# Patient Record
Sex: Male | Born: 1935 | Race: White | Hispanic: No | Smoking: Former smoker
Health system: Southern US, Community
[De-identification: ages and names within clinical notes are randomized; demographics above are authoritative.]

## PROBLEM LIST (undated history)

## (undated) DIAGNOSIS — I639 Cerebral infarction, unspecified: Secondary | ICD-10-CM

## (undated) DIAGNOSIS — I739 Peripheral vascular disease, unspecified: Secondary | ICD-10-CM

## (undated) DIAGNOSIS — J449 Chronic obstructive pulmonary disease, unspecified: Secondary | ICD-10-CM

## (undated) DIAGNOSIS — C801 Malignant (primary) neoplasm, unspecified: Secondary | ICD-10-CM

## (undated) DIAGNOSIS — N183 Chronic kidney disease, stage 3 unspecified: Secondary | ICD-10-CM

## (undated) DIAGNOSIS — K219 Gastro-esophageal reflux disease without esophagitis: Secondary | ICD-10-CM

## (undated) DIAGNOSIS — I701 Atherosclerosis of renal artery: Secondary | ICD-10-CM

## (undated) DIAGNOSIS — K449 Diaphragmatic hernia without obstruction or gangrene: Secondary | ICD-10-CM

## (undated) DIAGNOSIS — E785 Hyperlipidemia, unspecified: Secondary | ICD-10-CM

## (undated) DIAGNOSIS — I10 Essential (primary) hypertension: Secondary | ICD-10-CM

## (undated) DIAGNOSIS — Z8719 Personal history of other diseases of the digestive system: Secondary | ICD-10-CM

## (undated) DIAGNOSIS — M1990 Unspecified osteoarthritis, unspecified site: Secondary | ICD-10-CM

## (undated) DIAGNOSIS — I219 Acute myocardial infarction, unspecified: Secondary | ICD-10-CM

## (undated) HISTORY — DX: Peripheral vascular disease, unspecified: I73.9

## (undated) HISTORY — DX: Personal history of other diseases of the digestive system: Z87.19

## (undated) HISTORY — DX: Unspecified osteoarthritis, unspecified site: M19.90

## (undated) HISTORY — DX: Chronic obstructive pulmonary disease, unspecified: J44.9

## (undated) HISTORY — DX: Essential (primary) hypertension: I10

## (undated) HISTORY — DX: Diaphragmatic hernia without obstruction or gangrene: K44.9

## (undated) HISTORY — DX: Atherosclerosis of renal artery: I70.1

## (undated) HISTORY — DX: Gastro-esophageal reflux disease without esophagitis: K21.9

## (undated) HISTORY — DX: Hyperlipidemia, unspecified: E78.5

## (undated) HISTORY — DX: Cerebral infarction, unspecified: I63.9

## (undated) HISTORY — DX: Acute myocardial infarction, unspecified: I21.9

---

## 2000-02-19 ENCOUNTER — Encounter: Payer: Self-pay | Admitting: Cardiology

## 2000-02-19 ENCOUNTER — Ambulatory Visit (HOSPITAL_COMMUNITY): Admission: RE | Admit: 2000-02-19 | Discharge: 2000-02-19 | Payer: Self-pay | Admitting: Cardiology

## 2004-04-05 ENCOUNTER — Encounter: Admission: RE | Admit: 2004-04-05 | Discharge: 2004-04-05 | Payer: Self-pay | Admitting: Sports Medicine

## 2004-04-18 ENCOUNTER — Encounter: Admission: RE | Admit: 2004-04-18 | Discharge: 2004-04-18 | Payer: Self-pay | Admitting: Sports Medicine

## 2009-07-09 ENCOUNTER — Emergency Department (HOSPITAL_COMMUNITY): Admission: EM | Admit: 2009-07-09 | Discharge: 2009-07-09 | Payer: Self-pay | Admitting: Emergency Medicine

## 2009-07-09 ENCOUNTER — Inpatient Hospital Stay (HOSPITAL_COMMUNITY)
Admission: EM | Admit: 2009-07-09 | Discharge: 2009-07-14 | Payer: Self-pay | Source: Home / Self Care | Admitting: Emergency Medicine

## 2009-07-10 ENCOUNTER — Encounter: Payer: Self-pay | Admitting: Internal Medicine

## 2009-07-10 ENCOUNTER — Ambulatory Visit: Payer: Self-pay | Admitting: Internal Medicine

## 2009-08-14 ENCOUNTER — Ambulatory Visit: Payer: Self-pay | Admitting: Pulmonary Disease

## 2009-08-14 DIAGNOSIS — I4891 Unspecified atrial fibrillation: Secondary | ICD-10-CM | POA: Insufficient documentation

## 2009-08-14 DIAGNOSIS — I219 Acute myocardial infarction, unspecified: Secondary | ICD-10-CM | POA: Insufficient documentation

## 2009-08-14 DIAGNOSIS — I10 Essential (primary) hypertension: Secondary | ICD-10-CM | POA: Insufficient documentation

## 2009-08-14 DIAGNOSIS — E785 Hyperlipidemia, unspecified: Secondary | ICD-10-CM

## 2009-08-14 DIAGNOSIS — J449 Chronic obstructive pulmonary disease, unspecified: Secondary | ICD-10-CM | POA: Insufficient documentation

## 2009-08-14 DIAGNOSIS — Z87891 Personal history of nicotine dependence: Secondary | ICD-10-CM

## 2009-08-14 DIAGNOSIS — J4489 Other specified chronic obstructive pulmonary disease: Secondary | ICD-10-CM | POA: Insufficient documentation

## 2009-10-15 ENCOUNTER — Ambulatory Visit: Payer: Self-pay | Admitting: Pulmonary Disease

## 2010-03-18 ENCOUNTER — Ambulatory Visit
Admission: RE | Admit: 2010-03-18 | Discharge: 2010-03-18 | Payer: Self-pay | Source: Home / Self Care | Attending: Vascular Surgery | Admitting: Vascular Surgery

## 2010-03-28 ENCOUNTER — Ambulatory Visit
Admission: RE | Admit: 2010-03-28 | Discharge: 2010-03-28 | Payer: Self-pay | Source: Home / Self Care | Attending: Vascular Surgery | Admitting: Vascular Surgery

## 2010-03-29 NOTE — Assessment & Plan Note (Signed)
OFFICE VISIT  [name], Martin P DOB:  12-05-1930                                       03/28/2010 UVOZD#:[id number]  CHIEF COMPLAINT:  Leg pain.  HISTORY OF PRESENT ILLNESS:  The patient is a 77 year old male referred by Dr. Margaretha Sheffield for evaluation of lower extremity pain.  The patient states his legs hurt after walking approximately 10 yards.  The right leg is worse than the left.  This has been going on for 8 months to a year.  He states that the pain immediately resolves and improves with sitting for approximately 5 minutes.  He can then walk another 10 yards.  He was previously seen by Dr. Margaretha Sheffield for evaluation for possible lumbar spine cause of this problem.  He had an MRI of the spine which showed some lumbar degenerative disk disease but no foraminal or spinal stenosis.  He has had no episodes of nonhealing ulcers on his feet.  He has some component of early rest pain in his right foot and sometimes has to dangle the foot out of the bed to decrease pain at night time when he is sleeping.  He also has some decreased sensation intermittently in his right foot.  CHRONIC MEDICAL PROBLEMS:  Include hypertension, elevated cholesterol, coronary artery disease and what the patient described as "moderate COPD".  SOCIAL HISTORY:  He is a former smoker, quit in May of 2011.  In May of 2011 he also had coronary stenting by Dr. Sharyn Lull.  These were drug- eluting stents and he was placed on Effient at that time.  He continues to take Effient and aspirin currently.  He was thought to be a nonresponder of Plavix.  All of his chronic medical problems are currently stable and followed by Dr. Sharyn Lull.  PAST SURGICAL HISTORY:  None.  SOCIAL HISTORY:  He is retired.  He is widowed.  Smoking history as listed above.  He does not consume alcohol regularly.  FAMILY HISTORY:  Is not remarkable for vascular disease at age less than 1 or other significant medical  problems.  REVIEW OF SYSTEMS:  Full 12 point review of systems was performed with the patient today.  Please see intake referral form for details regarding this.  MEDICATIONS: 1. Include Symbicort 160/4.5. 2. Aspirin 81 mg once a day. 3. Metoprolol ER 25 mg once a day. 4. Effient 10 mg once a day. 5. Alprazolam 0.5 mg b.i.d. p.r.n. 6. Famotidine 20 mg twice a day. 7. Diltiazem 120 mg once a day. 8. Cozaar 50 mg once daily.  ALLERGIES:  He has an allergy listed to penicillin but does not remember the allergic reaction he had to this.  PHYSICAL EXAM:  Vital signs:  Blood pressure is 180/78 in the left arm, oxygen saturation is 99% on room air, heart rate 69 and regular.  HEENT: Unremarkable.  Neck:  Has 2+ carotid pulses with a left-sided faint carotid bruit.  Chest:  Clear to auscultation in the upper fields. However, there are coarse rhonchi bilaterally in the bases.  There is no audible wheeze.  Cardiac:  Regular rate and rhythm without murmur. Abdomen:  Soft, nontender, nondistended.  No masses.  Musculoskeletal: Showed no major obvious joint deformities.  Neurologic:  Shows slight decreased sensation to the plantar aspect of the right foot.  Skin:  Has no open ulcers or rashes.  Extremities:  He has 2+ radial pulses bilaterally.  He has 1+ femoral pulses bilaterally.  He has absent popliteal and pedal pulses bilaterally.  The right foot is slightly dusky in appearance.  There is capillary refill.  I reviewed his ABIs performed in our office on 03/18/2010 which showed absent Doppler flow to the right foot and an ABI of 0.40 to the left foot with monophasic waveforms.  In summary, the patient has severe lower extremity arterial occlusive disease and is at risk of limb loss bilaterally.  I believe the best option at this point would be to perform a diagnostic arteriogram with possible intervention with angioplasty and stenting with a focus on the right leg but also obtaining  images of the left lower extremity.  I spoke with the patient today and Dr. Sharyn Lull today regarding his Effient.  If we were able to do just a percutaneous intervention we would not need to make any medication changes.  However, if his arteriogram suggests that an open operation would be necessary then we will need to admit him a few days before the procedure to place him on Integrilin and I will have Dr. Sharyn Lull assist with this and then we would need to place him on Effient again after the operation.  This would all be due to the fact that he has a drug-eluting stent in his LAD that would put him at risk without these medications.  In addition, today the patient and his friend were informed of the risks, benefits, possible complications and procedure details of the aortogram with lower extremity runoff, possible angioplasty and stenting.  They understand and agree to proceed.  This is scheduled for 04/05/2010.    Janetta Hora. Fields, MD Electronically Signed  CEF/MEDQ  D:  03/28/2010  T:  03/29/2010  Job:  [id number]  cc:   Eduardo Osier. Sharyn Lull, M.D. Frazier Butt, D.O.

## 2010-04-05 ENCOUNTER — Ambulatory Visit (HOSPITAL_COMMUNITY)
Admission: RE | Admit: 2010-04-05 | Discharge: 2010-04-05 | Payer: Self-pay | Source: Home / Self Care | Attending: Vascular Surgery | Admitting: Vascular Surgery

## 2010-04-05 LAB — POCT I-STAT, CHEM 8
BUN: 19 mg/dL (ref 6–23)
Creatinine, Ser: 1.6 mg/dL — ABNORMAL HIGH (ref 0.4–1.5)
Glucose, Bld: 142 mg/dL — ABNORMAL HIGH (ref 70–99)
TCO2: 29 mmol/L (ref 0–100)

## 2010-04-05 NOTE — Procedures (Unsigned)
CAROTID DUPLEX EXAM  INDICATION:  Carotid bruit.  HISTORY: Diabetes:  Yes. Cardiac:  Myocardial infarction. Hypertension:  Yes. Smoking:  Previous. Previous Surgery:  No. CV History:  Patient is currently asymptomatic. Amaurosis Fugax No, Paresthesias No, Hemiparesis No.                                      RIGHT             LEFT Brachial systolic pressure:         166               168 Brachial Doppler waveforms:         WNL               WNL Vertebral direction of flow:        Antegrade         Antegrade DUPLEX VELOCITIES (cm/sec) CCA peak systolic                   76                69 ECA peak systolic                   85                161 ICA peak systolic                   92                152 ICA end diastolic                   19                18 PLAQUE MORPHOLOGY:                  Heterogenous      Heterogenous PLAQUE AMOUNT:                      Mild-to-moderate  Mild-to-moderate PLAQUE LOCATION:                    Bifurcation, proximal ICA           Bifurcation, proximal ICA, ECA  IMPRESSION: 1. The right side is not hemodynamically significant for stenosis. 2. The left side has 40% to 59% stenosis in the internal carotid     artery. 3. The left external carotid artery with stenosis. 4. Minimal bilateral intimal thickening within the common carotid     arteries.  ___________________________________________ Janetta Hora Fields, MD  OD/MEDQ  D:  03/28/2010  T:  03/28/2010  Job:  [id number]

## 2010-04-09 NOTE — Assessment & Plan Note (Signed)
Summary: rov 6-8 wks ///kp   Visit Type:  Follow-up Copy to:  Dr. Sharyn Lull Primary Provider/Referring Provider:  Rinaldo Cloud  CC:  COPD.  The patient says there is no change in his breathing since adding Spiriva. Occasional cough..  History of Present Illness: 77 yo male with GOLD 3 COPD.  His breathing has been doing okay.  He still has a cough with clear to green sputum.  He denies fever, sweats, chest pain, or hemoptysis.  He has not had wheeze or sinus congestion.  He has not smoked since his last hospitalization.  He has been using spiriva, but he is not sure how much this is helping.  He is worried about the side effects from spiriva.  He has not needed to use his proair much since he started symbicort.   Current Medications (verified): 1)  Aspirin 325 Mg Tabs (Aspirin) .... Once Daily 2)  Diltiazem Hcl 120 Mg Tabs (Diltiazem Hcl) .... Once Daily 3)  Metoprolol Succinate 25 Mg Xr24h-Tab (Metoprolol Succinate) .... Once Daily 4)  Losartan Potassium 50 Mg Tabs (Losartan Potassium) .... Once Daily 5)  Crestor 20 Mg Tabs (Rosuvastatin Calcium) .... Once Daily 6)  Pepcid 20 Mg Tabs (Famotidine) .... 2 By Mouth Daily 7)  Alprazolam 0.5 Mg Tabs (Alprazolam) .... 1/2 By Mouth At Bedtime 8)  Effient 10 Mg Tabs (Prasugrel Hcl) .... Once Daily 9)  Symbicort 160-4.5 Mcg/act Aero (Budesonide-Formoterol Fumarate) .... Two Puffs Two Times A Day 10)  Proair Hfa 108 (90 Base) Mcg/act Aers (Albuterol Sulfate) .... Two Puffs Up To Four Times Per Day As Needed 11)  Spiriva Handihaler 18 Mcg Caps (Tiotropium Bromide Monohydrate) .... One Puff Once Daily  Allergies (verified): 1)  ! * Pcn  Past History:  Past Medical History: Reviewed history from 08/14/2009 and no changes required. Coronary artery disease s/p MI Hypertension Grade 1 diastolic dysfunction Chronic obstructive pulmonary disease      - Spirometry 08/14/09 FEV1 1.16(46%), FEV1% 55 Diabetes mellitus, type  II Hyperlipidemia L4-L5 disk protrusion  Past Surgical History: Reviewed history from 08/14/2009 and no changes required. Cardiac catheterization with angioplasty and stenting May 2011  Vital Signs:  Patient profile:   77 year old male Height:      66 inches (167.64 cm) Weight:      157.50 pounds (71.59 kg) BMI:     25.51 O2 Sat:      93 % on Room air Temp:     97.9 degrees F (36.61 degrees C) oral Pulse rate:   67 / minute BP sitting:   124 / 78  (left arm) Cuff size:   regular  Vitals Entered By: Michel Bickers CMA (October 15, 2009 4:52 PM)  O2 Sat at Rest %:  93 O2 Flow:  Room air CC: COPD.  The patient says there is no change in his breathing since adding Spiriva. Occasional cough. Comments Medications reviewed. Daytime phone verified. Michel [facility]  October 15, 2009 4:53 PM   Physical Exam  General:  obese.   Nose:  no deformity, discharge, inflammation, or lesions Mouth:  no deformity or lesions Neck:  no JVD.   Lungs:  diminished breath sounds, no wheezing or rales Heart:  regular rhythm, normal rate, and no murmurs.   Extremities:  no clubbing, cyanosis, edema, or deformity noted Cervical Nodes:  no significant adenopathy   Impression & Recommendations:  Problem # 1:  COPD (ICD-496) He has been doing well.  He has not noticed any difference  since starting spiriva.  I have advised him to stop spiriva, and monitor his symptoms.  He is to continue symbicort and as needed proair.  I advised him to call if his breathing gets worse, or if he needs to use his rescue inhaler more.  Medications Added to Medication List This Visit: 1)  Alprazolam 0.5 Mg Tabs (Alprazolam) .... 1/2 by mouth at bedtime  Complete Medication List: 1)  Aspirin 325 Mg Tabs (Aspirin) .... Once daily 2)  Diltiazem Hcl 120 Mg Tabs (Diltiazem hcl) .... Once daily 3)  Metoprolol Succinate 25 Mg Xr24h-tab (Metoprolol succinate) .... Once daily 4)  Losartan Potassium 50 Mg Tabs (Losartan  potassium) .... Once daily 5)  Crestor 20 Mg Tabs (Rosuvastatin calcium) .... Once daily 6)  Pepcid 20 Mg Tabs (Famotidine) .... 2 by mouth daily 7)  Alprazolam 0.5 Mg Tabs (Alprazolam) .... 1/2 by mouth at bedtime 8)  Effient 10 Mg Tabs (Prasugrel hcl) .... Once daily 9)  Symbicort 160-4.5 Mcg/act Aero (Budesonide-formoterol fumarate) .... Two puffs two times a day 10)  Proair Hfa 108 (90 Base) Mcg/act Aers (Albuterol sulfate) .... Two puffs up to four times per day as needed  Other Orders: Est. Patient Level III (09811)  Patient Instructions: 1)  Stop spiriva 2)  Continue symbicort two puffs two times a day 3)  Continue proair two puffs up to four times per day as needed  4)  Follow up in 4 to 6 months

## 2010-04-09 NOTE — Assessment & Plan Note (Signed)
Summary: copd/jd   Visit Type:  Initial Consult Copy to:  Dr. Sharyn Lull Primary Provider/Referring Provider:  Rinaldo Cloud  CC:  Pulmonary consult.  Marland Kitchen  History of Present Illness: 77 yo male for evaluation of COPD.  He was hospitalized in May for dyspnea, and was found to have a heart attack.  He then had cardiac cath with stenting.  He was also told he had a COPD and had an exacerbation contributing to his dyspnea.  He was discharged on prednisone taper, and albuterol.  He has since completed his prednisone.  He has notice a problem with his breathing for years.  He has not seen a doctor in the past 10 o 15 years prior to his hospitalization in May.  He has an extensive history of smoking.  He started at age 20, smoked at least 1 pack per day, and quit the day he went to the hospital in May.  Prior to this he was getting frequent cough, wheeze, and clear sputum production.  This has improved some with inhaler therapy and smoking cessation.  He was using his proair frequently, and as a result was started on symbicort about two weeks ago.  This has helped some.  He has not had hemoptysis or fever.  There is no prior history of allergies, asthma, pneumonia, or TB.  He used to working in a [employer], and was exposed to chemicals in his work place.  He has a Development worker, international aid, but denies other animal exposures.  There is no travel history or sick exposures.     Preventive Screening-Counseling & Management  Alcohol-Tobacco     Alcohol drinks/day: 0     Smoking Status: quit     Year Quit: 07/2009     Pack years: 60 years x1 ppd  Current Medications (verified): 1)  Spiriva Handihaler 18 Mcg Caps (Tiotropium Bromide Monohydrate) .... Once Daily 2)  Aspirin 325 Mg Tabs (Aspirin) .... Once Daily 3)  Diltiazem Hcl 120 Mg Tabs (Diltiazem Hcl) .... Once Daily 4)  Metoprolol Succinate 25 Mg Xr24h-Tab (Metoprolol Succinate) .... Once Daily 5)  Pepcid 20 Mg Tabs (Famotidine) .... 2 By Mouth Daily 6)   Crestor 20 Mg Tabs (Rosuvastatin Calcium) .... Once Daily 7)  Alprazolam 0.5 Mg Tabs (Alprazolam) .Marland Kitchen.. 1 By Mouth Two Times A Day As Needed 8)  Losartan Potassium 50 Mg Tabs (Losartan Potassium) .... Once Daily 9)  Effient 10 Mg Tabs (Prasugrel Hcl) .... Once Daily 10)  Ventolin Hfa 108 (90 Base) Mcg/act  Aers (Albuterol Sulfate) .Marland Kitchen.. 1-2 Puffs Every 4-6 Hours As Needed  Allergies (verified): 1)  ! * Pcn  Past History:  Past Medical History: Coronary artery disease s/p MI Hypertension Grade 1 diastolic dysfunction Chronic obstructive pulmonary disease      - Spirometry 08/14/09 FEV1 1.16(46%), FEV1% 55 Diabetes mellitus, type II Hyperlipidemia L4-L5 disk protrusion  Past Surgical History: Cardiac catheterization with angioplasty and stenting May 2011  Echocardiogram  Procedure date:  07/10/2009  Findings:       Study Conclusions    - Left ventricle: The cavity size was normal. Systolic function was     normal. The estimated ejection fraction was in the range of 50% to     55%. Regional wall motion abnormalities cannot be excluded.     Doppler parameters are consistent with abnormal left ventricular     relaxation (grade 1 diastolic dysfunction).   - Atrial septum: No defect or patent foramen ovale was identified.  CXR  Procedure  date:  07/12/2009  Findings:       PORTABLE CHEST - 1 VIEW    Comparison: 07/09/2009    Findings: Heart size is mildly enlarged.    No pleural effusions are identified.    Kerley B lines are identified along the periphery of the left lung   which suggests interstitial edema.    No airspace consolidation is identified.    IMPRESSION:    1.  Suspect early interstitial edema.   2.  No pneumonia.  Cardiac Cath  Procedure date:  07/12/2009  Findings:       PROCEDURES:   1. Left cardiac catheterization with selective left and right coronary       angiography, left ventriculography via right groin using Judkins       technique.    2. Successful percutaneous transluminal coronary angioplasty to apical       and distal left anterior descending using 2.0 x 12 mm long Mini-       Trek balloon.   3. Successful percutaneous transluminal coronary angioplasty to       proximal diagonal 2 using 2.5 x 15 mm long Mini-Trek balloon.   4. Successful deployment of 2.5 x 23 mm long Promus drug-eluting stent       in proximal and ostial diagonal 2 using mini-crush technique.   5. Successful postdilatation of this stent using 2.5 x 15 mm long [place]       Voyager balloon.   6. Successful percutaneous transluminal coronary angioplasty to       proximal left anterior descending using 2.5 x 12 mm long Mini-Trek       balloon.   7. Successful deployment of 3.0 x 23 mm long Promus drug-eluting stent       in proximal left anterior descending .   8. Successful postdilatation of this stent using 3.0 x 20 mm long [place]       Voyager balloon.    Family History: Heart disease--mother  Social History: Quit smoking May 2011. Retired from [employer]--chemical exposure over the years Widowed Lives aloneAlcohol drinks/day:  0 Smoking Status:  quit Pack years:  60 years x1 ppd  Review of Systems       The patient complains of shortness of breath with activity, shortness of breath at rest, productive cough, irregular heartbeats, acid heartburn, indigestion, and nasal congestion/difficulty breathing through nose.  The patient denies non-productive cough, coughing up blood, chest pain, loss of appetite, weight change, abdominal pain, difficulty swallowing, sore throat, tooth/dental problems, headaches, sneezing, itching, ear ache, anxiety, depression, hand/feet swelling, joint stiffness or pain, rash, change in color of mucus, and fever.    Vital Signs:  Patient profile:   77 year old male Height:      66 inches (251.46 cm) Weight:      148 pounds (67.27 kg) BMI:     10.66 O2 Sat:      96 % on Room air Temp:     97.4 degrees F (36.33 degrees C)  oral Pulse rate:   69 / minute BP sitting:   136 / 64  (left arm) Cuff size:   regular  Vitals Entered By: Michel Bickers CMA (August 14, 2009 2:36 PM)  O2 Sat at Rest %:  96 O2 Flow:  Room air CC: Pulmonary consult.   Is Patient Diabetic? No   Physical Exam  General:  obese.   Eyes:  PERRLA and EOMI.   Nose:  no deformity, discharge, inflammation, or lesions  Mouth:  no deformity or lesions Neck:  no JVD.   Chest Wall:  no deformities noted Lungs:  diminished breath sounds, no wheezing or rales Heart:  regular rhythm, normal rate, and no murmurs.   Abdomen:  obese, soft, non-tender, normal bowel sounds Msk:  no deformity or scoliosis noted with normal posture Pulses:  pulses normal Extremities:  no clubbing, cyanosis, edema, or deformity noted Neurologic:  CN II-XII grossly intact with normal reflexes, coordination, muscle strength and tone Cervical Nodes:  no significant adenopathy Psych:  alert and cooperative; normal mood and affect; normal attention span and concentration   Pulmonary Function Test Date: 08/14/2009 3:19 PM Gender: Male  Pre-Spirometry FVC    Value: 2.11 L/min   % Pred: 60.20 % FEV1    Value: 1.16 L     Pred: 2.50 L     % Pred: 46.50 % FEV1/FVC  Value: 55.15 %     % Pred: 76.20 %  Comments: Severe obstruction  Impression & Recommendations:  Problem # 1:  COPD (ICD-496) He has an extensive history of smoking.  He has airflow obstruction on spirometry today.  He has felt partial improvement in his breathing from inhaler therapy.  I will continue him on symbicort and as needed albuterol.  I will add spiriva.  I don't think he needs additional prednisone or antibiotics at this time.  He has received his pneumonia shot, and does get annual flu vaccination.  I have advised him to consider enrolling in pulmonary rehab.  Problem # 2:  TOBACCO ABUSE (ICD-305.1)  He quit after his recent hospitalization.  I have encouraged him to maintain his smoking  abstinence.  Medications Added to Medication List This Visit: 1)  Spiriva Handihaler 18 Mcg Caps (Tiotropium bromide monohydrate) .... Once daily 2)  Ventolin Hfa 108 (90 Base) Mcg/act Aers (Albuterol sulfate) .Marland Kitchen.. 1-2 puffs every 4-6 hours as needed 3)  Aspirin 325 Mg Tabs (Aspirin) .... Once daily 4)  Diltiazem Hcl 120 Mg Tabs (Diltiazem hcl) .... Once daily 5)  Metoprolol Succinate 25 Mg Xr24h-tab (Metoprolol succinate) .... Once daily 6)  Losartan Potassium 50 Mg Tabs (Losartan potassium) .... Once daily 7)  Crestor 20 Mg Tabs (Rosuvastatin calcium) .... Once daily 8)  Pepcid 20 Mg Tabs (Famotidine) .... 2 by mouth daily 9)  Alprazolam 0.5 Mg Tabs (Alprazolam) .Marland Kitchen.. 1 by mouth two times a day as needed 10)  Effient 10 Mg Tabs (Prasugrel hcl) .... Once daily 11)  Symbicort 160-4.5 Mcg/act Aero (Budesonide-formoterol fumarate) .... Two puffs two times a day 12)  Proair Hfa 108 (90 Base) Mcg/act Aers (Albuterol sulfate) .... Two puffs up to four times per day as needed 13)  Spiriva Handihaler 18 Mcg Caps (Tiotropium bromide monohydrate) .... One puff once daily  Complete Medication List: 1)  Aspirin 325 Mg Tabs (Aspirin) .... Once daily 2)  Diltiazem Hcl 120 Mg Tabs (Diltiazem hcl) .... Once daily 3)  Metoprolol Succinate 25 Mg Xr24h-tab (Metoprolol succinate) .... Once daily 4)  Losartan Potassium 50 Mg Tabs (Losartan potassium) .... Once daily 5)  Crestor 20 Mg Tabs (Rosuvastatin calcium) .... Once daily 6)  Pepcid 20 Mg Tabs (Famotidine) .... 2 by mouth daily 7)  Alprazolam 0.5 Mg Tabs (Alprazolam) .Marland Kitchen.. 1 by mouth two times a day as needed 8)  Effient 10 Mg Tabs (Prasugrel hcl) .... Once daily 9)  Symbicort 160-4.5 Mcg/act Aero (Budesonide-formoterol fumarate) .... Two puffs two times a day 10)  Proair Hfa 108 (90 Base) Mcg/act Aers (Albuterol  sulfate) .... Two puffs up to four times per day as needed 11)  Spiriva Handihaler 18 Mcg Caps (Tiotropium bromide monohydrate) .... One puff  once daily  Other Orders: Consultation Level IV (16109) Prescription Created Electronically [phone]) Spirometry w/Graph [phone])  Patient Instructions: 1)  Symbicort two puffs two times a day 2)  Proair two puffs up to four times per day as needed for cough, wheeze, chest congestion, or shortness of breath 3)  Start spiriva one puff once daily  4)  Think about pulmonary rehab 5)  Follow up in 6 to 8 weeks Prescriptions: SPIRIVA HANDIHALER 18 MCG CAPS (TIOTROPIUM BROMIDE MONOHYDRATE) one puff once daily  #30 x 3   Entered and Authorized by:   Coralyn Helling MD   Signed by:   Coralyn Helling MD on 08/14/2009   Method used:   Electronically to        [facility]  [phone]* (retail)       [address]       [place], [place]  [address]       Ph: [phone] or [phone]       Fax: [phone]   RxID:   [id number]    Immunization History:  Influenza Immunization History:    Influenza:  historical (12/08/2008)  Pneumovax Immunization History:    Pneumovax:  historical (12/08/2008)    CardioPerfect Spirometry  ID: [id number] Patient: Street, Flor P DOB: Dec 19, 1930 Age: 78 Years Old Sex: Male Race: White Physician: Aria Pickrell Height: 66 Weight: 148 Status: Confirmed Recorded: 08/14/2009 3:19 PM  Parameter  Measured Predicted %Predicted FVC     2.11        3.50        60.20 FEV1     1.16        2.50        46.50 FEV1%   55.15        72.36        76.20 PEF    2.91        6.79        42.90   Interpretation: Pre: FVC= 2.11L FEV1= 1.16L FEV1%= 55.2% 1.16/2.11 FEV1/FVC (08/14/2009 3:22:23 PM), Severe obstruction.

## 2010-04-10 ENCOUNTER — Inpatient Hospital Stay (HOSPITAL_COMMUNITY): Payer: Medicare Other

## 2010-04-10 ENCOUNTER — Inpatient Hospital Stay (HOSPITAL_COMMUNITY)
Admission: RE | Admit: 2010-04-10 | Discharge: 2010-04-24 | DRG: 300 | Disposition: A | Discharge status: Home / Self Care | Payer: Medicare Other | Source: Ambulatory Visit | Attending: Vascular Surgery | Admitting: Vascular Surgery

## 2010-04-10 DIAGNOSIS — J40 Bronchitis, not specified as acute or chronic: Secondary | ICD-10-CM | POA: Diagnosis not present

## 2010-04-10 DIAGNOSIS — E785 Hyperlipidemia, unspecified: Secondary | ICD-10-CM | POA: Diagnosis present

## 2010-04-10 DIAGNOSIS — J4489 Other specified chronic obstructive pulmonary disease: Secondary | ICD-10-CM | POA: Diagnosis present

## 2010-04-10 DIAGNOSIS — I701 Atherosclerosis of renal artery: Secondary | ICD-10-CM | POA: Diagnosis present

## 2010-04-10 DIAGNOSIS — K449 Diaphragmatic hernia without obstruction or gangrene: Secondary | ICD-10-CM | POA: Diagnosis present

## 2010-04-10 DIAGNOSIS — K2270 Barrett's esophagus without dysplasia: Secondary | ICD-10-CM | POA: Diagnosis present

## 2010-04-10 DIAGNOSIS — E871 Hypo-osmolality and hyponatremia: Secondary | ICD-10-CM | POA: Diagnosis present

## 2010-04-10 DIAGNOSIS — D500 Iron deficiency anemia secondary to blood loss (chronic): Secondary | ICD-10-CM | POA: Diagnosis present

## 2010-04-10 DIAGNOSIS — D126 Benign neoplasm of colon, unspecified: Secondary | ICD-10-CM | POA: Diagnosis present

## 2010-04-10 DIAGNOSIS — E876 Hypokalemia: Secondary | ICD-10-CM | POA: Diagnosis present

## 2010-04-10 DIAGNOSIS — Z5309 Procedure and treatment not carried out because of other contraindication: Secondary | ICD-10-CM

## 2010-04-10 DIAGNOSIS — K922 Gastrointestinal hemorrhage, unspecified: Secondary | ICD-10-CM | POA: Diagnosis not present

## 2010-04-10 DIAGNOSIS — J449 Chronic obstructive pulmonary disease, unspecified: Secondary | ICD-10-CM | POA: Diagnosis present

## 2010-04-10 DIAGNOSIS — I7409 Other arterial embolism and thrombosis of abdominal aorta: Principal | ICD-10-CM | POA: Diagnosis present

## 2010-04-10 DIAGNOSIS — I252 Old myocardial infarction: Secondary | ICD-10-CM

## 2010-04-10 DIAGNOSIS — E119 Type 2 diabetes mellitus without complications: Secondary | ICD-10-CM | POA: Diagnosis present

## 2010-04-10 LAB — CBC
HCT: 42.7 % (ref 39.0–52.0)
Hemoglobin: 14 g/dL (ref 13.0–17.0)
MCH: 31 pg (ref 26.0–34.0)
MCV: 94.7 fL (ref 78.0–100.0)
Platelets: 191 10*3/uL (ref 150–400)
RDW: 13.4 % (ref 11.5–15.5)
WBC: 10.1 10*3/uL (ref 4.0–10.5)

## 2010-04-10 LAB — BASIC METABOLIC PANEL
CO2: 24 mEq/L (ref 19–32)
Chloride: 103 mEq/L (ref 96–112)
GFR calc non Af Amer: 50 mL/min — ABNORMAL LOW (ref >60)
Glucose, Bld: 107 mg/dL — ABNORMAL HIGH (ref 70–99)

## 2010-04-10 LAB — APTT: aPTT: 31 seconds (ref 24–37)

## 2010-04-10 LAB — PROTIME-INR: Prothrombin Time: 12.8 seconds (ref 11.6–15.2)

## 2010-04-11 DIAGNOSIS — I745 Embolism and thrombosis of iliac artery: Secondary | ICD-10-CM

## 2010-04-11 LAB — CBC
HCT: 37.9 % — ABNORMAL LOW (ref 39.0–52.0)
MCHC: 33.2 g/dL (ref 30.0–36.0)

## 2010-04-12 DIAGNOSIS — I745 Embolism and thrombosis of iliac artery: Secondary | ICD-10-CM

## 2010-04-12 LAB — CBC
Hemoglobin: 12.7 g/dL — ABNORMAL LOW (ref 13.0–17.0)
MCHC: 32.4 g/dL (ref 30.0–36.0)
MCV: 94 fL (ref 78.0–100.0)
Platelets: 165 10*3/uL (ref 150–400)
RBC: 4.17 MIL/uL — ABNORMAL LOW (ref 4.22–5.81)
RDW: 13.3 % (ref 11.5–15.5)

## 2010-04-12 NOTE — Op Note (Signed)
NAME:  Martin [name], Martin [name]                ACCOUNT NO.:  [id number]  MEDICAL RECORD NO.:  [id number]          PATIENT TYPE:  AMB  LOCATION:  SDS                          FACILITY:  [facility]  PHYSICIAN:  Charles E. Fields, MD  DATE OF BIRTH:  1930-06-05  DATE OF PROCEDURE:  04/05/2010 DATE OF DISCHARGE:  04/05/2010                              OPERATIVE REPORT   PROCEDURE:  Aortogram with bilateral lower extremity runoff.  PREOPERATIVE DIAGNOSIS:  Rest pain, right foot.  POSTOPERATIVE DIAGNOSIS:  Rest pain, right foot.  SURGEON:  Janetta Hora. Fields, MD  ANESTHESIA:  Local.  OPERATIVE DETAILS:  After obtaining informed consent, the patient was taken to the [facility] lab.  The patient was placed in supine position on the angio table.  Both groins were prepped and draped in usual sterile fashion.  Local anesthesia was infiltrated over the left common femoral artery.  An introducer needle was used to cannulate the left common femoral artery and a 0.035 Versacore wire threaded in the abdominal aorta under fluoroscopic guidance.  Next, a 5-French sheath was placed over the guidewire in the left common femoral artery.  This was thoroughly flushed with heparinized saline.  A 5-French pigtail catheter was then placed over the guidewire in the abdominal aorta.  Abdominal aortogram was obtained.  This then shows an 80% stenosis of the left renal artery just after the origin.  There was a 50% long tubular stenosis of the right renal artery.  Infrarenal abdominal aorta is irregular and atherosclerotic but without significant calcification and no flow-limiting stenosis.  In the left leg, the left common iliac artery has a subtotal occlusion just after its origin.  There is multilevel high-grade stenoses throughout the entire distal left common iliac as well as the left external iliac artery.  The left internal iliac artery is patent.  The distal left external iliac artery is patent as well as the left  common femoral artery.  On the right side, the right common iliac artery is patent proximally and then diffusely tapers down with a subtotal occlusion of the right external iliac artery.  The right internal iliac artery is patent.  The external iliac artery reconstitutes just above the femoral head.  Next, bilateral lower extremity runoff views were obtained.  In the right lower extremity, the right common femoral artery is patent. The right profunda femoris artery is patent.  The right superficial femoral artery is occluded.  The above-knee popliteal artery reconstitutes via collaterals from the profunda but fills poor distally. The below-knee popliteal artery is a much healthier vessel and there is three-vessel runoff to the right foot.  In the left lower extremity, the left common femoral artery is patent. The left superficial femoral artery is occluded at its origin.  The left profunda femoris artery is patent.  Profunda collaterals reconstitute the above-knee popliteal artery on the left.  The below-knee popliteal artery is widely patent.  There is three-vessel runoff to the left foot.  The patient tolerated the procedure well.  There were no complications. A 5-French pigtail catheter was removed over guidewire and the 5-French sheath thoroughly flushed with heparinized  saline to be removed in the holding area.  OPERATIVE FINDINGS: 1. Severe iliac occlusive disease of the common and external iliac     arteries bilaterally. 2. Right superficial femoral artery occlusion with reconstitution of     the above-knee popliteal artery but this was a diseased segment and     the below-knee popliteal artery is a nicer quality vessel for     possible bypass grafting.  Three-vessel runoff to the right foot. 3. Left superficial femoral artery occlusion with reconstitution of     the above-knee popliteal artery.  Three-vessel runoff to the left     foot.  The patient will be scheduled  for an aortobifemoral bypass in the near future after he has been off his Effient for 10 days.     Janetta Hora. Fields, MD     CEF/MEDQ  D:  04/05/2010  T:  04/06/2010  Job:  [id number]  Electronically Signed by Fabienne Bruns MD on 04/12/2010 01:10:31 PM

## 2010-04-12 NOTE — H&P (Signed)
  NAME:  Martin [name], Martin [name]                ACCOUNT NO.:  [id number]  MEDICAL RECORD NO.:  [id number]           PATIENT TYPE:  LOCATION:                                 FACILITY:  PHYSICIAN:  Nishawn Rotan E. Anniebell Bedore, MD  DATE OF BIRTH:  12-17-1930  DATE OF ADMISSION: DATE OF DISCHARGE:                             HISTORY & PHYSICAL   ADDENDUM:  This is an addendum to his previous office note from March 28, 2010, in [place].  Martin [name] has aortoiliac occlusive disease with a left renal artery stenosis and bilateral superficial femoral artery occlusions.  This was determined on an arteriogram dated April 05, 2010.  He is being admitted to the hospital today since he is currently on Effient and has a recent LAD drug-eluding stent.  This was placed in May 2011.  Under the recommendation of Dr. Sharyn Lull, we have stopped his Effient on April 05, 2010, and we are admitting him today to place him on Integrilin until his Effient has completely gone away after 10 days of being off medication.  He will be on the Integrilin for protection of his stent while he is waiting to have an aortobifemoral bypass performed on April 15, 2010.  For details regarding his medications and the remainder of his history and physical, please refer to my office note from March 28, 2010.     Janetta Hora. Marda Breidenbach, MD     CEF/MEDQ  D:  04/05/2010  T:  04/06/2010  Job:  [id number]  Electronically Signed by Fabienne Bruns MD on 04/12/2010 01:10:43 PM

## 2010-04-13 DIAGNOSIS — I745 Embolism and thrombosis of iliac artery: Secondary | ICD-10-CM

## 2010-04-13 LAB — CBC
HCT: 38.3 % — ABNORMAL LOW (ref 39.0–52.0)
Hemoglobin: 12.6 g/dL — ABNORMAL LOW (ref 13.0–17.0)
MCV: 93.6 fL (ref 78.0–100.0)

## 2010-04-13 LAB — HEMOCCULT GUIAC POC 1CARD (OFFICE): Fecal Occult Bld: POSITIVE

## 2010-04-14 DIAGNOSIS — I745 Embolism and thrombosis of iliac artery: Secondary | ICD-10-CM

## 2010-04-14 LAB — ABO/RH: ABO/RH(D): O POS

## 2010-04-14 LAB — CBC
MCH: 30.5 pg (ref 26.0–34.0)
RBC: 3.67 MIL/uL — ABNORMAL LOW (ref 4.22–5.81)
WBC: 7.6 10*3/uL (ref 4.0–10.5)

## 2010-04-15 DIAGNOSIS — I745 Embolism and thrombosis of iliac artery: Secondary | ICD-10-CM

## 2010-04-15 LAB — CBC
MCHC: 31.8 g/dL (ref 30.0–36.0)
Platelets: 179 10*3/uL (ref 150–400)
RBC: 3.86 MIL/uL — ABNORMAL LOW (ref 4.22–5.81)
RDW: 13.4 % (ref 11.5–15.5)
WBC: 12.1 10*3/uL — ABNORMAL HIGH (ref 4.0–10.5)

## 2010-04-15 LAB — BASIC METABOLIC PANEL
CO2: 23 mEq/L (ref 19–32)
Calcium: 9.1 mg/dL (ref 8.4–10.5)
GFR calc Af Amer: 55 mL/min — ABNORMAL LOW (ref >60)
GFR calc non Af Amer: 45 mL/min — ABNORMAL LOW (ref >60)
Sodium: 138 mEq/L (ref 135–145)

## 2010-04-16 ENCOUNTER — Other Ambulatory Visit: Payer: Self-pay | Admitting: Gastroenterology

## 2010-04-16 DIAGNOSIS — I745 Embolism and thrombosis of iliac artery: Secondary | ICD-10-CM

## 2010-04-16 LAB — BASIC METABOLIC PANEL
CO2: 24 mEq/L (ref 19–32)
Calcium: 8.8 mg/dL (ref 8.4–10.5)
GFR calc non Af Amer: 49 mL/min — ABNORMAL LOW (ref >60)
Glucose, Bld: 124 mg/dL — ABNORMAL HIGH (ref 70–99)
Sodium: 142 mEq/L (ref 135–145)

## 2010-04-16 LAB — CBC
HCT: 34.3 % — ABNORMAL LOW (ref 39.0–52.0)
MCH: 31.1 pg (ref 26.0–34.0)
WBC: 6.8 10*3/uL (ref 4.0–10.5)

## 2010-04-16 LAB — HM COLONOSCOPY: HM Colonoscopy: NORMAL

## 2010-04-17 DIAGNOSIS — I745 Embolism and thrombosis of iliac artery: Secondary | ICD-10-CM

## 2010-04-17 LAB — CROSSMATCH
Antibody Screen: NEGATIVE
Unit division: 0

## 2010-04-17 LAB — CBC
HCT: 32.2 % — ABNORMAL LOW (ref 39.0–52.0)
MCH: 30.4 pg (ref 26.0–34.0)
MCV: 94.2 fL (ref 78.0–100.0)
Platelets: 166 10*3/uL (ref 150–400)
RBC: 3.42 MIL/uL — ABNORMAL LOW (ref 4.22–5.81)
WBC: 7.3 10*3/uL (ref 4.0–10.5)

## 2010-04-17 LAB — BASIC METABOLIC PANEL
BUN: 23 mg/dL (ref 6–23)
GFR calc Af Amer: 54 mL/min — ABNORMAL LOW (ref >60)
Sodium: 140 mEq/L (ref 135–145)

## 2010-04-18 LAB — CBC
HCT: 34.3 % — ABNORMAL LOW (ref 39.0–52.0)
Hemoglobin: 11.4 g/dL — ABNORMAL LOW (ref 13.0–17.0)
MCHC: 33.2 g/dL (ref 30.0–36.0)
MCV: 94 fL (ref 78.0–100.0)
RDW: 13.4 % (ref 11.5–15.5)
WBC: 7.7 10*3/uL (ref 4.0–10.5)

## 2010-04-19 DIAGNOSIS — I745 Embolism and thrombosis of iliac artery: Secondary | ICD-10-CM

## 2010-04-19 LAB — CBC
Hemoglobin: 10.9 g/dL — ABNORMAL LOW (ref 13.0–17.0)
MCH: 30.7 pg (ref 26.0–34.0)
MCHC: 33 g/dL (ref 30.0–36.0)
MCV: 93 fL (ref 78.0–100.0)

## 2010-04-20 DIAGNOSIS — I745 Embolism and thrombosis of iliac artery: Secondary | ICD-10-CM

## 2010-04-20 LAB — CBC
HCT: 34.1 % — ABNORMAL LOW (ref 39.0–52.0)
MCHC: 33.1 g/dL (ref 30.0–36.0)
MCV: 93.9 fL (ref 78.0–100.0)
Platelets: 173 10*3/uL (ref 150–400)
RDW: 13.5 % (ref 11.5–15.5)
WBC: 6.3 10*3/uL (ref 4.0–10.5)

## 2010-04-21 DIAGNOSIS — I745 Embolism and thrombosis of iliac artery: Secondary | ICD-10-CM

## 2010-04-21 LAB — CBC
Hemoglobin: 11.1 g/dL — ABNORMAL LOW (ref 13.0–17.0)
MCH: 30.8 pg (ref 26.0–34.0)
MCHC: 33.3 g/dL (ref 30.0–36.0)
Platelets: 174 10*3/uL (ref 150–400)
RDW: 13.5 % (ref 11.5–15.5)

## 2010-04-22 ENCOUNTER — Inpatient Hospital Stay (HOSPITAL_COMMUNITY): Payer: Medicare Other

## 2010-04-22 DIAGNOSIS — I745 Embolism and thrombosis of iliac artery: Secondary | ICD-10-CM

## 2010-04-22 LAB — URINALYSIS, ROUTINE W REFLEX MICROSCOPIC
Bilirubin Urine: NEGATIVE
Leukocytes, UA: NEGATIVE
Nitrite: NEGATIVE
Specific Gravity, Urine: 1.013 (ref 1.005–1.030)
Urobilinogen, UA: 0.2 mg/dL (ref 0.0–1.0)
pH: 5.5 (ref 5.0–8.0)

## 2010-04-22 LAB — CBC
MCV: 92.4 fL (ref 78.0–100.0)
Platelets: 144 10*3/uL — ABNORMAL LOW (ref 150–400)
RBC: 3.57 MIL/uL — ABNORMAL LOW (ref 4.22–5.81)
RDW: 13.7 % (ref 11.5–15.5)
WBC: 7.4 10*3/uL (ref 4.0–10.5)

## 2010-04-22 LAB — URINE MICROSCOPIC-ADD ON

## 2010-04-23 ENCOUNTER — Inpatient Hospital Stay (HOSPITAL_COMMUNITY): Payer: Medicare Other

## 2010-04-23 DIAGNOSIS — I745 Embolism and thrombosis of iliac artery: Secondary | ICD-10-CM

## 2010-04-23 LAB — CBC
HCT: 29 % — ABNORMAL LOW (ref 39.0–52.0)
MCHC: 34.5 g/dL (ref 30.0–36.0)
Platelets: 122 10*3/uL — ABNORMAL LOW (ref 150–400)
RDW: 13.6 % (ref 11.5–15.5)
WBC: 6.7 10*3/uL (ref 4.0–10.5)

## 2010-04-23 LAB — BASIC METABOLIC PANEL
BUN: 28 mg/dL — ABNORMAL HIGH (ref 6–23)
Calcium: 7.5 mg/dL — ABNORMAL LOW (ref 8.4–10.5)
GFR calc non Af Amer: 29 mL/min — ABNORMAL LOW (ref >60)
Glucose, Bld: 181 mg/dL — ABNORMAL HIGH (ref 70–99)
Potassium: 3.4 mEq/L — ABNORMAL LOW (ref 3.5–5.1)
Sodium: 124 mEq/L — ABNORMAL LOW (ref 135–145)

## 2010-04-23 LAB — URINE CULTURE

## 2010-04-24 LAB — BASIC METABOLIC PANEL
BUN: 26 mg/dL — ABNORMAL HIGH (ref 6–23)
CO2: 21 mEq/L (ref 19–32)
Chloride: 99 mEq/L (ref 96–112)
Creatinine, Ser: 1.94 mg/dL — ABNORMAL HIGH (ref 0.4–1.5)
Glucose, Bld: 150 mg/dL — ABNORMAL HIGH (ref 70–99)

## 2010-04-24 LAB — CBC
Hemoglobin: 9.4 g/dL — ABNORMAL LOW (ref 13.0–17.0)
MCH: 30.4 pg (ref 26.0–34.0)
MCHC: 34.4 g/dL (ref 30.0–36.0)
MCV: 88.3 fL (ref 78.0–100.0)
RBC: 3.09 MIL/uL — ABNORMAL LOW (ref 4.22–5.81)

## 2010-04-25 ENCOUNTER — Telehealth: Payer: Self-pay | Admitting: Pulmonary Disease

## 2010-04-25 NOTE — Discharge Summary (Addendum)
  NAME:  [name], Martin                ACCOUNT NO.:  [id number]  MEDICAL RECORD NO.:  [id number]           PATIENT TYPE:  I  LOCATION:  2007                         FACILITY:  [facility]  PHYSICIAN:  Janetta Hora. Deborra Phegley, MD  DATE OF BIRTH:  04/28/1930  DATE OF ADMISSION:  04/10/2010 DATE OF DISCHARGE:  04/24/2010                              DISCHARGE SUMMARY   ADDENDUM:  The patient is discharged today on April 24, 2010.  There are no changes in his medications.  Discharge medications, he will continue on Zithromax for 4 more days.  He remained afebrile and vital signs were stable.  Repeat CBCs, white count of 3.2.  His hemoglobin is 9.4, hematocrit at 27.3 which is stable.  His BUN and creatinine were 26 and 1.94 which is improved.  His urine culture had multiple colonies which was inconclusive.  His blood cultures were negative and he was discharged to home.  He will follow up with Dr. Darrick Penna in several weeks to decide when the aortobifemoral be rescheduled.  He has a runoff to the right foot, left superficial femoral artery occlusion with reconstitution of the above-knee popliteal artery with 3-vessel runoff to the left foot.  It was felt the patient should be as admitted for an aortobifemoral bypass when he was off his Effient for 10 days.  PAST MEDICAL HISTORY: 1. Hypertension. 2. Elevated cholesterol. 3. Coronary artery disease. 4. Moderate COPD.  He is a former smoker and has had coronary stenting with drug-eluting stents.  The patient was placed on Effient at that time.  The patient was admitted to the hospital on April 10, 2010 and his Effient was stopped and he was started on Integrilin with the intent of having surgery.  The patient then developed maroon stools and was seen by GI Service and had upper endoscopy biopsies.  He was found to have possible Barrett's mucosa, small hiatal hernia, and few mucosal hemorrhages in the stomach.  The patient continued on  DICTATION  ENDS AT THIS POINT     Della Goo, PA-C   ______________________________ Janetta Hora. Willis Kuipers, MD    RR/MEDQ  D:  04/24/2010  T:  04/24/2010  Job:  [id number]  Electronically Signed by Della Goo PA on 04/25/2010 02:21:11 PM Electronically Signed by Fabienne Bruns MD on 05/01/2010 03:07:42 PM

## 2010-04-26 ENCOUNTER — Ambulatory Visit: Payer: PRIVATE HEALTH INSURANCE | Admitting: Pulmonary Disease

## 2010-04-28 DIAGNOSIS — I745 Embolism and thrombosis of iliac artery: Secondary | ICD-10-CM

## 2010-04-28 LAB — CULTURE, BLOOD (ROUTINE X 2)
Culture  Setup Time: 201202131045
Culture  Setup Time: 201202131046

## 2010-05-01 NOTE — Discharge Summary (Signed)
NAME:  Martin [name], Martin [name]                ACCOUNT NO.:  [id number]  MEDICAL RECORD NO.:  [id number]           PATIENT TYPE:  I  LOCATION:  2007                         FACILITY:  [facility]  PHYSICIAN:  Janetta Hora. Yamilett Anastos, MD  DATE OF BIRTH:  18-Jun-1930  DATE OF ADMISSION:  04/10/2010 DATE OF DISCHARGE:                              DISCHARGE SUMMARY   ADMIT DIAGNOSIS:  Aortoiliac occlusive disease.  PAST MEDICAL HISTORY AND DISCHARGE DIAGNOSES: 1. Aortoiliac occlusive disease. 2. Coronary artery disease, status post remote myocardial infarction     and drug-eluting stent placement on May 2011, by Dr. Sharyn Lull on     Effient. 3. History of chronic obstructive pulmonary disease. 4. History of peripheral vascular disease. 5. Type 2 diabetes mellitus. 6. Hyperlipidemia. 7. History of tobacco abuse. 8. Maroon stools while on Integrilin, status post     esophagogastroduodenoscopy and colonoscopy with no significant     findings. 9. Bronchitis 10. Renal artery stenosis  ALLERGIES: 1. PENICILLIN. 2. TRAMADOL.  BRIEF HISTORY:  The patient is a 77 year old male who has been followed by Dr. Darrick Penna as an outpatient for aortoiliac occlusive disease that was found with extensive workup including arteriography.  He was also noted to have renal artery stenosis.  The patient has a drug-eluting stent and has been  on Effient per Dr. Sharyn Lull.  The patient was in need of an aortobifemoral bypass  graft and possible renal artery bypass.  Secondary to this, the patient was admitted to the hospital on April 06, 2010 for his Effient to be discontinued and started on IV Integrilin.  The surgery was scheduled for 10 days after Effient had been discontinued.  HOSPITAL COURSE:  The patient was admitted on April 06, 2010, as previously stated for conversion of Effient to Integrilin in anticipation  of aortobifemoral bypass grafting.  The patient's hospital course proceeded as expected initially and he was  doing well.  On April 13, 2010, the patient began to note bloody stools.  This continued through the following day and although his hemoglobin and hematocrit remained stable, a GI consult was obtained to identify possible source.  His surgery was cancelled.  A GI consult was obtained and they evaluated the patient on April 16, 2010.  An upper endoscopy with biopsies as well as a colonoscopy were performed.  Findings included no source of recent hematochezia identified, possible minimal Barrett's esophagus, widely patent Schatzkis ring, and a small hiatal hernia.  It was Dr. Donavan Burnet recommendation that the patient could proceed with Integrilin and this was restarted.  The patient had decided to stay in the hospital after the Integrilin was restarted in order to attempt to have his aortobifemoral bypass by Dr. Darrick Penna on April 24, 2010. He continued to do well with no complaints and no further maroon stools. However, on April 22, 2010, the patient began to have elevated temperatures of up to 102.  He has a chronic cough that was not changed. A UA was negative and chest x-rays showed chronic bronchitis.  The patient then decided that he felt he had bronchitis and did not feel well and did not wish  to proceed with aortobifem.  The patient was started on Zithromax, which he will need to complete a 5-day course.  He continued to have fevers  and was then noted to have an elevated Bun/Cr, hyponatremia, and hypokalemia.   he was felt to be dehydrated therefore his IV fluids were restarted, potassium replaced, and  instructed to increase his po intake.  Integrilin was discontinued and he was restarted on his  Effient.  On 04/24/10 he was evaled by Dr. Darrick Penna and felt stable for discharge home.  LABORATORY DATA:  CBC on April 22, 2010, white count 7.4, hemoglobin 7.9, hematocrit 33, platelets 144.  BMP on April 17, 2010, sodium 140, potassium 4.3, BUN 23, creatinine 1.51.  DISCHARGE  INSTRUCTIONS:  The patient received specific written discharge instructions and he is to follow up with Dr. Darrick Penna in approximately 2-4 weeks.  He will be contacted by our office with the date and time of that appointment.    DISCHARGE MEDICATIONS: 1. Zithromax 250 mg 1 p.o. daily x5 days. 2. Roxicodone 5 mg 1-2 p.o. q.4-6 hours p.r.n. pain. 3. Alprazolam 0.5 mg daily. 4. Aspirin 81 mg daily. 5. Diltiazem CD 120 mg daily. 6. Effient 10 mg daily. 7. Fish oil OTC daily. 8. Garlic extract OTC daily. 9. Losartan 50 mg daily. 10.Toprol XL 25 mg daily. 11.Multivitamin daily. 12.Pepcid 20 mg daily. 13.Symbicort 160/4.5 mcg 1 puff b.i.d. 14.Vitamin B12 daily.     Pecola Leisure, PA   ______________________________ Janetta Hora Naylah Cork, MD    AY/MEDQ  D:  04/22/2010  T:  04/23/2010  Job:  [id number]  Electronically Signed by Pecola Leisure PA on 04/24/2010 02:29:10 PM Electronically Signed by Fabienne Bruns MD on 05/01/2010 03:07:38 PM

## 2010-05-01 NOTE — Progress Notes (Signed)
Summary: bronchitis  Phone Note Call from Patient   Caller: grand-daughter-rhonda [phone] Call For: Mehul Rudin Summary of Call: pt just released from hosp has bronchitis aortic bypass has been delayed due to pt being sick  Initial call taken by: Lacinda Axon,  April 25, 2010 8:05 AM  Follow-up for Phone Call        Spoke with pt daughter and she states the pt was discharged from hospital yesterday with diagnosis of bronchitis. Pt set to see Dr. Craige Cotta tomorrow at 4:30 because daughter states he is still SOB and does not feel like he is gettin gbetter fast enough. She stes she feels he is ok to wait until appt tomorrow. he is on abx from discharge.Carron Curie CMA  April 25, 2010 10:17 AM

## 2010-05-07 ENCOUNTER — Inpatient Hospital Stay (HOSPITAL_COMMUNITY): Admission: RE | Admit: 2010-05-07 | Payer: Medicare Other | Source: Ambulatory Visit | Admitting: Vascular Surgery

## 2010-05-09 NOTE — Op Note (Signed)
  NAME:  Martin [name], Martin [name]                ACCOUNT NO.:  [id number]  MEDICAL RECORD NO.:  [id number]           PATIENT TYPE:  I  LOCATION:  2007                         FACILITY:  [facility]  PHYSICIAN:  Bernette Redbird, M.D.   DATE OF BIRTH:  Jan 22, 1931  DATE OF PROCEDURE:  04/16/2010 DATE OF DISCHARGE:                              OPERATIVE REPORT   PROCEDURE:  Upper endoscopy with biopsies.  INDICATIONS:  A 77 year old gentleman awaiting aortobifemoral bypass surgery, transitioned from Effient to Integrilin in the hospital in anticipation of that surgery, but developed maroon stools while on Integrilin, which has therefore been stopped.  Endoscopic and colonoscopic evaluation has been requested prior to reinitiation of some sort of anticoagulation in this patient with previous coronary stent placement.  FINDINGS:  Essentially normal exam.  Minimal tongues of possible Barrett's mucosa, small hiatal hernia with Schatzki's ring, and a few mucosal hemorrhages in the stomach.  PROCEDURE:  The nature, purpose, and risks of the procedure had been discussed with the patient who provided written consent, and was brought from his hospital room in a fasted state to the endoscopy unit. Sedation for this procedure was fentanyl 50 mcg and Versed 3 mg IV without desaturation, arrhythmias, or clinical instability.  The Pentax video endoscope was passed under direct vision.  The larynx was grossly normal.  The esophagus was entered and was normal except right the GE junction where there were a couple of minimal tongues of Barrett's appearing mucosa extending perhaps 5 to 7 mm above what was thought to be the lower esophageal sphincter, where there was a widely patent Schatzki's ring present.  These tongues were biopsied at the end of the procedure, for a total of approximately 4 biopsies, attempting to do fairly superficial biopsies, and confirming hemostasis thereafter.  No esophageal inflammation or  varices were noted.  The stomach was entered. It contained no blood or coffee-ground material, although a few adherent mucosal hemorrhages were noted in the body of the stomach.  Although, the patient is on aspirin, no erosions or ulcers or inflammation were observed in the stomach and a retroflexed view of the cardia was unremarkable apart from slightly patulous diaphragmatic hiatus consistent with his hiatal hernia.  No tumors or vascular ectasia were noted.  The pylorus, duodenal bulb, and second duodenum looked normal.  The patient tolerated the procedure well and there were no apparent complications.  IMPRESSION: 1. No source of recent hematochezia identified. 2. Possible minimal Barrett's esophagus. 3. Widely patent Schatzki's ring. 4. Small hiatal hernia.  PLAN: 1. Await pathology results. 2. No endoscopic contraindication to resumption of anticoagulation     identified, although would be ideal to wait 1 to 2 days before     doing aggressive anticoagulation in view of today's biopsies.          ______________________________ Bernette Redbird, M.D.     RB/MEDQ  D:  04/16/2010  T:  04/17/2010  Job:  [id number]  cc:   Janetta Hora. Darrick Penna, MD Eduardo Osier. Sharyn Lull, M.D.  Electronically Signed by Bernette Redbird M.D. on 05/09/2010 07:57:38 AM

## 2010-05-09 NOTE — Consult Note (Signed)
NAME:  Martin [name], Martin [name]                ACCOUNT NO.:  [id number]  MEDICAL RECORD NO.:  [id number]           PATIENT TYPE:  I  LOCATION:  2007                         FACILITY:  [facility]  PHYSICIAN:  Bernette Redbird, M.D.   DATE OF BIRTH:  1931-01-26  DATE OF CONSULTATION:  04/15/2010 DATE OF DISCHARGE:                                CONSULTATION   Dr. Darrick Penna asked [place] to see this 77 year old gentleman because of maroon stools.  The patient has a remote history of stomach ulcers about 25 years ago, but has no prior history of GI bleeding.  He is on a daily baby aspirin and, prior to this admission about 5 days ago, was also on Effient because of coronary stents placed almost a year ago, Perm-A-Cath placed 9 months ago.  The patient has been found to have an aortoiliac vascular occlusive disease and is anticipated to undergo aortobifemoral bypass surgery.  Toward that end, he was brought into the hospital and taken office his Effient with the intent of having him off that medication for about 10 days prior to surgery.  In the meantime, he was placed on an Integrilin infusion.  Beginning about 36 hours ago, he had a maroon stool and then further dark bloody stools, without hemodynamic instability and with just a modest drop in hemoglobin from 14.0 to 11.5.  During this period of time, his BUN did jump up from 20-40 with his creatinine going from approximately 1.4-1.5.  The patient apparently had endoscopy many many years ago at the time his gastric ulcers were diagnosed but it does not sound to me like he has had previous colonoscopy, and certainly not in the past 10 years.  PAST MEDICAL HISTORY:  Coronary artery disease status post remote MI and drug eluting stent placement a year ago, history of COPD, history of peripheral vascular disease, also recently diagnosed type 2 diabetes and non-ST-segment elevation MI based on enzymes a year ago.  OPERATIONS:  No prior abdominal surgery to my  knowledge.  ALLERGIES:  PENICILLINS, TRAMADOL.  CURRENT MEDICATIONS IN HOSPITAL: 1. Aspirin 81 mg daily. 2. Symbicort inhaler daily. 3. Pepcid 20 b.i.d. 4. Diltiazem 120 mg daily. 5. Metoprolol 25 mg daily. 6. Cozaar 50 mg daily.  HABITS:  The patient, I believes, is still a smoker.  REVIEW OF SYSTEMS:  Negative for significant GI tract symptoms such as dysphagia, anorexia, abdominal pain, nausea, vomiting, constipation, diarrhea or rectal bleeding prior to this admission.  PHYSICAL EXAMINATION:  GENERAL:  An overweight, stout, fully ambulatory, pleasant, fairly healthy-appearing elderly Caucasian male in no acute distress whatsoever. HEENT:  Anicteric, no frank pallor. CHEST:  Has reasonably good vesicular breath sounds, no rales or wheezes appreciated. HEART:  Normal, no murmurs or arrhythmias. ABDOMEN:  Rather rotund adipose but without overt tenderness.  Labs, see HPI.  White count today 12,100 which is slightly up from what it has been earlier in the hospitalization.  INR 1.0, fecal occult blood test positive.  IMPRESSION: 1. Transient mild gastrointestinal bleeding, most likely of upper     tract origin given rise in BUN and exposure to aspirin, occurred  while on Integrilin, no prior history of gastrointestinal bleeding. 2. Posthemorrhagic anemia, mild. 3. Remote possible history of gastric ulcers according to the patient,     records not available. 4. Medical illnesses including coronary artery disease, peripheral     vascular disease, and chronic obstructive pulmonary disease.  DISCUSSION AND PLAN:  Dr. Darrick Penna has requested, and I agree, that this patient undergo endoscopic and colonoscopic evaluation to try to determine the absence of any significant or unstable GI tract pathology that would put him at risk for clinically significant bleeding following aortobifemoral bypass surgery.  I have discussed the nature, purpose, and risks of these procedures with  the patient and he is agreeable. They will be arranged for tomorrow.          ______________________________ Bernette Redbird, M.D.     RB/MEDQ  D:  04/15/2010  T:  04/16/2010  Job:  [id number]  cc:   Janetta Hora. Darrick Penna, MD Eduardo Osier. Sharyn Lull, M.D.  Electronically Signed by Bernette Redbird M.D. on 05/09/2010 07:57:31 AM

## 2010-05-23 ENCOUNTER — Ambulatory Visit (INDEPENDENT_AMBULATORY_CARE_PROVIDER_SITE_OTHER): Payer: Medicare Other | Admitting: Vascular Surgery

## 2010-05-23 DIAGNOSIS — I70219 Atherosclerosis of native arteries of extremities with intermittent claudication, unspecified extremity: Secondary | ICD-10-CM

## 2010-05-24 NOTE — Assessment & Plan Note (Signed)
OFFICE VISIT  [name], Martin P DOB:  11/20/1930                                       05/23/2010 EAVWU#:[id number]  The patient presents for follow-up today.  We had originally planned to do an aortobifemoral bypass on him several weeks ago.  He is currently on Effient.  In an attempt to keep him anticoagulated, as his Effient was stopped, he was placed on to lower GI bleed.  This was thought to be secondary to the Integrilin.  He did have an upper and lower endoscopy which did not show any focal source of bleeding.  He subsequently developed bronchitis during his hospital stay, and it was decided during that hospital stay that we would send him home and bring him back after several weeks to reconsider his aortobifemoral bypass at a later date. He now returns for further follow-up.  He has occasional pain in his right foot but denies any appearance of ulcerations in the feet.  He does still have very short distance claudication.  He has had no further GI bleeding.  He continues to be on Effient.  He has had no chest pain or shortness of breath.  PHYSICAL EXAMINATION:  Today, blood pressure is 200/77 in the left arm, heart rate 79 and regular.  The right first toe is dusky in appearance, and there is duskiness around a callus on the right fifth toe.  The left foot is pink and reasonably well-perfused.  I believe the best option for the patient at this point would be to try to get him until May when he can come off his Effient and we can switch him to aspirin or a different medication.  Hopefully his foot will not develop any ulcerations prior to then and his ischemia will not be progressive.  If this does, we will have to consider readmitting him and placing him on a medication while his Effient is stopped.  He will follow up with me on Jul 11, 2010 for an additional evaluation to make sure that nothing has changed and then tentatively we will do  his aortobifemoral bypass on May 14th.    Janetta Hora. Fields, MD Electronically Signed  CEF/MEDQ  D:  05/23/2010  T:  05/24/2010  Job:  [id number]  cc:   Martin [name]. Sharyn Lull, M.D. Frazier Butt, D.O.

## 2010-05-28 LAB — COMPREHENSIVE METABOLIC PANEL
ALT: 16 U/L (ref 0–53)
AST: 33 U/L (ref 0–37)
Albumin: 3.5 g/dL (ref 3.5–5.2)
Alkaline Phosphatase: 72 U/L (ref 39–117)
Chloride: 100 mEq/L (ref 96–112)
GFR calc Af Amer: 51 mL/min — ABNORMAL LOW (ref >60)
Potassium: 5.2 mEq/L — ABNORMAL HIGH (ref 3.5–5.1)
Total Bilirubin: 0.7 mg/dL (ref 0.3–1.2)

## 2010-05-28 LAB — CBC
HCT: 39.5 % (ref 39.0–52.0)
HCT: 43 % (ref 39.0–52.0)
HCT: 46.4 % (ref 39.0–52.0)
Hemoglobin: 13.3 g/dL (ref 13.0–17.0)
Hemoglobin: 14.6 g/dL (ref 13.0–17.0)
Hemoglobin: 15.9 g/dL (ref 13.0–17.0)
Hemoglobin: 16.5 g/dL (ref 13.0–17.0)
MCHC: 33.9 g/dL (ref 30.0–36.0)
MCHC: 34.5 g/dL (ref 30.0–36.0)
MCHC: 34.5 g/dL (ref 30.0–36.0)
MCHC: 34.7 g/dL (ref 30.0–36.0)
Platelets: 139 10*3/uL — ABNORMAL LOW (ref 150–400)
Platelets: 143 10*3/uL — ABNORMAL LOW (ref 150–400)
Platelets: 143 10*3/uL — ABNORMAL LOW (ref 150–400)
Platelets: 152 10*3/uL (ref 150–400)
RBC: 4.06 MIL/uL — ABNORMAL LOW (ref 4.22–5.81)
RBC: 4.8 MIL/uL (ref 4.22–5.81)
RDW: 13.2 % (ref 11.5–15.5)
RDW: 13.4 % (ref 11.5–15.5)
RDW: 13.4 % (ref 11.5–15.5)
WBC: 15.4 10*3/uL — ABNORMAL HIGH (ref 4.0–10.5)
WBC: 20.9 10*3/uL — ABNORMAL HIGH (ref 4.0–10.5)
WBC: 8.9 10*3/uL (ref 4.0–10.5)

## 2010-05-28 LAB — POCT I-STAT 3, ART BLOOD GAS (G3+)
Acid-base deficit: 1 mmol/L (ref 0.0–2.0)
Patient temperature: 97.9
pH, Arterial: 7.413 (ref 7.350–7.450)
pO2, Arterial: 69 mmHg — ABNORMAL LOW (ref 80.0–100.0)

## 2010-05-28 LAB — GLUCOSE, CAPILLARY
Glucose-Capillary: 138 mg/dL — ABNORMAL HIGH (ref 70–99)
Glucose-Capillary: 152 mg/dL — ABNORMAL HIGH (ref 70–99)
Glucose-Capillary: 154 mg/dL — ABNORMAL HIGH (ref 70–99)
Glucose-Capillary: 172 mg/dL — ABNORMAL HIGH (ref 70–99)
Glucose-Capillary: 184 mg/dL — ABNORMAL HIGH (ref 70–99)
Glucose-Capillary: 240 mg/dL — ABNORMAL HIGH (ref 70–99)

## 2010-05-28 LAB — BASIC METABOLIC PANEL
BUN: 15 mg/dL (ref 6–23)
BUN: 33 mg/dL — ABNORMAL HIGH (ref 6–23)
BUN: 35 mg/dL — ABNORMAL HIGH (ref 6–23)
CO2: 24 mEq/L (ref 19–32)
CO2: 25 mEq/L (ref 19–32)
Calcium: 8.2 mg/dL — ABNORMAL LOW (ref 8.4–10.5)
Calcium: 8.5 mg/dL (ref 8.4–10.5)
Calcium: 8.7 mg/dL (ref 8.4–10.5)
Calcium: 8.9 mg/dL (ref 8.4–10.5)
Calcium: 9 mg/dL (ref 8.4–10.5)
Calcium: 9.3 mg/dL (ref 8.4–10.5)
Chloride: 107 mEq/L (ref 96–112)
Creatinine, Ser: 1.06 mg/dL (ref 0.4–1.5)
Creatinine, Ser: 1.23 mg/dL (ref 0.4–1.5)
GFR calc Af Amer: >60 mL/min (ref >60)
GFR calc Af Amer: >60 mL/min (ref >60)
GFR calc Af Amer: >60 mL/min (ref >60)
GFR calc non Af Amer: 42 mL/min — ABNORMAL LOW (ref >60)
GFR calc non Af Amer: 51 mL/min — ABNORMAL LOW (ref >60)
GFR calc non Af Amer: 54 mL/min — ABNORMAL LOW (ref >60)
GFR calc non Af Amer: >60 mL/min (ref >60)
Glucose, Bld: 150 mg/dL — ABNORMAL HIGH (ref 70–99)
Glucose, Bld: 160 mg/dL — ABNORMAL HIGH (ref 70–99)
Glucose, Bld: 161 mg/dL — ABNORMAL HIGH (ref 70–99)
Potassium: 4.2 mEq/L (ref 3.5–5.1)
Potassium: 4.4 mEq/L (ref 3.5–5.1)
Potassium: 4.8 mEq/L (ref 3.5–5.1)
Sodium: 136 mEq/L (ref 135–145)
Sodium: 137 mEq/L (ref 135–145)
Sodium: 137 mEq/L (ref 135–145)
Sodium: 141 mEq/L (ref 135–145)

## 2010-05-28 LAB — CARDIAC PANEL(CRET KIN+CKTOT+MB+TROPI)
CK, MB: 11.5 ng/mL (ref 0.3–4.0)
CK, MB: 8.2 ng/mL (ref 0.3–4.0)
CK, MB: 9.7 ng/mL (ref 0.3–4.0)
Relative Index: 2.1 (ref 0.0–2.5)
Relative Index: 2.1 (ref 0.0–2.5)
Relative Index: 2.3 (ref 0.0–2.5)
Relative Index: 2.4 (ref 0.0–2.5)
Relative Index: INVALID (ref 0.0–2.5)
Total CK: 124 U/L (ref 7–232)
Total CK: 233 U/L — ABNORMAL HIGH (ref 7–232)
Total CK: 98 U/L (ref 7–232)
Troponin I: 0.02 ng/mL (ref 0.00–0.06)
Troponin I: 0.08 ng/mL — ABNORMAL HIGH (ref 0.00–0.06)
Troponin I: 0.86 ng/mL (ref 0.00–0.06)
Troponin I: 1.09 ng/mL (ref 0.00–0.06)

## 2010-05-28 LAB — TROPONIN I: Troponin I: 1.22 ng/mL (ref 0.00–0.06)

## 2010-05-28 LAB — BLOOD GAS, ARTERIAL
Acid-base deficit: 4.3 mmol/L — ABNORMAL HIGH (ref 0.0–2.0)
TCO2: 21.1 mmol/L (ref 0–100)
pCO2 arterial: 35.3 mmHg (ref 35.0–45.0)
pH, Arterial: 7.372 (ref 7.350–7.450)
pO2, Arterial: 61.6 mmHg — ABNORMAL LOW (ref 80.0–100.0)

## 2010-05-28 LAB — PROTIME-INR
INR: 1.01 (ref 0.00–1.49)
Prothrombin Time: 13.2 seconds (ref 11.6–15.2)

## 2010-05-28 LAB — PLATELET INHIBITION P2Y12
Platelet Function  P2Y12: 246 [PRU] (ref 194–418)
Platelet Function Baseline: 192 [PRU] — ABNORMAL LOW (ref 194–418)

## 2010-05-28 LAB — CK TOTAL AND CKMB (NOT AT ARMC): Relative Index: 3 — ABNORMAL HIGH (ref 0.0–2.5)

## 2010-05-28 LAB — DIFFERENTIAL
Basophils Absolute: 0 10*3/uL (ref 0.0–0.1)
Basophils Relative: 0 % (ref 0–1)
Monocytes Absolute: 0.7 10*3/uL (ref 0.1–1.0)
Neutro Abs: 8.5 10*3/uL — ABNORMAL HIGH (ref 1.7–7.7)

## 2010-05-28 LAB — TSH: TSH: 1.38 u[IU]/mL (ref 0.350–4.500)

## 2010-05-28 LAB — LIPID PANEL: Triglycerides: 134 mg/dL (ref <150)

## 2010-07-11 ENCOUNTER — Encounter (INDEPENDENT_AMBULATORY_CARE_PROVIDER_SITE_OTHER): Payer: Medicare Other

## 2010-07-11 ENCOUNTER — Ambulatory Visit: Payer: Medicare Other | Admitting: Vascular Surgery

## 2010-07-11 ENCOUNTER — Ambulatory Visit (INDEPENDENT_AMBULATORY_CARE_PROVIDER_SITE_OTHER): Payer: Medicare Other | Admitting: Vascular Surgery

## 2010-07-11 DIAGNOSIS — I739 Peripheral vascular disease, unspecified: Secondary | ICD-10-CM

## 2010-07-11 DIAGNOSIS — I70219 Atherosclerosis of native arteries of extremities with intermittent claudication, unspecified extremity: Secondary | ICD-10-CM

## 2010-07-12 NOTE — Assessment & Plan Note (Signed)
OFFICE VISIT  [name], Martin P DOB:  [birth year]                                       07/11/2010 ZOXWR#:[id number]  The patient returns for followup today.  He was last seen on 05/23/2010. We had been seeing him in consideration for an aortobifemoral bypass. He previously had been admitted to the hospital and scheduled for an aortobifemoral bypass but had a GI bleed on Integrilin.  He was on the Integrilin in an attempt to try to stop his Effient.  He also developed bronchitis during that hospital stay and it was decided to cancel his operation and reschedule.  He was last seen in March of 2012.  He still has occasional early rest pain in his right foot, has very short distance claudication in both legs.  He is off Effient at this time. Dr. Sharyn Lull is considering restarting this after his aortobifemoral bypass.  CHRONIC MEDICAL PROBLEMS:  Remain hypertension, elevated cholesterol, coronary artery disease and moderate COPD.  These are all currently stable and followed by Dr. Margaretha Sheffield and Dr. Sharyn Lull.  SOCIAL HISTORY:  He is a former smoker.  He quit in May of 2011.  He currently lives alone and has requested a skilled nursing facility short- term placement postoperatively.  PAST SURGICAL HISTORY:  None.  FAMILY HISTORY:  No relatives with vascular disease at age less than 18 or other significant medical problems.  REVIEW OF SYSTEMS:  Full 12 point review of systems was performed with the patient today.  This is unchanged from 03/28/2010.  Of note, he has had no further GI bleeding.  PHYSICAL EXAM:  Vital signs:  Blood pressure 188/69 in the left arm, heart rate 72 and regular, oxygen saturation 97% on room air.  HEENT: Unremarkable.  Neck:  Has 2+ carotid pulses with a faint left carotid bruit.  He has a known 40%-60% left internal carotid artery stenosis from previous duplex.  Chest:  Clear to auscultation bilaterally. Cardiac:  Regular rate and rhythm  without murmur.  Abdomen:  Soft, nontender, nondistended.  No masses.  Extremities:  He has absent femoral pulses bilaterally.  Musculoskeletal:  Shows no major obvious joint deformities.  No significant edema.  Neurological:  Shows symmetric upper extremity and lower extremity motor strength which is 5/5.  Skin:  Has no open ulcers or rashes.  The right foot is pale in comparison with the left.  There is dependent rubor and elevation pallor.  He had bilateral ABIs performed today which I reviewed and interpreted which shows an ABI on the right side of 0.23, on the left of 0.48.  I reviewed his arteriogram from January of 2012.  Findings included an 80% stenosis of the left renal artery near the origin, a 50% stenosis of the right renal artery.  The left common iliac artery has a subtotal occlusion just after its origin.  There are multilevel high-grade stenosis in the left external iliac artery.  The left internal iliac artery is patent.  The left common femoral artery is patent.  There is subtotal occlusion of the right external iliac artery.  The right internal iliac artery is patent.  His runoff shows a right superficial femoral artery occlusion with reconstitution of the above knee popliteal artery with 3 vessel runoff to the right foot. The below knee popliteal artery was healthier in appearance than the above knee popliteal artery.  The runoff to the left leg showed a left superficial femoral artery occlusion with a patent below knee popliteal artery similar to the right and 3 vessel runoff to the left foot.  Risks, benefits, possible complications and procedure details were again reviewed with the patient today as far as aortobifemoral bypass grafting was concerned.  I discussed with him that he does have some disease below the level of the aortobifemoral bypass graft but we should be able with an aortobifemoral to take him out of the claudication range and significantly  improve his symptoms as well as preserve the right leg which has fairly depressed ABIs.  Previously I had considered a possible renal intervention at the same time.  However, due to the patient's age and overall comorbidities I believe this would be best reserved for a procedure at a later date with possible angioplasty and stenting if necessary.  He does not really have any significant symptoms related to renal artery stenosis as his blood pressure is reasonably controlled on 3 medications and he does not have any evidence currently of renal dysfunction.  Risks, benefits, possible complications and procedure details were explained to the patient and his daughter today including but not limited to myocardial events, bleeding, infection, limb loss. He understands and agrees to proceed.    Janetta Hora. Vail Basista, MD Electronically Signed  CEF/MEDQ  D:  07/11/2010  T:  07/12/2010  Job:  [id number]  cc:   Frazier Butt, D.O. Eduardo Osier. Sharyn Lull, M.D.

## 2010-07-16 ENCOUNTER — Other Ambulatory Visit: Payer: Self-pay | Admitting: Vascular Surgery

## 2010-07-16 ENCOUNTER — Ambulatory Visit (HOSPITAL_COMMUNITY)
Admission: RE | Admit: 2010-07-16 | Discharge: 2010-07-16 | Disposition: A | Discharge status: Home / Self Care | Payer: Medicare Other | Source: Ambulatory Visit | Attending: Vascular Surgery | Admitting: Vascular Surgery

## 2010-07-16 ENCOUNTER — Encounter (HOSPITAL_COMMUNITY)
Admission: RE | Admit: 2010-07-16 | Discharge: 2010-07-16 | Disposition: A | Discharge status: Home / Self Care | Payer: Medicare Other | Source: Ambulatory Visit | Attending: Vascular Surgery | Admitting: Vascular Surgery

## 2010-07-16 DIAGNOSIS — I10 Essential (primary) hypertension: Secondary | ICD-10-CM | POA: Insufficient documentation

## 2010-07-16 DIAGNOSIS — Z01818 Encounter for other preprocedural examination: Secondary | ICD-10-CM | POA: Insufficient documentation

## 2010-07-16 DIAGNOSIS — J4489 Other specified chronic obstructive pulmonary disease: Secondary | ICD-10-CM | POA: Insufficient documentation

## 2010-07-16 DIAGNOSIS — J449 Chronic obstructive pulmonary disease, unspecified: Secondary | ICD-10-CM | POA: Insufficient documentation

## 2010-07-16 DIAGNOSIS — I739 Peripheral vascular disease, unspecified: Secondary | ICD-10-CM

## 2010-07-16 LAB — COMPREHENSIVE METABOLIC PANEL
Alkaline Phosphatase: 97 U/L (ref 39–117)
BUN: 20 mg/dL (ref 6–23)
Calcium: 9.6 mg/dL (ref 8.4–10.5)
Creatinine, Ser: 1.29 mg/dL (ref 0.4–1.5)
Glucose, Bld: 117 mg/dL — ABNORMAL HIGH (ref 70–99)
Total Protein: 7.1 g/dL (ref 6.0–8.3)

## 2010-07-16 LAB — SURGICAL PCR SCREEN
MRSA, PCR: NEGATIVE
Staphylococcus aureus: POSITIVE — AB

## 2010-07-16 LAB — BLOOD GAS, ARTERIAL
Bicarbonate: 20.8 mEq/L (ref 20.0–24.0)
FIO2: 0.21 %
O2 Saturation: 97.5 %
TCO2: 21.7 mmol/L (ref 0–100)
pO2, Arterial: 125 mmHg — ABNORMAL HIGH (ref 80.0–100.0)

## 2010-07-16 LAB — URINALYSIS, ROUTINE W REFLEX MICROSCOPIC
Bilirubin Urine: NEGATIVE
Hgb urine dipstick: NEGATIVE
Ketones, ur: NEGATIVE mg/dL
Specific Gravity, Urine: 1.006 (ref 1.005–1.030)
Urobilinogen, UA: 0.2 mg/dL (ref 0.0–1.0)

## 2010-07-16 LAB — PROTIME-INR
INR: 0.89 (ref 0.00–1.49)
Prothrombin Time: 12.3 seconds (ref 11.6–15.2)

## 2010-07-16 LAB — CBC
HCT: 38.6 % — ABNORMAL LOW (ref 39.0–52.0)
Hemoglobin: 12.5 g/dL — ABNORMAL LOW (ref 13.0–17.0)
MCHC: 32.4 g/dL (ref 30.0–36.0)
MCV: 87.1 fL (ref 78.0–100.0)
WBC: 8 10*3/uL (ref 4.0–10.5)

## 2010-07-22 ENCOUNTER — Inpatient Hospital Stay (HOSPITAL_COMMUNITY): Payer: Medicare Other

## 2010-07-22 ENCOUNTER — Inpatient Hospital Stay (HOSPITAL_COMMUNITY)
Admission: RE | Admit: 2010-07-22 | Discharge: 2010-07-29 | DRG: 237 | Disposition: A | Discharge status: Skilled Nursing Facility | Payer: Medicare Other | Source: Ambulatory Visit | Attending: Vascular Surgery | Admitting: Vascular Surgery

## 2010-07-22 DIAGNOSIS — E785 Hyperlipidemia, unspecified: Secondary | ICD-10-CM | POA: Diagnosis present

## 2010-07-22 DIAGNOSIS — Z7982 Long term (current) use of aspirin: Secondary | ICD-10-CM

## 2010-07-22 DIAGNOSIS — I2510 Atherosclerotic heart disease of native coronary artery without angina pectoris: Secondary | ICD-10-CM | POA: Diagnosis present

## 2010-07-22 DIAGNOSIS — K219 Gastro-esophageal reflux disease without esophagitis: Secondary | ICD-10-CM | POA: Diagnosis present

## 2010-07-22 DIAGNOSIS — E119 Type 2 diabetes mellitus without complications: Secondary | ICD-10-CM | POA: Diagnosis present

## 2010-07-22 DIAGNOSIS — J9600 Acute respiratory failure, unspecified whether with hypoxia or hypercapnia: Secondary | ICD-10-CM | POA: Diagnosis not present

## 2010-07-22 DIAGNOSIS — Z9861 Coronary angioplasty status: Secondary | ICD-10-CM

## 2010-07-22 DIAGNOSIS — J449 Chronic obstructive pulmonary disease, unspecified: Secondary | ICD-10-CM | POA: Diagnosis present

## 2010-07-22 DIAGNOSIS — R791 Abnormal coagulation profile: Secondary | ICD-10-CM

## 2010-07-22 DIAGNOSIS — E876 Hypokalemia: Secondary | ICD-10-CM | POA: Diagnosis not present

## 2010-07-22 DIAGNOSIS — I10 Essential (primary) hypertension: Secondary | ICD-10-CM | POA: Diagnosis present

## 2010-07-22 DIAGNOSIS — J95821 Acute postprocedural respiratory failure: Secondary | ICD-10-CM

## 2010-07-22 DIAGNOSIS — F17200 Nicotine dependence, unspecified, uncomplicated: Secondary | ICD-10-CM | POA: Diagnosis present

## 2010-07-22 DIAGNOSIS — J438 Other emphysema: Secondary | ICD-10-CM

## 2010-07-22 DIAGNOSIS — Z79899 Other long term (current) drug therapy: Secondary | ICD-10-CM

## 2010-07-22 DIAGNOSIS — Z880 Allergy status to penicillin: Secondary | ICD-10-CM

## 2010-07-22 DIAGNOSIS — Z01812 Encounter for preprocedural laboratory examination: Secondary | ICD-10-CM

## 2010-07-22 DIAGNOSIS — I70219 Atherosclerosis of native arteries of extremities with intermittent claudication, unspecified extremity: Secondary | ICD-10-CM

## 2010-07-22 DIAGNOSIS — Z01818 Encounter for other preprocedural examination: Secondary | ICD-10-CM

## 2010-07-22 DIAGNOSIS — I70229 Atherosclerosis of native arteries of extremities with rest pain, unspecified extremity: Principal | ICD-10-CM | POA: Diagnosis present

## 2010-07-22 DIAGNOSIS — D62 Acute posthemorrhagic anemia: Secondary | ICD-10-CM | POA: Diagnosis not present

## 2010-07-22 DIAGNOSIS — I701 Atherosclerosis of renal artery: Secondary | ICD-10-CM | POA: Diagnosis present

## 2010-07-22 DIAGNOSIS — E874 Mixed disorder of acid-base balance: Secondary | ICD-10-CM | POA: Diagnosis not present

## 2010-07-22 DIAGNOSIS — I252 Old myocardial infarction: Secondary | ICD-10-CM

## 2010-07-22 DIAGNOSIS — Z01810 Encounter for preprocedural cardiovascular examination: Secondary | ICD-10-CM

## 2010-07-22 DIAGNOSIS — J4489 Other specified chronic obstructive pulmonary disease: Secondary | ICD-10-CM | POA: Diagnosis present

## 2010-07-22 HISTORY — PX: OTHER SURGICAL HISTORY: SHX169

## 2010-07-22 LAB — POCT I-STAT 3, ART BLOOD GAS (G3+)
Acid-base deficit: 6 mmol/L — ABNORMAL HIGH (ref 0.0–2.0)
Bicarbonate: 19.9 meq/L — ABNORMAL LOW (ref 20.0–24.0)
Bicarbonate: 21 mEq/L (ref 20.0–24.0)
O2 Saturation: 98 %
O2 Saturation: 99 %
Patient temperature: 35.7
TCO2: 21 mmol/L (ref 0–100)
TCO2: 22 mmol/L (ref 0–100)
pCO2 arterial: 38.8 mmHg (ref 35.0–45.0)
pCO2 arterial: 39.5 mmHg (ref 35.0–45.0)
pH, Arterial: 7.311 — ABNORMAL LOW (ref 7.350–7.450)
pH, Arterial: 7.331 — ABNORMAL LOW (ref 7.350–7.450)
pO2, Arterial: 108 mmHg — ABNORMAL HIGH (ref 80.0–100.0)
pO2, Arterial: 140 mmHg — ABNORMAL HIGH (ref 80.0–100.0)

## 2010-07-22 LAB — CBC
HCT: 25.2 % — ABNORMAL LOW (ref 39.0–52.0)
HCT: 26.7 % — ABNORMAL LOW (ref 39.0–52.0)
Hemoglobin: 8.6 g/dL — ABNORMAL LOW (ref 13.0–17.0)
MCH: 27.9 pg (ref 26.0–34.0)
MCH: 28.5 pg (ref 26.0–34.0)
MCHC: 32.2 g/dL (ref 30.0–36.0)
MCV: 89 fL (ref 78.0–100.0)
MCV: 88.4 fL (ref 78.0–100.0)
Platelets: 150 10*3/uL (ref 150–400)
RBC: 2.83 MIL/uL — ABNORMAL LOW (ref 4.22–5.81)
RBC: 3.02 MIL/uL — ABNORMAL LOW (ref 4.22–5.81)
WBC: 8.9 10*3/uL (ref 4.0–10.5)

## 2010-07-22 LAB — BLOOD GAS, ARTERIAL
Acid-base deficit: 4.3 mmol/L — ABNORMAL HIGH (ref 0.0–2.0)
Drawn by: 331761
FIO2: 0.5 %
O2 Saturation: 99.4 %
Patient temperature: 98.6
RATE: 12 resp/min
pO2, Arterial: 162 mmHg — ABNORMAL HIGH (ref 80.0–100.0)

## 2010-07-22 LAB — DIFFERENTIAL
Basophils Relative: 0 % (ref 0–1)
Lymphocytes Relative: 10 % — ABNORMAL LOW (ref 12–46)
Lymphs Abs: 0.9 10*3/uL (ref 0.7–4.0)
Monocytes Absolute: 0.4 10*3/uL (ref 0.1–1.0)
Monocytes Relative: 5 % (ref 3–12)
Neutro Abs: 6.9 10*3/uL (ref 1.7–7.7)
Neutrophils Relative %: 84 % — ABNORMAL HIGH (ref 43–77)

## 2010-07-22 LAB — BASIC METABOLIC PANEL
BUN: 22 mg/dL (ref 6–23)
CO2: 21 mEq/L (ref 19–32)
CO2: 21 mEq/L (ref 19–32)
Chloride: 107 mEq/L (ref 96–112)
Creatinine, Ser: 1.08 mg/dL (ref 0.4–1.5)
GFR calc Af Amer: >60 mL/min (ref >60)
Glucose, Bld: 191 mg/dL — ABNORMAL HIGH (ref 70–99)
Potassium: 4.3 mEq/L (ref 3.5–5.1)
Potassium: 4.3 mEq/L (ref 3.5–5.1)
Sodium: 135 mEq/L (ref 135–145)

## 2010-07-22 LAB — PROTIME-INR: Prothrombin Time: 13.2 seconds (ref 11.6–15.2)

## 2010-07-22 LAB — APTT: aPTT: >200 seconds (ref 24–37)

## 2010-07-22 LAB — LACTIC ACID, PLASMA: Lactic Acid, Venous: 3.4 mmol/L — ABNORMAL HIGH (ref 0.5–2.2)

## 2010-07-23 ENCOUNTER — Inpatient Hospital Stay (HOSPITAL_COMMUNITY): Payer: Medicare Other

## 2010-07-23 DIAGNOSIS — Z9911 Dependence on respirator [ventilator] status: Secondary | ICD-10-CM

## 2010-07-23 LAB — COMPREHENSIVE METABOLIC PANEL
AST: 18 U/L (ref 0–37)
Albumin: 2.3 g/dL — ABNORMAL LOW (ref 3.5–5.2)
Alkaline Phosphatase: 47 U/L (ref 39–117)
BUN: 22 mg/dL (ref 6–23)
CO2: 21 mEq/L (ref 19–32)
Chloride: 108 mEq/L (ref 96–112)
Creatinine, Ser: 1.18 mg/dL (ref 0.4–1.5)
GFR calc non Af Amer: 60 mL/min — ABNORMAL LOW (ref >60)
Potassium: 4.3 mEq/L (ref 3.5–5.1)
Total Bilirubin: 0.4 mg/dL (ref 0.3–1.2)

## 2010-07-23 LAB — CBC
HCT: 29.7 % — ABNORMAL LOW (ref 39.0–52.0)
MCH: 28.1 pg (ref 26.0–34.0)
MCHC: 32 g/dL (ref 30.0–36.0)
Platelets: 134 10*3/uL — ABNORMAL LOW (ref 150–400)
RBC: 3.06 MIL/uL — ABNORMAL LOW (ref 4.22–5.81)
RDW: 15.1 % (ref 11.5–15.5)
WBC: 6.6 10*3/uL (ref 4.0–10.5)
WBC: 9.4 10*3/uL (ref 4.0–10.5)

## 2010-07-23 LAB — POCT I-STAT 3, ART BLOOD GAS (G3+)
pCO2 arterial: 34.2 mmHg — ABNORMAL LOW (ref 35.0–45.0)
pH, Arterial: 7.382 (ref 7.350–7.450)

## 2010-07-23 LAB — BASIC METABOLIC PANEL
Calcium: 7.7 mg/dL — ABNORMAL LOW (ref 8.4–10.5)
GFR calc Af Amer: 59 mL/min — ABNORMAL LOW (ref >60)
GFR calc non Af Amer: 49 mL/min — ABNORMAL LOW (ref >60)
Potassium: 4.1 mEq/L (ref 3.5–5.1)
Sodium: 135 mEq/L (ref 135–145)

## 2010-07-23 LAB — GLUCOSE, CAPILLARY
Glucose-Capillary: 167 mg/dL — ABNORMAL HIGH (ref 70–99)
Glucose-Capillary: 169 mg/dL — ABNORMAL HIGH (ref 70–99)
Glucose-Capillary: 161 mg/dL — ABNORMAL HIGH (ref 70–99)
Glucose-Capillary: 142 mg/dL — ABNORMAL HIGH (ref 70–99)
Glucose-Capillary: 161 mg/dL — ABNORMAL HIGH (ref 70–99)
Glucose-Capillary: 159 mg/dL — ABNORMAL HIGH (ref 70–99)

## 2010-07-23 LAB — DIFFERENTIAL
Basophils Absolute: 0 10*3/uL (ref 0.0–0.1)
Basophils Relative: 0 % (ref 0–1)
Eosinophils Absolute: 0 10*3/uL (ref 0.0–0.7)
Neutrophils Relative %: 82 % — ABNORMAL HIGH (ref 43–77)

## 2010-07-23 LAB — PROTIME-INR: Prothrombin Time: 15.8 seconds — ABNORMAL HIGH (ref 11.6–15.2)

## 2010-07-23 LAB — CALCIUM, IONIZED: Calcium, Ion: 1.12 mmol/L (ref 1.12–1.32)

## 2010-07-23 LAB — POCT I-STAT 4, (NA,K, GLUC, HGB,HCT)
Glucose, Bld: 170 mg/dL — ABNORMAL HIGH (ref 70–99)
HCT: 45 % (ref 39.0–52.0)

## 2010-07-23 LAB — APTT: aPTT: 34 seconds (ref 24–37)

## 2010-07-23 LAB — PHOSPHORUS: Phosphorus: 3 mg/dL (ref 2.3–4.6)

## 2010-07-23 LAB — PRO B NATRIURETIC PEPTIDE: Pro B Natriuretic peptide (BNP): 611.4 pg/mL — ABNORMAL HIGH (ref 0–450)

## 2010-07-24 ENCOUNTER — Inpatient Hospital Stay (HOSPITAL_COMMUNITY): Payer: Medicare Other

## 2010-07-24 DIAGNOSIS — D649 Anemia, unspecified: Secondary | ICD-10-CM

## 2010-07-24 LAB — TYPE AND SCREEN
Antibody Screen: NEGATIVE
Unit division: 0

## 2010-07-24 LAB — CBC
HCT: 28.7 % — ABNORMAL LOW (ref 39.0–52.0)
Hemoglobin: 9.1 g/dL — ABNORMAL LOW (ref 13.0–17.0)
MCH: 28.2 pg (ref 26.0–34.0)
MCV: 88.9 fL (ref 78.0–100.0)
Platelets: 147 10*3/uL — ABNORMAL LOW (ref 150–400)
RBC: 3.23 MIL/uL — ABNORMAL LOW (ref 4.22–5.81)
WBC: 7.4 10*3/uL (ref 4.0–10.5)

## 2010-07-24 LAB — BASIC METABOLIC PANEL
BUN: 21 mg/dL (ref 6–23)
CO2: 23 mEq/L (ref 19–32)
Chloride: 106 mEq/L (ref 96–112)
Creatinine, Ser: 1.49 mg/dL (ref 0.4–1.5)
Glucose, Bld: 173 mg/dL — ABNORMAL HIGH (ref 70–99)
Potassium: 4.2 mEq/L (ref 3.5–5.1)

## 2010-07-24 LAB — GLUCOSE, CAPILLARY
Glucose-Capillary: 171 mg/dL — ABNORMAL HIGH (ref 70–99)
Glucose-Capillary: 150 mg/dL — ABNORMAL HIGH (ref 70–99)
Glucose-Capillary: 153 mg/dL — ABNORMAL HIGH (ref 70–99)

## 2010-07-24 LAB — MAGNESIUM: Magnesium: 1.8 mg/dL (ref 1.5–2.5)

## 2010-07-25 LAB — BASIC METABOLIC PANEL
Calcium: 7.6 mg/dL — ABNORMAL LOW (ref 8.4–10.5)
GFR calc Af Amer: >60 mL/min (ref >60)
GFR calc non Af Amer: 51 mL/min — ABNORMAL LOW (ref >60)
Potassium: 4.1 mEq/L (ref 3.5–5.1)
Sodium: 136 mEq/L (ref 135–145)

## 2010-07-25 LAB — CBC
HCT: 26.5 % — ABNORMAL LOW (ref 39.0–52.0)
Hemoglobin: 8.5 g/dL — ABNORMAL LOW (ref 13.0–17.0)
MCH: 28.2 pg (ref 26.0–34.0)
RBC: 3.01 MIL/uL — ABNORMAL LOW (ref 4.22–5.81)

## 2010-07-25 LAB — GLUCOSE, CAPILLARY
Glucose-Capillary: 154 mg/dL — ABNORMAL HIGH (ref 70–99)
Glucose-Capillary: 167 mg/dL — ABNORMAL HIGH (ref 70–99)
Glucose-Capillary: 164 mg/dL — ABNORMAL HIGH (ref 70–99)
Glucose-Capillary: 131 mg/dL — ABNORMAL HIGH (ref 70–99)

## 2010-07-26 LAB — IRON AND TIBC
Iron: 11 ug/dL — ABNORMAL LOW (ref 42–135)
Saturation Ratios: 7 % — ABNORMAL LOW (ref 20–55)

## 2010-07-26 LAB — CBC
Hemoglobin: 8.8 g/dL — ABNORMAL LOW (ref 13.0–17.0)
MCH: 29.1 pg (ref 26.0–34.0)
MCV: 87.4 fL (ref 78.0–100.0)
Platelets: 145 10*3/uL — ABNORMAL LOW (ref 150–400)
RBC: 3.02 MIL/uL — ABNORMAL LOW (ref 4.22–5.81)
WBC: 8.7 10*3/uL (ref 4.0–10.5)

## 2010-07-26 LAB — BASIC METABOLIC PANEL
Chloride: 106 mEq/L (ref 96–112)
GFR calc non Af Amer: 53 mL/min — ABNORMAL LOW (ref >60)
Glucose, Bld: 148 mg/dL — ABNORMAL HIGH (ref 70–99)
Potassium: 3.9 mEq/L (ref 3.5–5.1)
Sodium: 138 mEq/L (ref 135–145)

## 2010-07-26 LAB — FERRITIN: Ferritin: 286 ng/mL (ref 22–322)

## 2010-07-26 LAB — FOLATE: Folate: 6.4 ng/mL

## 2010-07-26 LAB — GLUCOSE, CAPILLARY
Glucose-Capillary: 156 mg/dL — ABNORMAL HIGH (ref 70–99)
Glucose-Capillary: 131 mg/dL — ABNORMAL HIGH (ref 70–99)

## 2010-07-27 LAB — CBC
MCH: 28.4 pg (ref 26.0–34.0)
MCV: 86.8 fL (ref 78.0–100.0)
Platelets: 166 10*3/uL (ref 150–400)
RDW: 15.3 % (ref 11.5–15.5)

## 2010-07-27 LAB — GLUCOSE, CAPILLARY
Glucose-Capillary: 118 mg/dL — ABNORMAL HIGH (ref 70–99)
Glucose-Capillary: 129 mg/dL — ABNORMAL HIGH (ref 70–99)
Glucose-Capillary: 135 mg/dL — ABNORMAL HIGH (ref 70–99)
Glucose-Capillary: 186 mg/dL — ABNORMAL HIGH (ref 70–99)

## 2010-07-27 LAB — BASIC METABOLIC PANEL
BUN: 25 mg/dL — ABNORMAL HIGH (ref 6–23)
Calcium: 8.2 mg/dL — ABNORMAL LOW (ref 8.4–10.5)
Creatinine, Ser: 1.59 mg/dL — ABNORMAL HIGH (ref 0.4–1.5)
GFR calc Af Amer: 51 mL/min — ABNORMAL LOW (ref >60)
GFR calc non Af Amer: 42 mL/min — ABNORMAL LOW (ref >60)

## 2010-07-28 LAB — GLUCOSE, CAPILLARY
Glucose-Capillary: 134 mg/dL — ABNORMAL HIGH (ref 70–99)
Glucose-Capillary: 164 mg/dL — ABNORMAL HIGH (ref 70–99)
Glucose-Capillary: 176 mg/dL — ABNORMAL HIGH (ref 70–99)

## 2010-07-28 LAB — BASIC METABOLIC PANEL
CO2: 27 mEq/L (ref 19–32)
Calcium: 8 mg/dL — ABNORMAL LOW (ref 8.4–10.5)
Chloride: 101 mEq/L (ref 96–112)
GFR calc Af Amer: 55 mL/min — ABNORMAL LOW (ref >60)
Potassium: 3.1 mEq/L — ABNORMAL LOW (ref 3.5–5.1)
Sodium: 137 mEq/L (ref 135–145)

## 2010-07-28 LAB — CBC
Hemoglobin: 8.3 g/dL — ABNORMAL LOW (ref 13.0–17.0)
MCHC: 32.9 g/dL (ref 30.0–36.0)
RBC: 2.92 MIL/uL — ABNORMAL LOW (ref 4.22–5.81)
WBC: 7.7 10*3/uL (ref 4.0–10.5)

## 2010-07-29 DIAGNOSIS — I70219 Atherosclerosis of native arteries of extremities with intermittent claudication, unspecified extremity: Secondary | ICD-10-CM

## 2010-07-29 LAB — GLUCOSE, CAPILLARY: Glucose-Capillary: 141 mg/dL — ABNORMAL HIGH (ref 70–99)

## 2010-07-29 LAB — BASIC METABOLIC PANEL
CO2: 24 mEq/L (ref 19–32)
Calcium: 8.4 mg/dL (ref 8.4–10.5)
Creatinine, Ser: 1.29 mg/dL (ref 0.4–1.5)
GFR calc Af Amer: >60 mL/min (ref >60)
Glucose, Bld: 129 mg/dL — ABNORMAL HIGH (ref 70–99)

## 2010-07-30 NOTE — Op Note (Signed)
NAME:  Martin [name], Martin [name]                ACCOUNT NO.:  [id number]  MEDICAL RECORD NO.:  [id number]           PATIENT TYPE:  I  LOCATION:  2306                         FACILITY:  [facility]  PHYSICIAN:  Janetta Hora. Fields, MD  DATE OF BIRTH:  1930/11/07  DATE OF PROCEDURE:  07/22/2010 DATE OF DISCHARGE:                              OPERATIVE REPORT   PROCEDURE: 1. Aortobifemoral bypass. 2. Right femoral to below-knee popliteal bypass using non reversed     right greater saphenous vein. 3. Intraoperative arteriogram x1.  PREOPERATIVE DIAGNOSIS:  Rest pain right foot with left foot claudication.  POSTOPERATIVE DIAGNOSIS:  Rest pain right foot with left foot claudication.  ANESTHESIA:  General.  ASSISTANTS:  Gretta Began, MD, Newton Pigg, PA-C  OPERATIVE FINDINGS: 1. 16 x 8-mm Dacron graft end-to-side proximal anastomosis. 2. A 2.5-3 mm right greater saphenous vein. 3. Three-vessel runoff to the right foot on completion arteriogram     with nonerosive runoff vessel being the posterior tibial.  OPERATIVE DETAILS:  After obtaining informed consent, the patient was taken to the operating room.  The patient was placed in supine position on operating room table.  After induction of general anesthesia and endotracheal intubation, a nasogastric tube and Foley catheter were placed.  Next, a longitudinal incision was made in the right groin after sterile prep and drape of the abdomen to the toes.  Incision was carried down through the subcutaneous tissues down to the level of right common femoral artery.  This was fairly soft on its anterior surface but thickened on palpation.  Circumflex iliac branches were dissected free circumferentially and vessel loops placed around these.  Vessel loop was also placed around the superficial femoral and profunda femoris arteries.  The tunnel was then created underneath the inguinal ligament up into the abdomen.  The crossing vein over the external  iliac artery was ligated and divided between silk ties.  Next, in a similar fashion, a longitudinal incision was made in left groin and carried down through subcutaneous tissues down the level of the left common femoral artery. This was of similar caliber and quality.  Profunda femoris and superficial femoral arteries were dissected free circumferentially and vessel loops placed around these.  The circumflex iliac branches were dissected free circumferentially and vessel loops placed around these. Again, a tunnel was sorted underneath the inguinal ligament with the crossing veins ligated and divided between silk ties.  Attention was then turned to the abdomen.  A midline laparotomy incision was made extending from the xiphoid down to the pubis.  Incision was carried down through the subcutaneous tissues down the level of fascia.  The fascia and peritoneum were incised with full lengthy incision.  There were a few adhesions down the right lower quadrant.  These were taken down with cautery dissection.  Next, the omentum and transverse colon were reflected superiorly and small bowel was reflected to the right.  Omni retractor was brought up in the operative field for assistance in retraction.  Retroperitoneal space was entered and the abdominal aorta dissected on its anterior surface.  Dissection was carried up to level of the  inferior mesenteric vein and this was ligated and divided between silk ties.  The dissection along the level of the aorta was carried all the way up to the level of left renal vein.  The inferior mesenteric artery was dissected free circumferentially and vessel loop placed around this.  Dissection was then carried down the aortic bifurcation and aorta was cleared on its anterior two third surface from the level of the left renal vein all the way to the aortic bifurcation.  The aorta was obviously atherosclerotic but was not calcified and had a good surface.  There was  some dilation of the abdominal aorta just below the inferior mesenteric artery while placing an aortic sizer across, this was only 22 mm in diameter.  I felt it was safe to leave this segment behind and not consider this to be aneurysmal.  At this point, the patient was given 7000 units of intravenous heparin.  It should be noted that the patient was given an additional 2 boluses of 5000 units of heparin during the course of the case.  After appropriate circulation time, the aorta was clamped just below the level of the renal arteries with an aortic DeBakey clamp.  The aorta was then clamped just above the iliac bifurcation and the inferior mesenteric artery was controlled with vessel loop.  A longitudinal opening was made in the aorta a few centimeters below the takeoff of the renal arteries.  It was decided that the anastomosis proximally would be constructed in an end-to-side configuration due to the fact that the patient had fairly severe external iliac artery disease and to preserve blood supply of the pelvis.  A 16 x 8 mm Dacron graft was then brought up in the operative field and beveled and sewn end-of-graft to side-of-aorta using a running 3-0 Prolene suture.  Just prior to completion anastomosis, this was fore bled, back bled and thoroughly flushed.  Anastomosis was secured.  It was tested.  There was some needle hole bleeding and this was controlled with FloSeal.  A portion of the proximal anastomosis was then packed with a Ray-Tec sponge and the remainder of the retroperitoneal tunnels were completed and the right and left limbs were brought retroureter retrocolic down to the left groin and retroureter down to the right groin.  The distal aortic clamp was removed.  There was good Doppler flow in the inferior mesenteric artery at this point.  Next, the profunda femoris and superficial femoral arteries and circumflex iliac branches were controlled with vessel loops in the right  groin.  A longitudinal opening was made in the right common femoral artery.  There was essentially no forward flow coming on the native external iliac artery system.  Graft was cut to length and beveled and sewn end-of- graft to side of the common femoral artery just above the femoral bifurcation using running 5-0 Prolene suture.  It should be noted that there was some backbleeding from the profunda femoris and superficial femoral arteries prior to revascularization and this was also present after backflushing with these arteries.  As the right common femoral artery and anastomosis was completed, everything was fore bled, bled and thoroughly flushed.  Anastomosis was secured, clamps released, there was a good pulsatile flow in the distal common femoral artery immediately. Doppler flow through the superficial femoral artery and profunda femoris arteries, however, was fairly monophasic.  Foot was inspected and there were found to be no Doppler signals in the right foot.  At this point, it was decided  to observe this for a few minutes while the left femoral anastomosis was completed.  Attention was then turned to left groin. The left common femoral artery was controlled proximally with a small Cooley clamp.  The profunda femoris and superficial femoral arteries were controlled with vessel loops.  Longitudinal opening was made in the common femoral artery just above the femoral bifurcation. Graft was then cut to length, beveled and sewn end of graft to side of common femoral artery using a running 5-0 Prolene suture.  Just prior to completion of anastomosis, this was fore bled, back bled and thoroughly flushed. Anastomosis was secured, clamps released, there was a pulsatile flow in the distal common femoral artery immediately.  There was brisk flow in the superficial femoral and profunda femoris arteries.  Doppler was used to examine the left foot and there was a good dorsalis pedis  and posterior tibial Doppler signal, although the posterior tibial Doppler signal dominated.  The foot has also become pinker at this point. Attention was then turned back to the patient's right foot.  It was still fairly mottled in appearance and it was decided at this point to do a right fem-pop bypass to increase outflow and also increase perfusion of the right foot.  At this point, the greater saphenous vein was identified in the medial portion of the right groin incision and this was harvested through several skip incisions down the leg.  Small side branches of the saphenous vein were ligated and divided between silk ties or clips.  Vein was harvested several centimeters below the knee.  On the medial aspect of the right leg below the knee, the incision was deepened down in the popliteal space and the below-knee popliteal artery was dissected free circumferentially.  This was soft in character.  The distal greater saphenous vein was ligated with a 2-0 silk tie and the vein transected.  The proximal greater saphenous vein was transected at the level of the saphenofemoral junction with 2-0 silk tie and then removed.  This was inspected and there were several small side branches that we repaired with 4-0 silk ties or 7-0 Prolene sutures.  The vein was gently distended with heparinized saline. Proximal aspect of the vein was of fairly good quality approximately 2.5 to even 3 mm in diameter.  However, the vein became smaller at the below- knee segment as low as approximately 2 or even slightly less than 2 mm in diameter at the distal most portion.  Vein was then placed in a nonreversed configuration.  The profunda femoris and superficial femoral arteries were controlled with vessel loops.  The right limb of the aortobifemoral bypass was controlled with a Cooley clamp.  A longitudinal opening was made in the hood of the right limb of the aortic bypass.  Vein was placed in a nonreversed  configuration, spatulated and sewn end-of-vein to side-of-graft using a running 5-0 Prolene suture.  Just prior to completion anastomosis, this was fore bled, back bled and thoroughly flushed.  Anastomosis was secured, clamps released, there was a pulsatile flow in the proximal portion of the vein graft immediately.  Next, a valvulotome was brought up in the operative field and all the valves were completely lysed throughout the bypass graft.  Graft was then marked for orientation and brought through a deep tunnel between the heads of gastrocnemius muscles and subsartorial. Attention was then turned to the below-knee popliteal artery.  The below- knee popliteal artery was controlled proximally with a Cooley clamp. This was  controlled distally with a fine bulldog clamp.  A longitudinal opening was made in the below-knee popliteal artery.  The vein graft was cut to length with the leg straightened and spatulated and sewn end-of- vein to side-of-artery using a running 6-0 Prolene suture.  Just prior to completion of anastomosis, this was fore bled, back bled , and thoroughly flushed.  Anastomosis was secured, clamps released, there was a pulsatile flow in the distal popliteal artery immediately.  Completion arteriogram was then obtained with inflow occlusion on the proximal vein graft.  A 21-gauge butterfly needle was introduced into the proximal vein graft and contrast injected showed a patent distal anastomosis with three-vessel runoff to the right foot with posterior tibial artery dominating.  The patient had a dorsalis pedis and posterior tibial Doppler signal in the right foot at this time.  Next, hemostasis was obtained in all incisions.  The saphenectomy incisions were closed in multiple layers with running 3-0 Vicryl suture and the staples in the skin.  The right groin incision was closed in multiple layers of running 2-0 and 3-0 Vicryl suture with a 4-0 Vicryl subcuticular stitch in  the skin.  The left groin incision was also closed in similar fashion with running layer of 2-0 Vicryl, 3-0 Vicryl, 4-0 Vicryl subcuticular stitch in the skin.  Dermabond was applied to both groin incisions.  The below- knee popliteal incision was closed with running 2-0 Vicryl suture followed by staples in the skin.  The Omni retractor was removed from the abdomen.  All sponge and needle counts were correct at this point. Initially, a laparotomy pad was missing and an x-ray was obtained in the abdomen and this shows a laparotomy pad in the right upper quadrant, this was then removed under direct vision and counts were correct at this point.  Viscera returned to their normal position after the retroperitoneum had been closed with running 3-0 Prolene suture.  The fascia was reapproximated using a running #1 PDS suture.  Skin was closed with staples.  The patient tolerated the procedure well and there were no complications.  Instrument, sponge and needle counts were correct at the end of the case.  The patient was taken to the intensive care unit intubated and in stable condition.     Janetta Hora. Fields, MD     CEF/MEDQ  D:  07/22/2010  T:  07/23/2010  Job:  [id number]  Electronically Signed by Fabienne Bruns MD on 07/30/2010 08:27:44 AM

## 2010-07-31 NOTE — Discharge Summary (Signed)
NAME:  Martin [name], Martin [name]                ACCOUNT NO.:  [id number]  MEDICAL RECORD NO.:  [id number]           PATIENT TYPE:  I  LOCATION:  2031                         FACILITY:  [facility]  PHYSICIAN:  Janetta Hora. Fields, MD  DATE OF BIRTH:  1930-06-06  DATE OF ADMISSION:  07/22/2010 DATE OF DISCHARGE:  07/29/2010                        DISCHARGE SUMMARY - REFERRING   ADMISSION DIAGNOSIS:  Rest pain right foot with left foot claudication.  HISTORY OF PRESENT ILLNESS:  This is a 77 year old white male who has been followed by Dr. Darrick Penna as an outpatient for aortoiliac occlusive disease that was found with extensive workup including arteriography.  He is also noted to have renal artery stenosis.  The patient has a drug eluding stent, has been on Effient per Dr. Sharyn Lull. The patient is in need of an aortobifemoral bypass graft and was on Integrilin in an attempt to try to stop his Effient.  He then had a GI bleed.  He also developed bronchitis during the hospital stay and it was decided to cancel his operation and reschedule.  He was last seen in March 2012.  He has occasional early rest pain in his right foot, has very-short-distance claudication of both legs.  HOSPITAL COURSE:  The patient was admitted to the hospital and taken to the operating room, where he underwent aortobifemoral bypass as well as right femoral to below-the-knee popliteal bypass using nonreversed right greater saphenous vein and an intraoperative arteriogram.  He tolerated the procedure well and was transferred to the recovery room in satisfactory condition.  On postoperative day #1, he remained on the ventilator.  He was on 30% FiO2.  On postoperative day #2, he was extubated.  His abdomen was slightly distended with faint bowel sounds. Therefore he was kept n.p.o. and continued his NG tube.  On postoperative day #3, he was transferred to step-down unit.  His NG tube was removed, and he underwent diuresis.  On  postoperative day #4, his diet was advanced, and he was started back on his p.o. medications.  He did have some postoperative hypokalemia that was supplemented and improved.  His postoperative ABIs include 0.69 on the right and 0.54 on the left.  Otherwise, his hospital course included increasing ambulation as well as increasing intake of solids without difficulty.  Of note, his preoperative ABIs included 0.23 on the right and 0.51 on the left.  DISCHARGE INSTRUCTIONS:  He is discharged to a skilled nursing facility with extensive instructions on wound care and progressive ambulation. He is also instructed not to perform any heavy lifting until returning to see Dr. Darrick Penna in his office.  DISCHARGE DIAGNOSES: 1. Aortoiliac disease.     a.     Status post aortobifemoral bypass graft on Jul 22, 2010.     b.     Right femoral to below-the-knee popliteal bypass on Jul 22, 2010. 2. Hypokalemia, improved. 3. History of coronary artery disease.     a.     Status post remote myocardial infarction and drug-eluting      stent placed in May 2011 by Dr. Sharyn Lull. 4. Chronic obstructive  pulmonary disease. 5. Type 2 diabetes. 6. Hyperlipidemia. 7. History of tobacco use. 8. History of GI bleed while on Integrilin.     a.     Status post EGD and colonoscopy. 9. History of bronchitis. 10.Renal artery stenosis.  DISCHARGE MEDICATIONS: 1. Vicodin 5/500 one p.o. q.4-6 h. p.r.n. pain, #30, no refills. 2. Alprazolam 0.5 mg one-half tablet p.o. at bedtime p.r.n. 3. Aspirin 81 mg p.o. daily. 4. Diltiazem CD/XT 120 mg p.o. daily. 5. Pepcid 20 mg p.o. daily. 6. Fish oil over the counter 1 capsule p.o. daily. 7. Garlic extract 1610 mg p.o. daily. 8. Losartan 50 mg p.o. daily. 9. Metoprolol XL 25 mg p.o. daily. 10.Multivitamin p.o. daily. 11.Symbicort 160/4.5 mcg 1 puff inhaled b.i.d. 12.Vitamin B12 250 mcg one p.o. daily.  At this time, the patient is to continue not taking his Effient.   At this time, we will probably restart this in 2-3 weeks when he returns to see Dr. Darrick Penna in his office.  FOLLOWUP:  The patient is to follow up with Dr. Darrick Penna in 2 weeks.    ______________________________ Newton Pigg, PA   ______________________________ Janetta Hora. Fields, MD    SE/MEDQ  D:  07/29/2010  T:  07/29/2010  Job:  [id number]  cc:   Eduardo Osier. Sharyn Lull, M.D.  Electronically Signed by Newton Pigg PA on 07/30/2010 12:58:01 PM Electronically Signed by Fabienne Bruns MD on 07/31/2010 02:52:57 PM

## 2010-08-02 ENCOUNTER — Ambulatory Visit (INDEPENDENT_AMBULATORY_CARE_PROVIDER_SITE_OTHER): Payer: Medicare Other

## 2010-08-02 DIAGNOSIS — L02419 Cutaneous abscess of limb, unspecified: Secondary | ICD-10-CM

## 2010-08-02 NOTE — Assessment & Plan Note (Signed)
OFFICE VISIT  Martin [name], Martin [name] DOB:  December 14, 1930                                       08/02/2010 [id number]#:[id number]  This is a patient of Dr. Lisbeth Renshaw.  Dr. Imogene Burn is the attending physician in the office today.  This is a postop visit for status post aortobifemoral right fem-pop bypass.  HISTORY OF PRESENT ILLNESS:  The patient is a 77 year old gentleman who had an aortobifemoral and right fem-pop bypass with saphenous vein on 07/22/2010.  He had been staying at [facility] and was sent in today because of redness around the area of his staples.  The patient has been doing well.  He has a fair appetite.  He has been ambulating without difficulty.  He denies any fever or chills.  White count on 05/24 was 12; it had been 7.7 on 07/28/2010.  He did have some areas of redness around the wounds in the areas of the staples.  He had been on doxycycline 100 mg twice daily for 4 days.  He has had no fever, no chills.  He has a small hematoma in the left groin.  PHYSICAL EXAM:  This is a well-developed, well-nourished gentleman in no acute distress.  His blood pressure was 168/64.  His heart rate was 70, sats were 100%, temperature was 98.1.  His abdomen was soft and nontender.  He had normoactive bowel sounds.  He had minimal areas of redness just at the sites of the insertion of the staples.  All the staples were removed from the abdomen and his right lower extremity.  He had good Doppler signal in both lower extremities.  He had palpable DP pulses.  His groin wounds, the right was slightly red but was not open. The area along all the staple lines were erythematous especially where the insertion of the staples were but he had no breakdown of the wound and the left groin had a moderate egg sized hematoma which was nontender, nonpulsatile and that wound was healing well.  ASSESSMENT: 1. A patient 2 weeks status post aortobifemoral bypass and right  femoral-popliteal bypass with vein with some areas of erythema     around the harvest site and along the staple line in the abdomen     and in the right groin without breakdown or drainage in this     afebrile patient. 2. Small hematoma, nonpulsatile in the left groin wound. 3. Good perfusion of bilateral lower extremities.  PLAN:  The staples were removed with some resolution of the erythema. All his wounds were painted with Betadine.  He will be leaving [employer] and have home health.  He will paint the wounds with Betadine twice a day and he was also placed on Cipro for one week and return to see Dr. Darrick Penna in a week to assess his wounds.  Della Goo, PA-C  Fransisco Hertz, MD Electronically Signed  RR/MEDQ  D:  08/02/2010  T:  08/02/2010  Job:  [id number]

## 2010-08-08 ENCOUNTER — Ambulatory Visit (INDEPENDENT_AMBULATORY_CARE_PROVIDER_SITE_OTHER): Payer: Medicare Other | Admitting: Vascular Surgery

## 2010-08-08 DIAGNOSIS — I7092 Chronic total occlusion of artery of the extremities: Secondary | ICD-10-CM

## 2010-08-09 NOTE — Assessment & Plan Note (Signed)
OFFICE VISIT  [name], Martin [name] DOB:  01-22-1931                                       08/08/2010 EAVWU#:[id number]  The patient returns for followup today.  He recently underwent aortobifemoral bypass with Dacron and a right femoral to below knee popliteal bypass using right greater saphenous vein.  He had an uneventful postoperative recovery, went to [facility] for several days and is now at home.  He has had some difficulty healing up his right groin incision and returns for further followup today.  He states that he has had some anorexia and some mild nausea with the Cipro he was started on because of some redness around the staples in his right groin.  The nausea however has been tolerable.  He has been able to get some food down.  His bowel function is slowly returning to normal.  He has not been able to really ambulate significantly enough to see if there is a difference in his claudication symptoms.  However, he states he does feel like his right foot is warmer.  PHYSICAL EXAM:  Blood pressure is 155/67 in the left arm, heart rate 80 and regular.  Temperature is 98.3.  Right groin incision the proximal 3 cm has some slight separation and there was some fibrinous exudate on this and this was debrided back to reasonably healthy tissue today. There is no graft exposed.  There is also a left groin lymphocele. There was also some superficial fibrinous exudate but this was tender and I did not debride this today.  His abdominal incision is intact with no evidence of hernia.  Both feet are pink, warm and well-perfused.  I believe the patient should continue on his Cipro until we get the groin wound healing better on the right side.  I reassured him that his appetite will continue to improve over time.  Hopefully he will be able to tolerate the Cipro for at least another week and his prescription for renewed for this today.  He will return for followup in  1 week's time.    Janetta Hora. Farhiya Rosten, MD Electronically Signed  CEF/MEDQ  D:  08/08/2010  T:  08/09/2010  Job:  [id number]  cc:   Frazier Butt, D.O.

## 2010-08-14 ENCOUNTER — Ambulatory Visit (INDEPENDENT_AMBULATORY_CARE_PROVIDER_SITE_OTHER): Payer: Medicare Other

## 2010-08-14 DIAGNOSIS — T8189XA Other complications of procedures, not elsewhere classified, initial encounter: Secondary | ICD-10-CM

## 2010-08-14 NOTE — Assessment & Plan Note (Signed)
OFFICE VISIT  [place], Chou P DOB:  04-14-1930                                       08/14/2010 OZHYQ#:[id number]  HISTORY OF PRESENT ILLNESS:  Patient is a 77 year old gentleman who underwent an aortobifemoral bypass with Dacron and a right femoral to below knee popliteal bypass using greater saphenous vein.  He was seen postop and had some open areas of his right groin wound, which was debrided by Dr. Darrick Penna last week.  He has been on Cipro, and he has been doing wet-to-dry dressings twice a day.  He is seen by home health nurse, who felt that there might have been a little deeper area in the right groin wound, and he came in today for follow-up.  PHYSICAL EXAMINATION:  This is a well-developed, well-nourished gentleman in no acute distress.  His heart rate was 70.  His sats are 100%.  His respiratory rate is 12.  His skin was warm and dry and normal temperature.  Right lower extremity:  All his wounds are healing very well.  The right groin wound is approximately 1 x 2 cm and approximately 0.5 cm deep.  There was one area at the base of the wound which was probed with a Q-tip, and this again did not connect with the deep portion of the wound.  It did not track down deep in the groin.  The wound is very clean, starting to granulate.  He also had an area on the proximal portion of the left groin wound that was slightly open and very superficial.  This was debrided.  A knot of suture was taken out. Otherwise the other wounds on his leg are healing well.  All of his wounds are soft.  The left groin is much softer than it had been a couple of weeks ago.  ASSESSMENT/PLAN:  Assessment is a healing right groin wound and small punctate opening in the left groin wound with all wounds improved in this patient who has been afebrile and denies any fever or chills.  He will continue on his Cipro and follow up with Dr. Darrick Penna next week.  I gave him a prescription for  hydrogel, as the right groin wound was fairly clean.  They will do dressing changes after showering once a day.  Della Goo, PA-C  Colma. Edilia Bo, M.D. Electronically Signed  RR/MEDQ  D:  08/14/2010  T:  08/14/2010  Job:  [id number]

## 2010-08-15 ENCOUNTER — Ambulatory Visit: Payer: Medicare Other | Admitting: Vascular Surgery

## 2010-08-29 ENCOUNTER — Ambulatory Visit (INDEPENDENT_AMBULATORY_CARE_PROVIDER_SITE_OTHER): Payer: Medicare Other | Admitting: Vascular Surgery

## 2010-08-29 ENCOUNTER — Ambulatory Visit: Payer: Medicare Other | Admitting: Vascular Surgery

## 2010-08-29 DIAGNOSIS — I70219 Atherosclerosis of native arteries of extremities with intermittent claudication, unspecified extremity: Secondary | ICD-10-CM

## 2010-08-30 NOTE — Assessment & Plan Note (Signed)
OFFICE VISIT  [name], Martin P DOB:  01-13-1931                                       08/29/2010 EAVWU#:[id number]  The patient returns for followup today.  He was last seen on 08/14/2010. He underwent an aortobifemoral bypass and a right femoral to below knee popliteal bypass on 07/22/2010.  He has had some difficulty healing up his right groin incision and also has a left groin seroma.  He was last seen on my partner, Dr. Edilia Bo, on June 6.  He denies any fever or chills.  He states that his appetite is starting to improve somewhat although he is still having to force some food down.  He states that he is walking much better than he remembers in several years.  He has had no significant drainage from the right groin.  PHYSICAL EXAM:  Blood pressure is 165/76 in the left arm, heart rate is 81 and regular.  Temperature is 98.  Right groin has a 3 x 3 cm open wound which is less than 3 mm in depth and has good granulation tissue. He also has a left groin seroma.  There is no obvious evidence of infection in either groin.  At this point the patient is doing well.  His groin incision on the right side is healing well and should be healed within the next few weeks.  We will schedule him for a followup visit in 3-4 weeks.  He will continue hydrogel dressings once a day with the assistance of home health.  He will need a vascular lab exam a few months down the road.    Janetta Hora. Dolores Ewing, MD Electronically Signed  CEF/MEDQ  D:  08/29/2010  T:  08/30/2010  Job:  [id number]

## 2010-09-09 ENCOUNTER — Encounter: Payer: Self-pay | Admitting: Vascular Surgery

## 2010-09-19 ENCOUNTER — Ambulatory Visit (INDEPENDENT_AMBULATORY_CARE_PROVIDER_SITE_OTHER): Payer: Medicare Other | Admitting: Vascular Surgery

## 2010-09-19 ENCOUNTER — Encounter: Payer: Self-pay | Admitting: Vascular Surgery

## 2010-09-19 VITALS — BP 169/71 | HR 80 | Resp 16

## 2010-09-19 DIAGNOSIS — I739 Peripheral vascular disease, unspecified: Secondary | ICD-10-CM

## 2010-09-19 NOTE — Progress Notes (Signed)
Patient returns for followup today. He previously underwent aortobifemoral bypass with right femoral to above-knee popliteal bypass with vein. He has had some difficulty healing of his right groin incision. He returns today for further followup. He denies any fever or chills. His appetite has not yet returned but he is able to hold food down. He is walking some.  Physical exam  Right groin has almost healed the wound is less than 1 mm in depth and 2-1/2 cm in diameter. He still has some persistent swelling in the right leg. The left groin incision is well healed he has a small seroma  Assessment: Healing right groin incision status post aortobifem with right femoropopliteal bypass  Plan: Return for followup in one month for ABIs graft duplex and further evaluation of his right groin

## 2010-09-19 NOTE — Progress Notes (Signed)
PVD F/U  S/P AORTOBIFEM BYPASS   RT FEMPOP BPG

## 2010-10-24 ENCOUNTER — Encounter (INDEPENDENT_AMBULATORY_CARE_PROVIDER_SITE_OTHER): Payer: Medicare Other

## 2010-10-24 ENCOUNTER — Encounter: Payer: Self-pay | Admitting: Vascular Surgery

## 2010-10-24 ENCOUNTER — Ambulatory Visit (INDEPENDENT_AMBULATORY_CARE_PROVIDER_SITE_OTHER): Payer: Medicare Other | Admitting: Vascular Surgery

## 2010-10-24 VITALS — BP 183/80 | HR 71 | Resp 18 | Ht 66.0 in | Wt 150.0 lb

## 2010-10-24 DIAGNOSIS — I739 Peripheral vascular disease, unspecified: Secondary | ICD-10-CM

## 2010-10-24 DIAGNOSIS — I70219 Atherosclerosis of native arteries of extremities with intermittent claudication, unspecified extremity: Secondary | ICD-10-CM

## 2010-10-24 DIAGNOSIS — Z48812 Encounter for surgical aftercare following surgery on the circulatory system: Secondary | ICD-10-CM

## 2010-10-24 NOTE — Progress Notes (Signed)
Subjective:     Patient ID: Martin [name], male   DOB: 12-28-1930, 77 y.o.   MRN: [id number]  HPI Patient underwent aortobifemoral bypass with right femoral to below-knee popliteal bypass with vein on 07/22/2010. He had some difficulty healing of his right groin incision. He returns for further followup today. He still complains of some claudication type symptoms in the right lower extremity. He states his left lower extremity is doing well. He has noticed no drainage from his right groin at this point. He is still doing local wound care to the right groin.  Chronic medical problems include hypertension, elevated cholesterol, coronary disease, moderate COPD. He is currently stable: Dr. Margaretha Sheffield Dr. her 63.    Review of Systems    pulmonary: Denies shortness of breath  Cardiac: Denies chest pain Objective:   Physical Exam Blood pressure 183/80, pulse 71, resp. rate 18, height 5\' 6"  (1.676 m), weight 150 lb (68.04 kg).  Lower extremities: Right groin has a wound 2 x 2 centimeters in diameter less than 1 mm in depth which is granulating, no palpable pedal pulses bilaterally  Abdomen: Soft nontender nondistended well-healed incision  Graft duplex: Velocities in the right femoropopliteal bypass R. decreased in the 40-60 centimeter per second range. ABI right 0.54, left 0.69    Assessment:     Right groin incision continuing to heal. Suggestion of vein graft stenosis or sluggish flow based on velocities.    Plan:     Since his right groin is not completely healed with the best option is continued conservative management and repeat duplex exam of his right bypass in 6 weeks. He will need to have complete healing of his right groin incision before any operative intervention is considered. If the graft flow still sluggish with my would consider arteriogram of the brachial approach.

## 2010-11-04 ENCOUNTER — Telehealth: Payer: Self-pay

## 2010-11-04 NOTE — Telephone Encounter (Signed)
Received PC from Luretha Rued.  Reports that pt. Is having increased discoloration of right great toe, and right ankle.  Describes right great toe as "deep red", and right ankle as having "dark areas".  Requesting to move f/u appt. sooner than 9/24.  Questioned if pt. having increased pain of right ft, and lower extremity.  Grandaughter replied that it really depends on the day...explained that if she doesn't do a lot of inquiring about  symptoms, then pt. doesn't offer complaints, and if she questions him about sx's, then he offers more complaints.  Advised grandaughter will request that pt's f/u be moved to an earlier date.  Agrees w/ plan.

## 2010-11-08 NOTE — Procedures (Unsigned)
BYPASS GRAFT EVALUATION  INDICATION:  Follow up bypass graft.  HISTORY: Diabetes:  No. Cardiac:  No. Hypertension:  Yes. Smoking:  Previous. Previous Surgery:  Right femoral to below-knee popliteal bypass graft on 07/22/2010.  SINGLE LEVEL ARTERIAL EXAM                              RIGHT              LEFT Brachial:                    185                183 Anterior tibial:             73                 115 Posterior tibial:            99                 127 Peroneal: Ankle/brachial index:        0.54               0.69  PREVIOUS ABI:  Date: 07/11/2010  RIGHT:  0.23  LEFT:  0.51  LOWER EXTREMITY BYPASS GRAFT DUPLEX EXAM:  DUPLEX:  Patent right femoral-to-popliteal bypass graft with no evidence of stenosis visualized.   IMPRESSION: 1. Patent right femoral to below-knee popliteal bypass graft with     velocity measurements shown on the following worksheet. 2. There is improvement bilaterally in ankle brachial indices in     comparison to the previous examination.        ___________________________________________ Janetta Hora Fields, MD  EM/MEDQ  D:  10/24/2010  T:  10/24/2010  Job:  [id number]

## 2010-11-12 ENCOUNTER — Ambulatory Visit (INDEPENDENT_AMBULATORY_CARE_PROVIDER_SITE_OTHER): Payer: Medicare Other

## 2010-11-12 DIAGNOSIS — Z48812 Encounter for surgical aftercare following surgery on the circulatory system: Secondary | ICD-10-CM

## 2010-11-12 DIAGNOSIS — I70229 Atherosclerosis of native arteries of extremities with rest pain, unspecified extremity: Secondary | ICD-10-CM

## 2010-11-19 ENCOUNTER — Encounter: Payer: Self-pay | Admitting: Physician Assistant

## 2010-11-20 ENCOUNTER — Ambulatory Visit (INDEPENDENT_AMBULATORY_CARE_PROVIDER_SITE_OTHER): Payer: Medicare Other | Admitting: Physician Assistant

## 2010-11-20 VITALS — BP 156/72 | HR 74 | Resp 18

## 2010-11-20 DIAGNOSIS — I739 Peripheral vascular disease, unspecified: Secondary | ICD-10-CM

## 2010-11-20 NOTE — Progress Notes (Signed)
HPI:  77 y/o WM who returns today for f/u of his right groin incision.  He states that he continues to have cramping in his right calf and has to rest at about 100 yards.  He states that he had ABIs last week that was .56 on the right, but I do not have a record of this.  Otherwise, he has no other complaints.  Filed Vitals:   11/20/10 1452  BP: 156/72  Pulse: 74  Resp: 18    Physical Exam:  Well healed incision on his abdomen, which is also soft and NT/ND +DP pulse on the right and + PT pulse on the left Both legs are warm. Right groin incision is completely healed with the exception of a pin point area and this is without drainage.  Assessment: 77 y/o male who is s/p aortobifemoral bypass grafting and right femoral to popliteal bypass on 07/22/10. His right groin is almost completely healed.  Plan:  After a discussion with he and his granddaughter, he would like to repeat the ABIs in 4 weeks and meet with Dr. Darrick Penna to decide upon if an arteriogram is the way he would like to proceed.  This will also give his groin a few more weeks to completely heal.   Attending is Dr. Edilia Bo

## 2010-11-29 ENCOUNTER — Encounter: Payer: Self-pay | Admitting: Vascular Surgery

## 2010-11-29 NOTE — Procedures (Unsigned)
BYPASS GRAFT EVALUATION  INDICATION:  Left lower extremity bypass graft.  HISTORY: Diabetes:  No Cardiac:  No Hypertension:  Yes Smoking:  Previous Previous Surgery:  Aortobifemoral and right femoral-popliteal bypass grafts on 07/22/2010.  SINGLE LEVEL ARTERIAL EXAM                              RIGHT              LEFT Brachial: Anterior tibial: Posterior tibial: Peroneal: Ankle/brachial index:  PREVIOUS ABI:  Date:  RIGHT:  LEFT:  LOWER EXTREMITY BYPASS GRAFT DUPLEX EXAM:  DUPLEX:  Dampened monophasic Doppler waveforms noted throughout the right lower extremity bypass graft with no increase in velocities. Limited visualization of the right distal anastomosis region based on vessel depth.  Collateral circulation is noted in the right distal thigh and knee region.  IMPRESSION: 1. Patent right lower extremity bypass graft with dampened velocities     noted. 2. No significant change noted when compared to the previous exam on     10/24/2010. 3. Ankle-brachial index is noted on separate report.  ___________________________________________ Janetta Hora. Fields, MD  CH/MEDQ  D:  11/15/2010  T:  11/15/2010  Job:  [id number]

## 2010-12-05 ENCOUNTER — Ambulatory Visit: Payer: Medicare Other | Admitting: Vascular Surgery

## 2010-12-16 ENCOUNTER — Other Ambulatory Visit: Payer: Self-pay | Admitting: Lab

## 2010-12-16 DIAGNOSIS — I70219 Atherosclerosis of native arteries of extremities with intermittent claudication, unspecified extremity: Secondary | ICD-10-CM

## 2010-12-16 DIAGNOSIS — Z48812 Encounter for surgical aftercare following surgery on the circulatory system: Secondary | ICD-10-CM

## 2010-12-18 ENCOUNTER — Encounter: Payer: Self-pay | Admitting: Vascular Surgery

## 2010-12-19 ENCOUNTER — Other Ambulatory Visit (INDEPENDENT_AMBULATORY_CARE_PROVIDER_SITE_OTHER): Payer: Medicare Other | Admitting: *Deleted

## 2010-12-19 ENCOUNTER — Encounter: Payer: Self-pay | Admitting: Vascular Surgery

## 2010-12-19 ENCOUNTER — Ambulatory Visit (INDEPENDENT_AMBULATORY_CARE_PROVIDER_SITE_OTHER): Payer: Medicare Other | Admitting: Vascular Surgery

## 2010-12-19 VITALS — BP 171/73 | HR 71 | Resp 20 | Ht 66.0 in | Wt 155.0 lb

## 2010-12-19 DIAGNOSIS — Z48812 Encounter for surgical aftercare following surgery on the circulatory system: Secondary | ICD-10-CM

## 2010-12-19 DIAGNOSIS — I70219 Atherosclerosis of native arteries of extremities with intermittent claudication, unspecified extremity: Secondary | ICD-10-CM

## 2010-12-19 NOTE — Progress Notes (Signed)
[facility] [place] HISTORY AND PHYSICAL   History of Present Illness:  Patient is a 77 y.o. year old male who presents for follow-up after aortobifem and right fem pop bypass in May 2012.  He continues to have claudication symptoms in his right leg after walking 100yds. He denies rest pain.  He has no wounds on the foot.  His atherosclerotic risk factors remain diabetes, elevated cholesterol, hypertension, and coronary artery disease.  These are all currently stabled and followed by his primary care physician.  His last duplex showed slow flow in the bypass but he wished to defer further intervention at that point.  Past Medical History  Diagnosis Date  . Hypertension   . Hyperlipidemia   . Myocardial infarction     1978 & 2011  . COPD (chronic obstructive pulmonary disease)   . Peripheral vascular disease, unspecified   . GERD (gastroesophageal reflux disease)   . Arthritis   . H/O: GI bleed   . Hiatal hernia   . Diabetes mellitus   . Bronchitis   . Renal artery stenosis     Past Surgical History  Procedure Date  . Aortobifemoral bpg 07/22/10    and Right femoral-popliteal BPG    Review of Systems:  Neurologic: no symptoms of TIA or stroke Cardiac:denies shortness of breath or chest pain Pulmonary: denies cough or wheeze  Social History History  Substance Use Topics  . Smoking status: Former Smoker -- 65 years    Types: Cigarettes    Quit date: 07/08/2009  . Smokeless tobacco: Never Used  . Alcohol Use: No    Allergies  Allergies  Allergen Reactions  . Penicillins      Current Outpatient Prescriptions  Medication Sig Dispense Refill  . ALPRAZolam (XANAX) 0.5 MG tablet Take 0.5 mg by mouth 2 (two) times daily as needed.        Marland Kitchen aspirin 81 MG tablet Take 81 mg by mouth daily.        . budesonide-formoterol (SYMBICORT) 160-4.5 MCG/ACT inhaler Inhale 2 puffs into the lungs 2 (two) times daily.        Marland Kitchen diltiazem (CARDIZEM CD) 120 MG 24 hr  capsule       . diltiazem (CARDIZEM) 120 MG tablet Take 120 mg by mouth daily.        . famotidine (PEPCID) 20 MG tablet Take 20 mg by mouth 2 (two) times daily.        Marland Kitchen losartan (COZAAR) 50 MG tablet Take 50 mg by mouth daily.        . metoprolol succinate (TOPROL-XL) 25 MG 24 hr tablet Take 25 mg by mouth daily.        . prasugrel (EFFIENT) 10 MG TABS Take 10 mg by mouth daily.        Marland Kitchen PRAVASTATIN SODIUM PO Take by mouth daily.          Physical Examination  Filed Vitals:   12/19/10 1145  BP: 171/73  Pulse: 71  Resp: 20  Height: 5\' 6"  (1.676 m)  Weight: 155 lb (70.308 kg)    Body mass index is 25.02 kg/(m^2).  General:  Alert and oriented, no acute distress Abdomen:  Well-healed midline laparotomy scar without evidence of hernia, no mass, nontender Skin: No ulcer or rash Extremities: The right calf is tight and more swollen than the left. The right foot is more dusky compared to the left. He has nonpalpable popliteal and pedal pulses bilaterally. He has 2+ femoral  pulses bilaterally with well-healed groin incisions.  DATA: He had bilateral ABIs performed today which were 0.4 on the right and 0.68 on the left. I reviewed and interpreted these studies. Both of these represent a slight decrease from his previous ABIs. Again noted were decreased velocities throughout the entire right lower extremity bypass graft   ASSESSMENT: Status post aortobifemoral bypass with right femoropopliteal bypass graft. I suspect he has probably had some degeneration or intimal hyperplasia throughout the entire bypass in the right leg. His ABI is currently in the range of threatened tissue loss. However, he currently only has claudication symptoms. I discussed with the patient possibility of a percutaneous intervention. However with his aortobifemoral bypass grafting this would be difficult. I did also discuss with him redoing the right femoropopliteal bypass but he is currently not interested in that. He  will continue to currently closely monitor his right foot and if he develops rest pain or nonhealing ulcer he'll return for followup sooner. Otherwise he'll return for a repeat vascular lab visit with ABIs in the duplex in 3 months time. He will continue to try walking 30 minutes daily. He will continue to refrain from smoking.   PLAN:  Fabienne Bruns, MD [facility] [place] Office: [phone] Pager: [phone]

## 2011-02-10 ENCOUNTER — Ambulatory Visit (INDEPENDENT_AMBULATORY_CARE_PROVIDER_SITE_OTHER): Payer: Medicare Other | Admitting: Vascular Surgery

## 2011-02-10 DIAGNOSIS — I739 Peripheral vascular disease, unspecified: Secondary | ICD-10-CM

## 2011-02-10 DIAGNOSIS — Z48812 Encounter for surgical aftercare following surgery on the circulatory system: Secondary | ICD-10-CM

## 2011-02-10 NOTE — Progress Notes (Signed)
Due to occluded graft and ischemic rt toe findings, after asking Dr. Myra Gianotti ,patient needs scheduled to see Dr. Darrick Penna ASAP to follow up.

## 2011-02-10 NOTE — Progress Notes (Signed)
abi and rle arterial duplex performed with positive findings and follow up appt scheduled with [facility] on 02/13/2011 4pm-sah

## 2011-02-12 ENCOUNTER — Encounter: Payer: Self-pay | Admitting: Vascular Surgery

## 2011-02-13 ENCOUNTER — Ambulatory Visit (INDEPENDENT_AMBULATORY_CARE_PROVIDER_SITE_OTHER): Payer: Medicare Other | Admitting: Vascular Surgery

## 2011-02-13 ENCOUNTER — Encounter: Payer: Self-pay | Admitting: Vascular Surgery

## 2011-02-13 VITALS — BP 200/73 | HR 70 | Resp 16 | Ht 66.0 in | Wt 160.0 lb

## 2011-02-13 DIAGNOSIS — T82898A Other specified complication of vascular prosthetic devices, implants and grafts, initial encounter: Secondary | ICD-10-CM

## 2011-02-13 DIAGNOSIS — I70219 Atherosclerosis of native arteries of extremities with intermittent claudication, unspecified extremity: Secondary | ICD-10-CM

## 2011-02-13 NOTE — Progress Notes (Signed)
[facility] [place] HISTORY AND PHYSICAL   History of Present Illness:  Patient is a 77 y.o. year old male who presents for follow-up evaluation for PAD.  He previously underwent an aortobifemoral bypass with a right femoropopliteal bypass below the knee on 07/22/2010. He was noted on recent scanning duplex that the right femoropopliteal bypass had occluded. He has noticed no changes in his walking distance. He has some mild claudication symptoms but is able to play 18 holes of golf easily. He has some occasional pain in his right first toe but does not really have rest pain. He is on Plavix/Aspirin for antiplatelet therapy. His atherosclerotic risk factors remain diabetes, elevated cholesterol, coronary artery disease, diabetes.   These are all currently stable.    Past Medical History  Diagnosis Date  . Hypertension   . Hyperlipidemia   . Myocardial infarction     1978 & 2011  . COPD (chronic obstructive pulmonary disease)   . Peripheral vascular disease, unspecified   . GERD (gastroesophageal reflux disease)   . Arthritis   . H/O: GI bleed   . Hiatal hernia   . Diabetes mellitus   . Bronchitis   . Renal artery stenosis     Past Surgical History  Procedure Date  . Aortobifemoral bpg 07/22/10    and Right femoral-popliteal BPG    Review of Systems:  Neurologic: sensation in the feet is intact Cardiac:denies shortness of breath or chest pain Pulmonary: denies cough or wheeze  Social History History  Substance Use Topics  . Smoking status: Former Smoker -- 65 years    Types: Cigarettes    Quit date: 07/08/2009  . Smokeless tobacco: Never Used  . Alcohol Use: No    Allergies  Allergies  Allergen Reactions  . Penicillins      Current Outpatient Prescriptions  Medication Sig Dispense Refill  . ALPRAZolam (XANAX) 0.5 MG tablet Take 0.5 mg by mouth 2 (two) times daily as needed.        Marland Kitchen aspirin 81 MG tablet Take 81 mg by mouth daily.        .  budesonide-formoterol (SYMBICORT) 160-4.5 MCG/ACT inhaler Inhale 2 puffs into the lungs 2 (two) times daily.        Marland Kitchen diltiazem (CARDIZEM CD) 120 MG 24 hr capsule       . diltiazem (CARDIZEM) 120 MG tablet Take 120 mg by mouth daily.        . famotidine (PEPCID) 20 MG tablet Take 20 mg by mouth 2 (two) times daily.        Marland Kitchen losartan (COZAAR) 50 MG tablet Take 50 mg by mouth daily.        . metoprolol succinate (TOPROL-XL) 25 MG 24 hr tablet Take 25 mg by mouth daily.        Marland Kitchen PRAVASTATIN SODIUM PO Take by mouth daily.        . prasugrel (EFFIENT) 10 MG TABS Take 10 mg by mouth daily.          Physical Examination  Filed Vitals:   02/13/11 1151  BP: 200/73  Pulse: 70  Resp: 16  Height: 5\' 6"  (1.676 m)  Weight: 160 lb (72.576 kg)  SpO2: 98%    Body mass index is 25.82 kg/(m^2).  General:  Alert and oriented, no acute distress HEENT: Normal Neck: No bruit or JVD Pulmonary: Clear to auscultation bilaterally Cardiac: Regular Rate and Rhythm without murmur Abdomen: Soft nontender nondistended no evidence of hernia Neurologic:  Upper and lower extremity motor 5/5 and symmetric Extremities:  2+ femoral with absent popliteal and pedal pulses bilaterally Skin: no ulcer or rash right first toe slightly dusky  DATA:  I reviewed and interpreted his ABIs and graft duplex from 02/10/2011. ABI on the right side is 0.52. Left is 0.68 the right femoropopliteal bypass is occluded. There is slightly decreased flow in the right limb of aortobifemoral bypass.  ASSESSMENT: Occlusion of right femoropopliteal bypass but does not have critical ischemia at this point. The graft was functioning marginally at his last office visit some not totally surprised by this. However we need to make sure that the slow flow through the limb on the right side is not compromised by poor outflow.   PLAN: CT angiogram of the abdomen and pelvis with lower extremity runoff if there is no significant narrowing of his  aortobifemoral bypass graft in the profunda femoris reasonable outflow we will continue conservative management for now. If the profunda has very poor outflow we would consider revision with a right femoropopliteal bypass. I will call the patient with the findings of the CT Angio. We will determine followup based on that.   Fabienne Bruns, MD [facility] [place] Office: [phone] Pager: [phone]

## 2011-02-13 NOTE — Progress Notes (Signed)
Addended by: Sharee Pimple on: 02/13/2011 01:05 PM   Modules accepted: Orders

## 2011-02-13 NOTE — Procedures (Unsigned)
BYPASS GRAFT EVALUATION  INDICATION:  Follow up peripheral vascular disease.  HISTORY: Diabetes:  No (patient not taking medicine but PCP says borderline). Cardiac:  No. Hypertension:  Yes. Smoking:  Previous. Previous Surgery:  Aortobifemoral bypass and right femoral-to-popliteal artery bypass graft on 07/22/2010.  SINGLE LEVEL ARTERIAL EXAM                              RIGHT              LEFT Brachial: Anterior tibial: Posterior tibial: Peroneal: Ankle/brachial index:        0.52               0.68  TOE BRACHIAL INDEX:  Right:0.28  Left: 0.47  PREVIOUS ABI:  Date: 10/24/2010  RIGHT:  0.54  LEFT:  0.69  LOWER EXTREMITY BYPASS GRAFT DUPLEX EXAM:  DUPLEX: 1. Normal dampened waveforms with low velocity flow noted at the right     distal limb of the aortobifemoral bypass graft, suggestive of a     more proximal stenosis versus occlusion. 2. Soft thrombus present involving the right femoral-to-popliteal     artery bypass graft, occluding vessel.   IMPRESSION: 1. Right femoral-to-popliteal artery bypass graft, occluded, as noted     above. 2. Abnormal waveform with low velocity flow present involving the     right limb of the aortobifemoral bypass graft, suggestive of more     proximal stenosis. 3. Right ankle brachial index stable and 0.52 since study on     10/24/2010. 4. Left ankle brachial index is stable and 0.68 since study on     10/24/2010. 5. Bilateral great toe  PPG waveforms are dampened and considered     abnormal. 6. Right toe brachial index is 0.28 and considered in the critical     ischemia range. 7. Left toe brachial index is 0.47 and is in the claudicant range.  ___________________________________________ Janetta Hora. Fields, MD  SH/MEDQ  D:  02/10/2011  T:  02/10/2011  Job:  [id number]

## 2011-02-19 ENCOUNTER — Ambulatory Visit
Admission: RE | Admit: 2011-02-19 | Discharge: 2011-02-19 | Disposition: A | Discharge status: Home / Self Care | Payer: Medicare Other | Source: Ambulatory Visit | Attending: Vascular Surgery | Admitting: Vascular Surgery

## 2011-02-19 DIAGNOSIS — T82898A Other specified complication of vascular prosthetic devices, implants and grafts, initial encounter: Secondary | ICD-10-CM

## 2011-02-19 DIAGNOSIS — I70219 Atherosclerosis of native arteries of extremities with intermittent claudication, unspecified extremity: Secondary | ICD-10-CM

## 2011-02-19 MED ORDER — IOHEXOL 350 MG/ML SOLN
165.0000 mL | Freq: Once | INTRAVENOUS | Status: AC | PRN
  Administered 2011-02-19: 165 mL via INTRAVENOUS

## 2011-03-17 ENCOUNTER — Telehealth: Payer: Self-pay | Admitting: Vascular Surgery

## 2011-03-17 NOTE — Telephone Encounter (Signed)
CTA results called to pt.  He has reasonable profunda runoff bilat. With really no symptoms.  Will schedule follow up in March 2013.  Fabienne Bruns, MD [facility] [place] Office: [phone] Pager: [phone]

## 2011-04-10 ENCOUNTER — Other Ambulatory Visit: Payer: Medicare Other

## 2011-04-14 ENCOUNTER — Other Ambulatory Visit: Payer: Self-pay | Admitting: Cardiology

## 2011-05-22 ENCOUNTER — Other Ambulatory Visit: Payer: Self-pay | Admitting: *Deleted

## 2011-05-22 DIAGNOSIS — I70219 Atherosclerosis of native arteries of extremities with intermittent claudication, unspecified extremity: Secondary | ICD-10-CM

## 2011-05-28 ENCOUNTER — Encounter: Payer: Self-pay | Admitting: Vascular Surgery

## 2011-05-29 ENCOUNTER — Encounter: Payer: Self-pay | Admitting: Vascular Surgery

## 2011-05-29 ENCOUNTER — Ambulatory Visit (INDEPENDENT_AMBULATORY_CARE_PROVIDER_SITE_OTHER): Payer: Medicare Other | Admitting: Vascular Surgery

## 2011-05-29 VITALS — BP 177/75 | HR 67 | Temp 97.4°F | Resp 16 | Ht 66.0 in | Wt 164.0 lb

## 2011-05-29 DIAGNOSIS — Z48812 Encounter for surgical aftercare following surgery on the circulatory system: Secondary | ICD-10-CM

## 2011-05-29 DIAGNOSIS — I739 Peripheral vascular disease, unspecified: Secondary | ICD-10-CM | POA: Diagnosis not present

## 2011-05-29 DIAGNOSIS — I70219 Atherosclerosis of native arteries of extremities with intermittent claudication, unspecified extremity: Secondary | ICD-10-CM

## 2011-05-29 NOTE — Progress Notes (Signed)
Addended by: Sharee Pimple on: 05/29/2011 02:02 PM   Modules accepted: Orders

## 2011-05-29 NOTE — Progress Notes (Signed)
History of Present Illness: Patient is a 78 y.o. year old male who presents for follow-up evaluation for PAD. He previously underwent an aortobifemoral bypass with a right femoropopliteal bypass below the knee on 07/22/2010. He was noted on recent scanning duplex that the right femoropopliteal bypass had occluded. He has noticed no changes in his walking distance. He has some mild claudication symptoms but is able to play 18 holes of golf easily. He has some occasional pain in his right first toe but does not really have rest pain. He is on Plavix/Aspirin for antiplatelet therapy. His atherosclerotic risk factors remain diabetes, elevated cholesterol, coronary artery disease, diabetes. These are all currently stable. He is walking approximately 100 yards before experiencing right leg claudication symptoms. He is able to walk a mile. He is currently satisfied with his walking distance. Past Medical History   Diagnosis  Date   .  Hypertension    .  Hyperlipidemia    .  Myocardial infarction      1978 & 2011   .  COPD (chronic obstructive pulmonary disease)    .  Peripheral vascular disease, unspecified    .  GERD (gastroesophageal reflux disease)    .  Arthritis    .  H/O: GI bleed    .  Hiatal hernia    .  Diabetes mellitus    .  Bronchitis    .  Renal artery stenosis     Past Surgical History   Procedure  Date   .  Aortobifemoral bpg  07/22/10     and Right femoral-popliteal BPG   Review of Systems:  Neurologic: sensation in the feet is intact  Cardiac:denies shortness of breath or chest pain  Pulmonary: denies cough or wheeze  Social History  History   Substance Use Topics   .  Smoking status:  Former Smoker -- 65 years     Types:  Cigarettes     Quit date:  07/08/2009   .  Smokeless tobacco:  Never Used   .  Alcohol Use:  No   Allergies  Allergies   Allergen  Reactions   .  Penicillins     Current Outpatient Prescriptions   Medication  Sig  Dispense  Refill   .  ALPRAZolam  (XANAX) 0.5 MG tablet  Take 0.5 mg by mouth 2 (two) times daily as needed.     Marland Kitchen  aspirin 81 MG tablet  Take 81 mg by mouth daily.     .  budesonide-formoterol (SYMBICORT) 160-4.5 MCG/ACT inhaler  Inhale 2 puffs into the lungs 2 (two) times daily.     Marland Kitchen  diltiazem (CARDIZEM CD) 120 MG 24 hr capsule      .  diltiazem (CARDIZEM) 120 MG tablet  Take 120 mg by mouth daily.     .  famotidine (PEPCID) 20 MG tablet  Take 20 mg by mouth 2 (two) times daily.     Marland Kitchen  losartan (COZAAR) 50 MG tablet  Take 50 mg by mouth daily.     .  metoprolol succinate (TOPROL-XL) 25 MG 24 hr tablet  Take 25 mg by mouth daily.     Marland Kitchen  PRAVASTATIN SODIUM PO  Take by mouth daily.     .  prasugrel (EFFIENT) 10 MG TABS  Take 10 mg by mouth daily.     Physical Examination  Filed Vitals:   05/29/11 1208  BP: 177/75  Pulse: 67  Temp: 97.4 F (36.3 C)  TempSrc: Oral  Resp: 16  Height: 5\' 6"  (1.676 m)  Weight: 164 lb (74.39 kg)  General: Alert and oriented, no acute distress  HEENT: Normal  Pulmonary: Clear to auscultation bilaterally  Cardiac: Regular Rate and Rhythm without murmur  Abdomen: Soft nontender nondistended no evidence of hernia  Neurologic: Upper and lower extremity motor 5/5 and symmetric  Extremities: 2+ femoral with absent popliteal and pedal pulses bilaterally  Skin: no ulcer or rash right first toe slightly dusky  DATA:  I reviewed and interpreted his ABIs from today. ABI on the left was 0.77 with monophasic waveforms on the right was 0.43 with monophasic waveforms  ASSESSMENT: Occlusion of right femoropopliteal bypass but does not have critical ischemia at this point.  PLAN: Will place., Martin [name] in graft surveillance protocol at this point. He was counseled today on several dietary changes as he is gaining some weight. I also talked with him about walking 30 minutes daily. If he just discovers any wounds on his foot he'll present for followup sooner.   Fabienne Bruns, MD  [facility] [place]  Office: [phone]  Pager: [phone]

## 2011-05-29 NOTE — Progress Notes (Signed)
ABI performed @ [facility] 05/29/2011

## 2011-07-24 DIAGNOSIS — I10 Essential (primary) hypertension: Secondary | ICD-10-CM | POA: Diagnosis not present

## 2011-07-24 DIAGNOSIS — I2510 Atherosclerotic heart disease of native coronary artery without angina pectoris: Secondary | ICD-10-CM | POA: Diagnosis not present

## 2011-07-24 DIAGNOSIS — E785 Hyperlipidemia, unspecified: Secondary | ICD-10-CM | POA: Diagnosis not present

## 2011-12-02 DIAGNOSIS — Z23 Encounter for immunization: Secondary | ICD-10-CM | POA: Diagnosis not present

## 2012-02-10 DIAGNOSIS — I10 Essential (primary) hypertension: Secondary | ICD-10-CM | POA: Diagnosis not present

## 2012-02-10 DIAGNOSIS — R7309 Other abnormal glucose: Secondary | ICD-10-CM | POA: Diagnosis not present

## 2012-02-10 DIAGNOSIS — E785 Hyperlipidemia, unspecified: Secondary | ICD-10-CM | POA: Diagnosis not present

## 2012-02-10 DIAGNOSIS — I2510 Atherosclerotic heart disease of native coronary artery without angina pectoris: Secondary | ICD-10-CM | POA: Diagnosis not present

## 2012-02-10 DIAGNOSIS — I739 Peripheral vascular disease, unspecified: Secondary | ICD-10-CM | POA: Diagnosis not present

## 2012-02-19 DIAGNOSIS — R7309 Other abnormal glucose: Secondary | ICD-10-CM | POA: Diagnosis not present

## 2012-02-19 DIAGNOSIS — R7989 Other specified abnormal findings of blood chemistry: Secondary | ICD-10-CM | POA: Diagnosis not present

## 2012-05-18 ENCOUNTER — Other Ambulatory Visit: Payer: Self-pay | Admitting: *Deleted

## 2012-05-18 DIAGNOSIS — Z48812 Encounter for surgical aftercare following surgery on the circulatory system: Secondary | ICD-10-CM

## 2012-05-18 DIAGNOSIS — I739 Peripheral vascular disease, unspecified: Secondary | ICD-10-CM

## 2012-05-27 ENCOUNTER — Ambulatory Visit: Payer: Medicare Other | Admitting: Neurosurgery

## 2012-05-27 ENCOUNTER — Encounter (INDEPENDENT_AMBULATORY_CARE_PROVIDER_SITE_OTHER): Payer: Medicare Other | Admitting: *Deleted

## 2012-05-27 DIAGNOSIS — I739 Peripheral vascular disease, unspecified: Secondary | ICD-10-CM

## 2012-05-28 ENCOUNTER — Other Ambulatory Visit: Payer: Self-pay

## 2012-05-28 DIAGNOSIS — Z48812 Encounter for surgical aftercare following surgery on the circulatory system: Secondary | ICD-10-CM

## 2012-05-28 DIAGNOSIS — I70219 Atherosclerosis of native arteries of extremities with intermittent claudication, unspecified extremity: Secondary | ICD-10-CM

## 2012-06-02 ENCOUNTER — Encounter: Payer: Self-pay | Admitting: Vascular Surgery

## 2012-06-11 ENCOUNTER — Ambulatory Visit (INDEPENDENT_AMBULATORY_CARE_PROVIDER_SITE_OTHER): Payer: Medicare Other | Admitting: Family Medicine

## 2012-06-11 ENCOUNTER — Encounter: Payer: Self-pay | Admitting: Family Medicine

## 2012-06-11 VITALS — BP 130/74 | HR 62 | Temp 97.9°F | Resp 16 | Wt 149.0 lb

## 2012-06-11 DIAGNOSIS — E119 Type 2 diabetes mellitus without complications: Secondary | ICD-10-CM | POA: Diagnosis not present

## 2012-06-11 NOTE — Progress Notes (Signed)
Subjective:     Patient ID: Martin [name], male   DOB: Oct 13, 1930, 79 y.o.   MRN: [id number]  HPI  Patient is here today for followup. He was seen in December and found to have a hemoglobin A1c of 6.8. At that time he elected to change his diet to a low carbohydrate diet, increase his aerobic exercise, and focus on weight loss. He is here today to recheck his hemoglobin A1c. Of note he has lost 5 pounds. He states he is exercising 15 minutes every morning in addition to playing golf every day.  He denies any polyuria or polydipsia.  Past Medical History  Diagnosis Date  . Hypertension   . Hyperlipidemia   . Myocardial infarction     1978 & 2011  . COPD (chronic obstructive pulmonary disease)   . Peripheral vascular disease, unspecified   . GERD (gastroesophageal reflux disease)   . Arthritis   . H/O: GI bleed   . Hiatal hernia   . Diabetes mellitus   . Bronchitis   . Renal artery stenosis    Current Outpatient Prescriptions on File Prior to Visit  Medication Sig Dispense Refill  . ALPRAZolam (XANAX) 0.5 MG tablet Take 0.5 mg by mouth 2 (two) times daily as needed.        Marland Kitchen aspirin 81 MG tablet Take 81 mg by mouth daily.        . budesonide-formoterol (SYMBICORT) 160-4.5 MCG/ACT inhaler Inhale 2 puffs into the lungs 2 (two) times daily.        Marland Kitchen diltiazem (CARDIZEM CD) 120 MG 24 hr capsule       . famotidine (PEPCID) 20 MG tablet Take 20 mg by mouth 2 (two) times daily.        Marland Kitchen losartan (COZAAR) 50 MG tablet Take 50 mg by mouth daily.        . metoprolol succinate (TOPROL-XL) 25 MG 24 hr tablet Take 25 mg by mouth daily.         No current facility-administered medications on file prior to visit.   History   Social History  . Marital Status: Widowed    Spouse Name: N/A    Number of Children: N/A  . Years of Education: N/A   Occupational History  . Not on file.   Social History Main Topics  . Smoking status: Former Smoker -- 65 years    Types: Cigarettes    Quit date:  07/08/2009  . Smokeless tobacco: Never Used  . Alcohol Use: No  . Drug Use: No  . Sexually Active: Not on file   Other Topics Concern  . Not on file   Social History Narrative  . No narrative on file    Review of Systems  Constitutional: Negative.   HENT: Negative.   Eyes: Negative.   Respiratory: Negative.   Cardiovascular: Negative.   Gastrointestinal: Negative.   Genitourinary: Negative.   Neurological: Negative.        Objective:   Physical Exam  HENT:  Head: Normocephalic and atraumatic.  Eyes: Conjunctivae and EOM are normal. Pupils are equal, round, and reactive to light.  Neck: Normal range of motion. Neck supple. No thyromegaly present.  Cardiovascular: Normal rate, regular rhythm and normal heart sounds.   No murmur heard. Pulmonary/Chest: Effort normal and breath sounds normal. No respiratory distress. He has no wheezes. He has no rales.  Abdominal: Soft. Bowel sounds are normal. He exhibits no distension. There is no tenderness. There is no rebound and no  guarding.       Assessment:     Diabetes mellitus type 2    Plan:     Check hemoglobin A1c. Goal is less than 6.5. If greater than 6.5 would consider starting Januvia versus metformin.   again reiterated a low carbohydrate diet and therapeutic lifestyle changes to help control this.

## 2012-06-16 ENCOUNTER — Encounter: Payer: Self-pay | Admitting: Vascular Surgery

## 2012-06-17 ENCOUNTER — Encounter: Payer: Self-pay | Admitting: Vascular Surgery

## 2012-06-17 ENCOUNTER — Ambulatory Visit (INDEPENDENT_AMBULATORY_CARE_PROVIDER_SITE_OTHER): Payer: Medicare Other | Admitting: Vascular Surgery

## 2012-06-17 VITALS — BP 176/69 | HR 65 | Ht 66.0 in | Wt 149.5 lb

## 2012-06-17 DIAGNOSIS — I70219 Atherosclerosis of native arteries of extremities with intermittent claudication, unspecified extremity: Secondary | ICD-10-CM | POA: Insufficient documentation

## 2012-06-17 NOTE — Progress Notes (Signed)
Patient is a 79 year old male who returns for followup today. He previously underwent aortobifemoral bypass graft and right femoral popliteal bypass grafting in May of 2012. His right femoropopliteal has been chronically occluded. He has chronic intermittent stable claudication in the right leg. He denies rest pain. He denies nonhealing wounds on the foot. He overall is fairly active and plays golf 3 times a week. He states that currently he can walk 150 yards before experiencing right leg symptoms. Chronic medical problems include hypertension hyperlipidemia coronary artery disease COPD all of which are currently stable.  Current Outpatient Prescriptions on File Prior to Visit  Medication Sig Dispense Refill  . ALPRAZolam (XANAX) 0.5 MG tablet Take 0.5 mg by mouth 2 (two) times daily as needed.        Marland Kitchen aspirin 81 MG tablet Take 81 mg by mouth daily.        . budesonide-formoterol (SYMBICORT) 160-4.5 MCG/ACT inhaler Inhale 2 puffs into the lungs 2 (two) times daily.        Marland Kitchen diltiazem (CARDIZEM CD) 120 MG 24 hr capsule       . famotidine (PEPCID) 20 MG tablet Take 20 mg by mouth 2 (two) times daily.        Marland Kitchen losartan (COZAAR) 50 MG tablet Take 50 mg by mouth daily.        . metoprolol succinate (TOPROL-XL) 25 MG 24 hr tablet Take 25 mg by mouth daily.        . pravastatin (PRAVACHOL) 80 MG tablet Take 80 mg by mouth daily.       No current facility-administered medications on file prior to visit.     Past Surgical History  Procedure Laterality Date  . Aortobifemoral bpg  07/22/10    and Right femoral-popliteal BPG   Past Medical History  Diagnosis Date  . Hypertension   . Hyperlipidemia   . Myocardial infarction     1978 & 2011  . COPD (chronic obstructive pulmonary disease)   . Peripheral vascular disease, unspecified   . GERD (gastroesophageal reflux disease)   . Arthritis   . H/O: GI bleed   . Hiatal hernia   . Diabetes mellitus   . Bronchitis   . Renal artery stenosis     Review of systems: He has shortness of breath with severe exertion. He denies chest pain.  Physical exam:  Filed Vitals:   06/17/12 1519  BP: 176/69  Pulse: 65  Height: 5\' 6"  (1.676 m)  Weight: 149 lb 8 oz (67.813 kg)  SpO2: 100%   Abdomen: Soft nontender nondistended no pulsatile mass well-healed laparotomy incision 2+ femoral pulses bilaterally  Extremities: Absent popliteal and pedal pulses bilaterally feet pink with 3 second capillary refill and symmetric  Data: Patient had bilateral ABIs performed on 05/27/2012. These were reviewed today. ABI on the right was 0.48 left was 0.71 toe pressure on the right 66 toe pressure on the left 102  Assessment: Right lower extremity claudication with patent aortobifemoral bypass graft. The patient currently does not feel he is limited by his claudication symptoms and prefers conservative management at this point. He is on aspirin and a cholesterol-lowering agent.  Plan: Followup ABIs and office visit in 6 months  Fabienne Bruns, MD [facility] [place] Office: [phone] Pager: [phone]

## 2012-07-19 ENCOUNTER — Telehealth: Payer: Self-pay | Admitting: Family Medicine

## 2012-07-19 ENCOUNTER — Other Ambulatory Visit: Payer: Self-pay | Admitting: Family Medicine

## 2012-07-19 NOTE — Telephone Encounter (Signed)
Alprazolam 5mg  take 1 tablet at bedtime as needed last office visit 06/11/12 #30

## 2012-07-19 NOTE — Telephone Encounter (Signed)
Ok to fill 

## 2012-07-19 NOTE — Telephone Encounter (Signed)
Ok to refill 

## 2012-07-19 NOTE — Telephone Encounter (Signed)
?   OK to Refill  

## 2012-09-01 ENCOUNTER — Other Ambulatory Visit: Payer: Self-pay | Admitting: Family Medicine

## 2012-09-13 ENCOUNTER — Telehealth: Payer: Self-pay | Admitting: Family Medicine

## 2012-09-13 MED ORDER — DILTIAZEM HCL ER COATED BEADS 120 MG PO CP24: 120.0000 mg | ORAL_CAPSULE | Freq: Every day | ORAL | Status: DC

## 2012-09-13 NOTE — Telephone Encounter (Signed)
Rx Refilled  

## 2012-09-20 ENCOUNTER — Other Ambulatory Visit: Payer: Self-pay | Admitting: Family Medicine

## 2012-09-20 NOTE — Telephone Encounter (Signed)
?   OK to Refill  

## 2012-09-20 NOTE — Telephone Encounter (Signed)
Med called in

## 2012-09-20 NOTE — Telephone Encounter (Signed)
Ok to refill 

## 2012-10-13 ENCOUNTER — Telehealth: Payer: Self-pay | Admitting: Family Medicine

## 2012-10-13 MED ORDER — NITROGLYCERIN 0.4 MG SL SUBL: 0.4000 mg | SUBLINGUAL_TABLET | SUBLINGUAL | Status: DC

## 2012-10-13 NOTE — Telephone Encounter (Signed)
Rx Refilled  

## 2012-11-03 ENCOUNTER — Other Ambulatory Visit: Payer: Self-pay | Admitting: Family Medicine

## 2012-11-25 ENCOUNTER — Other Ambulatory Visit: Payer: Self-pay | Admitting: Family Medicine

## 2012-11-25 NOTE — Telephone Encounter (Signed)
Meds refilled.

## 2012-11-25 NOTE — Telephone Encounter (Signed)
ok 

## 2012-11-25 NOTE — Telephone Encounter (Signed)
?   OK to Refill  

## 2012-12-09 ENCOUNTER — Ambulatory Visit (INDEPENDENT_AMBULATORY_CARE_PROVIDER_SITE_OTHER): Payer: Medicare Other | Admitting: Family Medicine

## 2012-12-09 DIAGNOSIS — Z23 Encounter for immunization: Secondary | ICD-10-CM | POA: Diagnosis not present

## 2012-12-15 ENCOUNTER — Other Ambulatory Visit: Payer: Self-pay | Admitting: Family Medicine

## 2012-12-22 ENCOUNTER — Encounter: Payer: Self-pay | Admitting: Vascular Surgery

## 2012-12-22 ENCOUNTER — Encounter: Payer: Self-pay | Admitting: Family

## 2012-12-23 ENCOUNTER — Ambulatory Visit (HOSPITAL_COMMUNITY)
Admission: RE | Admit: 2012-12-23 | Discharge: 2012-12-23 | Disposition: A | Discharge status: Home / Self Care | Payer: Medicare Other | Source: Ambulatory Visit | Attending: Family | Admitting: Family

## 2012-12-23 ENCOUNTER — Ambulatory Visit (INDEPENDENT_AMBULATORY_CARE_PROVIDER_SITE_OTHER): Payer: Medicare Other | Admitting: Vascular Surgery

## 2012-12-23 ENCOUNTER — Encounter: Payer: Self-pay | Admitting: Family

## 2012-12-23 VITALS — BP 192/77 | HR 70 | Resp 16 | Ht 66.0 in | Wt 146.0 lb

## 2012-12-23 DIAGNOSIS — Z48812 Encounter for surgical aftercare following surgery on the circulatory system: Secondary | ICD-10-CM

## 2012-12-23 DIAGNOSIS — I70219 Atherosclerosis of native arteries of extremities with intermittent claudication, unspecified extremity: Secondary | ICD-10-CM

## 2012-12-23 NOTE — Progress Notes (Signed)
Patient is a 79 year old male who returns for followup today. He previously underwent aortobifemoral bypass graft and right femoral popliteal bypass grafting in May of 2012. His right femoropopliteal has been chronically occluded. He has chronic intermittent stable claudication in the right leg. He denies rest pain. He denies nonhealing wounds on the foot. He overall is fairly active and plays golf 3 times a week. He states that currently he can walk 50 yards before experiencing right leg symptoms. Chronic medical problems include hypertension hyperlipidemia coronary artery disease COPD all of which are currently stable.    Current Outpatient Prescriptions on File Prior to Visit  Medication Sig Dispense Refill  . ALPRAZolam (XANAX) 0.5 MG tablet TAKE ONE TABLET BY MOUTH AT BEDTIME AS NEEDED FOR INSOMNIA.  30 tablet  0  . aspirin 81 MG tablet Take 81 mg by mouth daily.        Marland Kitchen diltiazem (CARDIZEM CD) 120 MG 24 hr capsule Take 1 capsule (120 mg total) by mouth daily.  30 capsule  5  . famotidine (PEPCID) 20 MG tablet Take 20 mg by mouth 2 (two) times daily.        Marland Kitchen losartan (COZAAR) 50 MG tablet TAKE ONE TABLET BY MOUTH EVERY DAY  30 tablet  3  . metoprolol succinate (TOPROL-XL) 25 MG 24 hr tablet TAKE ONE TABLET BY MOUTH ONCE DAILY.  30 tablet  0  . nitroGLYCERIN (NITROSTAT) 0.4 MG SL tablet Place 1 tablet (0.4 mg total) under the tongue as directed.  25 tablet  1  . pravastatin (PRAVACHOL) 80 MG tablet Take 80 mg by mouth daily.      . SYMBICORT 160-4.5 MCG/ACT inhaler INHALE ONE PUFF BY MOUTH TWICE DAILY  10.2 g  11   No current facility-administered medications on file prior to visit.    Allergies  Allergen Reactions  . Penicillins    Past Surgical History  Procedure Laterality Date  . Aortobifemoral bpg  07/22/10    and Right femoral-popliteal BPG   Past Medical History  Diagnosis Date  . Hypertension   . Hyperlipidemia   . Myocardial infarction     1978 & 2011  . COPD (chronic  obstructive pulmonary disease)   . Peripheral vascular disease, unspecified   . GERD (gastroesophageal reflux disease)   . Arthritis   . H/O: GI bleed   . Hiatal hernia   . Diabetes mellitus   . Bronchitis   . Renal artery stenosis     Review of systems: He has shortness of breath with severe exertion. He denies chest pain.  Physical exam:    Filed Vitals:   12/23/12 1229  BP: 192/77  Pulse: 70  Resp: 16  Height: 5\' 6"  (1.676 m)  Weight: 146 lb (66.225 kg)  SpO2: 98%   Abdomen: Soft nontender nondistended no pulsatile mass well-healed laparotomy incision 2+ femoral pulses bilaterally  Extremities: Absent popliteal and pedal pulses bilaterally feet pink with 3 second capillary refill and symmetric  Data: 0.45 on the right 0.72 on the left toe pressure 104 on the left 66 on the right. This is essentially unchanged from 6 months ago.  Assessment: Right lower extremity claudication with patent aortobifemoral bypass graft. The patient currently does not feel he is limited by his claudication symptoms and prefers conservative management at this point. He is on aspirin and a cholesterol-lowering agent.  Plan: Followup ABIs and office visit in 12 months  Fabienne Bruns, MD [facility] [place] Office: [phone] Pager:  [phone]  

## 2012-12-27 ENCOUNTER — Ambulatory Visit (INDEPENDENT_AMBULATORY_CARE_PROVIDER_SITE_OTHER): Payer: Medicare Other | Admitting: Family Medicine

## 2012-12-27 ENCOUNTER — Encounter: Payer: Self-pay | Admitting: Family Medicine

## 2012-12-27 VITALS — BP 142/86 | HR 64 | Temp 97.7°F | Resp 18 | Wt 147.0 lb

## 2012-12-27 DIAGNOSIS — E785 Hyperlipidemia, unspecified: Secondary | ICD-10-CM | POA: Diagnosis not present

## 2012-12-27 DIAGNOSIS — I10 Essential (primary) hypertension: Secondary | ICD-10-CM

## 2012-12-27 DIAGNOSIS — I739 Peripheral vascular disease, unspecified: Secondary | ICD-10-CM | POA: Diagnosis not present

## 2012-12-27 DIAGNOSIS — E119 Type 2 diabetes mellitus without complications: Secondary | ICD-10-CM

## 2012-12-27 NOTE — Progress Notes (Signed)
Subjective:    Patient ID: Martin [name], male    DOB: 09/24/1930, 79 y.o.   MRN: [id number]  HPI Patient history peripheral vascular disease status post right lower extremity bypass. Semisolid Dr. Darrick Penna who recommended continued medical management. The patient on aspirin 81 mg by mouth daily. He's also on statin therapy with goal LDL less than 70 due to his peripheral vascular disease. Recently in April, determined that the patient was a prediabetic with a hemoglobin A1c of 6.4. He has been trying to increase his aerobic exercise and decrease his carbohydrates in his diet. He is due today to recheck hemoglobin A1c. He has a history of hypertension. His blood pressure at home has been ranging 120-130/80 his he denies any chest pain, shortness of breath, dyspnea on exertion. His blood pressure today in clinic is slightly elevated although he states that this usually happens when he comes to the doctor because he is slightly anxious. He denies any right upper quadrant pain or myalgia on his statin. He is overdue check a fasting lipid panel and CMP. He also has a history of upper GI bleed. His on aspirin due to his diabetes and peripheral vascular disease. He is overdue check a CBC. He recently received his flu vaccine. Past Medical History  Diagnosis Date  . Hypertension   . Hyperlipidemia   . Myocardial infarction     1978 & 2011  . COPD (chronic obstructive pulmonary disease)   . Peripheral vascular disease, unspecified   . GERD (gastroesophageal reflux disease)   . Arthritis   . H/O: GI bleed   . Hiatal hernia   . Diabetes mellitus   . Bronchitis   . Renal artery stenosis    Past Surgical History  Procedure Laterality Date  . Aortobifemoral bpg  07/22/10    and Right femoral-popliteal BPG   Current Outpatient Prescriptions on File Prior to Visit  Medication Sig Dispense Refill  . ALPRAZolam (XANAX) 0.5 MG tablet TAKE ONE TABLET BY MOUTH AT BEDTIME AS NEEDED FOR INSOMNIA.  30 tablet  0   . aspirin 81 MG tablet Take 81 mg by mouth daily.        Marland Kitchen diltiazem (CARDIZEM CD) 120 MG 24 hr capsule Take 1 capsule (120 mg total) by mouth daily.  30 capsule  5  . famotidine (PEPCID) 20 MG tablet Take 20 mg by mouth 2 (two) times daily.        Marland Kitchen losartan (COZAAR) 50 MG tablet TAKE ONE TABLET BY MOUTH EVERY DAY  30 tablet  3  . metoprolol succinate (TOPROL-XL) 25 MG 24 hr tablet TAKE ONE TABLET BY MOUTH ONCE DAILY.  30 tablet  0  . nitroGLYCERIN (NITROSTAT) 0.4 MG SL tablet Place 1 tablet (0.4 mg total) under the tongue as directed.  25 tablet  1  . pravastatin (PRAVACHOL) 80 MG tablet Take 80 mg by mouth daily.      . SYMBICORT 160-4.5 MCG/ACT inhaler INHALE ONE PUFF BY MOUTH TWICE DAILY  10.2 g  11   No current facility-administered medications on file prior to visit.   Allergies  Allergen Reactions  . Penicillins    History   Social History  . Marital Status: Widowed    Spouse Name: N/A    Number of Children: N/A  . Years of Education: N/A   Occupational History  . Not on file.   Social History Main Topics  . Smoking status: Former Smoker -- 65 years    Types: Cigarettes  Quit date: 07/08/2009  . Smokeless tobacco: Never Used  . Alcohol Use: No  . Drug Use: No  . Sexual Activity: Not on file   Other Topics Concern  . Not on file   Social History Narrative  . No narrative on file      Review of Systems  All other systems reviewed and are negative.       Objective:   Physical Exam  Vitals reviewed. Constitutional: He appears well-developed and well-nourished. No distress.  HENT:  Nose: Nose normal.  Mouth/Throat: Oropharynx is clear and moist. No oropharyngeal exudate.  Eyes: Conjunctivae are normal. No scleral icterus.  Neck: Neck supple. No JVD present. No thyromegaly present.  Cardiovascular: Normal rate, regular rhythm and normal heart sounds.  Exam reveals no gallop and no friction rub.   No murmur heard. Pulmonary/Chest: Breath sounds  normal. No respiratory distress. He has no wheezes. He has no rales. He exhibits no tenderness.  Abdominal: Soft. Bowel sounds are normal. He exhibits no distension and no mass. There is no tenderness. There is no rebound and no guarding.  Musculoskeletal: He exhibits no edema.  Lymphadenopathy:    He has no cervical adenopathy.  Skin: He is not diaphoretic.          Assessment & Plan:  1. Type II or unspecified type diabetes mellitus without mention of complication, not stated as uncontrolled Check hemoglobin A1c.  I reviewed a low carbohydrate diet and recommended daily aerobic exercise. - CBC with Differential; Future - COMPLETE METABOLIC PANEL WITH GFR; Future - Hemoglobin A1c; Future  2. HTN (hypertension) Blood pressure is slightly elevated. I asked the patient check his blood pressure daily. If it is consistently greater than 140/90 I would increase his losartan to 100 mg by mouth daily - COMPLETE METABOLIC PANEL WITH GFR; Future  3. HLD (hyperlipidemia) Return fasting for a fasting lipid panel. Goal LDL is less than 70. - COMPLETE METABOLIC PANEL WITH GFR; Future - Lipid panel; Future  4. PVD (peripheral vascular disease) Check fasting lipid panel. Patient's goal LDL is less than 70. - Hemoglobin A1c; Future

## 2012-12-28 ENCOUNTER — Other Ambulatory Visit: Payer: Medicare Other

## 2012-12-28 DIAGNOSIS — I10 Essential (primary) hypertension: Secondary | ICD-10-CM

## 2012-12-28 DIAGNOSIS — E785 Hyperlipidemia, unspecified: Secondary | ICD-10-CM

## 2012-12-28 DIAGNOSIS — I739 Peripheral vascular disease, unspecified: Secondary | ICD-10-CM | POA: Diagnosis not present

## 2012-12-28 DIAGNOSIS — E119 Type 2 diabetes mellitus without complications: Secondary | ICD-10-CM

## 2012-12-28 LAB — CBC WITH DIFFERENTIAL/PLATELET
Basophils Relative: 1 % (ref 0–1)
HCT: 46.8 % (ref 39.0–52.0)
Hemoglobin: 15.7 g/dL (ref 13.0–17.0)
Lymphocytes Relative: 23 % (ref 12–46)
Lymphs Abs: 1.7 10*3/uL (ref 0.7–4.0)
MCHC: 33.5 g/dL (ref 30.0–36.0)
Monocytes Absolute: 0.6 10*3/uL (ref 0.1–1.0)
Monocytes Relative: 8 % (ref 3–12)
Neutro Abs: 5 10*3/uL (ref 1.7–7.7)
Neutrophils Relative %: 66 % (ref 43–77)
RBC: 4.93 MIL/uL (ref 4.22–5.81)
WBC: 7.5 10*3/uL (ref 4.0–10.5)

## 2012-12-28 LAB — HEMOGLOBIN A1C
Hgb A1c MFr Bld: 6.4 % — ABNORMAL HIGH (ref <5.7)
Mean Plasma Glucose: 137 mg/dL — ABNORMAL HIGH (ref <117)

## 2012-12-28 LAB — COMPLETE METABOLIC PANEL WITH GFR
Albumin: 4.4 g/dL (ref 3.5–5.2)
Alkaline Phosphatase: 76 U/L (ref 39–117)
BUN: 26 mg/dL — ABNORMAL HIGH (ref 6–23)
Calcium: 9.8 mg/dL (ref 8.4–10.5)
Chloride: 101 mEq/L (ref 96–112)
GFR, Est African American: 50 mL/min — ABNORMAL LOW
GFR, Est Non African American: 43 mL/min — ABNORMAL LOW
Glucose, Bld: 132 mg/dL — ABNORMAL HIGH (ref 70–99)
Potassium: 4.8 mEq/L (ref 3.5–5.3)
Sodium: 138 mEq/L (ref 135–145)
Total Bilirubin: 0.7 mg/dL (ref 0.3–1.2)
Total Protein: 7.2 g/dL (ref 6.0–8.3)

## 2012-12-28 LAB — LIPID PANEL
HDL: 46 mg/dL (ref >39)
Total CHOL/HDL Ratio: 4.3 Ratio
VLDL: 46 mg/dL — ABNORMAL HIGH (ref 0–40)

## 2012-12-30 ENCOUNTER — Other Ambulatory Visit: Payer: Self-pay | Admitting: Family Medicine

## 2013-01-12 DIAGNOSIS — Z961 Presence of intraocular lens: Secondary | ICD-10-CM | POA: Diagnosis not present

## 2013-01-12 DIAGNOSIS — H43819 Vitreous degeneration, unspecified eye: Secondary | ICD-10-CM | POA: Diagnosis not present

## 2013-01-12 DIAGNOSIS — H0230 Blepharochalasis unspecified eye, unspecified eyelid: Secondary | ICD-10-CM | POA: Diagnosis not present

## 2013-01-12 DIAGNOSIS — H02409 Unspecified ptosis of unspecified eyelid: Secondary | ICD-10-CM | POA: Diagnosis not present

## 2013-01-12 DIAGNOSIS — H01029 Squamous blepharitis unspecified eye, unspecified eyelid: Secondary | ICD-10-CM | POA: Diagnosis not present

## 2013-01-12 DIAGNOSIS — H4320 Crystalline deposits in vitreous body, unspecified eye: Secondary | ICD-10-CM | POA: Diagnosis not present

## 2013-01-20 ENCOUNTER — Other Ambulatory Visit: Payer: Self-pay | Admitting: Family Medicine

## 2013-01-25 ENCOUNTER — Other Ambulatory Visit: Payer: Self-pay | Admitting: Family Medicine

## 2013-02-16 ENCOUNTER — Telehealth: Payer: Self-pay | Admitting: *Deleted

## 2013-02-16 NOTE — Telephone Encounter (Signed)
Patient called in complaining of carpal tunnel syndrome bilaterally, gave him referral info to Drs. Kuzma and Sypher. He will call them himself for an appt.

## 2013-03-04 ENCOUNTER — Encounter: Payer: Self-pay | Admitting: Family Medicine

## 2013-03-04 ENCOUNTER — Other Ambulatory Visit: Payer: Self-pay | Admitting: Family Medicine

## 2013-03-04 NOTE — Telephone Encounter (Signed)
Medication refill for one time only.  Patient needs to be seen.  Letter sent for patient to call and schedule 

## 2013-03-11 ENCOUNTER — Telehealth: Payer: Self-pay | Admitting: Family Medicine

## 2013-03-11 ENCOUNTER — Other Ambulatory Visit: Payer: Self-pay | Admitting: Family Medicine

## 2013-03-11 MED ORDER — ALPRAZOLAM 0.5 MG PO TABS: 0.5000 mg | ORAL_TABLET | Freq: Every evening | ORAL | Status: DC | PRN

## 2013-03-11 NOTE — Telephone Encounter (Signed)
RX called in .

## 2013-03-11 NOTE — Telephone Encounter (Signed)
Req Rf Xanax  Last RF 11/18 #30  Last OV 12/27/12  OK refill?

## 2013-03-11 NOTE — Telephone Encounter (Signed)
ok 

## 2013-03-31 ENCOUNTER — Other Ambulatory Visit: Payer: Self-pay | Admitting: Family Medicine

## 2013-04-01 ENCOUNTER — Ambulatory Visit (INDEPENDENT_AMBULATORY_CARE_PROVIDER_SITE_OTHER): Payer: Medicare Other | Admitting: Family Medicine

## 2013-04-01 ENCOUNTER — Encounter: Payer: Self-pay | Admitting: Family Medicine

## 2013-04-01 VITALS — BP 190/80 | HR 76 | Temp 97.1°F | Resp 18 | Ht 66.0 in | Wt 144.0 lb

## 2013-04-01 DIAGNOSIS — I739 Peripheral vascular disease, unspecified: Secondary | ICD-10-CM | POA: Diagnosis not present

## 2013-04-01 DIAGNOSIS — E119 Type 2 diabetes mellitus without complications: Secondary | ICD-10-CM | POA: Diagnosis not present

## 2013-04-01 DIAGNOSIS — E785 Hyperlipidemia, unspecified: Secondary | ICD-10-CM | POA: Diagnosis not present

## 2013-04-01 DIAGNOSIS — I10 Essential (primary) hypertension: Secondary | ICD-10-CM

## 2013-04-01 LAB — CBC WITH DIFFERENTIAL/PLATELET
Basophils Absolute: 0 10*3/uL (ref 0.0–0.1)
Basophils Relative: 0 % (ref 0–1)
EOS ABS: 0.1 10*3/uL (ref 0.0–0.7)
Eosinophils Relative: 1 % (ref 0–5)
HEMATOCRIT: 44.5 % (ref 39.0–52.0)
Hemoglobin: 15.5 g/dL (ref 13.0–17.0)
Lymphocytes Relative: 18 % (ref 12–46)
Lymphs Abs: 1.4 10*3/uL (ref 0.7–4.0)
MCH: 32 pg (ref 26.0–34.0)
MCHC: 34.8 g/dL (ref 30.0–36.0)
MCV: 91.8 fL (ref 78.0–100.0)
MONOS PCT: 6 % (ref 3–12)
Monocytes Absolute: 0.5 10*3/uL (ref 0.1–1.0)
Neutro Abs: 6.1 10*3/uL (ref 1.7–7.7)
Neutrophils Relative %: 75 % (ref 43–77)
PLATELETS: 173 10*3/uL (ref 150–400)
RBC: 4.85 MIL/uL (ref 4.22–5.81)
RDW: 13.5 % (ref 11.5–15.5)
WBC: 8.1 10*3/uL (ref 4.0–10.5)

## 2013-04-01 LAB — COMPLETE METABOLIC PANEL WITH GFR
ALT: 12 U/L (ref 0–53)
AST: 18 U/L (ref 0–37)
Albumin: 4.3 g/dL (ref 3.5–5.2)
Alkaline Phosphatase: 84 U/L (ref 39–117)
BILIRUBIN TOTAL: 0.6 mg/dL (ref 0.3–1.2)
BUN: 25 mg/dL — AB (ref 6–23)
CALCIUM: 9.5 mg/dL (ref 8.4–10.5)
CO2: 27 meq/L (ref 19–32)
Chloride: 103 mEq/L (ref 96–112)
Creat: 1.38 mg/dL — ABNORMAL HIGH (ref 0.50–1.35)
GFR, EST AFRICAN AMERICAN: 55 mL/min — AB
GFR, EST NON AFRICAN AMERICAN: 48 mL/min — AB
GLUCOSE: 128 mg/dL — AB (ref 70–99)
Potassium: 4.5 mEq/L (ref 3.5–5.3)
Sodium: 139 mEq/L (ref 135–145)
Total Protein: 7.2 g/dL (ref 6.0–8.3)

## 2013-04-01 LAB — HEMOGLOBIN A1C
Hgb A1c MFr Bld: 6.1 % — ABNORMAL HIGH (ref <5.7)
Mean Plasma Glucose: 128 mg/dL — ABNORMAL HIGH (ref <117)

## 2013-04-01 LAB — LIPID PANEL
Cholesterol: 172 mg/dL (ref 0–200)
HDL: 49 mg/dL (ref >39)
LDL Cholesterol: 80 mg/dL (ref 0–99)
TRIGLYCERIDES: 215 mg/dL — AB (ref <150)
Total CHOL/HDL Ratio: 3.5 Ratio
VLDL: 43 mg/dL — ABNORMAL HIGH (ref 0–40)

## 2013-04-01 MED ORDER — PRAVASTATIN SODIUM 80 MG PO TABS: 80.0000 mg | ORAL_TABLET | Freq: Every day | ORAL | Status: DC

## 2013-04-01 NOTE — Progress Notes (Signed)
Subjective:    Patient ID: Martin [name], male    DOB: [birth year], 80 y.o.   MRN: [id number]  HPI 12/27/12 Patient history peripheral vascular disease status post right lower extremity bypass. He sees Dr. Oneida Alar who recommended continued medical management. The patient on aspirin 81 mg by mouth daily. He's also on statin therapy with goal LDL less than 70 due to his peripheral vascular disease. Recently in April, determined that the patient was a prediabetic with a hemoglobin A1c of 6.4. He has been trying to increase his aerobic exercise and decrease his carbohydrates in his diet. He is due today to recheck hemoglobin A1c. He has a history of hypertension. His blood pressure at home has been ranging 120-130/80 his he denies any chest pain, shortness of breath, dyspnea on exertion. His blood pressure today in clinic is slightly elevated although he states that this usually happens when he comes to the doctor because he is slightly anxious. He denies any right upper quadrant pain or myalgia on his statin. He is overdue check a fasting lipid panel and CMP. He also has a history of upper GI bleed. His on aspirin due to his diabetes and peripheral vascular disease. He is overdue check a CBC. He recently received his flu vaccine.  At that time, my plan was: 1. Type II or unspecified type diabetes mellitus without mention of complication, not stated as uncontrolled Check hemoglobin A1c.  I reviewed a low carbohydrate diet and recommended daily aerobic exercise. - CBC with Differential; Future - COMPLETE METABOLIC PANEL WITH GFR; Future - Hemoglobin A1c; Future  2. HTN (hypertension) Blood pressure is slightly elevated. I asked the patient check his blood pressure daily. If it is consistently greater than 140/90 I would increase his losartan to 100 mg by mouth daily - COMPLETE METABOLIC PANEL WITH GFR; Future  3. HLD (hyperlipidemia) Return fasting for a fasting lipid panel. Goal LDL is less than 70. -  COMPLETE METABOLIC PANEL WITH GFR; Future - Lipid panel; Future  4. PVD (peripheral vascular disease) Check fasting lipid panel. Patient's goal LDL is less than 70. - Hemoglobin A1c; Future  Patient is here today for recheck. His lab work last time revealed an elevated LDL cholesterol greater than 100. Due to his goal LDL being less than 70 I. recommended that he discontinue pravastatin and start Crestor. The patient did not do that and he discontinue pravastatin because it causes legs to hurt. Therefore he is on no statin at the present time.  Crestor made him feel terrible the past and he does not want to take it again.  His blood pressure is also extremely elevated today at 190/80. I rechecked his blood pressure myself and recorded 184/80. The patient states that his blood pressure is always high in the doctor's office and according to his blood pressure cuff at home which he uses on daily basis his blood pressure is less than 140/90. He states he has whitecoat hypertension. He denies any chest pain, shortness of breath, dyspnea on exertion.   Past Medical History  Diagnosis Date  . Hypertension   . Hyperlipidemia   . Myocardial infarction     1978 & 2011  . COPD (chronic obstructive pulmonary disease)   . Peripheral vascular disease, unspecified   . GERD (gastroesophageal reflux disease)   . Arthritis   . H/O: GI bleed   . Hiatal hernia   . Diabetes mellitus   . Bronchitis   . Renal artery stenosis  Past Surgical History  Procedure Laterality Date  . Aortobifemoral bpg  07/22/10    and Right femoral-popliteal BPG   Current outpatient prescriptions:ALPRAZolam (XANAX) 0.5 MG tablet, Take 1 tablet (0.5 mg total) by mouth at bedtime as needed for sleep., Disp: 30 tablet, Rfl: 0;  aspirin 81 MG tablet, Take 81 mg by mouth daily.  , Disp: , Rfl: ;  diltiazem (CARDIZEM CD) 120 MG 24 hr capsule, TAKE ONE CAPSULE BY MOUTH DAILY., Disp: 30 capsule, Rfl: 5;  famotidine (PEPCID) 20 MG tablet,  Take 20 mg by mouth 2 (two) times daily.  , Disp: , Rfl:  losartan (COZAAR) 50 MG tablet, TAKE ONE TABLET BY MOUTH ONCE DAILY., Disp: , Rfl: ;  metoprolol succinate (TOPROL-XL) 25 MG 24 hr tablet, TAKE ONE TABLET BY MOUTH ONCE DAILY. NEEDS OFFICE VISIT AND BLOOD WORK, Disp: 30 tablet, Rfl: 11;  nitroGLYCERIN (NITROSTAT) 0.4 MG SL tablet, Place 1 tablet (0.4 mg total) under the tongue as directed., Disp: 25 tablet, Rfl: 1 SYMBICORT 160-4.5 MCG/ACT inhaler, INHALE ONE PUFF BY MOUTH TWICE DAILY, Disp: 10.2 g, Rfl: 11;  pravastatin (PRAVACHOL) 80 MG tablet, Take 1 tablet (80 mg total) by mouth daily., Disp: 90 tablet, Rfl: 3   Allergies  Allergen Reactions  . Penicillins    History   Social History  . Marital Status: Widowed    Spouse Name: N/A    Number of Children: N/A  . Years of Education: N/A   Occupational History  . Not on file.   Social History Main Topics  . Smoking status: Former Smoker -- 22 years    Types: Cigarettes    Quit date: 07/08/2009  . Smokeless tobacco: Never Used  . Alcohol Use: No  . Drug Use: No  . Sexual Activity: Not on file   Other Topics Concern  . Not on file   Social History Narrative  . No narrative on file      Review of Systems  All other systems reviewed and are negative.       Objective:   Physical Exam  Vitals reviewed. Constitutional: He appears well-developed and well-nourished. No distress.  HENT:  Nose: Nose normal.  Mouth/Throat: Oropharynx is clear and moist. No oropharyngeal exudate.  Eyes: Conjunctivae are normal. No scleral icterus.  Neck: Neck supple. No JVD present. No thyromegaly present.  Cardiovascular: Normal rate, regular rhythm and normal heart sounds.  Exam reveals no gallop and no friction rub.   No murmur heard. Pulmonary/Chest: Breath sounds normal. No respiratory distress. He has no wheezes. He has no rales. He exhibits no tenderness.  Abdominal: Soft. Bowel sounds are normal. He exhibits no distension and  no mass. There is no tenderness. There is no rebound and no guarding.  Musculoskeletal: He exhibits no edema.  Lymphadenopathy:    He has no cervical adenopathy.  Skin: He is not diaphoretic.          Assessment & Plan:  1. Type II or unspecified type diabetes mellitus without mention of complication, not stated as uncontrolled  Check hemoglobin A1c. Goal hemoglobin A1c is less than 6.5. Also recommended Prevnar 13 but the patient declined today. - Hemoglobin A1c  2. HTN (hypertension) The patient is adamant that he has whitecoat hypertension. I have asked him to return next week with his blood pressure cuff is accurate.  If his cuff is inaccurate I will increase his medication and add clonidine 0.1 mg by mouth twice a day. - CBC with Differential - Lipid panel  3. HLD (hyperlipidemia) Check fasting lipid panel. I also started the patient on pravastatin 80 mg by mouth daily - Lipid panel  4. PVD (peripheral vascular disease) Goal LDL is less than 70. - CBC with Differential - COMPLETE METABOLIC PANEL WITH GFR - Lipid panel

## 2013-04-04 ENCOUNTER — Telehealth: Payer: Self-pay | Admitting: *Deleted

## 2013-04-04 ENCOUNTER — Ambulatory Visit (INDEPENDENT_AMBULATORY_CARE_PROVIDER_SITE_OTHER): Payer: Medicare Other | Admitting: *Deleted

## 2013-04-04 VITALS — BP 140/80

## 2013-04-04 DIAGNOSIS — Z23 Encounter for immunization: Secondary | ICD-10-CM | POA: Diagnosis not present

## 2013-04-04 NOTE — Telephone Encounter (Signed)
Patient's cuff is accurate and apparently he truly hs whit coat HTN.  No change in meds at this time.

## 2013-04-04 NOTE — Telephone Encounter (Signed)
Pt wanted to let you know that his BP was 130 70 our cuff and 140 80 with his cuff.

## 2013-04-05 NOTE — Telephone Encounter (Signed)
Patient aware.

## 2013-04-20 ENCOUNTER — Telehealth: Payer: Self-pay | Admitting: Family Medicine

## 2013-04-20 MED ORDER — ALPRAZOLAM 0.5 MG PO TABS: 0.5000 mg | ORAL_TABLET | Freq: Every evening | ORAL | Status: DC | PRN

## 2013-04-20 NOTE — Telephone Encounter (Signed)
Rx Refilled & Patient aware

## 2013-04-20 NOTE — Telephone Encounter (Signed)
?   OK to Refill  

## 2013-04-20 NOTE — Telephone Encounter (Signed)
ok 

## 2013-04-20 NOTE — Telephone Encounter (Signed)
Pt is needing a refill on ALPRAZolam (XANAX) 0.5 MG tablet Call [phone] (call when ready)

## 2013-05-03 ENCOUNTER — Other Ambulatory Visit: Payer: Self-pay | Admitting: Family Medicine

## 2013-06-04 ENCOUNTER — Other Ambulatory Visit: Payer: Self-pay | Admitting: Family Medicine

## 2013-06-06 ENCOUNTER — Ambulatory Visit (INDEPENDENT_AMBULATORY_CARE_PROVIDER_SITE_OTHER): Payer: Medicare Other | Admitting: Family Medicine

## 2013-06-06 ENCOUNTER — Encounter: Payer: Self-pay | Admitting: Family Medicine

## 2013-06-06 VITALS — BP 130/68 | HR 78 | Temp 96.9°F | Resp 18 | Ht 66.0 in | Wt 148.0 lb

## 2013-06-06 DIAGNOSIS — J309 Allergic rhinitis, unspecified: Secondary | ICD-10-CM

## 2013-06-06 DIAGNOSIS — J209 Acute bronchitis, unspecified: Secondary | ICD-10-CM

## 2013-06-06 MED ORDER — AZITHROMYCIN 250 MG PO TABS: ORAL_TABLET | ORAL | Status: DC

## 2013-06-06 MED ORDER — FLUTICASONE PROPIONATE 50 MCG/ACT NA SUSP: 2.0000 | Freq: Every day | NASAL | Status: DC

## 2013-06-06 NOTE — Progress Notes (Signed)
Subjective:    Patient ID: Martin [name], male    DOB: [birth year], 80 y.o.   MRN: [id number]  HPI  Patient has a history of COPD. Last week he developed a cough productive of green sputum and worsening shortness of breath. He denies any fever. He denies any chest pain. Shortness of breath is gradually improving however he continues to have a productive cough. He also has rhinorrhea due to allergies. Given his history of myocardial infarction he was concerned about the medicine to take to treat his rhinorrhea. Past Medical History  Diagnosis Date  . Hypertension   . Hyperlipidemia   . Myocardial infarction     1978 & 2011  . COPD (chronic obstructive pulmonary disease)   . Peripheral vascular disease, unspecified   . GERD (gastroesophageal reflux disease)   . Arthritis   . H/O: GI bleed   . Hiatal hernia   . Diabetes mellitus   . Bronchitis   . Renal artery stenosis    Current Outpatient Prescriptions on File Prior to Visit  Medication Sig Dispense Refill  . ALPRAZolam (XANAX) 0.5 MG tablet Take 1 tablet (0.5 mg total) by mouth at bedtime as needed for sleep.  30 tablet  2  . aspirin 81 MG tablet Take 81 mg by mouth daily.        Marland Kitchen diltiazem (CARDIZEM CD) 120 MG 24 hr capsule TAKE ONE CAPSULE BY MOUTH DAILY.  30 capsule  5  . famotidine (PEPCID) 20 MG tablet TAKE ONE TABLET BY MOUTH TWICE DAILY  60 tablet  11  . losartan (COZAAR) 50 MG tablet TAKE ONE TABLET BY MOUTH ONCE DAILY.      Marland Kitchen losartan (COZAAR) 50 MG tablet Take 1 tablet (50 mg total) by mouth daily.  30 tablet  11  . metoprolol succinate (TOPROL-XL) 25 MG 24 hr tablet TAKE ONE TABLET BY MOUTH ONCE DAILY. NEEDS OFFICE VISIT AND BLOOD WORK  30 tablet  11  . nitroGLYCERIN (NITROSTAT) 0.4 MG SL tablet Place 1 tablet (0.4 mg total) under the tongue as directed.  25 tablet  1  . pravastatin (PRAVACHOL) 80 MG tablet Take 1 tablet (80 mg total) by mouth daily.  90 tablet  3  . SYMBICORT 160-4.5 MCG/ACT inhaler INHALE ONE PUFF BY  MOUTH TWICE DAILY  10.2 g  11   No current facility-administered medications on file prior to visit.   Allergies  Allergen Reactions  . Penicillins    History   Social History  . Marital Status: Widowed    Spouse Name: N/A    Number of Children: N/A  . Years of Education: N/A   Occupational History  . Not on file.   Social History Main Topics  . Smoking status: Former Smoker -- 46 years    Types: Cigarettes    Quit date: 07/08/2009  . Smokeless tobacco: Never Used  . Alcohol Use: No  . Drug Use: No  . Sexual Activity: Not on file   Other Topics Concern  . Not on file   Social History Narrative  . No narrative on file     Review of Systems  All other systems reviewed and are negative.       Objective:   Physical Exam  Vitals reviewed. HENT:  Right Ear: External ear normal.  Left Ear: External ear normal.  Nose: Rhinorrhea and nose lacerations present.  Mouth/Throat: Oropharynx is clear and moist. No oropharyngeal exudate.  Neck: Neck supple.  Cardiovascular: Normal rate, regular  rhythm and normal heart sounds.   Pulmonary/Chest: Effort normal. He has decreased breath sounds. He has no wheezes. He has rales.  Lymphadenopathy:    He has no cervical adenopathy.          Assessment & Plan:  Acute bronchitis - Plan: azithromycin (ZITHROMAX) 250 MG tablet  Allergic rhinitis - Plan: fluticasone (FLONASE) 50 MCG/ACT nasal spray  I believe the patient has developed bronchitis. Begin Zithromax 500 mg by mouth daily 1 and 250 mg by mouth daily 2 through 5. Recheck in 1 week if no better or sooner if worse. They did not appreciate any wheezing today on examination and therefore I do not feel he needs prednisone. Due to his allergies I recommend Flonase 2 sprays each nostril daily.

## 2013-06-28 DIAGNOSIS — H02109 Unspecified ectropion of unspecified eye, unspecified eyelid: Secondary | ICD-10-CM | POA: Diagnosis not present

## 2013-06-28 DIAGNOSIS — H04529 Eversion of unspecified lacrimal punctum: Secondary | ICD-10-CM | POA: Diagnosis not present

## 2013-08-12 ENCOUNTER — Telehealth: Payer: Self-pay | Admitting: *Deleted

## 2013-08-12 NOTE — Telephone Encounter (Signed)
Needs refill on ALPRAZolam Martin [name]) 0.5 MG tablet  Pharmacy [facility]

## 2013-08-12 NOTE — Telephone Encounter (Signed)
?   OK to Refill  

## 2013-08-12 NOTE — Telephone Encounter (Signed)
ok 

## 2013-08-15 MED ORDER — ALPRAZOLAM 0.5 MG PO TABS: 0.5000 mg | ORAL_TABLET | Freq: Every evening | ORAL | Status: DC | PRN

## 2013-08-15 NOTE — Telephone Encounter (Signed)
Rx Refilled  

## 2013-08-19 ENCOUNTER — Telehealth: Payer: Self-pay | Admitting: Family Medicine

## 2013-08-19 NOTE — Telephone Encounter (Signed)
error 

## 2013-09-12 ENCOUNTER — Other Ambulatory Visit: Payer: Self-pay | Admitting: Family Medicine

## 2013-09-27 DIAGNOSIS — H02109 Unspecified ectropion of unspecified eye, unspecified eyelid: Secondary | ICD-10-CM | POA: Diagnosis not present

## 2013-10-03 ENCOUNTER — Ambulatory Visit: Payer: Medicare Other | Admitting: Family Medicine

## 2013-10-03 ENCOUNTER — Ambulatory Visit (INDEPENDENT_AMBULATORY_CARE_PROVIDER_SITE_OTHER): Payer: Medicare Other | Admitting: Family Medicine

## 2013-10-03 ENCOUNTER — Encounter: Payer: Self-pay | Admitting: Family Medicine

## 2013-10-03 VITALS — BP 160/70 | HR 64 | Temp 97.6°F | Resp 16 | Ht 66.0 in | Wt 144.0 lb

## 2013-10-03 DIAGNOSIS — I10 Essential (primary) hypertension: Secondary | ICD-10-CM | POA: Diagnosis not present

## 2013-10-03 DIAGNOSIS — E785 Hyperlipidemia, unspecified: Secondary | ICD-10-CM

## 2013-10-03 DIAGNOSIS — E119 Type 2 diabetes mellitus without complications: Secondary | ICD-10-CM

## 2013-10-03 DIAGNOSIS — D485 Neoplasm of uncertain behavior of skin: Secondary | ICD-10-CM | POA: Diagnosis not present

## 2013-10-03 NOTE — Progress Notes (Signed)
Subjective:    Patient ID: Martin [name], male    DOB: September 22, 1930, 80 y.o.   MRN: [id number]  HPI Today for followup of his multiple medical problems. History of peripheral vascular disease. He also has a history of type 2 diabetes mellitus. He denies any polyuria polydipsia or blurred vision. He denies any worsening of his claudication. He is able to walk and play golf with minimal discomfort.  He denies myalgias or right upper quadrant pain on a statin. He is overdue for a fasting lipid panel as well as a hemoglobin A1c. His blood pressure today in office is 160/70. However the patient checks his blood pressure daily basis at home. His average blood pressures 125-140 over 70s. He denies any chest pain shortness of breath or dyspnea on exertion. He does have 3 concerning lesions on his face. He has a 3 mm erythematous papule on his left upper lip.  He has a 4 mm erythematous papule just under his left nostril.  He has any 3 mm ulcerative lesion on his right eyelid. Past Medical History  Diagnosis Date  . Hypertension   . Hyperlipidemia   . Myocardial infarction     1978 & 2011  . COPD (chronic obstructive pulmonary disease)   . Peripheral vascular disease, unspecified   . GERD (gastroesophageal reflux disease)   . Arthritis   . H/O: GI bleed   . Hiatal hernia   . Diabetes mellitus   . Bronchitis   . Renal artery stenosis    Current Outpatient Prescriptions on File Prior to Visit  Medication Sig Dispense Refill  . ALPRAZolam (XANAX) 0.5 MG tablet Take 1 tablet (0.5 mg total) by mouth at bedtime as needed for sleep.  30 tablet  2  . aspirin 81 MG tablet Take 81 mg by mouth daily.        Marland Kitchen diltiazem (CARDIZEM CD) 120 MG 24 hr capsule TAKE ONE CAPSULE BY MOUTH ONCE DAILY  30 capsule  3  . famotidine (PEPCID) 20 MG tablet TAKE ONE TABLET BY MOUTH TWICE DAILY  60 tablet  11  . fluticasone (FLONASE) 50 MCG/ACT nasal spray Place 2 sprays into both nostrils daily.  16 g  6  . losartan  (COZAAR) 50 MG tablet Take 1 tablet (50 mg total) by mouth daily.  30 tablet  11  . metoprolol succinate (TOPROL-XL) 25 MG 24 hr tablet TAKE ONE TABLET BY MOUTH ONCE DAILY. NEEDS OFFICE VISIT AND BLOOD WORK  30 tablet  11  . nitroGLYCERIN (NITROSTAT) 0.4 MG SL tablet Place 1 tablet (0.4 mg total) under the tongue as directed.  25 tablet  1  . pravastatin (PRAVACHOL) 80 MG tablet Take 1 tablet (80 mg total) by mouth daily.  90 tablet  3  . SYMBICORT 160-4.5 MCG/ACT inhaler INHALE ONE PUFF BY MOUTH TWICE DAILY  10.2 g  11   No current facility-administered medications on file prior to visit.   Allergies  Allergen Reactions  . Penicillins    History   Social History  . Marital Status: Widowed    Spouse Name: N/A    Number of Children: N/A  . Years of Education: N/A   Occupational History  . Not on file.   Social History Main Topics  . Smoking status: Former Smoker -- 16 years    Types: Cigarettes    Quit date: 07/08/2009  . Smokeless tobacco: Never Used  . Alcohol Use: No  . Drug Use: No  . Sexual Activity:  Not on file   Other Topics Concern  . Not on file   Social History Narrative  . No narrative on file      Review of Systems  All other systems reviewed and are negative.      Objective:   Physical Exam  Vitals reviewed. Constitutional: He appears well-developed and well-nourished.  Neck: Neck supple. No thyromegaly present.  Cardiovascular: Normal rate, regular rhythm and normal heart sounds.   Pulmonary/Chest: Effort normal and breath sounds normal. No respiratory distress. He has no wheezes. He has no rales.  Abdominal: Soft. Bowel sounds are normal. He exhibits no distension. There is no tenderness. There is no rebound and no guarding.  Musculoskeletal: He exhibits no edema.          Assessment & Plan:  1. Neoplasm of uncertain behavior of skin I applied cryotherapy using liquid nitrogen for a total of 30 seconds each to the lesion below his left  nostril and on his left upper lip. I recommended he see a dermatologist regarding the lesion on his right eyelid.  2. Type II or unspecified type diabetes mellitus without mention of complication, not stated as uncontrolled Return fasting for hemoglobin A1c. - COMPLETE METABOLIC PANEL WITH GFR; Future - Hemoglobin A1c; Future  3. Essential hypertension Home blood pressures are well controlled. I'll make no changes in his medication at this time. - COMPLETE METABOLIC PANEL WITH GFR; Future  4. HLD (hyperlipidemia) Return fasting for a fasting lipid panel. Goal LDL is less than 70. - COMPLETE METABOLIC PANEL WITH GFR; Future - Lipid panel; Future

## 2013-10-04 ENCOUNTER — Other Ambulatory Visit: Payer: Medicare Other

## 2013-10-04 DIAGNOSIS — E785 Hyperlipidemia, unspecified: Secondary | ICD-10-CM

## 2013-10-04 DIAGNOSIS — I10 Essential (primary) hypertension: Secondary | ICD-10-CM

## 2013-10-04 DIAGNOSIS — E119 Type 2 diabetes mellitus without complications: Secondary | ICD-10-CM

## 2013-10-04 LAB — COMPLETE METABOLIC PANEL WITH GFR
ALBUMIN: 4.3 g/dL (ref 3.5–5.2)
ALT: 16 U/L (ref 0–53)
AST: 20 U/L (ref 0–37)
Alkaline Phosphatase: 74 U/L (ref 39–117)
BUN: 32 mg/dL — AB (ref 6–23)
CALCIUM: 9.4 mg/dL (ref 8.4–10.5)
CHLORIDE: 105 meq/L (ref 96–112)
CO2: 25 mEq/L (ref 19–32)
Creat: 1.54 mg/dL — ABNORMAL HIGH (ref 0.50–1.35)
GFR, Est African American: 48 mL/min — ABNORMAL LOW
GFR, Est Non African American: 42 mL/min — ABNORMAL LOW
Glucose, Bld: 112 mg/dL — ABNORMAL HIGH (ref 70–99)
POTASSIUM: 4.5 meq/L (ref 3.5–5.3)
SODIUM: 141 meq/L (ref 135–145)
TOTAL PROTEIN: 7 g/dL (ref 6.0–8.3)
Total Bilirubin: 0.6 mg/dL (ref 0.2–1.2)

## 2013-10-04 LAB — LIPID PANEL
CHOL/HDL RATIO: 3.3 ratio
CHOLESTEROL: 155 mg/dL (ref 0–200)
HDL: 47 mg/dL (ref >39)
LDL Cholesterol: 73 mg/dL (ref 0–99)
Triglycerides: 177 mg/dL — ABNORMAL HIGH (ref <150)
VLDL: 35 mg/dL (ref 0–40)

## 2013-10-04 LAB — HEMOGLOBIN A1C
HEMOGLOBIN A1C: 6.1 % — AB (ref <5.7)
MEAN PLASMA GLUCOSE: 128 mg/dL — AB (ref <117)

## 2013-10-05 ENCOUNTER — Other Ambulatory Visit: Payer: Self-pay | Admitting: Family Medicine

## 2013-10-05 DIAGNOSIS — Z85828 Personal history of other malignant neoplasm of skin: Secondary | ICD-10-CM

## 2013-10-06 ENCOUNTER — Telehealth: Payer: Self-pay | Admitting: Family Medicine

## 2013-10-06 ENCOUNTER — Encounter: Payer: Self-pay | Admitting: Family Medicine

## 2013-10-06 NOTE — Telephone Encounter (Signed)
Called [facility] to get PA for Symbicort and they stated that he has to try and fail 2 formulary medications before they will cover Symbicort and Advair was one of them - OK to switch pt to Advair???

## 2013-10-07 NOTE — Telephone Encounter (Signed)
Switch to advair 250/50 1 inh bid.

## 2013-10-10 MED ORDER — FLUTICASONE-SALMETEROL 250-50 MCG/DOSE IN AEPB: 1.0000 | INHALATION_SPRAY | Freq: Two times a day (BID) | RESPIRATORY_TRACT | Status: DC

## 2013-10-10 NOTE — Telephone Encounter (Signed)
Pt aware and med sent to pharm 

## 2013-10-14 DIAGNOSIS — L570 Actinic keratosis: Secondary | ICD-10-CM | POA: Diagnosis not present

## 2013-10-14 DIAGNOSIS — D485 Neoplasm of uncertain behavior of skin: Secondary | ICD-10-CM | POA: Diagnosis not present

## 2013-10-14 DIAGNOSIS — C44111 Basal cell carcinoma of skin of unspecified eyelid, including canthus: Secondary | ICD-10-CM | POA: Diagnosis not present

## 2013-11-22 DIAGNOSIS — C44111 Basal cell carcinoma of skin of unspecified eyelid, including canthus: Secondary | ICD-10-CM | POA: Diagnosis not present

## 2013-12-01 ENCOUNTER — Other Ambulatory Visit: Payer: Self-pay | Admitting: Family Medicine

## 2013-12-01 ENCOUNTER — Other Ambulatory Visit: Payer: Self-pay | Admitting: *Deleted

## 2013-12-01 MED ORDER — BUDESONIDE-FORMOTEROL FUMARATE 160-4.5 MCG/ACT IN AERO: INHALATION_SPRAY | RESPIRATORY_TRACT | Status: DC

## 2013-12-01 NOTE — Telephone Encounter (Signed)
Refill appropriate and filled per protocol. 

## 2013-12-07 ENCOUNTER — Other Ambulatory Visit: Payer: Self-pay | Admitting: *Deleted

## 2013-12-07 NOTE — Telephone Encounter (Signed)
Received fax requesting refill on Symbicort.   Refill appropriate and filled per protocol.

## 2013-12-08 ENCOUNTER — Telehealth: Payer: Self-pay | Admitting: Family Medicine

## 2013-12-08 ENCOUNTER — Ambulatory Visit (INDEPENDENT_AMBULATORY_CARE_PROVIDER_SITE_OTHER): Payer: Medicare Other | Admitting: Family Medicine

## 2013-12-08 DIAGNOSIS — Z23 Encounter for immunization: Secondary | ICD-10-CM | POA: Diagnosis not present

## 2013-12-08 MED ORDER — ALPRAZOLAM 0.5 MG PO TABS: 0.5000 mg | ORAL_TABLET | Freq: Every evening | ORAL | Status: DC | PRN

## 2013-12-08 NOTE — Telephone Encounter (Signed)
ok 

## 2013-12-08 NOTE — Telephone Encounter (Signed)
rx called in

## 2013-12-08 NOTE — Telephone Encounter (Signed)
Pt requesting refill Xanax 0.5mg  prn sleep.  Last RF 6/8 #30 + 2.  OK refill??   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

## 2013-12-16 ENCOUNTER — Other Ambulatory Visit: Payer: Self-pay | Admitting: Family Medicine

## 2013-12-16 MED ORDER — BUDESONIDE-FORMOTEROL FUMARATE 160-4.5 MCG/ACT IN AERO: INHALATION_SPRAY | RESPIRATORY_TRACT | Status: DC

## 2013-12-28 ENCOUNTER — Encounter: Payer: Self-pay | Admitting: Vascular Surgery

## 2013-12-29 ENCOUNTER — Ambulatory Visit (INDEPENDENT_AMBULATORY_CARE_PROVIDER_SITE_OTHER)
Admission: RE | Admit: 2013-12-29 | Discharge: 2013-12-29 | Disposition: A | Discharge status: Home / Self Care | Payer: Medicare Other | Source: Ambulatory Visit | Attending: Vascular Surgery | Admitting: Vascular Surgery

## 2013-12-29 ENCOUNTER — Ambulatory Visit (HOSPITAL_COMMUNITY)
Admission: RE | Admit: 2013-12-29 | Discharge: 2013-12-29 | Disposition: A | Discharge status: Home / Self Care | Payer: Medicare Other | Source: Ambulatory Visit | Attending: Vascular Surgery | Admitting: Vascular Surgery

## 2013-12-29 ENCOUNTER — Encounter: Payer: Self-pay | Admitting: Vascular Surgery

## 2013-12-29 ENCOUNTER — Other Ambulatory Visit: Payer: Self-pay | Admitting: Vascular Surgery

## 2013-12-29 ENCOUNTER — Ambulatory Visit (INDEPENDENT_AMBULATORY_CARE_PROVIDER_SITE_OTHER): Payer: Medicare Other | Admitting: Vascular Surgery

## 2013-12-29 VITALS — BP 158/59 | HR 58 | Ht 66.0 in | Wt 145.4 lb

## 2013-12-29 DIAGNOSIS — Z48812 Encounter for surgical aftercare following surgery on the circulatory system: Secondary | ICD-10-CM | POA: Insufficient documentation

## 2013-12-29 DIAGNOSIS — R0989 Other specified symptoms and signs involving the circulatory and respiratory systems: Secondary | ICD-10-CM

## 2013-12-29 DIAGNOSIS — I70219 Atherosclerosis of native arteries of extremities with intermittent claudication, unspecified extremity: Secondary | ICD-10-CM

## 2013-12-29 DIAGNOSIS — I739 Peripheral vascular disease, unspecified: Secondary | ICD-10-CM

## 2013-12-29 DIAGNOSIS — I6529 Occlusion and stenosis of unspecified carotid artery: Secondary | ICD-10-CM

## 2013-12-29 NOTE — Progress Notes (Signed)
Patient is a 80 year old male who returns for followup today. He previously underwent aortobifemoral bypass graft and right femoral popliteal bypass grafting in May of 2012. His right femoropopliteal has been chronically occluded. He has chronic intermittent stable claudication in the right leg. He denies rest pain. He denies nonhealing wounds on the foot. He overall is fairly active and plays golf 3 times a week. He states that currently he can walk 50 yards before experiencing right leg symptoms. This is unchanged from one year ago. Chronic medical problems include hypertension hyperlipidemia coronary artery disease COPD all of which are currently stable.      Current Outpatient Prescriptions on File Prior to Visit  Medication Sig Dispense Refill  . ALPRAZolam (XANAX) 0.5 MG tablet Take 1 tablet (0.5 mg total) by mouth at bedtime as needed for sleep.  30 tablet  2  . aspirin 81 MG tablet Take 81 mg by mouth daily.        . budesonide-formoterol (SYMBICORT) 160-4.5 MCG/ACT inhaler INHALE ONE PUFF BY MOUTH TWICE DAILY  11 g  6  . diltiazem (CARDIZEM CD) 120 MG 24 hr capsule TAKE ONE CAPSULE BY MOUTH ONCE DAILY  30 capsule  3  . famotidine (PEPCID) 20 MG tablet TAKE ONE TABLET BY MOUTH TWICE DAILY  60 tablet  11  . fluticasone (FLONASE) 50 MCG/ACT nasal spray Place 2 sprays into both nostrils daily.  16 g  6  . Fluticasone-Salmeterol (ADVAIR DISKUS) 250-50 MCG/DOSE AEPB Inhale 1 puff into the lungs 2 (two) times daily.  1 each  11  . losartan (COZAAR) 50 MG tablet Take 1 tablet (50 mg total) by mouth daily.  30 tablet  11  . metoprolol succinate (TOPROL-XL) 25 MG 24 hr tablet TAKE ONE TABLET BY MOUTH ONCE DAILY. NEEDS OFFICE VISIT AND BLOOD WORK  30 tablet  11  . nitroGLYCERIN (NITROSTAT) 0.4 MG SL tablet Place 1 tablet (0.4 mg total) under the tongue as directed.  25 tablet  1  . pravastatin (PRAVACHOL) 80 MG tablet Take 1 tablet (80 mg total) by mouth daily.  90 tablet  3   No current  facility-administered medications on file prior to visit.      Allergies   Allergen  Reactions   .  Penicillins        Past Surgical History   Procedure  Laterality  Date   .  Aortobifemoral bpg    07/22/10       and Right femoral-popliteal BPG      Past Medical History   Diagnosis  Date   .  Hypertension     .  Hyperlipidemia     .  Myocardial infarction         1978 & 2011   .  COPD (chronic obstructive pulmonary disease)     .  Peripheral vascular disease, unspecified     .  GERD (gastroesophageal reflux disease)     .  Arthritis     .  H/O: GI bleed     .  Hiatal hernia     .  Diabetes mellitus     .  Bronchitis     .  Renal artery stenosis       Review of systems: He has shortness of breath with severe exertion. He denies chest pain.  Physical exam:    Filed Vitals:   12/29/13 1143  BP: 158/59  Pulse: 58  Height: 5\' 6"  (1.676 m)  Weight: 145 lb 6.4 oz (65.953  kg)  SpO2: 100%   Neck: Left-sided carotid bruit 2+ carotid pulses bilaterally Abdomen: Soft nontender nondistended no pulsatile mass well-healed laparotomy incision 2+ femoral pulses bilaterally  Extremities: Absent popliteal and pedal pulses bilaterally feet pink with 3 second capillary refill and symmetric  Data: 0.45 on the right 0.72  This is essentially unchanged over the last 2 years.  Carotid duplex scan was performed today based on a left-sided carotid bruit. This showed bilateral 40-60% stenosis unchanged from 2012.  Assessment: Right lower extremity claudication with patent aortobifemoral bypass graft. The patient currently does not feel he is limited by his claudication symptoms and prefers conservative management at this point. He is on aspirin and a cholesterol-lowering agent.  Plan: Followup ABIs and office visit in 12 months.  We'll also repeat his carotid duplex at that time.  Ruta Hinds, MD [facility] Office: [phone] Pager: [phone]

## 2014-01-06 DIAGNOSIS — L570 Actinic keratosis: Secondary | ICD-10-CM | POA: Diagnosis not present

## 2014-01-13 ENCOUNTER — Other Ambulatory Visit: Payer: Self-pay | Admitting: Family Medicine

## 2014-01-13 DIAGNOSIS — L570 Actinic keratosis: Secondary | ICD-10-CM | POA: Diagnosis not present

## 2014-01-13 MED ORDER — DILTIAZEM HCL ER COATED BEADS 120 MG PO CP24: ORAL_CAPSULE | ORAL | Status: DC

## 2014-01-13 NOTE — Telephone Encounter (Signed)
Med sent to pharm 

## 2014-01-16 ENCOUNTER — Other Ambulatory Visit: Payer: Self-pay | Admitting: Family Medicine

## 2014-02-17 DIAGNOSIS — L570 Actinic keratosis: Secondary | ICD-10-CM | POA: Diagnosis not present

## 2014-03-18 ENCOUNTER — Other Ambulatory Visit: Payer: Self-pay | Admitting: Family Medicine

## 2014-03-21 ENCOUNTER — Other Ambulatory Visit: Payer: Self-pay | Admitting: Family Medicine

## 2014-03-21 NOTE — Telephone Encounter (Signed)
LRF 10/1 #30 + 2  LOV 10/03/13  OK refill?

## 2014-03-21 NOTE — Telephone Encounter (Signed)
ok 

## 2014-03-21 NOTE — Telephone Encounter (Signed)
RX called in .

## 2014-04-13 ENCOUNTER — Telehealth: Payer: Self-pay | Admitting: *Deleted

## 2014-04-13 MED ORDER — PRAVASTATIN SODIUM 80 MG PO TABS: 80.0000 mg | ORAL_TABLET | Freq: Every day | ORAL | Status: DC

## 2014-04-13 NOTE — Telephone Encounter (Signed)
Pt called stating needing a refill on pravastatin needs 90 day supply, pt has upcoming appt on Feb 9 for 6 mons followup, script filled for 90 days.

## 2014-04-18 ENCOUNTER — Ambulatory Visit (INDEPENDENT_AMBULATORY_CARE_PROVIDER_SITE_OTHER): Payer: Medicare Other | Admitting: Family Medicine

## 2014-04-18 ENCOUNTER — Encounter: Payer: Self-pay | Admitting: Family Medicine

## 2014-04-18 VITALS — BP 162/74 | HR 76 | Temp 98.6°F | Resp 18 | Ht 66.0 in | Wt 144.0 lb

## 2014-04-18 DIAGNOSIS — I2510 Atherosclerotic heart disease of native coronary artery without angina pectoris: Secondary | ICD-10-CM | POA: Diagnosis not present

## 2014-04-18 DIAGNOSIS — I10 Essential (primary) hypertension: Secondary | ICD-10-CM | POA: Diagnosis not present

## 2014-04-18 DIAGNOSIS — R7309 Other abnormal glucose: Secondary | ICD-10-CM | POA: Diagnosis not present

## 2014-04-18 DIAGNOSIS — R7303 Prediabetes: Secondary | ICD-10-CM

## 2014-04-18 DIAGNOSIS — E785 Hyperlipidemia, unspecified: Secondary | ICD-10-CM

## 2014-04-18 NOTE — Progress Notes (Signed)
Subjective:    Patient ID: Martin [name], male    DOB: 1931-03-05, 81 y.o.   MRN: [id number]  HPI Patient is a very pleasant 81 year old white male with a history of ASCVD, myocardial infarction, and peripheral vascular disease. He is followed by Dr. Oneida Alar.  Patient had carotid Dopplers performed in October which showed stable 40-59% bilateral internal carotid artery stenosis. He is compliant in taking Aspirin. He has stable claudication symptoms. He is discussing with his CVT S surgeon possibly having stents put in his right leg. We're trying to manage his medical risk factors as well as we can. His risk factors include hypertension, hyperlipidemia, and prediabetes. He is due for hemoglobin A1c. His last fasting blood sugar was 112. He is also due for fasting lipid panel. His goal LDL cholesterol is less than 70. He denies any myalgias or right upper quadrant pain on pravastatin 80 mg a day. His blood pressure today is extremely high. The patient has a long-standing history of white coat syndrome. He checks his blood pressure everyday at home and his blood pressure ranges 120-130/70-80. Past Medical History  Diagnosis Date  . Hypertension   . Hyperlipidemia   . Myocardial infarction     1978 & 2011  . COPD (chronic obstructive pulmonary disease)   . Peripheral vascular disease, unspecified   . GERD (gastroesophageal reflux disease)   . Arthritis   . H/O: GI bleed   . Hiatal hernia   . Diabetes mellitus   . Bronchitis   . Renal artery stenosis    Past Surgical History  Procedure Laterality Date  . Aortobifemoral bpg  07/22/10    and Right femoral-popliteal BPG   Current Outpatient Prescriptions on File Prior to Visit  Medication Sig Dispense Refill  . ALPRAZolam (XANAX) 0.5 MG tablet TAKE ONE TABLET BY MOUTH AT BEDTIME AS NEEDED 30 tablet 2  . aspirin 81 MG tablet Take 81 mg by mouth daily.      Marland Kitchen diltiazem (CARDIZEM CD) 120 MG 24 hr capsule TAKE ONE CAPSULE BY MOUTH ONCE DAILY 30  capsule 3  . losartan (COZAAR) 50 MG tablet Take 1 tablet (50 mg total) by mouth daily. 30 tablet 11  . metoprolol succinate (TOPROL-XL) 25 MG 24 hr tablet TAKE ONE TABLET BY MOUTH ONCE DAILY 30 tablet 5  . nitroGLYCERIN (NITROSTAT) 0.4 MG SL tablet Place 1 tablet (0.4 mg total) under the tongue as directed. 25 tablet 1  . pravastatin (PRAVACHOL) 80 MG tablet Take 1 tablet (80 mg total) by mouth daily. 90 tablet 3  . budesonide-formoterol (SYMBICORT) 160-4.5 MCG/ACT inhaler INHALE ONE PUFF BY MOUTH TWICE DAILY (Patient not taking: Reported on 04/18/2014) 11 g 6   No current facility-administered medications on file prior to visit.   Allergies  Allergen Reactions  . Penicillins    History   Social History  . Marital Status: Widowed    Spouse Name: N/A    Number of Children: N/A  . Years of Education: N/A   Occupational History  . Not on file.   Social History Main Topics  . Smoking status: Former Smoker -- 14 years    Types: Cigarettes    Quit date: 07/08/2009  . Smokeless tobacco: Never Used  . Alcohol Use: No  . Drug Use: No  . Sexual Activity: Not on file   Other Topics Concern  . Not on file   Social History Narrative     Review of Systems  All other systems reviewed and  are negative.      Objective:   Physical Exam  Constitutional: He appears well-developed and well-nourished.  Neck: Neck supple. No JVD present. No thyromegaly present.  Cardiovascular: Normal rate, regular rhythm and normal heart sounds.   No murmur heard. Pulmonary/Chest: Effort normal. He has decreased breath sounds. He has no wheezes. He has no rhonchi. He has rales.  Abdominal: Soft. Bowel sounds are normal.  Musculoskeletal: He exhibits no edema.  Lymphadenopathy:    He has no cervical adenopathy.  Vitals reviewed.         Assessment & Plan:  ASCVD (arteriosclerotic cardiovascular disease) - Plan: COMPLETE METABOLIC PANEL WITH GFR, CBC with Differential/Platelet, Lipid  panel  Prediabetes - Plan: Hemoglobin A1c  hyperlipidemia Hypertension   Patient's blood pressure here is elevated. However his blood pressure at home is adequately controlled. He does have white coat syndrome and therefore I will not make changes in his blood pressure medication at this time. I will check a fasting lipid panel. Goal LDL cholesterol is less than 70. Patient is compliant with his aspirin. I will also check a hemoglobin A1c as well as a fasting blood sugar. Goal hemoglobin A1c is less than 6.5

## 2014-04-19 LAB — COMPLETE METABOLIC PANEL WITH GFR
ALBUMIN: 4.3 g/dL (ref 3.5–5.2)
ALT: 13 U/L (ref 0–53)
AST: 19 U/L (ref 0–37)
Alkaline Phosphatase: 67 U/L (ref 39–117)
BUN: 27 mg/dL — ABNORMAL HIGH (ref 6–23)
CALCIUM: 9.3 mg/dL (ref 8.4–10.5)
CHLORIDE: 102 meq/L (ref 96–112)
CO2: 32 mEq/L (ref 19–32)
Creat: 1.43 mg/dL — ABNORMAL HIGH (ref 0.50–1.35)
GFR, Est African American: 53 mL/min — ABNORMAL LOW
GFR, Est Non African American: 46 mL/min — ABNORMAL LOW
Glucose, Bld: 110 mg/dL — ABNORMAL HIGH (ref 70–99)
POTASSIUM: 4.4 meq/L (ref 3.5–5.3)
Sodium: 139 mEq/L (ref 135–145)
TOTAL PROTEIN: 7.1 g/dL (ref 6.0–8.3)
Total Bilirubin: 0.7 mg/dL (ref 0.2–1.2)

## 2014-04-19 LAB — CBC WITH DIFFERENTIAL/PLATELET
Basophils Absolute: 0 10*3/uL (ref 0.0–0.1)
Basophils Relative: 0 % (ref 0–1)
EOS ABS: 0.1 10*3/uL (ref 0.0–0.7)
EOS PCT: 1 % (ref 0–5)
HEMATOCRIT: 45.2 % (ref 39.0–52.0)
Hemoglobin: 15.7 g/dL (ref 13.0–17.0)
LYMPHS ABS: 1.6 10*3/uL (ref 0.7–4.0)
Lymphocytes Relative: 23 % (ref 12–46)
MCH: 32.9 pg (ref 26.0–34.0)
MCHC: 34.7 g/dL (ref 30.0–36.0)
MCV: 94.8 fL (ref 78.0–100.0)
MONOS PCT: 4 % (ref 3–12)
MPV: 10.9 fL (ref 8.6–12.4)
Monocytes Absolute: 0.3 10*3/uL (ref 0.1–1.0)
Neutro Abs: 5 10*3/uL (ref 1.7–7.7)
Neutrophils Relative %: 72 % (ref 43–77)
Platelets: 182 10*3/uL (ref 150–400)
RBC: 4.77 MIL/uL (ref 4.22–5.81)
RDW: 13.1 % (ref 11.5–15.5)
WBC: 7 10*3/uL (ref 4.0–10.5)

## 2014-04-19 LAB — LIPID PANEL
Cholesterol: 170 mg/dL (ref 0–200)
HDL: 47 mg/dL (ref >39)
LDL CALC: 82 mg/dL (ref 0–99)
Total CHOL/HDL Ratio: 3.6 Ratio
Triglycerides: 206 mg/dL — ABNORMAL HIGH (ref <150)
VLDL: 41 mg/dL — ABNORMAL HIGH (ref 0–40)

## 2014-04-19 LAB — HEMOGLOBIN A1C
Hgb A1c MFr Bld: 6 % — ABNORMAL HIGH (ref <5.7)
MEAN PLASMA GLUCOSE: 126 mg/dL — AB (ref <117)

## 2014-05-09 DIAGNOSIS — H04521 Eversion of right lacrimal punctum: Secondary | ICD-10-CM | POA: Diagnosis not present

## 2014-05-09 DIAGNOSIS — H02132 Senile ectropion of right lower eyelid: Secondary | ICD-10-CM | POA: Diagnosis not present

## 2014-05-12 ENCOUNTER — Other Ambulatory Visit: Payer: Self-pay | Admitting: Family Medicine

## 2014-05-12 NOTE — Telephone Encounter (Signed)
Medication refilled per protocol. 

## 2014-05-16 DIAGNOSIS — H02132 Senile ectropion of right lower eyelid: Secondary | ICD-10-CM | POA: Diagnosis not present

## 2014-05-16 DIAGNOSIS — H04521 Eversion of right lacrimal punctum: Secondary | ICD-10-CM | POA: Diagnosis not present

## 2014-05-29 ENCOUNTER — Other Ambulatory Visit: Payer: Self-pay | Admitting: Family Medicine

## 2014-06-16 DIAGNOSIS — L570 Actinic keratosis: Secondary | ICD-10-CM | POA: Diagnosis not present

## 2014-07-04 ENCOUNTER — Other Ambulatory Visit: Payer: Self-pay | Admitting: Family Medicine

## 2014-07-04 NOTE — Telephone Encounter (Signed)
rx called in

## 2014-07-04 NOTE — Telephone Encounter (Signed)
Ok to refill??  Last office visit 04/18/2014.  Last refill 03/21/2014, #2 refills.

## 2014-07-04 NOTE — Telephone Encounter (Signed)
ok 

## 2014-07-07 ENCOUNTER — Telehealth: Payer: Self-pay | Admitting: Family Medicine

## 2014-07-07 NOTE — Telephone Encounter (Signed)
Spoke to pt's daughter and appt made to discuss in ov

## 2014-07-07 NOTE — Telephone Encounter (Signed)
Please call patients's daughter regarding patient's state of mind

## 2014-07-14 ENCOUNTER — Other Ambulatory Visit: Payer: Self-pay | Admitting: Family Medicine

## 2014-08-04 ENCOUNTER — Encounter: Payer: Self-pay | Admitting: Family Medicine

## 2014-08-09 ENCOUNTER — Other Ambulatory Visit: Payer: Self-pay | Admitting: Family Medicine

## 2014-08-09 NOTE — Telephone Encounter (Signed)
?   OK to Refill  

## 2014-08-10 ENCOUNTER — Other Ambulatory Visit: Payer: Self-pay | Admitting: Family Medicine

## 2014-08-10 NOTE — Telephone Encounter (Signed)
Yes and he has an appt schedule for 08/11/14

## 2014-08-10 NOTE — Telephone Encounter (Signed)
Ok but NTBS before anymore (as you know about the unusual situation).

## 2014-08-10 NOTE — Telephone Encounter (Signed)
ok 

## 2014-08-11 ENCOUNTER — Encounter: Payer: Self-pay | Admitting: Family Medicine

## 2014-08-11 ENCOUNTER — Other Ambulatory Visit: Payer: Self-pay | Admitting: Family Medicine

## 2014-08-11 ENCOUNTER — Ambulatory Visit (INDEPENDENT_AMBULATORY_CARE_PROVIDER_SITE_OTHER): Payer: Medicare Other | Admitting: Family Medicine

## 2014-08-11 VITALS — BP 130/70 | HR 68 | Temp 97.7°F | Resp 18 | Wt 144.0 lb

## 2014-08-11 DIAGNOSIS — R413 Other amnesia: Secondary | ICD-10-CM | POA: Diagnosis not present

## 2014-08-11 DIAGNOSIS — R7303 Prediabetes: Secondary | ICD-10-CM

## 2014-08-11 DIAGNOSIS — R7309 Other abnormal glucose: Secondary | ICD-10-CM

## 2014-08-11 DIAGNOSIS — I2510 Atherosclerotic heart disease of native coronary artery without angina pectoris: Secondary | ICD-10-CM | POA: Diagnosis not present

## 2014-08-11 DIAGNOSIS — R945 Abnormal results of liver function studies: Secondary | ICD-10-CM | POA: Diagnosis not present

## 2014-08-11 LAB — LIPID PANEL
CHOLESTEROL: 131 mg/dL (ref 0–200)
HDL: 45 mg/dL (ref >=40)
LDL Cholesterol: 57 mg/dL (ref 0–99)
Total CHOL/HDL Ratio: 2.9 Ratio
Triglycerides: 143 mg/dL (ref <150)
VLDL: 29 mg/dL (ref 0–40)

## 2014-08-11 LAB — COMPLETE METABOLIC PANEL WITH GFR
ALBUMIN: 4.1 g/dL (ref 3.5–5.2)
ALT: 236 U/L — AB (ref 0–53)
AST: 128 U/L — ABNORMAL HIGH (ref 0–37)
Alkaline Phosphatase: 72 U/L (ref 39–117)
BUN: 27 mg/dL — AB (ref 6–23)
CO2: 25 meq/L (ref 19–32)
Calcium: 9.1 mg/dL (ref 8.4–10.5)
Chloride: 107 mEq/L (ref 96–112)
Creat: 1.27 mg/dL (ref 0.50–1.35)
GFR, EST AFRICAN AMERICAN: 61 mL/min
GFR, Est Non African American: 53 mL/min — ABNORMAL LOW
Glucose, Bld: 108 mg/dL — ABNORMAL HIGH (ref 70–99)
POTASSIUM: 4.4 meq/L (ref 3.5–5.3)
SODIUM: 139 meq/L (ref 135–145)
Total Bilirubin: 1.1 mg/dL (ref 0.2–1.2)
Total Protein: 6.7 g/dL (ref 6.0–8.3)

## 2014-08-11 LAB — CBC WITH DIFFERENTIAL/PLATELET
Basophils Absolute: 0.1 10*3/uL (ref 0.0–0.1)
Basophils Relative: 1 % (ref 0–1)
EOS ABS: 0.8 10*3/uL — AB (ref 0.0–0.7)
Eosinophils Relative: 12 % — ABNORMAL HIGH (ref 0–5)
HCT: 42.7 % (ref 39.0–52.0)
Hemoglobin: 14.6 g/dL (ref 13.0–17.0)
LYMPHS ABS: 1.7 10*3/uL (ref 0.7–4.0)
Lymphocytes Relative: 24 % (ref 12–46)
MCH: 31.9 pg (ref 26.0–34.0)
MCHC: 34.2 g/dL (ref 30.0–36.0)
MCV: 93.2 fL (ref 78.0–100.0)
MPV: 10.9 fL (ref 8.6–12.4)
Monocytes Absolute: 0.6 10*3/uL (ref 0.1–1.0)
Monocytes Relative: 8 % (ref 3–12)
Neutro Abs: 3.9 10*3/uL (ref 1.7–7.7)
Neutrophils Relative %: 55 % (ref 43–77)
Platelets: 170 10*3/uL (ref 150–400)
RBC: 4.58 MIL/uL (ref 4.22–5.81)
RDW: 13.5 % (ref 11.5–15.5)
WBC: 7 10*3/uL (ref 4.0–10.5)

## 2014-08-11 LAB — VITAMIN B12: VITAMIN B 12: 1462 pg/mL — AB (ref 211–911)

## 2014-08-11 LAB — HEMOGLOBIN A1C
Hgb A1c MFr Bld: 6.2 % — ABNORMAL HIGH (ref <5.7)
MEAN PLASMA GLUCOSE: 131 mg/dL — AB (ref <117)

## 2014-08-11 NOTE — Progress Notes (Signed)
Subjective:    Patient ID: Martin [name], male    DOB: 1930-06-07, 81 y.o.   MRN: [id number]  HPI  I asked the patient to come in for medicine check. Patient's granddaughter, Martin [name], is also my patient. Please see her last office visit. She had accused the patient of stalking her and throwing males in her driveway and also showing unwanted amorous behavior towards her.  I therefore brought the patient in for a mental evaluation to see if there was any signs of dementia, delirium, or underlying mental illness. However, Martin [name], did not want the patient to know that I was aware of this. Therefore I performed a Mini-Mental status exam today. Patient scored 30 out of 30 on Mini-Mental status exam. I did perform some higher cognitive testing. I asked the patient to draw a clock says that it shows 4:30. The patient was unable to numbers correctly. He put before in the 3 spot. He then wrote the #30 at the bottom of a clock where the six would normally go.  He left all other numbers off the clock. Therefore there may be some mild dementia that is difficult to ascertain. I then performed a depression screen. He denied any depression or anhedonia or suicidal thoughts or anxiety or insomnia or poor concentration or memory problems. I screened the patient for bipolar but he denies any symptoms of mania. He denies any auditory hallucinations visual hallucinations or delusions. The remainder of his neurologic exam is completely normal. Past Medical History  Diagnosis Date  . Hypertension   . Hyperlipidemia   . Myocardial infarction     1978 & 2011  . COPD (chronic obstructive pulmonary disease)   . Peripheral vascular disease, unspecified   . GERD (gastroesophageal reflux disease)   . Arthritis   . H/O: GI bleed   . Hiatal hernia   . Diabetes mellitus   . Bronchitis   . Renal artery stenosis    Past Surgical History  Procedure Laterality Date  . Aortobifemoral bpg  07/22/10    and Right  femoral-popliteal BPG   Current Outpatient Prescriptions on File Prior to Visit  Medication Sig Dispense Refill  . ALPRAZolam (XANAX) 0.5 MG tablet TAKE ONE TABLET BY MOUTH AT BEDTIME AS NEEDED 30 tablet 0  . aspirin 81 MG tablet Take 81 mg by mouth daily.      . budesonide-formoterol (SYMBICORT) 160-4.5 MCG/ACT inhaler INHALE ONE PUFF BY MOUTH TWICE DAILY (Patient not taking: Reported on 04/18/2014) 11 g 6  . CARTIA XT 120 MG 24 hr capsule TAKE ONE CAPSULE BY MOUTH ONCE DAILY 30 capsule 5  . famotidine (PEPCID) 20 MG tablet TAKE ONE TABLET BY MOUTH TWICE DAILY 60 tablet 5  . losartan (COZAAR) 50 MG tablet TAKE ONE TABLET BY MOUTH ONCE DAILY 30 tablet 11  . metoprolol succinate (TOPROL-XL) 25 MG 24 hr tablet TAKE ONE TABLET BY MOUTH ONCE DAILY 30 tablet 5  . nitroGLYCERIN (NITROSTAT) 0.4 MG SL tablet Place 1 tablet (0.4 mg total) under the tongue as directed. 25 tablet 1  . pravastatin (PRAVACHOL) 80 MG tablet Take 1 tablet (80 mg total) by mouth daily. 90 tablet 3   No current facility-administered medications on file prior to visit.   Allergies  Allergen Reactions  . Penicillins    History   Social History  . Marital Status: Widowed    Spouse Name: N/A  . Number of Children: N/A  . Years of Education: N/A   Occupational History  .  Not on file.   Social History Main Topics  . Smoking status: Former Smoker -- 96 years    Types: Cigarettes    Quit date: 07/08/2009  . Smokeless tobacco: Never Used  . Alcohol Use: No  . Drug Use: No  . Sexual Activity: Not on file   Other Topics Concern  . Not on file   Social History Narrative      Review of Systems  All other systems reviewed and are negative.      Objective:   Physical Exam  Constitutional: He is oriented to person, place, and time. He appears well-developed and well-nourished.  Cardiovascular: Normal rate, regular rhythm, normal heart sounds and intact distal pulses.   No murmur heard. Pulmonary/Chest: Effort  normal and breath sounds normal. No respiratory distress. He has no wheezes. He has no rales.  Abdominal: Soft. Bowel sounds are normal. He exhibits no distension and no mass. There is no tenderness. There is no rebound and no guarding.  Neurological: He is alert and oriented to person, place, and time. He has normal reflexes. He displays normal reflexes. No cranial nerve deficit. He exhibits normal muscle tone. Coordination normal.  Psychiatric: He has a normal mood and affect. His behavior is normal. Judgment and thought content normal.  Vitals reviewed.         Assessment & Plan:  ASCVD (arteriosclerotic cardiovascular disease) - Plan: COMPLETE METABOLIC PANEL WITH GFR, CBC with Differential/Platelet, Hemoglobin A1c, Lipid panel  Memory loss - Plan: Vitamin B12  Prediabetes - Plan: Hemoglobin A1c  While there may be some mild memory impairment that is very subtle. There is no obvious dementia or depression or psychiatric condition that would explain the patient's behavior. Therefore I will recommend that Martin [name], who is on the patient's HIPA form, handle this situation legally.

## 2014-08-15 LAB — HEPATITIS PANEL, ACUTE
HCV Ab: NEGATIVE
Hep A IgM: NONREACTIVE
Hep B C IgM: NONREACTIVE
Hepatitis B Surface Ag: NEGATIVE

## 2014-08-18 ENCOUNTER — Telehealth: Payer: Self-pay | Admitting: Family Medicine

## 2014-08-18 ENCOUNTER — Other Ambulatory Visit: Payer: Self-pay | Admitting: Family Medicine

## 2014-08-18 DIAGNOSIS — R7989 Other specified abnormal findings of blood chemistry: Secondary | ICD-10-CM

## 2014-08-18 DIAGNOSIS — R945 Abnormal results of liver function studies: Principal | ICD-10-CM

## 2014-08-18 NOTE — Telephone Encounter (Signed)
Patient returning call for test results [phone]

## 2014-08-18 NOTE — Telephone Encounter (Signed)
Patient aware of results and [place] ordered

## 2014-08-21 DIAGNOSIS — K824 Cholesterolosis of gallbladder: Secondary | ICD-10-CM | POA: Diagnosis not present

## 2014-08-24 ENCOUNTER — Telehealth: Payer: Self-pay | Admitting: Family Medicine

## 2014-08-24 DIAGNOSIS — R945 Abnormal results of liver function studies: Principal | ICD-10-CM

## 2014-08-24 DIAGNOSIS — R7989 Other specified abnormal findings of blood chemistry: Secondary | ICD-10-CM

## 2014-08-24 NOTE — Telephone Encounter (Signed)
Patient aware of results and will come in for BW in 2 weeks

## 2014-08-24 NOTE — Telephone Encounter (Signed)
-----   Message from Susy Frizzle, MD sent at 08/24/2014  7:29 AM EDT ----- Liver US is nml. I suspect elevated LFT's are due to pravastatin.  Stop pravastatin and recheck LFT's in 2 weeks.

## 2014-09-07 ENCOUNTER — Other Ambulatory Visit: Payer: Self-pay | Admitting: Family Medicine

## 2014-09-07 NOTE — Telephone Encounter (Signed)
Medication refilled per protocol. 

## 2014-09-07 NOTE — Telephone Encounter (Signed)
LRF 08/10/14 #30.  LOV 08/11/14  OK refill?

## 2014-09-07 NOTE — Telephone Encounter (Signed)
Approved. # 30 + 0. 

## 2014-09-08 ENCOUNTER — Other Ambulatory Visit: Payer: Self-pay | Admitting: Family Medicine

## 2014-09-08 ENCOUNTER — Other Ambulatory Visit: Payer: Medicare Other

## 2014-09-08 DIAGNOSIS — R7989 Other specified abnormal findings of blood chemistry: Secondary | ICD-10-CM | POA: Diagnosis not present

## 2014-09-08 DIAGNOSIS — R945 Abnormal results of liver function studies: Principal | ICD-10-CM

## 2014-09-08 DIAGNOSIS — R799 Abnormal finding of blood chemistry, unspecified: Secondary | ICD-10-CM | POA: Diagnosis not present

## 2014-09-09 LAB — BASIC METABOLIC PANEL
BUN: 37 mg/dL — AB (ref 6–23)
CALCIUM: 9.2 mg/dL (ref 8.4–10.5)
CO2: 25 mEq/L (ref 19–32)
Chloride: 101 mEq/L (ref 96–112)
Creat: 1.46 mg/dL — ABNORMAL HIGH (ref 0.50–1.35)
GLUCOSE: 118 mg/dL — AB (ref 70–99)
POTASSIUM: 4.2 meq/L (ref 3.5–5.3)
SODIUM: 142 meq/L (ref 135–145)

## 2014-09-12 LAB — HEPATIC FUNCTION PANEL
ALBUMIN: 4 g/dL (ref 3.5–5.2)
ALK PHOS: 79 U/L (ref 39–117)
ALT: 28 U/L (ref 0–53)
AST: 26 U/L (ref 0–37)
Bilirubin, Direct: 0.1 mg/dL (ref 0.0–0.3)
Indirect Bilirubin: 0.4 mg/dL (ref 0.2–1.2)
Total Bilirubin: 0.5 mg/dL (ref 0.2–1.2)
Total Protein: 6.5 g/dL (ref 6.0–8.3)

## 2014-09-18 ENCOUNTER — Encounter: Payer: Self-pay | Admitting: Family Medicine

## 2014-10-18 ENCOUNTER — Other Ambulatory Visit: Payer: Self-pay | Admitting: Physician Assistant

## 2014-10-18 NOTE — Telephone Encounter (Signed)
ok 

## 2014-10-18 NOTE — Telephone Encounter (Signed)
Medication called to pharmacy. 

## 2014-10-18 NOTE — Telephone Encounter (Signed)
Ok to refill??  Last office visit 08/11/2014.  Last refill 09/07/2014.

## 2014-11-15 ENCOUNTER — Other Ambulatory Visit: Payer: Self-pay | Admitting: Family Medicine

## 2014-11-15 NOTE — Telephone Encounter (Signed)
Medication refilled per protocol. 

## 2014-11-17 DIAGNOSIS — Z23 Encounter for immunization: Secondary | ICD-10-CM | POA: Diagnosis not present

## 2014-11-21 ENCOUNTER — Other Ambulatory Visit: Payer: Self-pay | Admitting: Family Medicine

## 2014-11-21 NOTE — Telephone Encounter (Signed)
ok 

## 2014-11-21 NOTE — Telephone Encounter (Signed)
?   OK to Refill  

## 2015-01-02 ENCOUNTER — Encounter: Payer: Self-pay | Admitting: Vascular Surgery

## 2015-01-03 ENCOUNTER — Other Ambulatory Visit: Payer: Self-pay | Admitting: *Deleted

## 2015-01-03 DIAGNOSIS — I6523 Occlusion and stenosis of bilateral carotid arteries: Secondary | ICD-10-CM

## 2015-01-03 DIAGNOSIS — Z48812 Encounter for surgical aftercare following surgery on the circulatory system: Secondary | ICD-10-CM

## 2015-01-03 DIAGNOSIS — I739 Peripheral vascular disease, unspecified: Secondary | ICD-10-CM

## 2015-01-04 ENCOUNTER — Ambulatory Visit (INDEPENDENT_AMBULATORY_CARE_PROVIDER_SITE_OTHER): Payer: Medicare Other | Admitting: Vascular Surgery

## 2015-01-04 ENCOUNTER — Encounter: Payer: Self-pay | Admitting: Vascular Surgery

## 2015-01-04 ENCOUNTER — Ambulatory Visit (HOSPITAL_COMMUNITY)
Admission: RE | Admit: 2015-01-04 | Discharge: 2015-01-04 | Disposition: A | Discharge status: Home / Self Care | Payer: Medicare Other | Source: Ambulatory Visit | Attending: Vascular Surgery | Admitting: Vascular Surgery

## 2015-01-04 ENCOUNTER — Ambulatory Visit (INDEPENDENT_AMBULATORY_CARE_PROVIDER_SITE_OTHER)
Admission: RE | Admit: 2015-01-04 | Discharge: 2015-01-04 | Disposition: A | Discharge status: Home / Self Care | Payer: Medicare Other | Source: Ambulatory Visit | Attending: Vascular Surgery | Admitting: Vascular Surgery

## 2015-01-04 VITALS — BP 131/69 | HR 65 | Resp 14 | Ht 66.0 in | Wt 143.0 lb

## 2015-01-04 DIAGNOSIS — I6523 Occlusion and stenosis of bilateral carotid arteries: Secondary | ICD-10-CM

## 2015-01-04 DIAGNOSIS — I739 Peripheral vascular disease, unspecified: Secondary | ICD-10-CM | POA: Insufficient documentation

## 2015-01-04 DIAGNOSIS — I2510 Atherosclerotic heart disease of native coronary artery without angina pectoris: Secondary | ICD-10-CM

## 2015-01-04 DIAGNOSIS — Z48812 Encounter for surgical aftercare following surgery on the circulatory system: Secondary | ICD-10-CM | POA: Diagnosis not present

## 2015-01-04 NOTE — Progress Notes (Signed)
Patient is a 81 year old male who returns for followup today. He previously underwent aortobifemoral bypass graft and right femoral popliteal bypass grafting in May of 2012. His right femoropopliteal has been chronically occluded. He has chronic intermittent stable claudication in the right leg. He denies rest pain. He denies nonhealing wounds on the foot. He overall is fairly active and plays golf 3 times a week. He states that currently he can walk 50 yards before experiencing right leg symptoms. This is unchanged from one year ago. Chronic medical problems include hypertension hyperlipidemia coronary artery disease COPD all of which are currently stable.        Current Outpatient Prescriptions on File Prior to Visit  Medication Sig Dispense Refill  . ALPRAZolam (XANAX) 0.5 MG tablet TAKE ONE TABLET BY MOUTH AT BEDTIME AS NEEDED FOR ANXIETY OR  INSOMNIA 30 tablet 2  . aspirin 81 MG tablet Take 81 mg by mouth daily.      . budesonide-formoterol (SYMBICORT) 160-4.5 MCG/ACT inhaler INHALE ONE PUFF BY MOUTH TWICE DAILY (Patient not taking: Reported on 01/04/2015) 11 g 6  . CARTIA XT 120 MG 24 hr capsule TAKE ONE CAPSULE BY MOUTH ONCE DAILY 30 capsule 2  . famotidine (PEPCID) 20 MG tablet TAKE ONE TABLET BY MOUTH TWICE DAILY 60 tablet 5  . losartan (COZAAR) 50 MG tablet TAKE ONE TABLET BY MOUTH ONCE DAILY 30 tablet 11  . metoprolol succinate (TOPROL-XL) 25 MG 24 hr tablet TAKE ONE TABLET BY MOUTH ONCE DAILY. 30 tablet 11  . nitroGLYCERIN (NITROSTAT) 0.4 MG SL tablet Place 1 tablet (0.4 mg total) under the tongue as directed. 25 tablet 1  . pravastatin (PRAVACHOL) 80 MG tablet Take 1 tablet (80 mg total) by mouth daily. (Patient not taking: Reported on 01/04/2015) 90 tablet 3   No current facility-administered medications on file prior to visit.      Allergies    Allergen   Reactions    .   Penicillins          Past Surgical History    Procedure   Laterality   Date    .   Aortobifemoral bpg       07/22/10          and Right femoral-popliteal BPG       Past Medical History    Diagnosis   Date    .   Hypertension       .   Hyperlipidemia       .   Myocardial infarction             1978 & 2011    .   COPD (chronic obstructive pulmonary disease)       .   Peripheral vascular disease, unspecified       .   GERD (gastroesophageal reflux disease)       .   Arthritis       .   H/O: GI bleed       .   Hiatal hernia       .   Diabetes mellitus       .   Bronchitis       .   Renal artery stenosis         Review of systems: He has shortness of breath with severe exertion. He denies chest pain.  Physical exam:    Filed Vitals:   01/04/15 1049 01/04/15 1053 01/04/15 1059  BP: 146/50 133/57 131/69  Pulse: 50 55 65  Resp:  14   Height:  '5\' 6"'$  (1.676 m)   Weight:  143 lb (64.864 kg)   SpO2:  99%    Neck: Left-sided carotid bruit 2+ carotid pulses bilaterally Abdomen: Soft nontender nondistended no pulsatile mass well-healed laparotomy incision 2+ femoral pulses bilaterally  Extremities: Absent popliteal and pedal pulses bilaterally feet pink with 3 second capillary refill and symmetric  Data: 0.45 on the right 0.72 on the left  This is essentially unchanged over the last 3 years.  Carotid duplex scan was performed today based on a left-sided carotid bruit. This showed bilateral 40-60% stenosis unchanged from 2012.  Assessment: Right lower extremity claudication with patent aortobifemoral bypass graft. The patient currently does not feel he is limited by his claudication symptoms and prefers conservative management at this point. He is on aspirin and a cholesterol-lowering agent.  Plan: Followup ABIs and office visit in 12 months.  We'll also repeat his carotid duplex at that time.  Ruta Hinds, MD [facility] Office: [phone] Pager: [phone]

## 2015-01-04 NOTE — Addendum Note (Signed)
Addended by: Dorthula Rue L on: 01/04/2015 02:06 PM   Modules accepted: Orders

## 2015-01-04 NOTE — Progress Notes (Signed)
Filed Vitals:   01/04/15 1049 01/04/15 1053 01/04/15 1059  BP: 146/50 133/57 131/69  Pulse: 50 55 65  Resp:  14   Height:  '5\' 6"'$  (1.676 m)   Weight:  143 lb (64.864 kg)   SpO2:  99%

## 2015-02-13 ENCOUNTER — Other Ambulatory Visit: Payer: Self-pay | Admitting: Family Medicine

## 2015-02-13 NOTE — Telephone Encounter (Signed)
Refill appropriate and filled per protocol. 

## 2015-03-06 ENCOUNTER — Other Ambulatory Visit: Payer: Self-pay | Admitting: Family Medicine

## 2015-03-06 NOTE — Telephone Encounter (Signed)
Medication called to pharmacy. 

## 2015-03-06 NOTE — Telephone Encounter (Signed)
Ok to refill??  Last office visit 08/11/2014.  Last refill 11/21/2014, #2 refills.

## 2015-03-06 NOTE — Telephone Encounter (Signed)
ok 

## 2015-05-15 ENCOUNTER — Encounter: Payer: Self-pay | Admitting: *Deleted

## 2015-05-16 DIAGNOSIS — R200 Anesthesia of skin: Secondary | ICD-10-CM | POA: Diagnosis not present

## 2015-05-30 DIAGNOSIS — G5601 Carpal tunnel syndrome, right upper limb: Secondary | ICD-10-CM | POA: Diagnosis not present

## 2015-05-31 ENCOUNTER — Other Ambulatory Visit: Payer: Self-pay | Admitting: Family Medicine

## 2015-06-01 NOTE — Telephone Encounter (Signed)
Refill appropriate and filled per protocol. 

## 2015-06-06 DIAGNOSIS — G5601 Carpal tunnel syndrome, right upper limb: Secondary | ICD-10-CM | POA: Diagnosis not present

## 2015-06-08 DIAGNOSIS — G5601 Carpal tunnel syndrome, right upper limb: Secondary | ICD-10-CM | POA: Diagnosis not present

## 2015-06-11 ENCOUNTER — Other Ambulatory Visit: Payer: Self-pay | Admitting: Family Medicine

## 2015-06-11 NOTE — Telephone Encounter (Signed)
Ok to refill??  Last office visit 08/11/2014.  Last refill 03/06/2015, #2 refills.

## 2015-06-12 ENCOUNTER — Other Ambulatory Visit: Payer: Self-pay | Admitting: Family Medicine

## 2015-06-12 NOTE — Telephone Encounter (Signed)
Medication called to pharmacy. 

## 2015-06-12 NOTE — Telephone Encounter (Signed)
ok 

## 2015-06-13 NOTE — Telephone Encounter (Signed)
Refill appropriate and filled per protocol. 

## 2015-06-20 DIAGNOSIS — G5601 Carpal tunnel syndrome, right upper limb: Secondary | ICD-10-CM | POA: Diagnosis not present

## 2015-07-15 ENCOUNTER — Encounter (HOSPITAL_COMMUNITY): Payer: Self-pay

## 2015-07-15 ENCOUNTER — Emergency Department (HOSPITAL_COMMUNITY): Payer: Medicare Other

## 2015-07-15 ENCOUNTER — Inpatient Hospital Stay (HOSPITAL_COMMUNITY)
Admission: EM | Admit: 2015-07-15 | Discharge: 2015-07-17 | DRG: 064 | Disposition: A | Discharge status: Rehabilitation Facility | Payer: Medicare Other | Attending: Internal Medicine | Admitting: Internal Medicine

## 2015-07-15 DIAGNOSIS — E785 Hyperlipidemia, unspecified: Secondary | ICD-10-CM | POA: Diagnosis present

## 2015-07-15 DIAGNOSIS — I129 Hypertensive chronic kidney disease with stage 1 through stage 4 chronic kidney disease, or unspecified chronic kidney disease: Secondary | ICD-10-CM | POA: Diagnosis present

## 2015-07-15 DIAGNOSIS — R680 Hypothermia, not associated with low environmental temperature: Secondary | ICD-10-CM | POA: Diagnosis present

## 2015-07-15 DIAGNOSIS — I63211 Cerebral infarction due to unspecified occlusion or stenosis of right vertebral arteries: Secondary | ICD-10-CM | POA: Diagnosis not present

## 2015-07-15 DIAGNOSIS — K5901 Slow transit constipation: Secondary | ICD-10-CM | POA: Diagnosis not present

## 2015-07-15 DIAGNOSIS — J449 Chronic obstructive pulmonary disease, unspecified: Secondary | ICD-10-CM | POA: Diagnosis present

## 2015-07-15 DIAGNOSIS — I6389 Other cerebral infarction: Secondary | ICD-10-CM

## 2015-07-15 DIAGNOSIS — I639 Cerebral infarction, unspecified: Secondary | ICD-10-CM | POA: Diagnosis not present

## 2015-07-15 DIAGNOSIS — Z7982 Long term (current) use of aspirin: Secondary | ICD-10-CM | POA: Diagnosis not present

## 2015-07-15 DIAGNOSIS — F411 Generalized anxiety disorder: Secondary | ICD-10-CM | POA: Diagnosis not present

## 2015-07-15 DIAGNOSIS — E1122 Type 2 diabetes mellitus with diabetic chronic kidney disease: Secondary | ICD-10-CM | POA: Diagnosis present

## 2015-07-15 DIAGNOSIS — I10 Essential (primary) hypertension: Secondary | ICD-10-CM | POA: Diagnosis not present

## 2015-07-15 DIAGNOSIS — R278 Other lack of coordination: Secondary | ICD-10-CM | POA: Diagnosis present

## 2015-07-15 DIAGNOSIS — I2510 Atherosclerotic heart disease of native coronary artery without angina pectoris: Secondary | ICD-10-CM | POA: Insufficient documentation

## 2015-07-15 DIAGNOSIS — Z87891 Personal history of nicotine dependence: Secondary | ICD-10-CM

## 2015-07-15 DIAGNOSIS — H532 Diplopia: Secondary | ICD-10-CM | POA: Diagnosis present

## 2015-07-15 DIAGNOSIS — E1151 Type 2 diabetes mellitus with diabetic peripheral angiopathy without gangrene: Secondary | ICD-10-CM | POA: Diagnosis present

## 2015-07-15 DIAGNOSIS — I638 Other cerebral infarction: Secondary | ICD-10-CM | POA: Diagnosis not present

## 2015-07-15 DIAGNOSIS — N183 Chronic kidney disease, stage 3 unspecified: Secondary | ICD-10-CM | POA: Insufficient documentation

## 2015-07-15 DIAGNOSIS — R531 Weakness: Secondary | ICD-10-CM | POA: Diagnosis not present

## 2015-07-15 DIAGNOSIS — D72829 Elevated white blood cell count, unspecified: Secondary | ICD-10-CM | POA: Insufficient documentation

## 2015-07-15 DIAGNOSIS — I739 Peripheral vascular disease, unspecified: Secondary | ICD-10-CM | POA: Diagnosis not present

## 2015-07-15 DIAGNOSIS — K219 Gastro-esophageal reflux disease without esophagitis: Secondary | ICD-10-CM | POA: Diagnosis present

## 2015-07-15 DIAGNOSIS — D696 Thrombocytopenia, unspecified: Secondary | ICD-10-CM | POA: Insufficient documentation

## 2015-07-15 DIAGNOSIS — G936 Cerebral edema: Secondary | ICD-10-CM | POA: Diagnosis present

## 2015-07-15 DIAGNOSIS — E1159 Type 2 diabetes mellitus with other circulatory complications: Secondary | ICD-10-CM | POA: Diagnosis not present

## 2015-07-15 DIAGNOSIS — I252 Old myocardial infarction: Secondary | ICD-10-CM | POA: Diagnosis not present

## 2015-07-15 DIAGNOSIS — G4700 Insomnia, unspecified: Secondary | ICD-10-CM | POA: Diagnosis present

## 2015-07-15 DIAGNOSIS — I6621 Occlusion and stenosis of right posterior cerebral artery: Secondary | ICD-10-CM | POA: Diagnosis not present

## 2015-07-15 DIAGNOSIS — R404 Transient alteration of awareness: Secondary | ICD-10-CM | POA: Diagnosis not present

## 2015-07-15 DIAGNOSIS — Z951 Presence of aortocoronary bypass graft: Secondary | ICD-10-CM | POA: Diagnosis not present

## 2015-07-15 DIAGNOSIS — R110 Nausea: Secondary | ICD-10-CM | POA: Diagnosis not present

## 2015-07-15 DIAGNOSIS — E119 Type 2 diabetes mellitus without complications: Secondary | ICD-10-CM

## 2015-07-15 DIAGNOSIS — I6789 Other cerebrovascular disease: Secondary | ICD-10-CM | POA: Diagnosis not present

## 2015-07-15 DIAGNOSIS — T68XXXA Hypothermia, initial encounter: Secondary | ICD-10-CM | POA: Diagnosis present

## 2015-07-15 DIAGNOSIS — J441 Chronic obstructive pulmonary disease with (acute) exacerbation: Secondary | ICD-10-CM | POA: Insufficient documentation

## 2015-07-15 LAB — BASIC METABOLIC PANEL
ANION GAP: 11 (ref 5–15)
BUN: 33 mg/dL — ABNORMAL HIGH (ref 6–20)
CALCIUM: 9.4 mg/dL (ref 8.9–10.3)
CHLORIDE: 103 mmol/L (ref 101–111)
CO2: 22 mmol/L (ref 22–32)
CREATININE: 1.51 mg/dL — AB (ref 0.61–1.24)
GFR calc non Af Amer: 41 mL/min — ABNORMAL LOW (ref >60)
GFR, EST AFRICAN AMERICAN: 48 mL/min — AB (ref >60)
Glucose, Bld: 229 mg/dL — ABNORMAL HIGH (ref 65–99)
Potassium: 3.5 mmol/L (ref 3.5–5.1)
SODIUM: 136 mmol/L (ref 135–145)

## 2015-07-15 LAB — I-STAT CHEM 8, ED
BUN: 33 mg/dL — AB (ref 6–20)
CHLORIDE: 101 mmol/L (ref 101–111)
Calcium, Ion: 1.25 mmol/L (ref 1.13–1.30)
Creatinine, Ser: 1.4 mg/dL — ABNORMAL HIGH (ref 0.61–1.24)
Glucose, Bld: 217 mg/dL — ABNORMAL HIGH (ref 65–99)
HEMATOCRIT: 46 % (ref 39.0–52.0)
Hemoglobin: 15.6 g/dL (ref 13.0–17.0)
POTASSIUM: 3.5 mmol/L (ref 3.5–5.1)
SODIUM: 138 mmol/L (ref 135–145)
TCO2: 23 mmol/L (ref 0–100)

## 2015-07-15 LAB — DIFFERENTIAL
BASOS ABS: 0 10*3/uL (ref 0.0–0.1)
Basophils Relative: 0 %
EOS PCT: 0 %
Eosinophils Absolute: 0.1 10*3/uL (ref 0.0–0.7)
Lymphocytes Relative: 11 %
Lymphs Abs: 1.3 10*3/uL (ref 0.7–4.0)
MONO ABS: 0.5 10*3/uL (ref 0.1–1.0)
Monocytes Relative: 4 %
NEUTROS PCT: 85 %
Neutro Abs: 10.2 10*3/uL — ABNORMAL HIGH (ref 1.7–7.7)

## 2015-07-15 LAB — URINALYSIS, ROUTINE W REFLEX MICROSCOPIC
Bilirubin Urine: NEGATIVE
Glucose, UA: 100 mg/dL — AB
Hgb urine dipstick: NEGATIVE
Ketones, ur: NEGATIVE mg/dL
LEUKOCYTES UA: NEGATIVE
Nitrite: NEGATIVE
PROTEIN: 100 mg/dL — AB
SPECIFIC GRAVITY, URINE: 1.016 (ref 1.005–1.030)
pH: 7 (ref 5.0–8.0)

## 2015-07-15 LAB — RAPID URINE DRUG SCREEN, HOSP PERFORMED
AMPHETAMINES: NOT DETECTED
BARBITURATES: NOT DETECTED
BENZODIAZEPINES: POSITIVE — AB
COCAINE: NOT DETECTED
Opiates: NOT DETECTED
TETRAHYDROCANNABINOL: NOT DETECTED

## 2015-07-15 LAB — CBC
HCT: 42.2 % (ref 39.0–52.0)
HEMOGLOBIN: 13.6 g/dL (ref 13.0–17.0)
MCH: 31.7 pg (ref 26.0–34.0)
MCHC: 32.2 g/dL (ref 30.0–36.0)
MCV: 98.4 fL (ref 78.0–100.0)
PLATELETS: 133 10*3/uL — AB (ref 150–400)
RBC: 4.29 MIL/uL (ref 4.22–5.81)
RDW: 12.7 % (ref 11.5–15.5)
WBC: 12.4 10*3/uL — AB (ref 4.0–10.5)

## 2015-07-15 LAB — URINE MICROSCOPIC-ADD ON

## 2015-07-15 LAB — PROTIME-INR
INR: 1.09 (ref 0.00–1.49)
PROTHROMBIN TIME: 14.3 s (ref 11.6–15.2)

## 2015-07-15 LAB — APTT: APTT: 28 s (ref 24–37)

## 2015-07-15 LAB — ETHANOL

## 2015-07-15 LAB — I-STAT TROPONIN, ED: TROPONIN I, POC: 0.01 ng/mL (ref 0.00–0.08)

## 2015-07-15 MED ORDER — FAMOTIDINE 20 MG PO TABS
20.0000 mg | ORAL_TABLET | Freq: Two times a day (BID) | ORAL | Status: DC
  Administered 2015-07-16 – 2015-07-17 (×4): 20 mg via ORAL
  Filled 2015-07-15 (×4): qty 1

## 2015-07-15 MED ORDER — GADOBENATE DIMEGLUMINE 529 MG/ML IV SOLN
14.0000 mL | Freq: Once | INTRAVENOUS | Status: AC | PRN
  Administered 2015-07-15: 14 mL via INTRAVENOUS

## 2015-07-15 MED ORDER — ACETAMINOPHEN 325 MG PO TABS
650.0000 mg | ORAL_TABLET | ORAL | Status: DC | PRN
  Administered 2015-07-17: 650 mg via ORAL
  Filled 2015-07-15: qty 2

## 2015-07-15 MED ORDER — ASPIRIN 300 MG RE SUPP: 300.0000 mg | Freq: Every day | RECTAL | Status: DC

## 2015-07-15 MED ORDER — ALPRAZOLAM 0.5 MG PO TABS: 0.5000 mg | ORAL_TABLET | Freq: Every evening | ORAL | Status: DC | PRN

## 2015-07-15 MED ORDER — ONDANSETRON HCL 4 MG/2ML IJ SOLN
4.0000 mg | Freq: Once | INTRAMUSCULAR | Status: AC
  Administered 2015-07-15: 4 mg via INTRAVENOUS

## 2015-07-15 MED ORDER — ASPIRIN 81 MG PO TABS: 81.0000 mg | ORAL_TABLET | Freq: Every day | ORAL | Status: DC

## 2015-07-15 MED ORDER — ASPIRIN EC 325 MG PO TBEC
325.0000 mg | DELAYED_RELEASE_TABLET | Freq: Every day | ORAL | Status: DC
  Administered 2015-07-15 – 2015-07-17 (×3): 325 mg via ORAL
  Filled 2015-07-15 (×3): qty 1

## 2015-07-15 MED ORDER — STROKE: EARLY STAGES OF RECOVERY BOOK: Freq: Once | Status: DC

## 2015-07-15 MED ORDER — SENNOSIDES-DOCUSATE SODIUM 8.6-50 MG PO TABS: 1.0000 | ORAL_TABLET | Freq: Every evening | ORAL | Status: DC | PRN

## 2015-07-15 MED ORDER — ASPIRIN 325 MG PO TABS: 325.0000 mg | ORAL_TABLET | Freq: Every day | ORAL | Status: DC

## 2015-07-15 MED ORDER — ACETAMINOPHEN 650 MG RE SUPP: 650.0000 mg | RECTAL | Status: DC | PRN

## 2015-07-15 MED ORDER — SODIUM CHLORIDE 0.9 % IV SOLN
INTRAVENOUS | Status: DC
  Administered 2015-07-15: 21:00:00 via INTRAVENOUS
  Administered 2015-07-16: 1000 mL via INTRAVENOUS

## 2015-07-15 MED ORDER — ONDANSETRON HCL 4 MG/2ML IJ SOLN
INTRAMUSCULAR | Status: AC
  Filled 2015-07-15: qty 2

## 2015-07-15 NOTE — ED Provider Notes (Signed)
CSN: [id number]     Arrival date & time 07/15/15  0510 History   First MD Initiated Contact with Patient 07/15/15 0533     Chief Complaint  Patient presents with  . Weakness  . Dizziness      HPI Patient presents to the emergency department with complaints of acute onset no vision and ataxia today.  He had difficulty ambulating to the restroom and required a walker.  He stated he was seen 2.  No prior history of stroke.  He does have a history of coronary artery disease.  He denies weakness of his arms or legs.  When he went to bed last night at approximately 1 AM he felt fine and this event occurred approximately 2:30 AM.  He called his daughter and then called his neighbor.  His neighbor called EMS and brought him to the emergency department.     Past Medical History  Diagnosis Date  . Hypertension   . Hyperlipidemia   . Myocardial infarction [facility])     1978 & 2011  . COPD (chronic obstructive pulmonary disease) ([place])   . Peripheral vascular disease, unspecified ([place])   . GERD (gastroesophageal reflux disease)   . Arthritis   . H/O: GI bleed   . Hiatal hernia   . Diabetes mellitus   . Bronchitis   . Renal artery stenosis [facility])    Past Surgical History  Procedure Laterality Date  . Aortobifemoral bpg  07/22/10    and Right femoral-popliteal BPG   Family History  Problem Relation Age of Onset  . Cancer Father     LIVER CANCER   Social History  Substance Use Topics  . Smoking status: Former Smoker -- 40 years    Types: Cigarettes    Quit date: 07/08/2009  . Smokeless tobacco: Never Used  . Alcohol Use: No    Review of Systems  All other systems reviewed and are negative.     Allergies  Penicillins  Home Medications   Prior to Admission medications   Medication Sig Start Date End Date Taking? Authorizing Provider  ALPRAZolam Duanne Moron) 0.5 MG tablet TAKE ONE TABLET BY MOUTH AT BEDTIME AS NEEDED FOR ANXIETY OR INSOMNIA. 06/12/15   Susy Frizzle, MD  aspirin  81 MG tablet Take 81 mg by mouth daily.      Historical Provider, MD  budesonide-formoterol (SYMBICORT) 160-4.5 MCG/ACT inhaler INHALE ONE PUFF BY MOUTH TWICE DAILY Patient not taking: Reported on 01/04/2015 12/16/13   Susy Frizzle, MD  CARTIA XT 120 MG 24 hr capsule TAKE ONE CAPSULE BY MOUTH ONCE DAILY 06/13/15   Susy Frizzle, MD  famotidine (PEPCID) 20 MG tablet TAKE ONE TABLET BY MOUTH TWICE DAILY 07/14/14   Susy Frizzle, MD  losartan (COZAAR) 50 MG tablet TAKE ONE TABLET BY MOUTH ONCE DAILY 06/01/15   Susy Frizzle, MD  metoprolol succinate (TOPROL-XL) 25 MG 24 hr tablet TAKE ONE TABLET BY MOUTH ONCE DAILY. 08/18/14   Susy Frizzle, MD  nitroGLYCERIN (NITROSTAT) 0.4 MG SL tablet Place 1 tablet (0.4 mg total) under the tongue as directed. 10/13/12   Susy Frizzle, MD  pravastatin (PRAVACHOL) 80 MG tablet Take 1 tablet (80 mg total) by mouth daily. Patient not taking: Reported on 01/04/2015 04/13/14   Susy Frizzle, MD   BP 152/67 mmHg  Pulse 60  Resp 11  Ht '5\' 6"'$  (1.676 m)  Wt 146 lb (66.225 kg)  BMI 23.58 kg/m2  SpO2 96% Physical Exam  Constitutional: He is oriented to person, place, and time. He appears well-developed and well-nourished.  HENT:  Head: Normocephalic and atraumatic.  Eyes: EOM are normal. Pupils are equal, round, and reactive to light.  Neck: Normal range of motion.  Cardiovascular: Normal rate, regular rhythm, normal heart sounds and intact distal pulses.   Pulmonary/Chest: Effort normal and breath sounds normal. No respiratory distress.  Abdominal: Soft. He exhibits no distension. There is no tenderness.  Musculoskeletal: Normal range of motion.  Neurological: He is alert and oriented to person, place, and time.  5/5 strength in major muscle groups of  bilateral upper and lower extremities. Speech normal. No facial asymetry.   Skin: Skin is warm and dry.  Psychiatric: He has a normal mood and affect. Judgment normal.  Nursing note and vitals  reviewed.   ED Course  Procedures (including critical care time) Labs Review Labs Reviewed  BASIC METABOLIC PANEL - Abnormal; Notable for the following:    Glucose, Bld 229 (*)    BUN 33 (*)    Creatinine, Ser 1.51 (*)    GFR calc non Af Amer 41 (*)    GFR calc Af Amer 48 (*)    All other components within normal limits  CBC - Abnormal; Notable for the following:    WBC 12.4 (*)    Platelets 133 (*)    All other components within normal limits  DIFFERENTIAL - Abnormal; Notable for the following:    Neutro Abs 10.2 (*)    All other components within normal limits  I-STAT CHEM 8, ED - Abnormal; Notable for the following:    BUN 33 (*)    Creatinine, Ser 1.40 (*)    Glucose, Bld 217 (*)    All other components within normal limits  ETHANOL  PROTIME-INR  APTT  URINALYSIS, ROUTINE W REFLEX MICROSCOPIC (NOT AT [facility])  URINE RAPID DRUG SCREEN, HOSP PERFORMED  I-STAT TROPOININ, ED    BUN  Date Value Ref Range Status  07/15/2015 33* 6 - 20 mg/dL Final  07/15/2015 33* 6 - 20 mg/dL Final  09/08/2014 37* 6 - 23 mg/dL Final  08/11/2014 27* 6 - 23 mg/dL Final   CREAT  Date Value Ref Range Status  09/08/2014 1.46* 0.50 - 1.35 mg/dL Final  08/11/2014 1.27 0.50 - 1.35 mg/dL Final  04/18/2014 1.43* 0.50 - 1.35 mg/dL Final  10/04/2013 1.54* 0.50 - 1.35 mg/dL Final   CREATININE, SER  Date Value Ref Range Status  07/15/2015 1.40* 0.61 - 1.24 mg/dL Final  07/15/2015 1.51* 0.61 - 1.24 mg/dL Final  07/29/2010 1.29 0.4 - 1.5 mg/dL Final  07/28/2010 1.49 0.4 - 1.5 mg/dL Final       Imaging Review No results found. I have personally reviewed and evaluated these images and lab results as part of my medical decision-making.   EKG Interpretation   Date/Time:  Sunday Jul 15 2015 05:25:58 EDT Ventricular Rate:  58 PR Interval:    QRS Duration: 109 QT Interval:  470 QTC Calculation: 462 R Axis:   63 Text Interpretation:  normal sinus rhythm Incomplete left bundle branch  block  Anterior Q waves, possibly due to ILBBB No significant change since  Confirmed by Greycen Felter  MD, Paolina Karwowski ([id number]) on 07/15/2015 7:37:20 AM      MDM   Final diagnoses:  None    On examination I do not appreciate significant neurologic deficit.  Patient will undergo MRI at this time.  Renal insufficiency is consistent with his prior chronic renal insufficiency.  Care to Dr Christy Gentles to follow up on [place], MD 07/15/15 [phone]

## 2015-07-15 NOTE — Consult Note (Signed)
Neurology Consultation Reason for Consult: stroke Referring Physician: Janne Napoleon, D  CC: Dizziness  History is obtained from: Patient, family member  HPI: Martin [name] is a 82 y.o. male with history of hypertension, hyperlipidemia, myocardial infarction, diabetes who presents with sudden onset of this equilibrium and having sometime between 78 and 2:30 AMhe states that it was up at around 10 feeling relatively normal, and then when he awoke 2:30 AM he was unable to get out of bed.On arrival, his outside of any intervention or TPA window and therefore MRI was ordered which demonstrates a moderate-sized cerebellar infarct.     LKW: 10 PM tpa given?: no, Outside of window    ROS: A 14 point ROS was performed and is negative except as noted in the HPI.   Past Medical History  Diagnosis Date  . Hypertension   . Hyperlipidemia   . Myocardial infarction [facility])     1978 & 2011  . COPD (chronic obstructive pulmonary disease) ([place])   . Peripheral vascular disease, unspecified ([place])   . GERD (gastroesophageal reflux disease)   . Arthritis   . H/O: GI bleed   . Hiatal hernia   . Diabetes mellitus   . Bronchitis   . Renal artery stenosis ([facility])      Family History  Problem Relation Age of Onset  . Cancer Father     LIVER CANCER     Social History:  reports that he quit smoking about 6 years ago. His smoking use included Cigarettes. He quit after 65 years of use. He has never used smokeless tobacco. He reports that he does not drink alcohol or use illicit drugs.   Exam: Current vital signs: BP 157/76 mmHg  Pulse 79  Temp(Src) 96.7 F (35.9 C) (Rectal)  Resp 12  Ht '5\' 6"'$  (1.676 m)  Wt 66.225 kg (146 lb)  BMI 23.58 kg/m2  SpO2 92% Vital signs in last 24 hours: Temp:  [94.8 F (34.9 C)-96.7 F (35.9 C)] 96.7 F (35.9 C) (05/07 1108) Pulse Rate:  [54-79] 79 (05/07 0930) Resp:  [11-13] 12 (05/07 0930) BP: (152-162)/(58-76) 157/76 mmHg (05/07 0930) SpO2:  [92 %-98 %] 92  % (05/07 0930) Weight:  [66.225 kg (146 lb)] 66.225 kg (146 lb) (05/07 0525)   Physical Exam  Constitutional: Appears well-developed and well-nourished.  Psych: Affect appropriate to situation Eyes: No scleral injection HENT: No OP obstrucion Head: Normocephalic.  Cardiovascular: Normal rate and regular rhythm.  Respiratory: Effort normal and breath sounds normal to anterior ascultation GI: Soft.  No distension. There is no tenderness.  Skin: WDI  Neuro: Mental Status: Patient is awake, alert, oriented to person, place, month, year, and situation. No signs of aphasia or neglect Cranial Nerves: II: Visual Fields are full. L pupil slightly larger than right.  III,IV, VI: EOMI without ptosis. He does have diploplia in all directions of gaze, worse far than near.  V: Facial sensation is symmetric to temperature VII: Facial movement is symmetric.  VIII: hearing is intact to voice X: Uvula elevates symmetrically XI: Shoulder shrug is symmetric. XII: tongue is midline without atrophy or fasciculations.  Motor: Tone is normal. Bulk is normal. 5/5 strength was present in all four extremities.  Sensory: Sensation is symmetric to light touch and temperature in the arms and legs. Cerebellar: He has dysmetria on the right arm, possibly mild in the right leg as well.     I have reviewed labs in epic and the results pertinent to this consultation are: Bmp -  borderline creatinine  I have reviewed the images obtained:MRI brain - cerebellar infarct on the right.   Impression: 82 yo M with cerebellar infarct on the right. This could be large vessel atherosclerosis or embolism. I do not think that he will endager is fourth ventrical, but will get more frequent neuro checks and repeat imaging in the morning. I do not think that the hemorrhage on MRI, if there, is enough to warrant avoiding antiplatelet therapy.   Recommendations: 1. HgbA1c, fasting lipid panel 2. q2h  neuro checks 3.  Echocardiogram 4. Repeat head CT in the AM. 5. Prophylactic therapy-Antiplatelet med: Aspirin - dose '325mg'$  PO or '300mg'$  PR 6. Risk factor modification 7. Telemetry monitoring 8. PT consult, OT consult, Speech consult 9. please page stroke NP  Or  PA  Or MD  M-F from 8am -4 pm starting 5/8 as this patient will be followed by the stroke team at this point.   You can look them up on [email or web address]     Roland Rack, MD [facility] [phone]  If 7pm- 7am, please page neurology on call as listed in [place].

## 2015-07-15 NOTE — ED Notes (Signed)
Patient transported to MRI 

## 2015-07-15 NOTE — ED Notes (Signed)
Admitting MD at bedside.

## 2015-07-15 NOTE — ED Notes (Signed)
Pt taken off bear hugger per Admitting MD. Pt given warm blankets. Pt does not reports feeling "cold" anymore

## 2015-07-15 NOTE — ED Notes (Signed)
Per EMS pt went to be about 1 am and woke up around 2:30am and could not turn over with double vision; pt states he could not make his feet move; Pt from home alone but neighbor currently at bedside; Pt states pain in right jaw at 2/10 on arrival; Pt a&ox 4 on arrival.

## 2015-07-15 NOTE — ED Notes (Signed)
Pt reports continued dizziness that is worse with movement.

## 2015-07-15 NOTE — ED Notes (Signed)
Patient transported to [facility]

## 2015-07-15 NOTE — ED Provider Notes (Signed)
At signout, plan to f/u on mri If negative and he can walk he can go home Pt stable at this time   Ripley Fraise, MD 07/15/15 [phone]

## 2015-07-15 NOTE — ED Provider Notes (Signed)
Dr Leonel Ramsay added on MRA neck Recommends medical admit He needs q2 neuro checks for at least 48 hours   Ripley Fraise, MD 07/15/15 [phone]

## 2015-07-15 NOTE — ED Provider Notes (Signed)
Call from radiology concerning stroke They asked me to place order for MRA D/w dr Leonel Ramsay with neuro he will review imaging   Ripley Fraise, MD 07/15/15 [phone]

## 2015-07-15 NOTE — ED Provider Notes (Signed)
D/W PAULA FROM [facility] WILL ADMIT TO NEURO TELE FOR Q2 NEURO CHECKS FOR 48 HOURS ADMIT TO DR [facility] SERVICE PT UPDATED HE IS AWAKE/ALERT, NO DISTRESS   Ripley Fraise, MD 07/15/15 [phone]

## 2015-07-15 NOTE — ED Notes (Signed)
MD at bedside. 

## 2015-07-15 NOTE — H&P (Signed)
History and Physical    [name] DGU:[id number] DOB: [birth year] DOA: 07/15/2015  Referring MD/NP/PA: EDP - PCP: Odette Fraction, MD Outpatient Specialists: Ripley Fraise, MD Vascular - Ruta Hinds, MD Patient coming from:  [name]  Chief Complaint: double vision, gait disturbance  HPI: Martin [name] is a 82 y.o. male with medical history significant for but not necessarily limited to, coronary artery disease, remote MI, COPD, diabetes, and PVD. Around 10:00 last night patient noticed that both arms and both legs were cold but he did not think much of it. He also had some mild nausea. Patient went to bed around 1:00am but never actually fell asleep. Around 2 AM patient noticed that his vision was blurred and he was unable to roll over in bed. After about 30 minutes patient was able to get out of bed and walk to the bathroom to get his telephone. Patient called his neighbor and longtime friend Jenny Reichmann who went over and outpatient sitting on the toilet (but not using the toilet) Patient tried to walk but his gait was very unsteady. EMS was called and brought the patient in.   ED Course:  No medications or IV fluids given Rectal temp low at 94.8, Bair hugger. Vital signs otherwise stable  Review of Systems: As per HPI, otherwise 10 point review of systems negative.   Past Medical History  Diagnosis Date  . Hypertension   . Hyperlipidemia   . Myocardial infarction [facility])     1978 & 2011  . COPD (chronic obstructive pulmonary disease) ([place])   . Peripheral vascular disease, unspecified ([place])   . GERD (gastroesophageal reflux disease)   . Arthritis   . H/O: GI bleed   . Hiatal hernia   . Diabetes mellitus   . Bronchitis   . Renal artery stenosis [facility])     Past Surgical History  Procedure Laterality Date  . Aortobifemoral bpg  07/22/10    and Right femoral-popliteal BPG     reports that he quit smoking about 6 years ago. His smoking use included Cigarettes. He  quit after 65 years of use. He has never used smokeless tobacco. He reports that he does not drink alcohol or use illicit drugs.  Allergies  Allergen Reactions  . Penicillins Rash    Family History  Problem Relation Age of Onset  . Cancer Father     LIVER CANCER    Prior to Admission medications   Medication Sig Start Date End Date Taking? Authorizing Provider  ALPRAZolam Duanne Moron) 0.5 MG tablet TAKE ONE TABLET BY MOUTH AT BEDTIME AS NEEDED FOR ANXIETY OR INSOMNIA. 06/12/15  Yes Susy Frizzle, MD  aspirin 81 MG tablet Take 81 mg by mouth daily.     Yes Historical Provider, MD  CARTIA XT 120 MG 24 hr capsule TAKE ONE CAPSULE BY MOUTH ONCE DAILY 06/13/15  Yes Susy Frizzle, MD  famotidine (PEPCID) 20 MG tablet TAKE ONE TABLET BY MOUTH TWICE DAILY 07/14/14  Yes Susy Frizzle, MD  losartan (COZAAR) 50 MG tablet TAKE ONE TABLET BY MOUTH ONCE DAILY 06/01/15  Yes Susy Frizzle, MD  metoprolol succinate (TOPROL-XL) 25 MG 24 hr tablet TAKE ONE TABLET BY MOUTH ONCE DAILY. 08/18/14  Yes Susy Frizzle, MD  nitroGLYCERIN (NITROSTAT) 0.4 MG SL tablet Place 1 tablet (0.4 mg total) under the tongue as directed. 10/13/12  Yes Susy Frizzle, MD  budesonide-formoterol (SYMBICORT) 160-4.5 MCG/ACT inhaler INHALE ONE PUFF BY MOUTH TWICE DAILY Patient not  taking: Reported on 01/04/2015 12/16/13   Susy Frizzle, MD  pravastatin (PRAVACHOL) 80 MG tablet Take 1 tablet (80 mg total) by mouth daily. Patient not taking: Reported on 01/04/2015 04/13/14   Susy Frizzle, MD    Physical Exam: Filed Vitals:   07/15/15 0600 07/15/15 0630 07/15/15 0700 07/15/15 0854  BP: 162/65 154/58 161/64   Pulse: 63 54 62   Temp:    94.8 F (34.9 C)  TempSrc:    Rectal  Resp: '11 13 13   '$ Height:      Weight:      SpO2: 96% 98% 96%    Constitutional:  Pleasant well-developed white male in NAD, calm, comfortable Filed Vitals:   07/15/15 0600 07/15/15 0630 07/15/15 0700 07/15/15 0854  BP: 162/65 154/58 161/64     Pulse: 63 54 62   Temp:    94.8 F (34.9 C)  TempSrc:    Rectal  Resp: '11 13 13   '$ Height:      Weight:      SpO2: 96% 98% 96%    Eyes:  PERRL, lids and conjunctivae normal ENMT: Mucous membranes are moist. Posterior pharynx clear of any exudate or lesions.Normal dentition.  Neck: normal, supple, no masses Respiratory: clear to auscultation bilaterally, no wheezing, no crackles. Normal respiratory effort. No accessory muscle use.  Cardiovascular: Regular rate and rhythm, no murmurs / rubs / gallops. No extremity edema. 2+ pedal pulses. No carotid bruits.  Abdomen: no tenderness, no masses palpated. No hepatomegaly. Bowel sounds positive.  Musculoskeletal: no clubbing / cyanosis. No joint deformity upper and lower extremities. Good ROM, no contractures. Normal muscle tone.  Skin: no rashes, lesions, ulcers. No induration Neurologic: No focal deficits except mild left facial droop. Sensation intactl. Strength 5/5 in all 4.  Psychiatric: Normal judgment and insight. Alert and oriented x 3. Normal mood.   Labs on Admission: I have personally reviewed following labs and imaging studies  CBC:  Recent Labs Lab 07/15/15 0545 07/15/15 0604  WBC 12.4*  --   NEUTROABS 10.2*  --   HGB 13.6 15.6  HCT 42.2 46.0  MCV 98.4  --   PLT 133*  --    Basic Metabolic Panel:  Recent Labs Lab 07/15/15 0545 07/15/15 0604  NA 136 138  K 3.5 3.5  CL 103 101  CO2 22  --   GLUCOSE 229* 217*  BUN 33* 33*  CREATININE 1.51* 1.40*  CALCIUM 9.4  --    Coagulation Profile:  Recent Labs Lab 07/15/15 0545  INR 1.09    Radiological Exams on Admission: Mr Angiogram Head Wo Contrast  07/15/2015  CLINICAL DATA:  82 year old hypertensive male with diplopia and ataxia. Initial encounter. EXAM: MRI HEAD WITHOUT CONTRAST MRA HEAD WITHOUT CONTRAST TECHNIQUE: Multiplanar, multiecho pulse sequences of the brain and surrounding structures were obtained without intravenous contrast. Angiographic images of  the head were obtained using MRA technique without contrast. COMPARISON:  None. FINDINGS: MRI HEAD FINDINGS Moderately large acute mid to inferior right cerebellar infarct. There may be a tiny amount hemorrhage along the periphery. Abnormal appearance of the right vertebral artery. Remote right thalamic and left basal ganglia small infarcts. Mild chronic microvascular changes. Mild global atrophy without hydrocephalus. No intracranial mass lesion noted on this unenhanced exam. Post lens replacement without acute orbital abnormality noted. Minimal partial opacification mastoid air cells. Minimal mucosal thickening ethmoid sinus air cells. Prominent transverse ligament hypertrophy. Mild contact with the ventral aspect of the cervical medullary junction. Artifact limits  evaluation of C4 and C5. Currently, no cerebellar tonsil herniation or hydrocephalus. If infarct swells, patient may be at risk for development of hydrocephalus or downward herniation of the cerebellar tonsils. Partially empty expanded sella. MRA HEAD FINDINGS Right vertebral artery and right posterior inferior cerebellar artery are occluded. Mild narrowing distal left vertebral artery proximal to the takeoff of the left posterior inferior cerebellar artery. Mild to moderate narrowing portions of the left posterior inferior cerebellar artery. Ectatic basilar artery with slight irregularity without high-grade stenosis. Nonvisualized left anterior inferior cerebellar artery with small caliber right anterior inferior cerebellar artery. Mild irregularity proximal right posterior cerebral artery. Moderate narrowing mid and distal aspect left posterior cerebral artery. Distal right posterior cerebral artery branch vessel narrowing. Moderate to marked narrowing right internal carotid artery cavernous segment with regions of adjacent ectasia. Moderate narrowing right internal carotid artery supraclinoid segment. Ectatic irregular left internal carotid artery  cavernous segment. 2 mm bulge posterior aspect left internal carotid artery pre cavernous segment may represent tiny aneurysm or result of atherosclerotic changes. Mild narrowing left internal carotid artery supraclinoid segment Moderate narrowing M1 segment left middle cerebral artery. Mild irregularity M1 segment right middle cerebral artery without high-grade stenosis. Middle cerebral artery branches with areas of ectasia and moderate narrowing. Ectatic anterior cerebral arteries with moderate to marked narrowing portions of the A2 segment greater on the left. IMPRESSION: MRI HEAD Moderately large acute mid to inferior right cerebellar infarct. There may be a tiny amount hemorrhage along the periphery. Remote right thalamic and left basal ganglia small infarcts. Mild chronic microvascular changes. Mild global atrophy without hydrocephalus. Prominent transverse ligament hypertrophy. Mild contact with the ventral aspect of the cervical medullary junction. MRA HEAD Right vertebral artery and right posterior inferior cerebellar artery are occluded. Mild narrowing distal left vertebral artery proximal to the takeoff of the left posterior inferior cerebellar artery. Mild to moderate narrowing portions of the left posterior inferior cerebellar artery. Ectatic basilar artery with slight irregularity without high-grade stenosis. Nonvisualized left anterior inferior cerebellar artery with small caliber right anterior inferior cerebellar artery. Mild irregularity proximal right posterior cerebral artery. Moderate narrowing mid and distal aspect left posterior cerebral artery. Distal right posterior cerebral artery branch vessel narrowing. Moderate to marked narrowing right internal carotid artery cavernous segment with regions of adjacent ectasia. Moderate narrowing right internal carotid artery supraclinoid segment. Ectatic irregular left internal carotid artery cavernous segment. 2 mm bulge posterior aspect left internal  carotid artery pre cavernous segment may represent tiny aneurysm or result of atherosclerotic changes. Mild narrowing left internal carotid artery supraclinoid segment Moderate narrowing M1 segment left middle cerebral artery. Mild irregularity M1 segment right middle cerebral artery without high-grade stenosis. Middle cerebral artery branches with areas of ectasia and moderate narrowing. Ectatic anterior cerebral arteries with moderate to marked narrowing portions of the A2 segment greater on the left. These results were called by telephone at the time of interpretation on 07/15/2015 at 8:28 am to Dr. Lennette Bihari CAMPOS ; [place] who verbally acknowledged these results. Electronically Signed   By: Genia Del M.D.   On: 07/15/2015 08:59   Mr Brain Wo Contrast  07/15/2015  CLINICAL DATA:  82 year old hypertensive male with diplopia and ataxia. Initial encounter. EXAM: MRI HEAD WITHOUT CONTRAST MRA HEAD WITHOUT CONTRAST TECHNIQUE: Multiplanar, multiecho pulse sequences of the brain and surrounding structures were obtained without intravenous contrast. Angiographic images of the head were obtained using MRA technique without contrast. COMPARISON:  None. FINDINGS: MRI HEAD FINDINGS Moderately large acute  mid to inferior right cerebellar infarct. There may be a tiny amount hemorrhage along the periphery. Abnormal appearance of the right vertebral artery. Remote right thalamic and left basal ganglia small infarcts. Mild chronic microvascular changes. Mild global atrophy without hydrocephalus. No intracranial mass lesion noted on this unenhanced exam. Post lens replacement without acute orbital abnormality noted. Minimal partial opacification mastoid air cells. Minimal mucosal thickening ethmoid sinus air cells. Prominent transverse ligament hypertrophy. Mild contact with the ventral aspect of the cervical medullary junction. Artifact limits evaluation of C4 and C5. Currently, no cerebellar tonsil  herniation or hydrocephalus. If infarct swells, patient may be at risk for development of hydrocephalus or downward herniation of the cerebellar tonsils. Partially empty expanded sella. MRA HEAD FINDINGS Right vertebral artery and right posterior inferior cerebellar artery are occluded. Mild narrowing distal left vertebral artery proximal to the takeoff of the left posterior inferior cerebellar artery. Mild to moderate narrowing portions of the left posterior inferior cerebellar artery. Ectatic basilar artery with slight irregularity without high-grade stenosis. Nonvisualized left anterior inferior cerebellar artery with small caliber right anterior inferior cerebellar artery. Mild irregularity proximal right posterior cerebral artery. Moderate narrowing mid and distal aspect left posterior cerebral artery. Distal right posterior cerebral artery branch vessel narrowing. Moderate to marked narrowing right internal carotid artery cavernous segment with regions of adjacent ectasia. Moderate narrowing right internal carotid artery supraclinoid segment. Ectatic irregular left internal carotid artery cavernous segment. 2 mm bulge posterior aspect left internal carotid artery pre cavernous segment may represent tiny aneurysm or result of atherosclerotic changes. Mild narrowing left internal carotid artery supraclinoid segment Moderate narrowing M1 segment left middle cerebral artery. Mild irregularity M1 segment right middle cerebral artery without high-grade stenosis. Middle cerebral artery branches with areas of ectasia and moderate narrowing. Ectatic anterior cerebral arteries with moderate to marked narrowing portions of the A2 segment greater on the left. IMPRESSION: MRI HEAD Moderately large acute mid to inferior right cerebellar infarct. There may be a tiny amount hemorrhage along the periphery. Remote right thalamic and left basal ganglia small infarcts. Mild chronic microvascular changes. Mild global atrophy  without hydrocephalus. Prominent transverse ligament hypertrophy. Mild contact with the ventral aspect of the cervical medullary junction.  Ectatic basilar artery with slight irregularity without high-grade stenosis. Nonvisualized left anterior inferior cerebellar artery with small caliber right anterior inferior cerebellar artery. Mild irregularity proximal right posterior cerebral artery. Moderate narrowing mid and distal aspect left posterior cerebral artery. Distal right posterior cerebral artery branch vessel narrowing. Moderate to marked narrowing right internal carotid artery cavernous segment with regions of adjacent ectasia. Moderate narrowing right internal carotid artery supraclinoid segment. Ectatic irregular left internal carotid artery cavernous segment. 2 mm bulge posterior aspect left internal carotid artery pre cavernous segment may represent tiny aneurysm or result of atherosclerotic changes. Mild narrowing left internal carotid artery supraclinoid segment Moderate narrowing M1 segment left middle cerebral artery. Mild irregularity M1 segment right middle cerebral artery without high-grade stenosis. Middle cerebral artery branches with areas of ectasia and moderate narrowing. Ectatic anterior cerebral arteries with moderate to marked narrowing portions of the A2 segment greater on the left. These results were called by telephone at the time of interpretation on 07/15/2015 at 8:28 am to Dr. Lennette Bihari CAMPOS ; [place] who verbally acknowledged these results. Electronically Signed   By: Genia Del M.D.   On: 07/15/2015 08:59    EKG: Independently reviewed.   EKG Interpretation  Date/Time:  Sunday Jul 15 2015 05:25:58 EDT Ventricular Rate:  58 PR Interval:    QRS Duration: 109 QT Interval:  470 QTC Calculation: 462 R Axis:   63 Text Interpretation:  normal sinus rhythm Incomplete left bundle branch block Anterior Q waves, possibly due to ILBBB No significant change since  Confirmed by CAMPOS  MD, Lennette Bihari ([id number]) on 07/15/2015 7:37:20 AM Also confirmed by Venora Maples  MD, Lennette Bihari ([id number]), editor [place], Leda Gauze [phone])  on 07/15/2015 7:50:15 AM      Assessment/Plan  CVA (cerebral infarction). Moderately large acute mid to inferior right cerebellar infarct with possibly a tiny amount of hemorrhage along the periphery. Right vertebral artery and right posterior inferior cerebellar artery are occluded. Neck MRA pending. Patient looks and feels relatively well -Admit to Medical bed - Telemetry -Stroke order set utilized -Neurology has reviewed MRI, will see patient in consultation -Lipid panel, A1c, echocardiogram -Neuro checks every 2 hours x 48 hours per Neurology recommendations -PT, OT evaluation -RN swallow screen. Advance to Heart Healthy diet if passes, Otherwise, SLP evaluation  Hypothermia, etiology unclear. Rectal temp 94.8 on admission, placed on Bair Hugger. Repeat temp now above 96 degrees.  -can d/c Bair Hugger  Hypertension. Normotensive at present -Hold Cardia, Cozaar, metoprolol to allow for permissive hypertension  CKD stage 3. Cr 1.4 which is baseline   PVD, s/p aortobifemoral bypass graft and right femoral popliteal bypass grafting in May of 2012. Patient had carotid Dopplers performed in October which showed stable 40-59% bilateral internal carotid artery stenosis.  Diabetes mellitus, type 2 ([place]). Not on medication, managing with diet. Glucose 217 today -CBGs -SSI - sensitive -A1c  GERD, stable - continue home H2 blocker  Hyperlipidemia. -Fasting lipid panel -Continue home statin  Insomnia -Continue Xanax at bedtime as needed    DVT prophylaxis:   SCD Code Status:   Full code   Family Communication:  No family.  Disposition Plan: Discharge home in 2-3 days  Consults called:  Neurology Admission status:    Admission-  Stepdown    Tye Savoy NP [facility] Pager [phone]  If 7PM-7AM, please contact  night-coverage [email or web address] Password [email or web address]  07/15/2015, 9:25 AM

## 2015-07-16 ENCOUNTER — Inpatient Hospital Stay (HOSPITAL_COMMUNITY): Payer: Medicare Other

## 2015-07-16 DIAGNOSIS — N183 Chronic kidney disease, stage 3 unspecified: Secondary | ICD-10-CM | POA: Insufficient documentation

## 2015-07-16 DIAGNOSIS — I10 Essential (primary) hypertension: Secondary | ICD-10-CM

## 2015-07-16 DIAGNOSIS — E1159 Type 2 diabetes mellitus with other circulatory complications: Secondary | ICD-10-CM

## 2015-07-16 DIAGNOSIS — D72829 Elevated white blood cell count, unspecified: Secondary | ICD-10-CM | POA: Insufficient documentation

## 2015-07-16 DIAGNOSIS — E785 Hyperlipidemia, unspecified: Secondary | ICD-10-CM

## 2015-07-16 DIAGNOSIS — J449 Chronic obstructive pulmonary disease, unspecified: Secondary | ICD-10-CM

## 2015-07-16 DIAGNOSIS — I739 Peripheral vascular disease, unspecified: Secondary | ICD-10-CM

## 2015-07-16 DIAGNOSIS — I638 Other cerebral infarction: Secondary | ICD-10-CM

## 2015-07-16 DIAGNOSIS — I639 Cerebral infarction, unspecified: Secondary | ICD-10-CM

## 2015-07-16 DIAGNOSIS — I2510 Atherosclerotic heart disease of native coronary artery without angina pectoris: Secondary | ICD-10-CM

## 2015-07-16 DIAGNOSIS — I6501 Occlusion and stenosis of right vertebral artery: Secondary | ICD-10-CM

## 2015-07-16 DIAGNOSIS — I6789 Other cerebrovascular disease: Secondary | ICD-10-CM

## 2015-07-16 DIAGNOSIS — T68XXXD Hypothermia, subsequent encounter: Secondary | ICD-10-CM

## 2015-07-16 DIAGNOSIS — J441 Chronic obstructive pulmonary disease with (acute) exacerbation: Secondary | ICD-10-CM | POA: Insufficient documentation

## 2015-07-16 DIAGNOSIS — D696 Thrombocytopenia, unspecified: Secondary | ICD-10-CM | POA: Insufficient documentation

## 2015-07-16 LAB — LIPID PANEL
CHOL/HDL RATIO: 3.8 ratio
CHOLESTEROL: 177 mg/dL (ref 0–200)
HDL: 46 mg/dL (ref >40)
LDL Cholesterol: 101 mg/dL — ABNORMAL HIGH (ref 0–99)
Triglycerides: 152 mg/dL — ABNORMAL HIGH (ref <150)
VLDL: 30 mg/dL (ref 0–40)

## 2015-07-16 LAB — GLUCOSE, CAPILLARY
GLUCOSE-CAPILLARY: 137 mg/dL — AB (ref 65–99)
GLUCOSE-CAPILLARY: 139 mg/dL — AB (ref 65–99)
Glucose-Capillary: 122 mg/dL — ABNORMAL HIGH (ref 65–99)
Glucose-Capillary: 114 mg/dL — ABNORMAL HIGH (ref 65–99)

## 2015-07-16 LAB — ECHOCARDIOGRAM COMPLETE
HEIGHTINCHES: 66 in
Weight: 2336 oz

## 2015-07-16 MED ORDER — ATORVASTATIN CALCIUM 10 MG PO TABS
20.0000 mg | ORAL_TABLET | Freq: Every day | ORAL | Status: DC
  Administered 2015-07-16: 20 mg via ORAL
  Filled 2015-07-16 (×2): qty 2

## 2015-07-16 MED ORDER — MOMETASONE FURO-FORMOTEROL FUM 100-5 MCG/ACT IN AERO
2.0000 | INHALATION_SPRAY | Freq: Two times a day (BID) | RESPIRATORY_TRACT | Status: DC
  Filled 2015-07-16: qty 8.8

## 2015-07-16 MED ORDER — CLOPIDOGREL BISULFATE 75 MG PO TABS
75.0000 mg | ORAL_TABLET | Freq: Every day | ORAL | Status: DC
  Administered 2015-07-16 – 2015-07-17 (×2): 75 mg via ORAL
  Filled 2015-07-16 (×2): qty 1

## 2015-07-16 MED ORDER — METOPROLOL SUCCINATE ER 25 MG PO TB24
25.0000 mg | ORAL_TABLET | Freq: Every day | ORAL | Status: DC
  Administered 2015-07-16 – 2015-07-17 (×2): 25 mg via ORAL
  Filled 2015-07-16 (×2): qty 1

## 2015-07-16 NOTE — Progress Notes (Signed)
*  PRELIMINARY RESULTS* Echocardiogram 2D Echocardiogram has been performed.  Leavy Cella 07/16/2015, 11:53 AM

## 2015-07-16 NOTE — Consult Note (Signed)
Physical Medicine and Rehabilitation Consult Reason for Consult: Right cerebellar infarct Referring Physician: [facility]   HPI: Martin [name] is a 82 y.o. right handed male coronary artery disease with remote MI, hypertension, COPD with remote tobacco abuse, diabetes mellitus. Independent prior to admission living alone. 2 level home with bedroom upstairs. He has a brother who lives across the street but cannot provide assistance. Admitted 07/15/2015 with double vision and gait disturbance. MRI of the brain shows moderately large acute mid to inferior right cerebellar infarct. Remote right thalamic and left basal ganglia small infarcts. 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 Patient did not receive TPA. Carotid Dopplers with left 40-59% ICA stenosis. Echocardiogram pending. Neurology consulted presently maintained on aspirin and Plavix for CVA prophylaxis. Tolerating a regular diet. Physical therapy evaluation completed 07/16/2015 with recommendations of physical medicine rehabilitation consult.  Review of Systems  Constitutional: Negative for fever and chills.  HENT: Negative for hearing loss.   Eyes: Positive for double vision.  Respiratory: Positive for cough.        Shortness of breath with heavy exertion  Gastrointestinal: Positive for constipation. Negative for nausea.       GERD  Genitourinary: Negative for dysuria and hematuria.  Musculoskeletal: Positive for joint pain.  Skin: Negative for rash.  Neurological: Positive for dizziness. Negative for seizures, weakness and headaches.  All other systems reviewed and are negative.  Past Medical History  Diagnosis Date  . Hypertension   . Hyperlipidemia   . Myocardial infarction [facility])     1978 & 2011  . COPD (chronic obstructive pulmonary disease) ([place])   . Peripheral vascular disease, unspecified ([place])   . GERD (gastroesophageal reflux disease)   . Arthritis   . H/O: GI  bleed   . Hiatal hernia   . Diabetes mellitus   . Bronchitis   . Renal artery stenosis [facility])    Past Surgical History  Procedure Laterality Date  . Aortobifemoral bpg  07/22/10    and Right femoral-popliteal BPG   Family History  Problem Relation Age of Onset  . Cancer Father     LIVER CANCER   Social History:  reports that he quit smoking about 6 years ago. His smoking use included Cigarettes. He quit after 65 years of use. He has never used smokeless tobacco. He reports that he does not drink alcohol or use illicit drugs. Allergies:  Allergies  Allergen Reactions  . Penicillins Rash   Medications Prior to Admission  Medication Sig Dispense Refill  . ALPRAZolam (XANAX) 0.5 MG tablet TAKE ONE TABLET BY MOUTH AT BEDTIME AS NEEDED FOR ANXIETY OR INSOMNIA. 30 tablet 2  . aspirin 81 MG tablet Take 81 mg by mouth daily.      Marland Kitchen CARTIA XT 120 MG 24 hr capsule TAKE ONE CAPSULE BY MOUTH ONCE DAILY 30 capsule 0  . famotidine (PEPCID) 20 MG tablet TAKE ONE TABLET BY MOUTH TWICE DAILY 60 tablet 5  . losartan (COZAAR) 50 MG tablet TAKE ONE TABLET BY MOUTH ONCE DAILY 30 tablet 3  . metoprolol succinate (TOPROL-XL) 25 MG 24 hr tablet TAKE ONE TABLET BY MOUTH ONCE DAILY. 30 tablet 11  . nitroGLYCERIN (NITROSTAT) 0.4 MG SL tablet Place 1 tablet (0.4 mg total) under the tongue as directed. 25 tablet 1  . budesonide-formoterol (SYMBICORT) 160-4.5 MCG/ACT inhaler INHALE ONE PUFF BY MOUTH TWICE DAILY (Patient not taking: Reported on 01/04/2015) 11 g 6  . pravastatin (  PRAVACHOL) 80 MG tablet Take 1 tablet (80 mg total) by mouth daily. (Patient not taking: Reported on 01/04/2015) 90 tablet 3    Home: Story expects to be discharged to:: Private residence Living Arrangements: Alone Available Help at Discharge:  (brother lives across street not but able to help, "he sees demons.") Type of Home: House Home Access: Stairs to enter [employer] of Steps: 4 Entrance  Stairs-Rails: Right Home Layout: Two level, Bed/bath upstairs (clothes upstairs) Alternate Level Stairs-Number of Steps: 1 flight Alternate Level Stairs-Rails: Right Home Equipment: None  Functional History: Prior Function Level of Independence: Independent Comments: Cooks, cleans, drives. LIke to play golf Functional Status:  Mobility: Bed Mobility Overal bed mobility: Needs Assistance Bed Mobility: Supine to Sit, Sit to Supine Supine to sit: Modified independent (Device/Increase time), HOB elevated Sit to supine: Modified independent (Device/Increase time), HOB elevated General bed mobility comments: Use of rail to get to EOB. + dizziness with truncal sway noted.  Transfers Overall transfer level: Needs assistance Equipment used: Rolling walker (2 wheeled) Transfers: Sit to/from Stand Sit to Stand: Min assist, Min guard General transfer comment: Min guard to boost from EOB and Min A to steady upon standing. + dizziness.  Ambulation/Gait Ambulation/Gait assistance: Mod assist Ambulation Distance (Feet): 75 Feet Assistive device: Rolling walker (2 wheeled) Gait Pattern/deviations: Ataxic, Step-through pattern, Staggering right, Staggering left, Wide base of support General Gait Details: Ataxic gait pattern wtih tendency to stagger to the right. Right lean noted as well. Required assist with balance and RW negotiation and to decrease gait speed.    ADL:    Cognition: Cognition Overall Cognitive Status: Within Functional Limits for tasks assessed Orientation Level: Oriented X4 Cognition Arousal/Alertness: Awake/alert Behavior During Therapy: WFL for tasks assessed/performed Overall Cognitive Status: Within Functional Limits for tasks assessed  Blood pressure 152/68, pulse 78, temperature 98 F (36.7 C), temperature source Oral, resp. rate 18, height '5\' 6"'$  (1.676 m), weight 66.225 kg (146 lb), SpO2 95 %. Physical Exam  Vitals reviewed. Constitutional: He is oriented to  person, place, and time. He appears well-developed and well-nourished.  HENT:  Head: Normocephalic and atraumatic.  Eyes: Conjunctivae and EOM are normal.  Neck: Normal range of motion. Neck supple. No thyromegaly present.  Cardiovascular: Normal rate and regular rhythm.   Respiratory: Effort normal. No respiratory distress.  Good inspiratory effort. He has some upper airway congestion  GI: Soft. Bowel sounds are normal. He exhibits no distension.  Musculoskeletal: He exhibits no edema or tenderness.  Neurological: He is alert and oriented to person, place, and time.  Follow simple commands.  Fair awareness of deficits. Sensation intact to light touch DTRs symmetric Motor: B/l UE 5/5 proximal to distal B/l LE hip flexion 4+/5, knee extenion, ankle dorsi/plantarflexion 5/5 Right sided: ataxia and dysmetria  Skin: Skin is warm and dry.  Psychiatric: He has a normal mood and affect. His behavior is normal. Thought content normal.    Results for orders placed or performed during the hospital encounter of 07/15/15 (from the past 24 hour(s))  Lipid panel     Status: Abnormal   Collection Time: 07/16/15  5:01 AM  Result Value Ref Range   Cholesterol 177 0 - 200 mg/dL   Triglycerides 152 (H) <150 mg/dL   HDL 46 >40 mg/dL   Total CHOL/HDL Ratio 3.8 RATIO   VLDL 30 0 - 40 mg/dL   LDL Cholesterol 101 (H) 0 - 99 mg/dL  Glucose, capillary     Status: Abnormal  Collection Time: 07/16/15  7:01 AM  Result Value Ref Range   Glucose-Capillary 122 (H) 65 - 99 mg/dL   Comment 1 Notify RN    Comment 2 Document in Chart    Ct Head Wo Contrast  07/16/2015  CLINICAL DATA:  Follow-up examination for stroke. EXAM: CT HEAD WITHOUT CONTRAST TECHNIQUE: Contiguous axial images were obtained from the base of the skull through the vertex without intravenous contrast. COMPARISON:  Prior MRI from 07/15/2015. FINDINGS: Well demarcated hypodensity involving the right cerebellar hemisphere consistent with acute  ischemic infarcts. Overall size and distribution stable from previous MRI. No hemorrhagic transformation by CT. There is localized mass effect with partial effacement of the outflow tract of the fourth ventricle. Fourth ventricle still partially effaced as well. No hydrocephalus. No evidence for ventricular trapping. Basilar cisterns remain patent. No herniation through the foramen magnum. Atrophy with chronic microvascular ischemic disease noted. Remote lacunar infarct within the right thalamus. Additional small remote lacunar infarct within the left basal ganglia. Intracranial atherosclerosis again noted. No other acute large vessel territory infarct. No intracranial hemorrhage. No mass lesion. No extra-axial fluid collection. Scalp soft tissues within normal limits. No acute abnormality about the orbits. Patient is status post cataract extraction. Paranasal sinuses are clear.  No mastoid effusion. Calvarium intact. IMPRESSION: 1. Evolving acute ischemic right cerebellar infarct, stable in size and distribution relative to prior MRI. No CT evidence for hemorrhagic transformation. Mild localized edema with mass effect on the fourth ventricular outflow tract. Fourth ventricle itself remains patent. No hydrocephalus at this time. 2. No other new acute intracranial process. Electronically Signed   By: Jeannine Boga M.D.   On: 07/16/2015 06:52   Mr Angiogram Head Wo Contrast  07/15/2015  CLINICAL DATA:  82 year old hypertensive male with diplopia and ataxia. Initial encounter. EXAM: MRI HEAD WITHOUT CONTRAST MRA HEAD WITHOUT CONTRAST TECHNIQUE: Multiplanar, multiecho pulse sequences of the brain and surrounding structures were obtained without intravenous contrast. Angiographic images of the head were obtained using MRA technique without contrast. COMPARISON:  None. FINDINGS: MRI HEAD FINDINGS Moderately large acute mid to inferior right cerebellar infarct. There may be a tiny amount hemorrhage along the  periphery. Abnormal appearance of the right vertebral artery. Remote right thalamic and left basal ganglia small infarcts. Mild chronic microvascular changes. Mild global atrophy without hydrocephalus. No intracranial mass lesion noted on this unenhanced exam. Post lens replacement without acute orbital abnormality noted. Minimal partial opacification mastoid air cells. Minimal mucosal thickening ethmoid sinus air cells. Prominent transverse ligament hypertrophy. Mild contact with the ventral aspect of the cervical medullary junction. Artifact limits evaluation of C4 and C5. Currently, no cerebellar tonsil herniation or hydrocephalus. If infarct swells, patient may be at risk for development of hydrocephalus or downward herniation of the cerebellar tonsils. Partially empty expanded sella. MRA HEAD FINDINGS Right vertebral artery and right posterior inferior cerebellar artery are occluded. Mild narrowing distal left vertebral artery proximal to the takeoff of the left posterior inferior cerebellar artery. Mild to moderate narrowing portions of the left posterior inferior cerebellar artery. Ectatic basilar artery with slight irregularity without high-grade stenosis. Nonvisualized left anterior inferior cerebellar artery with small caliber right anterior inferior cerebellar artery. Mild irregularity proximal right posterior cerebral artery. Moderate narrowing mid and distal aspect left posterior cerebral artery. Distal right posterior cerebral artery branch vessel narrowing. Moderate to marked narrowing right internal carotid artery cavernous segment with regions of adjacent ectasia. Moderate narrowing right internal carotid artery supraclinoid segment. Ectatic irregular left internal carotid artery  cavernous segment. 2 mm bulge posterior aspect left internal carotid artery pre cavernous segment may represent tiny aneurysm or result of atherosclerotic changes. Mild narrowing left internal carotid artery supraclinoid  segment Moderate narrowing M1 segment left middle cerebral artery. Mild irregularity M1 segment right middle cerebral artery without high-grade stenosis. Middle cerebral artery branches with areas of ectasia and moderate narrowing. Ectatic anterior cerebral arteries with moderate to marked narrowing portions of the A2 segment greater on the left. IMPRESSION: MRI HEAD Moderately large acute mid to inferior right cerebellar infarct. There may be a tiny amount hemorrhage along the periphery. Remote right thalamic and left basal ganglia small infarcts. Mild chronic microvascular changes. Mild global atrophy without hydrocephalus. Prominent transverse ligament hypertrophy. Mild contact with the ventral aspect of the cervical medullary junction. MRA HEAD Right vertebral artery and right posterior inferior cerebellar artery are occluded. Mild narrowing distal left vertebral artery proximal to the takeoff of the left posterior inferior cerebellar artery. Mild to moderate narrowing portions of the left posterior inferior cerebellar artery. Ectatic basilar artery with slight irregularity without high-grade stenosis. Nonvisualized left anterior inferior cerebellar artery with small caliber right anterior inferior cerebellar artery. Mild irregularity proximal right posterior cerebral artery. Moderate narrowing mid and distal aspect left posterior cerebral artery. Distal right posterior cerebral artery branch vessel narrowing. Moderate to marked narrowing right internal carotid artery cavernous segment with regions of adjacent ectasia. Moderate narrowing right internal carotid artery supraclinoid segment. Ectatic irregular left internal carotid artery cavernous segment. 2 mm bulge posterior aspect left internal carotid artery pre cavernous segment may represent tiny aneurysm or result of atherosclerotic changes. Mild narrowing left internal carotid artery supraclinoid segment Moderate narrowing M1 segment left middle cerebral  artery. Mild irregularity M1 segment right middle cerebral artery without high-grade stenosis. Middle cerebral artery branches with areas of ectasia and moderate narrowing. Ectatic anterior cerebral arteries with moderate to marked narrowing portions of the A2 segment greater on the left. These results were called by telephone at the time of interpretation on 07/15/2015 at 8:28 am to Dr. Lennette Bihari CAMPOS ; [place] who verbally acknowledged these results. Electronically Signed   By: Genia Del M.D.   On: 07/15/2015 08:59   Mr Angiogram Neck W Wo Contrast  07/15/2015  CLINICAL DATA:  82 year old hypertensive male with acute right cerebellar infarct. Subsequent encounter. EXAM: MRA NECK WITHOUT AND WITH CONTRAST TECHNIQUE: Multiplanar and multiecho pulse sequences of the neck were obtained without and with intravenous contrast. Angiographic images of the neck were obtained using MRA technique without and with intravenous contrast. CONTRAST:  25m MULTIHANCE GADOBENATE DIMEGLUMINE 529 MG/ML IV SOLN COMPARISON:  MR brain and MR angiogram 07/15/2015. FINDINGS: Exam is limited. There appears to be flow within portions of the vertebral arteries bilaterally with right vertebral artery appearing to be occluded at the C2 level. Suggestion of mild to slightly moderate narrowing proximal left vertebral artery. Bilateral carotid bifurcation narrowing suspected and more notable on the right but not able to adequately grade stenosis on the current exam. IMPRESSION: Exam is limited. There appears to be flow within portions of the vertebral arteries bilaterally with right vertebral artery appearing to be occluded at the C2 level. Suggestion of mild to slightly moderate narrowing proximal left vertebral artery. Bilateral carotid bifurcation narrowing suspected and more notable on the right but not able to adequately grade stenosis on the current exam. Electronically Signed   By: SGenia DelM.D.   On: 07/15/2015  11:17   Mr Brain Wo Contrast  07/15/2015  CLINICAL DATA:  82 year old hypertensive male with diplopia and ataxia. Initial encounter. EXAM: MRI HEAD WITHOUT CONTRAST MRA HEAD WITHOUT CONTRAST TECHNIQUE: Multiplanar, multiecho pulse sequences of the brain and surrounding structures were obtained without intravenous contrast. Angiographic images of the head were obtained using MRA technique without contrast. COMPARISON:  None. FINDINGS: MRI HEAD FINDINGS Moderately large acute mid to inferior right cerebellar infarct. There may be a tiny amount hemorrhage along the periphery. Abnormal appearance of the right vertebral artery. Remote right thalamic and left basal ganglia small infarcts. Mild chronic microvascular changes. Mild global atrophy without hydrocephalus. No intracranial mass lesion noted on this unenhanced exam. Post lens replacement without acute orbital abnormality noted. Minimal partial opacification mastoid air cells. Minimal mucosal thickening ethmoid sinus air cells. Prominent transverse ligament hypertrophy. Mild contact with the ventral aspect of the cervical medullary junction. Artifact limits evaluation of C4 and C5. Currently, no cerebellar tonsil herniation or hydrocephalus. If infarct swells, patient may be at risk for development of hydrocephalus or downward herniation of the cerebellar tonsils. Partially empty expanded sella. MRA HEAD FINDINGS Right vertebral artery and right posterior inferior cerebellar artery are occluded. Mild narrowing distal left vertebral artery proximal to the takeoff of the left posterior inferior cerebellar artery. Mild to moderate narrowing portions of the left posterior inferior cerebellar artery. Ectatic basilar artery with slight irregularity without high-grade stenosis. Nonvisualized left anterior inferior cerebellar artery with small caliber right anterior inferior cerebellar artery. Mild irregularity proximal right posterior cerebral artery. Moderate  narrowing mid and distal aspect left posterior cerebral artery. Distal right posterior cerebral artery branch vessel narrowing. Moderate to marked narrowing right internal carotid artery cavernous segment with regions of adjacent ectasia. Moderate narrowing right internal carotid artery supraclinoid segment. Ectatic irregular left internal carotid artery cavernous segment. 2 mm bulge posterior aspect left internal carotid artery pre cavernous segment may represent tiny aneurysm or result of atherosclerotic changes. Mild narrowing left internal carotid artery supraclinoid segment Moderate narrowing M1 segment left middle cerebral artery. Mild irregularity M1 segment right middle cerebral artery without high-grade stenosis. Middle cerebral artery branches with areas of ectasia and moderate narrowing. Ectatic anterior cerebral arteries with moderate to marked narrowing portions of the A2 segment greater on the left. IMPRESSION: MRI HEAD Moderately large acute mid to inferior right cerebellar infarct. There may be a tiny amount hemorrhage along the periphery. Remote right thalamic and left basal ganglia small infarcts. Mild chronic microvascular changes. Mild global atrophy without hydrocephalus. Prominent transverse ligament hypertrophy. Mild contact with the ventral aspect of the cervical medullary junction. MRA HEAD Right vertebral artery and right posterior inferior cerebellar artery are occluded. Mild narrowing distal left vertebral artery proximal to the takeoff of the left posterior inferior cerebellar artery. Mild to moderate narrowing portions of the left posterior inferior cerebellar artery. Ectatic basilar artery with slight irregularity without high-grade stenosis. Nonvisualized left anterior inferior cerebellar artery with small caliber right anterior inferior cerebellar artery. Mild irregularity proximal right posterior cerebral artery. Moderate narrowing mid and distal aspect left posterior cerebral  artery. Distal right posterior cerebral artery branch vessel narrowing. Moderate to marked narrowing right internal carotid artery cavernous segment with regions of adjacent ectasia. Moderate narrowing right internal carotid artery supraclinoid segment. Ectatic irregular left internal carotid artery cavernous segment. 2 mm bulge posterior aspect left internal carotid artery pre cavernous segment may represent tiny aneurysm or result of atherosclerotic changes. Mild narrowing left internal carotid artery supraclinoid segment Moderate narrowing M1 segment left middle cerebral artery. Mild irregularity  M1 segment right middle cerebral artery without high-grade stenosis. Middle cerebral artery branches with areas of ectasia and moderate narrowing. Ectatic anterior cerebral arteries with moderate to marked narrowing portions of the A2 segment greater on the left. These results were called by telephone at the time of interpretation on 07/15/2015 at 8:28 am to Dr. Lennette Bihari CAMPOS ; [place] who verbally acknowledged these results. Electronically Signed   By: Genia Del M.D.   On: 07/15/2015 08:59    Assessment/Plan: Diagnosis: Right cerebellar infarct Labs and images independently reviewed.  Records reviewed and summated above. Stroke: Continue secondary stroke prophylaxis and Risk Factor Modification listed below:   Antiplatelet therapy:   Blood Pressure Management:  Continue current medication with prn's with permisive HTN per primary team Statin Agent:   Diabetes management:    1. Does the need for close, 24 hr/day medical supervision in concert with the patient's rehab needs make it unreasonable for this patient to be served in a less intensive setting? Yes  2. Co-Morbidities requiring supervision/potential complications: coronary artery disease with remote MI (cont meds), HTN (monitor and provide prns in accordance with increased physical exertion and pain), COPD with remote tobacco abuse  (monitor RR and O2 sats with increased physical activity), diabetes mellitus (Monitor in accordance with exercise and adjust meds as necessary), CKD (avoid nephrotoxic meds), leukocytosis (cont to monitor for signs and symptoms of infection, further workup if indicated), Thrombocytopenia (< 60,000/mm3 no resistive exercise) 3. Due to safety, disease management and patient education, does the patient require 24 hr/day rehab nursing? Yes 4. Does the patient require coordinated care of a physician, rehab nurse, PT (1-2 hrs/day, 5 days/week) and OT (1-2 hrs/day, 5 days/week) to address physical and functional deficits in the context of the above medical diagnosis(es)? Yes Addressing deficits in the following areas: balance, endurance, locomotion, transferring, toileting and psychosocial support 5. Can the patient actively participate in an intensive therapy program of at least 3 hrs of therapy per day at least 5 days per week? Yes 6. The potential for patient to make measurable gains while on inpatient rehab is excellent 7. Anticipated functional outcomes upon discharge from inpatient rehab are modified independent and supervision  with PT, modified independent with OT, n/a with SLP. 8. Estimated rehab length of stay to reach the above functional goals is: 7-12 days. 9. Does the patient have adequate social supports and living environment to accommodate these discharge functional goals? Potentially 10. Anticipated D/C setting: Home 11. Anticipated post D/C treatments: HH therapy and Home excercise program 12. Overall Rehab/Functional Prognosis: good  RECOMMENDATIONS: This patient's condition is appropriate for continued rehabilitative care in the following setting: CIR  Patient has agreed to participate in recommended program. Yes Note that insurance prior authorization may be required for reimbursement for recommended care.  Comment: Rehab Admissions Coordinator to follow up.  Delice Lesch,  MD 07/16/2015

## 2015-07-16 NOTE — Progress Notes (Signed)
PROGRESS NOTE  Martin [name]  Martin [name] DOB: 10/09/1930 DOA: 07/15/2015 PCP: Odette Fraction, MD Outpatient Specialists:  Vascular - Ruta Hinds, MD  Brief Narrative:   Martin [name] is a 82 y.o. male with medical history significant for but not limited to coronary artery disease, remote MI, COPD, diabetes, and PVD. Around 10:00 the night of admission, the patient noticed that both arms and both legs were cold and he had some mild nausea. Around 2 AM he noticed that his vision was blurred and he was unable to roll over in bed. After about 30 minutes patient was able to get out of bed and phone for help.  Patient tried to walk but his gait was very unsteady. EMS transported him to the ER where he was found to be hypothermic.  In the ER, he was found to have a moderate to large acute mid to inferior right cerebellar infarct.    Assessment & Plan:   Principal Problem:   CVA (cerebral infarction) Active Problems:   Hypertension   Peripheral vascular disease, unspecified ([place])   Diabetes mellitus, type 2 ([place])   Stroke ([place])   Hypothermia  CVA (cerebral infarction). Moderately large acute mid to inferior right cerebellar infarct with possibly a tiny amount of hemorrhage along the periphery. Right vertebral artery and right posterior inferior cerebellar artery are occluded.  - MRA brain: Right vertebral artery occluded at the C2 level with suggestion of moderate narrowing of the proximal left vertebral artery. -  Echocardiogram: No evidence of PFO or ASD or intracardiac thrombus. -  Carotid duplex: Right 1-39 percent ICA stenosis, left 40-59 percent ICA stenosis. Antegrade vertebral artery flow -  Telemetry:  NSR -  LDL 101, pravastatin changed to atorvastatin -  A1c pending -  Continue aspirin and plavix for three months, then continue monotherapy ASA '325mg'$  daily thereafter -  Repeat CT head on 07/18/2015 -  PT, OT recommending CIR -  Awaiting approval for CIR   Hypothermia,  etiology unclear.  -  UA negative -  Check CXR   Hypertension. Normotensive at present - Hold Cardia, Cozaar, metoprolol to allow for permissive hypertension  CKD stage 3. Cr 1.4 which is baseline   Carotid artery stenosis:  Right 1-39 percent ICA stenosis, left 40-59 percent ICA stenosis  PVD, s/p aortobifemoral bypass graft and right femoral popliteal bypass grafting in May of 2012.   Diabetes mellitus, type 2 ([place]). Not on medication, managing with diet. Glucose 217 today -CBGs well controlled -SSI - sensitive -A1c pending  COPD, stable,dulera substituted for symbicort  GERD, stable - continue home H2 blocker  Hyperlipidemia, LDL not at goal of < 70.   -  Pravastatin changed to atorvastatin  Insomnia -Continue Xanax at bedtime as needed   DVT prophylaxis: SCD Code Status: Full code  Family Communication: patient alone Disposition Plan:  To CIR   Consultants:   Neurology, Dr. Erlinda Hong  Procedures:  ECHO:  LVEF 55-60%, moderate LVH, diastolic dysfunction, elevated LV  filling pressure, normal LA size, trivial MR, normal IVC. Carotid duplex:  Right 1-39 percent ICA stenosis, left 40-59 percent ICA stenosis. Antegrade vertebral artery flow  Antimicrobials:   none   Subjective:  Patient states he has intermittent double vision and has difficulty controlling his right arm and right leg.  Right arm feels heavy.  No loss of sensation.  Denies confusion, slurred speech, difficulty finding words  Objective: Filed Vitals:   07/16/15 0200 07/16/15 0400 07/16/15 1011 07/16/15 1435  BP:  152/71 159/73 152/68 156/81  Pulse: 80 83 78 80  Temp: 98.6 F (37 C) 98.6 F (37 C) 98 F (36.7 C) 98 F (36.7 C)  TempSrc: Oral Oral Oral Oral  Resp: '20 18 18 18  '$ Height:      Weight:      SpO2: 95% 94% 95% 97%    Intake/Output Summary (Last 24 hours) at 07/16/15 1510 Last data filed at 07/16/15 1032  Gross per 24 hour  Intake    550 ml  Output    225 ml  Net    325  ml   Filed Weights   07/15/15 0525  Weight: 66.225 kg (146 lb)    Examination:  General exam:  Adult male.  No acute distress. Sitting in chair HEENT:  NCAT, MMM Respiratory system: Clear to auscultation. Respiratory effort normal. Cardiovascular system: S1 & S2 heard, RRR. No JVD, murmurs, rubs, gallops or clicks.  Warm extremities Gastrointestinal system: Abdomen is nondistended, soft and nontender. Normal bowel sounds heard. Skin: No rashes, lesions or ulcers MSK:  Normal tone and bulk, no lower extremity edema Neuro:  CN II-XII grossly intact, EOMI and no obvious nystagmus.  PERRL, 5/5 strength throughout, SITLT throughout.  Dysmetria of the right arm and right leg.   Psychiatry: Judgement and insight appear normal. Mood & affect appropriate.     Data Reviewed: I have personally reviewed following labs and imaging studies  CBC:  Recent Labs Lab 07/15/15 0545 07/15/15 0604  WBC 12.4*  --   NEUTROABS 10.2*  --   HGB 13.6 15.6  HCT 42.2 46.0  MCV 98.4  --   PLT 133*  --    Basic Metabolic Panel:  Recent Labs Lab 07/15/15 0545 07/15/15 0604  NA 136 138  K 3.5 3.5  CL 103 101  CO2 22  --   GLUCOSE 229* 217*  BUN 33* 33*  CREATININE 1.51* 1.40*  CALCIUM 9.4  --    GFR: Estimated Creatinine Clearance: 36.7 mL/min (by C-G formula based on Cr of 1.4). Liver Function Tests: No results for input(s): AST, ALT, ALKPHOS, BILITOT, PROT, ALBUMIN in the last 168 hours. No results for input(s): LIPASE, AMYLASE in the last 168 hours. No results for input(s): AMMONIA in the last 168 hours. Coagulation Profile:  Recent Labs Lab 07/15/15 0545  INR 1.09   Cardiac Enzymes: No results for input(s): CKTOTAL, CKMB, CKMBINDEX, TROPONINI in the last 168 hours. BNP (last 3 results) No results for input(s): PROBNP in the last 8760 hours. HbA1C: No results for input(s): HGBA1C in the last 72 hours. CBG:  Recent Labs Lab 07/16/15 0701 07/16/15 1433  GLUCAP 122* 137*    Lipid Profile:  Recent Labs  07/16/15 0501  CHOL 177  HDL 46  LDLCALC 101*  TRIG 152*  CHOLHDL 3.8   Thyroid Function Tests: No results for input(s): TSH, T4TOTAL, FREET4, T3FREE, THYROIDAB in the last 72 hours. Anemia Panel: No results for input(s): VITAMINB12, FOLATE, FERRITIN, TIBC, IRON, RETICCTPCT in the last 72 hours. Urine analysis:    Component Value Date/Time   COLORURINE YELLOW 07/15/2015 1129   APPEARANCEUR CLEAR 07/15/2015 1129   LABSPEC 1.016 07/15/2015 1129   PHURINE 7.0 07/15/2015 1129   GLUCOSEU 100* 07/15/2015 1129   HGBUR NEGATIVE 07/15/2015 1129   BILIRUBINUR NEGATIVE 07/15/2015 1129   KETONESUR NEGATIVE 07/15/2015 1129   PROTEINUR 100* 07/15/2015 1129   UROBILINOGEN 0.2 07/16/2010 1429   NITRITE NEGATIVE 07/15/2015 1129   LEUKOCYTESUR NEGATIVE 07/15/2015 1129  Sepsis Labs: '@LABRCNTIP'$ (procalcitonin:4,lacticidven:4)  )No results found for this or any previous visit (from the past 240 hour(s)).    Radiology Studies: Ct Head Wo Contrast  07/16/2015  CLINICAL DATA:  Follow-up examination for stroke. EXAM: CT HEAD WITHOUT CONTRAST TECHNIQUE: Contiguous axial images were obtained from the base of the skull through the vertex without intravenous contrast. COMPARISON:  Prior MRI from 07/15/2015. FINDINGS: Well demarcated hypodensity involving the right cerebellar hemisphere consistent with acute ischemic infarcts. Overall size and distribution stable from previous MRI. No hemorrhagic transformation by CT. There is localized mass effect with partial effacement of the outflow tract of the fourth ventricle. Fourth ventricle still partially effaced as well. No hydrocephalus. No evidence for ventricular trapping. Basilar cisterns remain patent. No herniation through the foramen magnum. Atrophy with chronic microvascular ischemic disease noted. Remote lacunar infarct within the right thalamus. Additional small remote lacunar infarct within the left basal ganglia.  Intracranial atherosclerosis again noted. No other acute large vessel territory infarct. No intracranial hemorrhage. No mass lesion. No extra-axial fluid collection. Scalp soft tissues within normal limits. No acute abnormality about the orbits. Patient is status post cataract extraction. Paranasal sinuses are clear.  No mastoid effusion. Calvarium intact. IMPRESSION: 1. Evolving acute ischemic right cerebellar infarct, stable in size and distribution relative to prior MRI. No CT evidence for hemorrhagic transformation. Mild localized edema with mass effect on the fourth ventricular outflow tract. Fourth ventricle itself remains patent. No hydrocephalus at this time. 2. No other new acute intracranial process. Electronically Signed   By: Jeannine Boga M.D.   On: 07/16/2015 06:52   Mr Angiogram Head Wo Contrast  07/15/2015  CLINICAL DATA:  82 year old hypertensive male with diplopia and ataxia. Initial encounter. EXAM: MRI HEAD WITHOUT CONTRAST MRA HEAD WITHOUT CONTRAST TECHNIQUE: Multiplanar, multiecho pulse sequences of the brain and surrounding structures were obtained without intravenous contrast. Angiographic images of the head were obtained using MRA technique without contrast. COMPARISON:  None. FINDINGS: MRI HEAD FINDINGS Moderately large acute mid to inferior right cerebellar infarct. There may be a tiny amount hemorrhage along the periphery. Abnormal appearance of the right vertebral artery. Remote right thalamic and left basal ganglia small infarcts. Mild chronic microvascular changes. Mild global atrophy without hydrocephalus. No intracranial mass lesion noted on this unenhanced exam. Post lens replacement without acute orbital abnormality noted. Minimal partial opacification mastoid air cells. Minimal mucosal thickening ethmoid sinus air cells. Prominent transverse ligament hypertrophy. Mild contact with the ventral aspect of the cervical medullary junction. Artifact limits evaluation of C4  and C5. Currently, no cerebellar tonsil herniation or hydrocephalus. If infarct swells, patient may be at risk for development of hydrocephalus or downward herniation of the cerebellar tonsils. Partially empty expanded sella. MRA HEAD FINDINGS Right vertebral artery and right posterior inferior cerebellar artery are occluded. Mild narrowing distal left vertebral artery proximal to the takeoff of the left posterior inferior cerebellar artery. Mild to moderate narrowing portions of the left posterior inferior cerebellar artery. Ectatic basilar artery with slight irregularity without high-grade stenosis. Nonvisualized left anterior inferior cerebellar artery with small caliber right anterior inferior cerebellar artery. Mild irregularity proximal right posterior cerebral artery. Moderate narrowing mid and distal aspect left posterior cerebral artery. Distal right posterior cerebral artery branch vessel narrowing. Moderate to marked narrowing right internal carotid artery cavernous segment with regions of adjacent ectasia. Moderate narrowing right internal carotid artery supraclinoid segment. Ectatic irregular left internal carotid artery cavernous segment. 2 mm bulge posterior aspect left internal carotid artery pre cavernous segment  may represent tiny aneurysm or result of atherosclerotic changes. Mild narrowing left internal carotid artery supraclinoid segment Moderate narrowing M1 segment left middle cerebral artery. Mild irregularity M1 segment right middle cerebral artery without high-grade stenosis. Middle cerebral artery branches with areas of ectasia and moderate narrowing. Ectatic anterior cerebral arteries with moderate to marked narrowing portions of the A2 segment greater on the left. IMPRESSION: MRI HEAD Moderately large acute mid to inferior right cerebellar infarct. There may be a tiny amount hemorrhage along the periphery. Remote right thalamic and left basal ganglia small infarcts. Mild chronic  microvascular changes. Mild global atrophy without hydrocephalus. Prominent transverse ligament hypertrophy. Mild contact with the ventral aspect of the cervical medullary junction. MRA HEAD Right vertebral artery and right posterior inferior cerebellar artery are occluded. Mild narrowing distal left vertebral artery proximal to the takeoff of the left posterior inferior cerebellar artery. Mild to moderate narrowing portions of the left posterior inferior cerebellar artery. Ectatic basilar artery with slight irregularity without high-grade stenosis. Nonvisualized left anterior inferior cerebellar artery with small caliber right anterior inferior cerebellar artery. Mild irregularity proximal right posterior cerebral artery. Moderate narrowing mid and distal aspect left posterior cerebral artery. Distal right posterior cerebral artery branch vessel narrowing. Moderate to marked narrowing right internal carotid artery cavernous segment with regions of adjacent ectasia. Moderate narrowing right internal carotid artery supraclinoid segment. Ectatic irregular left internal carotid artery cavernous segment. 2 mm bulge posterior aspect left internal carotid artery pre cavernous segment may represent tiny aneurysm or result of atherosclerotic changes. Mild narrowing left internal carotid artery supraclinoid segment Moderate narrowing M1 segment left middle cerebral artery. Mild irregularity M1 segment right middle cerebral artery without high-grade stenosis. Middle cerebral artery branches with areas of ectasia and moderate narrowing. Ectatic anterior cerebral arteries with moderate to marked narrowing portions of the A2 segment greater on the left. These results were called by telephone at the time of interpretation on 07/15/2015 at 8:28 am to Dr. Lennette Bihari CAMPOS ; [place] who verbally acknowledged these results. Electronically Signed   By: Genia Del M.D.   On: 07/15/2015 08:59   Mr Angiogram Neck W Wo  Contrast  07/15/2015  CLINICAL DATA:  82 year old hypertensive male with acute right cerebellar infarct. Subsequent encounter. EXAM: MRA NECK WITHOUT AND WITH CONTRAST TECHNIQUE: Multiplanar and multiecho pulse sequences of the neck were obtained without and with intravenous contrast. Angiographic images of the neck were obtained using MRA technique without and with intravenous contrast. CONTRAST:  55m MULTIHANCE GADOBENATE DIMEGLUMINE 529 MG/ML IV SOLN COMPARISON:  MR brain and MR angiogram 07/15/2015. FINDINGS: Exam is limited. There appears to be flow within portions of the vertebral arteries bilaterally with right vertebral artery appearing to be occluded at the C2 level. Suggestion of mild to slightly moderate narrowing proximal left vertebral artery. Bilateral carotid bifurcation narrowing suspected and more notable on the right but not able to adequately grade stenosis on the current exam. IMPRESSION: Exam is limited. There appears to be flow within portions of the vertebral arteries bilaterally with right vertebral artery appearing to be occluded at the C2 level. Suggestion of mild to slightly moderate narrowing proximal left vertebral artery. Bilateral carotid bifurcation narrowing suspected and more notable on the right but not able to adequately grade stenosis on the current exam. Electronically Signed   By: SGenia DelM.D.   On: 07/15/2015 11:17   Mr Brain Wo Contrast  07/15/2015  CLINICAL DATA:  82year old hypertensive male with diplopia and ataxia. Initial encounter.  EXAM: MRI HEAD WITHOUT CONTRAST MRA HEAD WITHOUT CONTRAST TECHNIQUE: Multiplanar, multiecho pulse sequences of the brain and surrounding structures were obtained without intravenous contrast. Angiographic images of the head were obtained using MRA technique without contrast. COMPARISON:  None. FINDINGS: MRI HEAD FINDINGS Moderately large acute mid to inferior right cerebellar infarct. There may be a tiny amount hemorrhage along  the periphery. Abnormal appearance of the right vertebral artery. Remote right thalamic and left basal ganglia small infarcts. Mild chronic microvascular changes. Mild global atrophy without hydrocephalus. No intracranial mass lesion noted on this unenhanced exam. Post lens replacement without acute orbital abnormality noted. Minimal partial opacification mastoid air cells. Minimal mucosal thickening ethmoid sinus air cells. Prominent transverse ligament hypertrophy. Mild contact with the ventral aspect of the cervical medullary junction. Artifact limits evaluation of C4 and C5. Currently, no cerebellar tonsil herniation or hydrocephalus. If infarct swells, patient may be at risk for development of hydrocephalus or downward herniation of the cerebellar tonsils. Partially empty expanded sella. MRA HEAD FINDINGS Right vertebral artery and right posterior inferior cerebellar artery are occluded. Mild narrowing distal left vertebral artery proximal to the takeoff of the left posterior inferior cerebellar artery. Mild to moderate narrowing portions of the left posterior inferior cerebellar artery. Ectatic basilar artery with slight irregularity without high-grade stenosis. Nonvisualized left anterior inferior cerebellar artery with small caliber right anterior inferior cerebellar artery. Mild irregularity proximal right posterior cerebral artery. Moderate narrowing mid and distal aspect left posterior cerebral artery. Distal right posterior cerebral artery branch vessel narrowing. Moderate to marked narrowing right internal carotid artery cavernous segment with regions of adjacent ectasia. Moderate narrowing right internal carotid artery supraclinoid segment. Ectatic irregular left internal carotid artery cavernous segment. 2 mm bulge posterior aspect left internal carotid artery pre cavernous segment may represent tiny aneurysm or result of atherosclerotic changes. Mild narrowing left internal carotid artery  supraclinoid segment Moderate narrowing M1 segment left middle cerebral artery. Mild irregularity M1 segment right middle cerebral artery without high-grade stenosis. Middle cerebral artery branches with areas of ectasia and moderate narrowing. Ectatic anterior cerebral arteries with moderate to marked narrowing portions of the A2 segment greater on the left. IMPRESSION: MRI HEAD Moderately large acute mid to inferior right cerebellar infarct. There may be a tiny amount hemorrhage along the periphery. Remote right thalamic and left basal ganglia small infarcts. Mild chronic microvascular changes. Mild global atrophy without hydrocephalus. Prominent transverse ligament hypertrophy. Mild contact with the ventral aspect of the cervical medullary junction. MRA HEAD Right vertebral artery and right posterior inferior cerebellar artery are occluded. Mild narrowing distal left vertebral artery proximal to the takeoff of the left posterior inferior cerebellar artery. Mild to moderate narrowing portions of the left posterior inferior cerebellar artery. Ectatic basilar artery with slight irregularity without high-grade stenosis. Nonvisualized left anterior inferior cerebellar artery with small caliber right anterior inferior cerebellar artery. Mild irregularity proximal right posterior cerebral artery. Moderate narrowing mid and distal aspect left posterior cerebral artery. Distal right posterior cerebral artery branch vessel narrowing. Moderate to marked narrowing right internal carotid artery cavernous segment with regions of adjacent ectasia. Moderate narrowing right internal carotid artery supraclinoid segment. Ectatic irregular left internal carotid artery cavernous segment. 2 mm bulge posterior aspect left internal carotid artery pre cavernous segment may represent tiny aneurysm or result of atherosclerotic changes. Mild narrowing left internal carotid artery supraclinoid segment Moderate narrowing M1 segment left  middle cerebral artery. Mild irregularity M1 segment right middle cerebral artery without high-grade stenosis. Middle cerebral artery branches  with areas of ectasia and moderate narrowing. Ectatic anterior cerebral arteries with moderate to marked narrowing portions of the A2 segment greater on the left. These results were called by telephone at the time of interpretation on 07/15/2015 at 8:28 am to Dr. Lennette Bihari CAMPOS ; [place] who verbally acknowledged these results. Electronically Signed   By: Genia Del M.D.   On: 07/15/2015 08:59     Scheduled Meds: .  stroke: mapping our early stages of recovery book   Does not apply Once  . aspirin EC  325 mg Oral Daily   Or  . aspirin  300 mg Rectal Daily  . atorvastatin  20 mg Oral q1800  . clopidogrel  75 mg Oral Daily  . famotidine  20 mg Oral BID  . metoprolol succinate  25 mg Oral Daily  . mometasone-formoterol  2 puff Inhalation BID   Continuous Infusions: . sodium chloride 50 mL/hr at 07/15/15 2100     LOS: 1 day    Time spent: 30 min    Janece Canterbury, MD [facility] Pager [phone]  If 7PM-7AM, please contact night-coverage [email or web address] Password [email or web address] 07/16/2015, 3:10 PM

## 2015-07-16 NOTE — Evaluation (Signed)
Physical Therapy Evaluation Patient Details Name: Martin [name] MRN: [id number] DOB: 09-26-1930 Today's Date: 07/16/2015   History of Present Illness  Patient is a 82 y/o male with hx of HTN, HLD, MI, COPD, PVD, DM and recent carpal tunnel surgery on right wrist 5 weeks ago per pt report presents with sudden onset of equilibrium. MRI- right cerebellar infarct.  Clinical Impression  Patient presents with incoordination RUE/LE, ataxia and impaired balance s/p CVA impacting mobility. Requires Mod A for gait training due to imbalance and ataxia. Pt lives alone and was independent, playing golf PTA. Pt highly motivated to return to functional independence. Would benefit from CIR to maximize independence and mobility prior to return home. Will follow acutely.     Follow Up Recommendations CIR    Equipment Recommendations  Other (comment) (TBD)    Recommendations for Other Services Rehab consult     Precautions / Restrictions Precautions Precautions: Fall Precaution Comments: ataxia Restrictions Weight Bearing Restrictions: No      Mobility  Bed Mobility Overal bed mobility: Needs Assistance Bed Mobility: Supine to Sit;Sit to Supine     Supine to sit: Modified independent (Device/Increase time);HOB elevated Sit to supine: Modified independent (Device/Increase time);HOB elevated   General bed mobility comments: Use of rail to get to EOB. + dizziness with truncal sway noted.   Transfers Overall transfer level: Needs assistance Equipment used: Rolling walker (2 wheeled) Transfers: Sit to/from Stand Sit to Stand: Min assist;Min guard         General transfer comment: Min guard to boost from EOB and Min A to steady upon standing. + dizziness.   Ambulation/Gait Ambulation/Gait assistance: Mod assist Ambulation Distance (Feet): 75 Feet Assistive device: Rolling walker (2 wheeled) Gait Pattern/deviations: Ataxic;Step-through pattern;Staggering right;Staggering left;Wide base of  support     General Gait Details: Ataxic gait pattern wtih tendency to stagger to the right. Right lean noted as well. Required assist with balance and RW negotiation and to decrease gait speed.  Stairs            Wheelchair Mobility    Modified Rankin (Stroke Patients Only) Modified Rankin (Stroke Patients Only) Pre-Morbid Rankin Score: No symptoms Modified Rankin: Moderately severe disability     Balance Overall balance assessment: Needs assistance Sitting-balance support: Feet supported;No upper extremity supported Sitting balance-Leahy Scale: Fair Sitting balance - Comments: Assist to donn socks due to dizziness .   Standing balance support: During functional activity Standing balance-Leahy Scale: Fair Standing balance comment: Able to stand unsupported with Min guard assist for safety to urinate.                              Pertinent Vitals/Pain Pain Assessment: No/denies pain    Home Living Family/patient expects to be discharged to:: Private residence Living Arrangements: Alone Available Help at Discharge:  (brother lives across street not but able to help, "he sees demons.") Type of Home: House Home Access: Stairs to enter Entrance Stairs-Rails: Right Entrance Stairs-Number of Steps: 4 Home Layout: Two level;Bed/bath upstairs (clothes upstairs) Home Equipment: None      Prior Function Level of Independence: Independent         Comments: Cooks, cleans, drives. LIke to play golf     Hand Dominance   Dominant Hand: Right    Extremity/Trunk Assessment   Upper Extremity Assessment: Defer to OT evaluation;RUE deficits/detail RUE Deficits / Details: Incoordination noted during FTN testing with overshooting, undershooting target.  RUE Sensation:  [facility].)     Lower Extremity Assessment: RLE deficits/detail RLE Deficits / Details: Incoordination and ataxia noted in RLE during functional mobility.       Communication    Communication: No difficulties  Cognition Arousal/Alertness: Awake/alert Behavior During Therapy: WFL for tasks assessed/performed Overall Cognitive Status: Within Functional Limits for tasks assessed                      General Comments      Exercises        Assessment/Plan    PT Assessment Patient needs continued PT services  PT Diagnosis Difficulty walking;Abnormality of gait   PT Problem List Decreased coordination;Decreased balance;Decreased mobility  PT Treatment Interventions Balance training;Gait training;Functional mobility training;Therapeutic activities;Therapeutic exercise;Patient/family education;Stair training;Neuromuscular re-education   PT Goals (Current goals can be found in the Care Plan section) Acute Rehab PT Goals Patient Stated Goal: to return to independence PT Goal Formulation: With patient Time For Goal Achievement: 07/30/15 Potential to Achieve Goals: Good    Frequency Min 4X/week   Barriers to discharge Decreased caregiver support;Inaccessible home environment lives alone and has stairs at home    Co-evaluation               End of Session Equipment Utilized During Treatment: Gait belt Activity Tolerance: Patient tolerated treatment well Patient left: in bed;with call bell/phone within reach;with bed alarm set;with SCD's reapplied;Other (comment) (neurologist in room) Nurse Communication: Mobility status         Time: 5110-2111 PT Time Calculation (min) (ACUTE ONLY): 26 min   Charges:   PT Evaluation $PT Eval Moderate Complexity: 1 Procedure PT Treatments $Gait Training: 8-22 mins   PT G Codes:        Martin [name] 07/16/2015, 10:49 AM Wray Kearns, PT, DPT [phone]

## 2015-07-16 NOTE — Progress Notes (Signed)
STROKE TEAM PROGRESS NOTE   HISTORY OF PRESENT ILLNESS Martin [name] is a 82 y.o. male with history of hypertension, hyperlipidemia, myocardial infarction, diabetes who presents with sudden onset of this equilibrium and having sometime between 10 and 2:30 AM (LKW 07/14/15 at 2200) he states that it was up at around 10 feeling relatively normal, and then when he awoke 2:30 AM he was unable to get out of bed.On arrival, his outside of any intervention or TPA window and therefore MRI was ordered which demonstrates a moderate-sized cerebellar infarct. Patient was not administered IV t-PA secondary to being outside the window. He was admitted for further evaluation and treatment.   SUBJECTIVE (INTERVAL HISTORY) He just finished working with PT, who is now leaving the room. He recounted hx with Dr. Erlinda Hong. No family is at the bedside.  Overall he feels his condition is unchanged. He remains ataxic with double vision.  He denies any hx of recent head trauma, over exertion, or aggressive work out. Denies hx of stroke in the past. Stated binocular diplopia on the left gaze which has no change since yesterday. Repeat CT this am showed local mass effect but no hydrocephalus.    OBJECTIVE Temp:  [95.6 F (35.3 C)-98.6 F (37 C)] 98 F (36.7 C) (05/08 1011) Pulse Rate:  [76-84] 78 (05/08 1011) Cardiac Rhythm:  [-] Normal sinus rhythm;Heart block (05/07 2117) Resp:  [18-20] 18 (05/08 1011) BP: (148-163)/(64-80) 152/68 mmHg (05/08 1011) SpO2:  [92 %-96 %] 95 % (05/08 1011)  CBC:   Recent Labs Lab 07/15/15 0545 07/15/15 0604  WBC 12.4*  --   NEUTROABS 10.2*  --   HGB 13.6 15.6  HCT 42.2 46.0  MCV 98.4  --   PLT 133*  --     Basic Metabolic Panel:   Recent Labs Lab 07/15/15 0545 07/15/15 0604  NA 136 138  K 3.5 3.5  CL 103 101  CO2 22  --   GLUCOSE 229* 217*  BUN 33* 33*  CREATININE 1.51* 1.40*  CALCIUM 9.4  --     Lipid Panel:     Component Value Date/Time   CHOL 177 07/16/2015 0501    TRIG 152* 07/16/2015 0501   HDL 46 07/16/2015 0501   CHOLHDL 3.8 07/16/2015 0501   VLDL 30 07/16/2015 0501   LDLCALC 101* 07/16/2015 0501   HgbA1c:  Lab Results  Component Value Date   HGBA1C 6.2* 08/11/2014   Urine Drug Screen:     Component Value Date/Time   LABOPIA NONE DETECTED 07/15/2015 1130   COCAINSCRNUR NONE DETECTED 07/15/2015 1130   LABBENZ POSITIVE* 07/15/2015 1130   AMPHETMU NONE DETECTED 07/15/2015 1130   THCU NONE DETECTED 07/15/2015 1130   LABBARB NONE DETECTED 07/15/2015 1130      IMAGING  Ct Head Wo Contrast 07/16/2015  1. Evolving acute ischemic right cerebellar infarct, stable in size and distribution relative to prior MRI. No CT evidence for hemorrhagic transformation. Mild localized edema with mass effect on the fourth ventricular outflow tract. Fourth ventricle itself remains patent. No hydrocephalus at this time. 2. No other new acute intracranial process.   Mr Angiogram Neck W Wo Contrast 07/15/2015   Exam is limited. There appears to be flow within portions of the vertebral arteries bilaterally with right vertebral artery appearing to be occluded at the C2 level. Suggestion of mild to slightly moderate narrowing proximal left vertebral artery. Bilateral carotid bifurcation narrowing suspected and more notable on the right but not able to adequately grade  stenosis on the current exam.   MRI HEAD  07/15/2015  Moderately large acute mid to inferior right cerebellar infarct. There may be a tiny amount hemorrhage along the periphery. Remote right thalamic and left basal ganglia small infarcts. Mild chronic microvascular changes. Mild global atrophy without hydrocephalus. Prominent transverse ligament hypertrophy. Mild contact with the ventral aspect of the cervical medullary junction.   MRA HEAD  07/15/2015  Right vertebral artery and right posterior inferior cerebellar artery are occluded. Mild narrowing distal left vertebral artery proximal to the takeoff of the  left posterior inferior cerebellar artery. Mild to moderate narrowing portions of the left posterior inferior cerebellar artery. Ectatic basilar artery with slight irregularity without high-grade stenosis. Nonvisualized left anterior inferior cerebellar artery with small caliber right anterior inferior cerebellar artery. Mild irregularity proximal right posterior cerebral artery. Moderate narrowing mid and distal aspect left posterior cerebral artery. Distal right posterior cerebral artery branch vessel narrowing. Moderate to marked narrowing right internal carotid artery cavernous segment with regions of adjacent ectasia. Moderate narrowing right internal carotid artery supraclinoid segment. Ectatic irregular left internal carotid artery cavernous segment. 2 mm bulge posterior aspect left internal carotid artery pre cavernous segment may represent tiny aneurysm or result of atherosclerotic changes. Mild narrowing left internal carotid artery supraclinoid segment Moderate narrowing M1 segment left middle cerebral artery. Mild irregularity M1 segment right middle cerebral artery without high-grade stenosis. Middle cerebral artery branches with areas of ectasia and moderate narrowing. Ectatic anterior cerebral arteries with moderate to marked narrowing portions of the A2 segment greater on the left.   TTE - pending  CUS - pending   PHYSICAL EXAM  Temp:  [97.3 F (36.3 C)-98.6 F (37 C)] 98 F (36.7 C) (05/08 1011) Pulse Rate:  [77-84] 78 (05/08 1011) Resp:  [18-20] 18 (05/08 1011) BP: (148-159)/(64-80) 152/68 mmHg (05/08 1011) SpO2:  [94 %-96 %] 95 % (05/08 1011)  General - Well nourished, well developed, in no apparent distress.  Ophthalmologic - Fundi not visualized due to noncooperation.  Cardiovascular - Regular rate and rhythm.  Mental Status -  Level of arousal and orientation to time, place, and person were intact. Language including expression, naming, repetition, comprehension was  assessed and found intact. Fund of Knowledge was assessed and was intact.  Cranial Nerves II - XII - II - Visual field intact OU. III, IV, VI - Extraocular movements intact, however, he complains of binocular diplopia on left gaze. V - Facial sensation intact bilaterally. VII - Facial movement intact bilaterally. VIII - Hearing & vestibular intact bilaterally, no obvious nystagmus. X - Palate elevates symmetrically. XI - Chin turning & shoulder shrug intact bilaterally. XII - Tongue protrusion intact.  Motor Strength - The patient's strength was normal in all extremities and pronator drift was absent.  Bulk was normal and fasciculations were absent.   Motor Tone - Muscle tone was assessed at the neck and appendages and was normal.  Reflexes - The patient's reflexes were 1+ in all extremities and he had no pathological reflexes.  Sensory - Light touch, temperature/pinprick were assessed and were symmetrical.    Coordination - The patient had right FTN and HTS ataxia, left UE and LE intact, symmetrical on rapid alternative movement bilaterally.  Tremor was absent.  Gait and Station - walk with walker, significant ataxia on the right LE, no truncal ataxia .   ASSESSMENT/PLAN Martin [name] is a 82 y.o. male with history of  hypertension, hyperlipidemia, myocardial infarction, diabetes presenting with sudden onset  dizziness. He did not receive IV t-PA due to delay in arrival.   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 Old right thalamic basal ganglia infarcts. small vessel disease   MRA head R VA and R PICA occluded. Extensive posterior circulation atherosclerotic disease  MRA neck  R VA occluded at C2 level. Exam limited for carotid evaluation  Repeat CT head in 2 days to  assess mass effect and hydrocephalus  Carotid Doppler  pending   2D Echo  pending   LDL 101  HgbA1c pending  SCDs for VTE prophylaxis Diet Heart Room service appropriate?: Yes; Fluid consistency:: Thin  aspirin 81 mg daily prior to admission, now on aspirin 325 mg daily . Given large vessel intracranial atherosclerosis, patient should be treated with aspirin 325 mg and clopidogrel 75 mg orally every day x 3 months for secondary stroke prevention. After 3 months, change to plavix alone. Long-term dual antiplatelets are contraindicated due to risk for intracerebral hemorrhage.   Patient counseled to be compliant with his antithrombotic medications  Ongoing aggressive stroke risk factor management  Therapy recommendations:  pending   Consider for Stroke AF, but pt declined  Disposition:  pending   Cerebral edema  Repeat CT showed focal mass effect but no hydrocephalus  Will repeat CT in 2 days  Close neuro check  Stat CT if neuro changes.  Carotid stenosis  October 2015, bilateral 40-59% ICA stenosis  CUS pending  Asymptomatic this time  Hypertension  Stable Permissive hypertension (OK if < 220/120) but gradually normalize in 5-7 days  Hyperlipidemia  Home meds:  Pravachol 80  LDL 101, goal < 70  Now on lipitor 20  Continue statin at discharge  Diabetes type II  HgbA1c pending , goal < 7.0  Other Stroke Risk Factors  Advanced age  Former Cigarette smoker, quit smoking 6 years ago   Coronary artery disease s/p MI 1978 and 2011  Peripheral vascular disease s/p Aorto bifemoral bypass graft and right femoral popliteal bypass graft May 2012  Other Active Problems  Hypothermia, etiology unclear, placed on bair hugger initially  Chronic kidney disease stage III  GERD  Insomnia  Hospital day # [facility] for Pager information 07/16/2015 10:48 AM   I, the attending vascular neurologist, have personally  obtained a history, examined the patient, evaluated laboratory data, individually viewed imaging studies and agree with radiology interpretations. I obtained additional history from pt at bedside. Together with the NP/PA, we formulated the assessment and plan of care which reflects our mutual decision.  I have made any additions or clarifications directly to the above note and agree with the findings and plan as currently documented.   82 yo M with multiple stroke risk factors including HTN, HLD, MI, PVD, DM admitted for right PICA infarct. MRA showed right VA occlusion, consistent with large vessel etiology. Repeat CT showed focal swelling but no hydrocephalus and pt neuro stable. Will close neuro check and repeat CT in 2 days. Stat CT if neuro changes. Continue stroke work up with CUS and TTE. Recommend DAPT and statin, PT/OT.   Rosalin Hawking, MD PhD Stroke Neurology 07/16/2015 12:01 PM       To contact Stroke Continuity provider, please refer to [email or web address] After hours, contact General  Neurology

## 2015-07-16 NOTE — Care Management Note (Signed)
Case Management Note  Patient Details  Name: Martin [name] MRN: [id number] Date of Birth: 1931/02/10  Subjective/Objective:                    Action/Plan: Patient was admitted with CVA. Lives at home alone. Will follow for discharge needs pending PT/OT evals and physician orders.  Expected Discharge Date:                  Expected Discharge Plan:     In-House Referral:     Discharge planning Services     Post Acute Care Choice:    Choice offered to:     DME Arranged:    DME Agency:     HH Arranged:    HH Agency:     Status of Service:  In process, will continue to follow  Medicare Important Message Given:    Date Medicare IM Given:    Medicare IM give by:    Date Additional Medicare IM Given:    Additional Medicare Important Message give by:     If discussed at [place] of Stay Meetings, dates discussed:    Additional CommentsRolm Baptise, RN 07/16/2015, 1:48 PM [phone]

## 2015-07-16 NOTE — Progress Notes (Signed)
VASCULAR LAB PRELIMINARY  PRELIMINARY  PRELIMINARY  PRELIMINARY  Carotid duplex completed.    Preliminary report:  Right: 1-39% ICA stenosis.  Left: 40-59% ICA stenosis. Vertebral artery flow is antegrade.   Rutherford Alarie, RVT 07/16/2015, 11:35 AM

## 2015-07-16 NOTE — Evaluation (Signed)
Occupational Therapy Evaluation Patient Details Name: Martin [name] MRN: [id number] DOB: [birth year] Today's Date: 07/16/2015    History of Present Illness Patient is an 82 y.o. male with hx of HTN, arthritis, renal artery stenosis, HLD, MI, COPD, PVD, DM and recent carpal tunnel surgery on right wrist 5 weeks ago per pt report.  Pt presented to ED with ataxia and diplopia. MRI- right cerebellar infarct.   Clinical Impression   Pt admitted with above. Pt independent with ADLs, PTA. Feel pt will benefit from acute OT to increase independence prior to d/c. Recommending CIR for rehab and feel pt will greatly benefit.     Follow Up Recommendations  CIR;Supervision/Assistance - 24 hour    Equipment Recommendations  Other (comment) (defer to next venue)    Recommendations for Other Services       Precautions / Restrictions Precautions Precautions: Fall Precaution Comments: ataxia Restrictions Weight Bearing Restrictions: No      Mobility Bed Mobility Overal bed mobility: Needs Assistance Bed Mobility: Supine to Sit     Supine to sit: Supervision     General bed mobility comments: pt dizzy   Transfers Overall transfer level: Needs assistance   Transfers: Sit to/from Stand Sit to Stand/Stand to Sit: Min assist         General transfer comment: Min A 1x for stand to sit to chair, otherwise pt Min guard level for sit to stand. RW in front of pt. Cues for hand placement.    Balance    Mod A for ambulation and pt used RW. Assist for standing balance during functional task.                                        ADL Overall ADL's : Needs assistance/impaired                     Lower Body Dressing: Minimal assistance;Sit to/from stand   Toilet Transfer: Moderate assistance;Ambulation;RW (sit to stand from chair and bed)   Toileting- Clothing Manipulation and Hygiene: Minimal assistance;Sit to/from stand Toileting - Clothing Manipulation  Details (indicate cue type and reason): assist given for balance as pt was trying to manage urinal     Functional mobility during ADLs: Moderate assistance;Rolling walker       Vision  Pt reports he had been seeing double vision (unsure if it was today or another day) but was not seeing double when OT asked in session.   Perception     Praxis      Pertinent Vitals/Pain Pain Assessment: No/denies pain     Hand Dominance     Extremity/Trunk Assessment Upper Extremity Assessment Upper Extremity Assessment: RUE deficits/detail (decreased AROM bilateral shoulder flexion-reports arthritis) RUE Coordination: decreased gross motor (pt reports that fine motor coordination was more effortful when testing with finger opposition test)   Lower Extremity Assessment Lower Extremity Assessment: Defer to PT evaluation       Communication Communication Communication: No difficulties   Cognition Arousal/Alertness: Awake/alert Behavior During Therapy: WFL for tasks assessed/performed Overall Cognitive Status: No family/caregiver present to determine baseline cognitive functioning       Memory: Decreased short-term memory             General Comments       Exercises       Shoulder Instructions      Home Living Family/patient expects to be discharged  to:: Private residence Living Arrangements: Alone   Type of Home: House Home Access: Stairs to enter Technical brewer of Steps: 4 Entrance Stairs-Rails: Right Home Layout: Two level;Bed/bath upstairs (clothes upstairs) Alternate Level Stairs-Number of Steps: 1 flight Alternate Level Stairs-Rails: Right Bathroom Shower/Tub: Walk-in shower         Home Equipment: Shower seat - built in;Grab bars - tub/shower          Prior Functioning/Environment Level of Independence: Needs assistance    ADL's / Homemaking Assistance Needed: assist with yardwork   Comments: Cooks, cleans, drives. Likes to play golf    OT  Diagnosis: Ataxia   OT Problem List: Decreased range of motion;Decreased activity tolerance;Impaired balance (sitting and/or standing);Decreased knowledge of use of DME or AE;Decreased knowledge of precautions;Decreased cognition;Decreased coordination;Impaired UE functional use   OT Treatment/Interventions: Self-care/ADL training;Therapeutic exercise;DME and/or AE instruction;Therapeutic activities;Cognitive remediation/compensation;Patient/family education;Balance training    OT Goals(Current goals can be found in the care plan section) Acute Rehab OT Goals Patient Stated Goal: go home OT Goal Formulation: With patient Time For Goal Achievement: 07/23/15 Potential to Achieve Goals: Good ADL Goals Pt Will Perform Lower Body Dressing: with min guard assist;sit to/from stand Pt Will Transfer to Toilet: ambulating;bedside commode;with min assist Pt Will Perform Toileting - Clothing Manipulation and hygiene: with min guard assist;sit to/from stand Additional ADL Goal #1: Pt will perform HEP for Rt UE to increase coordination.  OT Frequency: Min 2X/week   Barriers to D/C:            Co-evaluation              End of Session Equipment Utilized During Treatment: Gait belt;Rolling walker  Activity Tolerance: Patient tolerated treatment well Patient left: Other (comment) (with hospitalist in room)   Time: 4665-9935 OT Time Calculation (min): 17 min Charges:  OT General Charges $OT Visit: 1 Procedure OT Evaluation $OT Eval Moderate Complexity: 1 Procedure G-CodesBenito Mccreedy OTR/L [phone]  07/16/2015, 3:50 PM

## 2015-07-16 NOTE — Progress Notes (Signed)
Inpatient Rehabilitation  Patient was screened by Maveryck Bahri for appropriateness for an Inpatient Acute Rehab consult.  At this time, we are recommending Inpatient Rehab consult.  Please order consult if you are agreeable.  Prentiss Hammett PT Inpatient Rehab Admissions Coordinator Cell [phone] Office [phone]    

## 2015-07-17 ENCOUNTER — Encounter (HOSPITAL_COMMUNITY): Payer: Self-pay

## 2015-07-17 ENCOUNTER — Inpatient Hospital Stay (HOSPITAL_COMMUNITY)
Admission: RE | Admit: 2015-07-17 | Discharge: 2015-07-26 | DRG: 057 | Disposition: A | Discharge status: Home Health | Payer: Medicare Other | Source: Intra-hospital | Attending: Physical Medicine & Rehabilitation | Admitting: Physical Medicine & Rehabilitation

## 2015-07-17 DIAGNOSIS — E1122 Type 2 diabetes mellitus with diabetic chronic kidney disease: Secondary | ICD-10-CM | POA: Diagnosis present

## 2015-07-17 DIAGNOSIS — Z7951 Long term (current) use of inhaled steroids: Secondary | ICD-10-CM

## 2015-07-17 DIAGNOSIS — J449 Chronic obstructive pulmonary disease, unspecified: Secondary | ICD-10-CM | POA: Diagnosis present

## 2015-07-17 DIAGNOSIS — I639 Cerebral infarction, unspecified: Secondary | ICD-10-CM | POA: Diagnosis present

## 2015-07-17 DIAGNOSIS — Z79899 Other long term (current) drug therapy: Secondary | ICD-10-CM | POA: Diagnosis not present

## 2015-07-17 DIAGNOSIS — E785 Hyperlipidemia, unspecified: Secondary | ICD-10-CM | POA: Diagnosis present

## 2015-07-17 DIAGNOSIS — K219 Gastro-esophageal reflux disease without esophagitis: Secondary | ICD-10-CM | POA: Diagnosis present

## 2015-07-17 DIAGNOSIS — Z87891 Personal history of nicotine dependence: Secondary | ICD-10-CM | POA: Diagnosis not present

## 2015-07-17 DIAGNOSIS — Z880 Allergy status to penicillin: Secondary | ICD-10-CM

## 2015-07-17 DIAGNOSIS — I252 Old myocardial infarction: Secondary | ICD-10-CM

## 2015-07-17 DIAGNOSIS — Z95828 Presence of other vascular implants and grafts: Secondary | ICD-10-CM | POA: Diagnosis not present

## 2015-07-17 DIAGNOSIS — F411 Generalized anxiety disorder: Secondary | ICD-10-CM | POA: Insufficient documentation

## 2015-07-17 DIAGNOSIS — R278 Other lack of coordination: Secondary | ICD-10-CM | POA: Diagnosis present

## 2015-07-17 DIAGNOSIS — Z7982 Long term (current) use of aspirin: Secondary | ICD-10-CM | POA: Diagnosis not present

## 2015-07-17 DIAGNOSIS — N189 Chronic kidney disease, unspecified: Secondary | ICD-10-CM | POA: Diagnosis present

## 2015-07-17 DIAGNOSIS — N183 Chronic kidney disease, stage 3 (moderate): Secondary | ICD-10-CM | POA: Diagnosis not present

## 2015-07-17 DIAGNOSIS — I69393 Ataxia following cerebral infarction: Secondary | ICD-10-CM | POA: Diagnosis not present

## 2015-07-17 DIAGNOSIS — I129 Hypertensive chronic kidney disease with stage 1 through stage 4 chronic kidney disease, or unspecified chronic kidney disease: Secondary | ICD-10-CM | POA: Diagnosis present

## 2015-07-17 DIAGNOSIS — F419 Anxiety disorder, unspecified: Secondary | ICD-10-CM | POA: Diagnosis present

## 2015-07-17 DIAGNOSIS — R110 Nausea: Secondary | ICD-10-CM | POA: Insufficient documentation

## 2015-07-17 DIAGNOSIS — I6522 Occlusion and stenosis of left carotid artery: Secondary | ICD-10-CM | POA: Diagnosis present

## 2015-07-17 DIAGNOSIS — E1151 Type 2 diabetes mellitus with diabetic peripheral angiopathy without gangrene: Secondary | ICD-10-CM | POA: Diagnosis present

## 2015-07-17 DIAGNOSIS — I10 Essential (primary) hypertension: Secondary | ICD-10-CM | POA: Insufficient documentation

## 2015-07-17 DIAGNOSIS — K5901 Slow transit constipation: Secondary | ICD-10-CM | POA: Insufficient documentation

## 2015-07-17 DIAGNOSIS — K5900 Constipation, unspecified: Secondary | ICD-10-CM | POA: Diagnosis present

## 2015-07-17 DIAGNOSIS — I2510 Atherosclerotic heart disease of native coronary artery without angina pectoris: Secondary | ICD-10-CM | POA: Diagnosis present

## 2015-07-17 LAB — GLUCOSE, CAPILLARY
GLUCOSE-CAPILLARY: 116 mg/dL — AB (ref 65–99)
GLUCOSE-CAPILLARY: 127 mg/dL — AB (ref 65–99)
Glucose-Capillary: 128 mg/dL — ABNORMAL HIGH (ref 65–99)

## 2015-07-17 LAB — HEMOGLOBIN A1C
HEMOGLOBIN A1C: 6 % — AB (ref 4.8–5.6)
Mean Plasma Glucose: 126 mg/dL

## 2015-07-17 MED ORDER — ALPRAZOLAM 0.25 MG PO TABS: 0.5000 mg | ORAL_TABLET | Freq: Every evening | ORAL | Status: DC | PRN

## 2015-07-17 MED ORDER — SORBITOL 70 % SOLN
30.0000 mL | Freq: Every day | Status: DC | PRN
Start: 2015-07-17 — End: 2015-07-26
  Administered 2015-07-19: 30 mL via ORAL
  Filled 2015-07-17: qty 30

## 2015-07-17 MED ORDER — ATORVASTATIN CALCIUM 20 MG PO TABS
20.0000 mg | ORAL_TABLET | Freq: Every day | ORAL | Status: DC
  Administered 2015-07-21: 20 mg via ORAL
  Filled 2015-07-17 (×7): qty 1

## 2015-07-17 MED ORDER — CLOPIDOGREL BISULFATE 75 MG PO TABS
75.0000 mg | ORAL_TABLET | Freq: Every day | ORAL | Status: DC
  Administered 2015-07-18 – 2015-07-26 (×9): 75 mg via ORAL
  Filled 2015-07-17 (×9): qty 1

## 2015-07-17 MED ORDER — FAMOTIDINE 20 MG PO TABS
20.0000 mg | ORAL_TABLET | Freq: Two times a day (BID) | ORAL | Status: DC
  Administered 2015-07-17 – 2015-07-26 (×18): 20 mg via ORAL
  Filled 2015-07-17 (×18): qty 1

## 2015-07-17 MED ORDER — ONDANSETRON HCL 4 MG/2ML IJ SOLN: 4.0000 mg | Freq: Four times a day (QID) | INTRAMUSCULAR | Status: DC | PRN

## 2015-07-17 MED ORDER — ACETAMINOPHEN 325 MG PO TABS
650.0000 mg | ORAL_TABLET | ORAL | Status: DC | PRN
  Administered 2015-07-19: 650 mg via ORAL
  Filled 2015-07-17 (×2): qty 2

## 2015-07-17 MED ORDER — ONDANSETRON HCL 4 MG PO TABS
4.0000 mg | ORAL_TABLET | Freq: Four times a day (QID) | ORAL | Status: DC | PRN
  Administered 2015-07-19: 4 mg via ORAL
  Filled 2015-07-17: qty 1

## 2015-07-17 MED ORDER — ACETAMINOPHEN 650 MG RE SUPP: 650.0000 mg | RECTAL | Status: DC | PRN

## 2015-07-17 MED ORDER — CLOPIDOGREL BISULFATE 75 MG PO TABS: 75.0000 mg | ORAL_TABLET | Freq: Every day | ORAL | Status: DC

## 2015-07-17 MED ORDER — ASPIRIN EC 325 MG PO TBEC
325.0000 mg | DELAYED_RELEASE_TABLET | Freq: Every day | ORAL | Status: DC
  Administered 2015-07-18 – 2015-07-26 (×9): 325 mg via ORAL
  Filled 2015-07-17 (×9): qty 1

## 2015-07-17 MED ORDER — ATORVASTATIN CALCIUM 20 MG PO TABS: 20.0000 mg | ORAL_TABLET | Freq: Every day | ORAL | Status: DC

## 2015-07-17 MED ORDER — ASPIRIN 300 MG RE SUPP
300.0000 mg | Freq: Every day | RECTAL | Status: DC
  Filled 2015-07-17 (×3): qty 1

## 2015-07-17 MED ORDER — MOMETASONE FURO-FORMOTEROL FUM 100-5 MCG/ACT IN AERO
2.0000 | INHALATION_SPRAY | Freq: Two times a day (BID) | RESPIRATORY_TRACT | Status: DC
  Filled 2015-07-17 (×2): qty 8.8

## 2015-07-17 MED ORDER — METOPROLOL SUCCINATE ER 25 MG PO TB24
25.0000 mg | ORAL_TABLET | Freq: Every day | ORAL | Status: DC
  Administered 2015-07-18 – 2015-07-26 (×9): 25 mg via ORAL
  Filled 2015-07-17 (×9): qty 1

## 2015-07-17 MED ORDER — SENNOSIDES-DOCUSATE SODIUM 8.6-50 MG PO TABS
1.0000 | ORAL_TABLET | Freq: Every evening | ORAL | Status: DC | PRN
  Administered 2015-07-17 – 2015-07-18 (×2): 1 via ORAL
  Filled 2015-07-17 (×2): qty 1

## 2015-07-17 MED ORDER — ASPIRIN 325 MG PO TBEC: 325.0000 mg | DELAYED_RELEASE_TABLET | Freq: Every day | ORAL | Status: DC

## 2015-07-17 NOTE — Discharge Summary (Signed)
Physician Discharge Summary  Martin [name]:[id number] DOB: 02-Apr-1930 DOA: 07/15/2015  PCP: Odette Fraction, MD  Admit date: 07/15/2015 Discharge date: 07/17/2015  Discharge recommendations: Transfer to inpatient rehabilitation for ongoing PT/OT Follow up with Dr. Erlinda Hong, Neurology, in 2 months or sooner as needed CT head on 07/18/2015 Continue plavix through 10/16/2015, then stop (68-monthcourse)  Discharge Diagnoses:  Principal Problem:   CVA (cerebral infarction) Active Problems:   Hypertension   Peripheral vascular disease, unspecified ([place]   Diabetes mellitus, type 2 ([place]   Stroke ([place]   Hypothermia   Cerebellar stroke ([place]   Coronary artery disease involving native coronary artery of native heart without angina pectoris   Chronic obstructive pulmonary disease ([facility]   CKD (chronic kidney disease)   Leukocytosis   Thrombocytopenia ([place]   Cerebrovascular accident (CVA) ([place]   Discharge Condition: Stable, improved  Diet recommendation: Diabetic/healthy heart  Wt Readings from Last 3 Encounters:  07/15/15 66.225 kg (146 lb)  01/04/15 64.864 kg (143 lb)  08/11/14 65.318 kg (144 lb)    History of present illness:   The patient is an 82year old male with history of coronary artery disease status post CABG, COPD, diabetes mellitus type 2, and peripheral vascular disease.  Around 10:00 the night of admission, the patient noticed that both arms and both legs were cold and he had some mild nausea. Around 2 AM he noticed that his vision was blurred and he was unable to roll over in bed. After about 30 minutes patient was able to get out of bed and phone for help. Patient tried to walk but his gait was very unsteady. EMS transported him to the ER where he was found to be hypothermic. In the ER, he was found to have a moderate to large acute mid to inferior right cerebellar infarct.   Hospital Course:   CVA (cerebral infarction).  MRI/MRA brain demonstrated a moderately  large acute mid to inferior right cerebellar infarct with possibly a tiny amount of hemorrhage along the periphery.  The right vertebral artery and right posterior inferior cerebellar artery were occluded.  Due to the presence of large vessel intracranial stenosis which was likely responsible for the acute infarct that brought the patient to the hospital, he was started on combination aspirin and Plavix to continue for 3 months.  He should take aspirin and plavix through 10/16/2015.  He should stop his Plavix on 10/16/2015.  He should continue to take aspirin 325 mg starting on 10/17/2015 as monotherapy. Echocardiogram demonstrated no evidence of PFO or ASD or intracardiac thrombus. His carotid duplex confirms some mild internal carotid artery stenosis, right 1-39 percent and the left 40-59 percent stenosis. He had antegrade vertebral artery flow.  He remained in normal sinus rhythm. His LDL was 101 and is pravastatin was therefore changed to atorvastatin. His hemoglobin A1c was pending at the time of discharge. Due to the concern for hydrocephalus given his cerebellar stroke, he is going to undergo a repeat head CT on 07/18/2015 at the recommendation of neurology. He work with physical and occupational therapy recommended inpatient rehabilitation. He was assessed by the physical medicine and rehabilitation physician and has been accepted for transfer for ongoing therapy. At the time of discharge, the patient has had some improvement in his right arm and right leg dysmetria and his diplopia has improved. He continues to have some subtle symptoms.   Hypothermia, etiology unclear.  UA negative.  He had no respiratory symptoms. This may have been related to exposure  or stroke.   Hypertension. His blood pressure medications were held for permissive hypertension. His blood pressure was initially normal, but gradually rose. His medications may be resumed to gradually reduce his blood pressure within normal range.  Continue  Cardia, Cozaar, metoprolol.  CKD stage 3. Cr 1.4 which is baseline   Carotid artery stenosis: Right 1-39 percent ICA stenosis, left 40-59 percent ICA stenosis.  Follow-up imaging in 6-12 months.    PVD, s/p aortobifemoral bypass graft and right femoral popliteal bypass grafting in May of 2012.   Diabetes mellitus, type 2 ([place]). Not on medication, managing with diet. Follow-up hemoglobin A1c. He was given sliding scale insulin to control his blood sugars.  COPD, stable, dulera substituted for symbicort per formulary. No respiratory symptoms.   GERD, stable, continued home H2 blocker  Hyperlipidemia, LDL not at goal of < 70. Pravastatin changed to atorvastatin  Insomnia, continued Xanax at bedtime as needed.    Consultants:   Neurology, Dr. Erlinda Hong  Procedures:  ECHO: LVEF 55-60%, moderate LVH, diastolic dysfunction, elevated LV  filling pressure, normal LA size, trivial MR, normal IVC. Carotid duplex: Right 1-39 percent ICA stenosis, left 40-59 percent ICA stenosis. Antegrade vertebral artery flow  Antimicrobials:   none  Discharge Exam: Filed Vitals:   07/17/15 0946 07/17/15 1414  BP: 177/81 150/69  Pulse: 86 71  Temp: 98 F (36.7 C) 97.8 F (36.6 C)  Resp: 20 20   Filed Vitals:   07/17/15 0201 07/17/15 0524 07/17/15 0946 07/17/15 1414  BP: 164/85 183/81 177/81 150/69  Pulse: 69 82 86 71  Temp: 98.6 F (37 C) 98.4 F (36.9 C) 98 F (36.7 C) 97.8 F (36.6 C)  TempSrc: Oral Oral Oral Oral  Resp: '18 18 20 20  '$ Height:      Weight:      SpO2: 96% 96% 100% 99%   General exam: Adult male. No acute distress. Sitting in chair HEENT: NCAT, MMM Respiratory system: Clear to auscultation. Respiratory effort normal. Cardiovascular system: S1 & S2 heard, RRR. No JVD, murmurs, rubs, gallops or clicks. Warm extremities Gastrointestinal system: Abdomen is nondistended, soft and nontender. Normal bowel sounds heard. Skin: No rashes, lesions or ulcers MSK: Normal  tone and bulk, no lower extremity edema Neuro: CN II-XII grossly intact, EOMI and no obvious nystagmus. PERRL, 5/5 strength throughout, SITLT throughout. Dysmetria of the right arm and right leg.  Psychiatry: Judgement and insight appear normal. Mood & affect appropriate.   Discharge Instructions      Discharge Instructions    Call MD for:  difficulty breathing, headache or visual disturbances    Complete by:  As directed      Call MD for:  extreme fatigue    Complete by:  As directed      Call MD for:  hives    Complete by:  As directed      Call MD for:  persistant dizziness or light-headedness    Complete by:  As directed      Call MD for:  persistant nausea and vomiting    Complete by:  As directed      Call MD for:  severe uncontrolled pain    Complete by:  As directed      Call MD for:  temperature >100.4    Complete by:  As directed      Diet - low sodium heart healthy    Complete by:  As directed      Driving Restrictions    Complete  by:  As directed   No driving or operating heavy machinery     Increase activity slowly    Complete by:  As directed             Medication List    STOP taking these medications        aspirin 81 MG tablet  Replaced by:  aspirin 325 MG EC tablet     pravastatin 80 MG tablet  Commonly known as:  PRAVACHOL      TAKE these medications        ALPRAZolam 0.5 MG tablet  Commonly known as:  XANAX  TAKE ONE TABLET BY MOUTH AT BEDTIME AS NEEDED FOR ANXIETY OR INSOMNIA.     aspirin 325 MG EC tablet  Take 1 tablet (325 mg total) by mouth daily.     atorvastatin 20 MG tablet  Commonly known as:  LIPITOR  Take 1 tablet (20 mg total) by mouth daily at 6 PM.     budesonide-formoterol 160-4.5 MCG/ACT inhaler  Commonly known as:  SYMBICORT  INHALE ONE PUFF BY MOUTH TWICE DAILY     CARTIA XT 120 MG 24 hr capsule  Generic drug:  diltiazem  TAKE ONE CAPSULE BY MOUTH ONCE DAILY     clopidogrel 75 MG tablet  Commonly known as:   PLAVIX  Take 1 tablet (75 mg total) by mouth daily.     famotidine 20 MG tablet  Commonly known as:  PEPCID  TAKE ONE TABLET BY MOUTH TWICE DAILY     losartan 50 MG tablet  Commonly known as:  COZAAR  TAKE ONE TABLET BY MOUTH ONCE DAILY     metoprolol succinate 25 MG 24 hr tablet  Commonly known as:  TOPROL-XL  TAKE ONE TABLET BY MOUTH ONCE DAILY.     nitroGLYCERIN 0.4 MG SL tablet  Commonly known as:  NITROSTAT  Place 1 tablet (0.4 mg total) under the tongue as directed.       Follow-up Information    Follow up with [facility] TOM, MD. Schedule an appointment as soon as possible for a visit in 1 month.   Specialty:  Family Medicine   Contact information:   [address] [place] [address] [phone]       Follow up with Xu,Jindong, MD. Schedule an appointment as soon as possible for a visit in 1 month.   Specialty:  Neurology   Contact information:   [address] [place] [address] [phone]        The results of significant diagnostics from this hospitalization (including imaging, microbiology, ancillary and laboratory) are listed below for reference.    Significant Diagnostic Studies: Ct Head Wo Contrast  07/16/2015  CLINICAL DATA:  Follow-up examination for stroke. EXAM: CT HEAD WITHOUT CONTRAST TECHNIQUE: Contiguous axial images were obtained from the base of the skull through the vertex without intravenous contrast. COMPARISON:  Prior MRI from 07/15/2015. FINDINGS: Well demarcated hypodensity involving the right cerebellar hemisphere consistent with acute ischemic infarcts. Overall size and distribution stable from previous MRI. No hemorrhagic transformation by CT. There is localized mass effect with partial effacement of the outflow tract of the fourth ventricle. Fourth ventricle still partially effaced as well. No hydrocephalus. No evidence for ventricular trapping. Basilar cisterns remain patent. No herniation through the  foramen magnum. Atrophy with chronic microvascular ischemic disease noted. Remote lacunar infarct within the right thalamus. Additional small remote lacunar infarct within the left basal ganglia. Intracranial atherosclerosis again  noted. No other acute large vessel territory infarct. No intracranial hemorrhage. No mass lesion. No extra-axial fluid collection. Scalp soft tissues within normal limits. No acute abnormality about the orbits. Patient is status post cataract extraction. Paranasal sinuses are clear.  No mastoid effusion. Calvarium intact. IMPRESSION: 1. Evolving acute ischemic right cerebellar infarct, stable in size and distribution relative to prior MRI. No CT evidence for hemorrhagic transformation. Mild localized edema with mass effect on the fourth ventricular outflow tract. Fourth ventricle itself remains patent. No hydrocephalus at this time. 2. No other new acute intracranial process. Electronically Signed   By: Jeannine Boga M.D.   On: 07/16/2015 06:52   Mr Angiogram Head Wo Contrast  07/15/2015  CLINICAL DATA:  82 year old hypertensive male with diplopia and ataxia. Initial encounter. EXAM: MRI HEAD WITHOUT CONTRAST MRA HEAD WITHOUT CONTRAST TECHNIQUE: Multiplanar, multiecho pulse sequences of the brain and surrounding structures were obtained without intravenous contrast. Angiographic images of the head were obtained using MRA technique without contrast. COMPARISON:  None. FINDINGS: MRI HEAD FINDINGS Moderately large acute mid to inferior right cerebellar infarct. There may be a tiny amount hemorrhage along the periphery. Abnormal appearance of the right vertebral artery. Remote right thalamic and left basal ganglia small infarcts. Mild chronic microvascular changes. Mild global atrophy without hydrocephalus. No intracranial mass lesion noted on this unenhanced exam. Post lens replacement without acute orbital abnormality noted. Minimal partial opacification mastoid air cells. Minimal  mucosal thickening ethmoid sinus air cells. Prominent transverse ligament hypertrophy. Mild contact with the ventral aspect of the cervical medullary junction. Artifact limits evaluation of C4 and C5. Currently, no cerebellar tonsil herniation or hydrocephalus. If infarct swells, patient may be at risk for development of hydrocephalus or downward herniation of the cerebellar tonsils. Partially empty expanded sella. MRA HEAD FINDINGS Right vertebral artery and right posterior inferior cerebellar artery are occluded. Mild narrowing distal left vertebral artery proximal to the takeoff of the left posterior inferior cerebellar artery. Mild to moderate narrowing portions of the left posterior inferior cerebellar artery. Ectatic basilar artery with slight irregularity without high-grade stenosis. Nonvisualized left anterior inferior cerebellar artery with small caliber right anterior inferior cerebellar artery. Mild irregularity proximal right posterior cerebral artery. Moderate narrowing mid and distal aspect left posterior cerebral artery. Distal right posterior cerebral artery branch vessel narrowing. Moderate to marked narrowing right internal carotid artery cavernous segment with regions of adjacent ectasia. Moderate narrowing right internal carotid artery supraclinoid segment. Ectatic irregular left internal carotid artery cavernous segment. 2 mm bulge posterior aspect left internal carotid artery pre cavernous segment may represent tiny aneurysm or result of atherosclerotic changes. Mild narrowing left internal carotid artery supraclinoid segment Moderate narrowing M1 segment left middle cerebral artery. Mild irregularity M1 segment right middle cerebral artery without high-grade stenosis. Middle cerebral artery branches with areas of ectasia and moderate narrowing. Ectatic anterior cerebral arteries with moderate to marked narrowing portions of the A2 segment greater on the left. IMPRESSION: MRI HEAD Moderately  large acute mid to inferior right cerebellar infarct. There may be a tiny amount hemorrhage along the periphery. Remote right thalamic and left basal ganglia small infarcts. Mild chronic microvascular changes. Mild global atrophy without hydrocephalus. Prominent transverse ligament hypertrophy. Mild contact with the ventral aspect of the cervical medullary junction. MRA HEAD Right vertebral artery and right posterior inferior cerebellar artery are occluded. Mild narrowing distal left vertebral artery proximal to the takeoff of the left posterior inferior cerebellar artery. Mild to moderate narrowing portions of the left posterior inferior  cerebellar artery. Ectatic basilar artery with slight irregularity without high-grade stenosis. Nonvisualized left anterior inferior cerebellar artery with small caliber right anterior inferior cerebellar artery. Mild irregularity proximal right posterior cerebral artery. Moderate narrowing mid and distal aspect left posterior cerebral artery. Distal right posterior cerebral artery branch vessel narrowing. Moderate to marked narrowing right internal carotid artery cavernous segment with regions of adjacent ectasia. Moderate narrowing right internal carotid artery supraclinoid segment. Ectatic irregular left internal carotid artery cavernous segment. 2 mm bulge posterior aspect left internal carotid artery pre cavernous segment may represent tiny aneurysm or result of atherosclerotic changes. Mild narrowing left internal carotid artery supraclinoid segment Moderate narrowing M1 segment left middle cerebral artery. Mild irregularity M1 segment right middle cerebral artery without high-grade stenosis. Middle cerebral artery branches with areas of ectasia and moderate narrowing. Ectatic anterior cerebral arteries with moderate to marked narrowing portions of the A2 segment greater on the left. These results were called by telephone at the time of interpretation on 07/15/2015 at 8:28 am  to Dr. Lennette Bihari CAMPOS ; [place] who verbally acknowledged these results. Electronically Signed   By: Genia Del M.D.   On: 07/15/2015 08:59   Mr Angiogram Neck W Wo Contrast  07/15/2015  CLINICAL DATA:  82 year old hypertensive male with acute right cerebellar infarct. Subsequent encounter. EXAM: MRA NECK WITHOUT AND WITH CONTRAST TECHNIQUE: Multiplanar and multiecho pulse sequences of the neck were obtained without and with intravenous contrast. Angiographic images of the neck were obtained using MRA technique without and with intravenous contrast. CONTRAST:  55m MULTIHANCE GADOBENATE DIMEGLUMINE 529 MG/ML IV SOLN COMPARISON:  MR brain and MR angiogram 07/15/2015. FINDINGS: Exam is limited. There appears to be flow within portions of the vertebral arteries bilaterally with right vertebral artery appearing to be occluded at the C2 level. Suggestion of mild to slightly moderate narrowing proximal left vertebral artery. Bilateral carotid bifurcation narrowing suspected and more notable on the right but not able to adequately grade stenosis on the current exam. IMPRESSION: Exam is limited. There appears to be flow within portions of the vertebral arteries bilaterally with right vertebral artery appearing to be occluded at the C2 level. Suggestion of mild to slightly moderate narrowing proximal left vertebral artery. Bilateral carotid bifurcation narrowing suspected and more notable on the right but not able to adequately grade stenosis on the current exam. Electronically Signed   By: SGenia DelM.D.   On: 07/15/2015 11:17   Mr Brain Wo Contrast  07/15/2015  CLINICAL DATA:  82year old hypertensive male with diplopia and ataxia. Initial encounter. EXAM: MRI HEAD WITHOUT CONTRAST MRA HEAD WITHOUT CONTRAST TECHNIQUE: Multiplanar, multiecho pulse sequences of the brain and surrounding structures were obtained without intravenous contrast. Angiographic images of the head were obtained using MRA  technique without contrast. COMPARISON:  None. FINDINGS: MRI HEAD FINDINGS Moderately large acute mid to inferior right cerebellar infarct. There may be a tiny amount hemorrhage along the periphery. Abnormal appearance of the right vertebral artery. Remote right thalamic and left basal ganglia small infarcts. Mild chronic microvascular changes. Mild global atrophy without hydrocephalus. No intracranial mass lesion noted on this unenhanced exam. Post lens replacement without acute orbital abnormality noted. Minimal partial opacification mastoid air cells. Minimal mucosal thickening ethmoid sinus air cells. Prominent transverse ligament hypertrophy. Mild contact with the ventral aspect of the cervical medullary junction. Artifact limits evaluation of C4 and C5. Currently, no cerebellar tonsil herniation or hydrocephalus. If infarct swells, patient may be at risk for development of hydrocephalus or  downward herniation of the cerebellar tonsils. Partially empty expanded sella. MRA HEAD FINDINGS Right vertebral artery and right posterior inferior cerebellar artery are occluded. Mild narrowing distal left vertebral artery proximal to the takeoff of the left posterior inferior cerebellar artery. Mild to moderate narrowing portions of the left posterior inferior cerebellar artery. Ectatic basilar artery with slight irregularity without high-grade stenosis. Nonvisualized left anterior inferior cerebellar artery with small caliber right anterior inferior cerebellar artery. Mild irregularity proximal right posterior cerebral artery. Moderate narrowing mid and distal aspect left posterior cerebral artery. Distal right posterior cerebral artery branch vessel narrowing. Moderate to marked narrowing right internal carotid artery cavernous segment with regions of adjacent ectasia. Moderate narrowing right internal carotid artery supraclinoid segment. Ectatic irregular left internal carotid artery cavernous segment. 2 mm bulge  posterior aspect left internal carotid artery pre cavernous segment may represent tiny aneurysm or result of atherosclerotic changes. Mild narrowing left internal carotid artery supraclinoid segment Moderate narrowing M1 segment left middle cerebral artery. Mild irregularity M1 segment right middle cerebral artery without high-grade stenosis. Middle cerebral artery branches with areas of ectasia and moderate narrowing. Ectatic anterior cerebral arteries with moderate to marked narrowing portions of the A2 segment greater on the left. IMPRESSION: MRI HEAD Moderately large acute mid to inferior right cerebellar infarct. There may be a tiny amount hemorrhage along the periphery. Remote right thalamic and left basal ganglia small infarcts. Mild chronic microvascular changes. Mild global atrophy without hydrocephalus. Prominent transverse ligament hypertrophy. Mild contact with the ventral aspect of the cervical medullary junction. MRA HEAD Right vertebral artery and right posterior inferior cerebellar artery are occluded. Mild narrowing distal left vertebral artery proximal to the takeoff of the left posterior inferior cerebellar artery. Mild to moderate narrowing portions of the left posterior inferior cerebellar artery. Ectatic basilar artery with slight irregularity without high-grade stenosis. Nonvisualized left anterior inferior cerebellar artery with small caliber right anterior inferior cerebellar artery. Mild irregularity proximal right posterior cerebral artery. Moderate narrowing mid and distal aspect left posterior cerebral artery. Distal right posterior cerebral artery branch vessel narrowing. Moderate to marked narrowing right internal carotid artery cavernous segment with regions of adjacent ectasia. Moderate narrowing right internal carotid artery supraclinoid segment. Ectatic irregular left internal carotid artery cavernous segment. 2 mm bulge posterior aspect left internal carotid artery pre cavernous  segment may represent tiny aneurysm or result of atherosclerotic changes. Mild narrowing left internal carotid artery supraclinoid segment Moderate narrowing M1 segment left middle cerebral artery. Mild irregularity M1 segment right middle cerebral artery without high-grade stenosis. Middle cerebral artery branches with areas of ectasia and moderate narrowing. Ectatic anterior cerebral arteries with moderate to marked narrowing portions of the A2 segment greater on the left. These results were called by telephone at the time of interpretation on 07/15/2015 at 8:28 am to Dr. Lennette Bihari CAMPOS ; [place] who verbally acknowledged these results. Electronically Signed   By: Genia Del M.D.   On: 07/15/2015 08:59    Microbiology: No results found for this or any previous visit (from the past 240 hour(s)).   Labs: Basic Metabolic Panel:  Recent Labs Lab 07/15/15 0545 07/15/15 0604  NA 136 138  K 3.5 3.5  CL 103 101  CO2 22  --   GLUCOSE 229* 217*  BUN 33* 33*  CREATININE 1.51* 1.40*  CALCIUM 9.4  --    Liver Function Tests: No results for input(s): AST, ALT, ALKPHOS, BILITOT, PROT, ALBUMIN in the last 168 hours. No results for input(s): LIPASE,  AMYLASE in the last 168 hours. No results for input(s): AMMONIA in the last 168 hours. CBC:  Recent Labs Lab 07/15/15 0545 07/15/15 0604  WBC 12.4*  --   NEUTROABS 10.2*  --   HGB 13.6 15.6  HCT 42.2 46.0  MCV 98.4  --   PLT 133*  --    Cardiac Enzymes: No results for input(s): CKTOTAL, CKMB, CKMBINDEX, TROPONINI in the last 168 hours. BNP: BNP (last 3 results) No results for input(s): BNP in the last 8760 hours.  ProBNP (last 3 results) No results for input(s): PROBNP in the last 8760 hours.  CBG:  Recent Labs Lab 07/16/15 1433 07/16/15 1631 07/16/15 2205 07/17/15 0618 07/17/15 1156  GLUCAP 137* 139* 114* 116* 128*    Time coordinating discharge: 35 minutes  Signed:  Yanna Leaks  [facility] 07/17/2015, 3:04 PM

## 2015-07-17 NOTE — PMR Pre-admission (Signed)
PMR Admission Coordinator Pre-Admission Assessment  Patient: Martin [name] is an 82 y.o., male MRN: [id number] DOB: Jul 23, 1930 Height: '5\' 6"'$  (167.6 cm) Weight: 66.225 kg (146 lb)              Insurance Information HMO:     PPO:      PCP:      IPA:      80/20: yes     OTHER: no HMO PRIMARY: Medicare a and b      Policy#: [id number]      Subscriber: pt Benefits:  Phone #: Passport one     Name: 07/17/15 Eff. Date: 09/07/97     Deduct: $1316      Out of Pocket Max: none      Life Max: none CIR: 100%      SNF: 20 full days Outpatient: 80%     Co-Pay: 20 % Home Health: 100%      Co-Pay: none DME: 80%     Co-Pay: 20% Providers: pt choice  SECONDARY: [employer] medicare supplement      Policy#: [id number]      Subscriber: pt  Medicaid Application Date:       Case Manager:  Disability Application Date:       Case Worker:   Emergency Contact Information Contact Information    Name Relation Home Work Mobile   Price,Denise Grandaughter [phone]     Rickard Patience   [phone]     Current Medical History  Patient Admitting Diagnosis: right cerebellar CVA  History of Present Illness: CARSYN TAUBMAN is a 82 y.o. right handed male coronary artery disease with remote MI, hypertension, COPD with remote tobacco abuse, diabetes mellitus. Independent prior to admission living alone.  Admitted 07/15/2015 with double vision and gait disturbance. MRI of the brain shows moderately large acute mid to inferior right cerebellar infarct. Remote right thalamic and left basal ganglia small infarcts. 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 Patient did not receive TPA. Carotid Dopplers with left 40-59% ICA stenosis. EchocardiogramWith ejection fraction of 73% grade 1 diastolic dysfunction . Neurology consulted presently maintained on aspirin and Plavix for CVA prophylaxis 3 months then Plavix alone. Repeat CT scan showed focal mass effect but no  hydrocephalus. Plan a follow-up CT 07/18/2015. Tolerating a regular diet.   NIH Total: 2    Past Medical History  Past Medical History  Diagnosis Date  . Hypertension   . Hyperlipidemia   . Myocardial infarction [facility])     1978 & 2011  . COPD (chronic obstructive pulmonary disease) ([place])   . Peripheral vascular disease, unspecified ([place])   . GERD (gastroesophageal reflux disease)   . Arthritis   . H/O: GI bleed   . Hiatal hernia   . Diabetes mellitus   . Bronchitis   . Renal artery stenosis ([facility])     Family History  family history includes Cancer in his father.  Prior Rehab/Hospitalizations:  Has the patient had major surgery during 100 days prior to admission? No  Current Medications   Current facility-administered medications:  .   stroke: mapping our early stages of recovery book, , Does not apply, Once, Willia Craze, NP .  0.9 %  sodium chloride infusion, , Intravenous, Continuous, Willia Craze, NP, Last Rate: 50 mL/hr at 07/16/15 2032, 1,000 mL at 07/16/15 2032 .  acetaminophen (TYLENOL) tablet 650 mg, 650 mg, Oral, Q4H PRN, 650 mg at 07/17/15 1016 **OR** acetaminophen (TYLENOL)  suppository 650 mg, 650 mg, Rectal, Q4H PRN, Willia Craze, NP .  ALPRAZolam Duanne Moron) tablet 0.5 mg, 0.5 mg, Oral, QHS PRN, Willia Craze, NP .  aspirin EC tablet 325 mg, 325 mg, Oral, Daily, 325 mg at 07/17/15 1010 **OR** aspirin suppository 300 mg, 300 mg, Rectal, Daily, Greta Doom, MD .  atorvastatin (LIPITOR) tablet 20 mg, 20 mg, Oral, q1800, Janece Canterbury, MD, 20 mg at 07/16/15 1722 .  clopidogrel (PLAVIX) tablet 75 mg, 75 mg, Oral, Daily, Donzetta Starch, NP, 75 mg at 07/17/15 1011 .  famotidine (PEPCID) tablet 20 mg, 20 mg, Oral, BID, Willia Craze, NP, 20 mg at 07/17/15 1011 .  metoprolol succinate (TOPROL-XL) 24 hr tablet 25 mg, 25 mg, Oral, Daily, Janece Canterbury, MD, 25 mg at 07/17/15 1011 .  mometasone-formoterol (DULERA) 100-5 MCG/ACT inhaler 2 puff, 2  puff, Inhalation, BID, Janece Canterbury, MD, 2 puff at 07/16/15 1230 .  senna-docusate (Senokot-S) tablet 1 tablet, 1 tablet, Oral, QHS PRN, Willia Craze, NP  Patients Current Diet: Diet Heart Room service appropriate?: Yes; Fluid consistency:: Thin  Precautions / Restrictions Precautions Precautions: Fall Precaution Comments: ataxia Restrictions Weight Bearing Restrictions: No   Has the patient had 2 or more falls or a fall with injury in the past year?No  Prior Activity Level Limited Community (1-2x/wk): Independent and driving pta. Goes out into community about 1 or two times per week  Development worker, international aid / [place] Devices/Equipment: None Home Equipment: Shower seat - built in, Grab bars - tub/shower  Prior Device Use: Indicate devices/aids used by the patient prior to current illness, exacerbation or injury? None of the above  Prior Functional Level Prior Function Level of Independence: Independent ADL's / Homemaking Assistance Needed: assist with yardwork Comments: Cooks, cleans, drives. Likes to play golf  Self Care: Did the patient need help bathing, dressing, using the toilet or eating?  Independent  Indoor Mobility: Did the patient need assistance with walking from room to room (with or without device)? Independent  Stairs: Did the patient need assistance with internal or external stairs (with or without device)? Independent  Functional Cognition: Did the patient need help planning regular tasks such as shopping or remembering to take medications? Independent  Current Functional Level Cognition  Overall Cognitive Status: Within Functional Limits for tasks assessed Orientation Level: Oriented X4    Extremity Assessment (includes Sensation/Coordination)  Upper Extremity Assessment: RUE deficits/detail (decreased AROM bilateral shoulder flexion-reports arthritis) RUE Deficits / Details: Incoordination noted during FTN testing with  overshooting, undershooting target. RUE Sensation:  [facility].) RUE Coordination: decreased gross motor (pt reports that fine motor coordination is more effortful)  Lower Extremity Assessment: Defer to PT evaluation RLE Deficits / Details: Incoordination and ataxia noted in RLE during functional mobility. RLE Coordination: decreased gross motor, decreased fine motor    ADLs  Overall ADL's : Needs assistance/impaired Lower Body Dressing: Minimal assistance, Sit to/from stand Toilet Transfer: Moderate assistance, Ambulation, RW (sit to stand from chair and bed) Toileting- Clothing Manipulation and Hygiene: Minimal assistance, Sit to/from stand Toileting - Clothing Manipulation Details (indicate cue type and reason): assist given for balance as pt was trying to manage urinal Functional mobility during ADLs: Moderate assistance, Rolling walker    Mobility  Overal bed mobility: Needs Assistance Bed Mobility: Supine to Sit Supine to sit: Supervision Sit to supine: Modified independent (Device/Increase time), HOB elevated General bed mobility comments: Up in chair.    Transfers  Overall transfer level: Needs assistance  Equipment used: None Transfers: Sit to/from [employer] to Stand: [employer] transfer comment: Min guard to boost from chair x3 with cues for hand placement and cues for WB through BLEs equally to stand. Pt pulling up on rail in hallway at times. Truncal ataxia in standing with sway noted.    Ambulation / Gait / Stairs / Wheelchair Mobility  Ambulation/Gait Ambulation/Gait assistance: Mod assist Ambulation Distance (Feet): 100 Feet (x3 bouts) Assistive device: 1 person hand held assist (rail and HHA) Gait Pattern/deviations: Ataxic, Step-through pattern, Narrow base of support, Scissoring General Gait Details: Ataxic gait pattern with right lateral lean and scissoring gait pattern. Cues to widen BoS and for left lateral lean. 3 longer seated rest breaks due to pain RLE from  prior stenting.  Gait velocity: decreased    Posture / Balance Dynamic Sitting Balance Sitting balance - Comments: Assist to donn socks due to dizziness . Balance Overall balance assessment: Needs assistance Sitting-balance support: Feet supported, No upper extremity supported Sitting balance-Leahy Scale: Fair Sitting balance - Comments: Assist to donn socks due to dizziness . Standing balance support: During functional activity Standing balance-Leahy Scale: Fair Standing balance comment: [name] to stand unsupported with Min gaurd assist holding rail for support.     Special needs/care consideration  Bowel mgmt: continent Bladder mgmt:continent Diabetic mgmt yes   Previous Home Environment Living Arrangements: Alone  Lives With: Alone Available Help at Discharge: Family (sister states pt can come stay with her at d/c per pt) Type of Home: House Home Layout: Two level, [name] to live on main level with bedroom/bathroom Alternate Level Stairs-Rails: Right Alternate Level Stairs-Number of Steps: 1 flight Home Access: Stairs to enter Entrance Stairs-Rails: Right Entrance Stairs-Number of Steps: 4 Bathroom Shower/Tub: Multimedia programmer: Standard Bathroom Accessibility: Yes How Accessible: Accessible via walker [place]: No  Discharge Living Setting Plans for Discharge Living Setting: Patient's home, Alone Type of Home at Discharge: House Discharge Home Layout: Two level, [name] to live on main level with bedroom/bathroom (only goes upstairs to change his clothes) Alternate Level Stairs-Rails: Right Alternate Level Stairs-Number of Steps: flight Discharge Home Access: Stairs to enter Entrance Stairs-Rails: Right Entrance Stairs-Number of Steps: 4 Discharge Bathroom Shower/Tub: Walk-in shower (with grab bars and built in seat) Discharge Bathroom Toilet: Standard Discharge [place] Accessibility: Yes How Accessible: Accessible via walker Does the patient have  any problems obtaining your medications?: No  Social/Family/Support Systems Patient Roles:  ( no children, has step granddaughter, Langley Gauss) Sport and exercise psychologist Information: Parke Simmers, sister and grand daughter, Parke Simmers Anticipated Caregiver: Parke Simmers can provide supervision if needed in her home per pt Anticipated Caregiver's Contact Information: see above Ability/Limitations of Caregiver: Parke Simmers is 82 yo and very active Caregiver Availability: 24/7 Discharge Plan Discussed with Primary Caregiver: Yes Is Caregiver In Agreement with Plan?: No Does Caregiver/Family have Issues with Lodging/Transportation while Pt is in Rehab?: No Pt states his sister has offered for him to come stay with her in [facility] for short period if needed. She is very independent 82 yo and she goes to see their brother who is 61 yo in [place] nearly every day  Goals/Additional Needs Patient/Family Goal for Rehab: Mod I to supervision with PT and OT Expected length of stay: [facility]OS 7-12 days Pt/Family Agrees to Admission and willing to participate: Yes Program Orientation Provided & Reviewed with Pt/Caregiver Including Roles  & Responsibilities: Yes   Decrease burden of Care through IP rehab admission: n/a  Possible need for SNF placement upon discharge:not anticipated  Patient Condition: This patient's condition remains as documented in the consult dated 07/17/2015, in which the Rehabilitation Physician determined and documented that the patient's condition is appropriate for intensive rehabilitative care in an inpatient rehabilitation facility. Will admit to inpatient rehab today.  Preadmission Screen Completed By:  Cleatrice Burke, 07/17/2015 1:46 PM ______________________________________________________________________   Discussed status with Dr. Naaman Plummer on 07/17/2015 at 1400 and received telephone approval for admission today.  Admission Coordinator:  Cleatrice Burke, time 1400 Date 07/17/2015

## 2015-07-17 NOTE — Progress Notes (Signed)
Patient sister, Parke Simmers called to check in on patient status.  Sister worried about brother and  wanted to make sure staff knew patient had carpal tunnel surgery approximately 6 weeks ago. Questions answered and support given by RN.  Will continue to monitor patient.

## 2015-07-17 NOTE — H&P (Signed)
Physical Medicine and Rehabilitation Admission H&P    Chief Complaint  Patient presents with  . Weakness  . Dizziness  : HPI: Martin [name] is a 82 y.o. right handed male coronary artery disease with remote MI, hypertension, COPD with remote tobacco abuse, diabetes mellitus. Independent prior to admission living alone. 2 level home with bedroom upstairs. He has a brother who lives across the street but cannot provide assistance. Admitted 07/15/2015 with double vision and gait disturbance. MRI of the brain shows moderately large acute mid to inferior right cerebellar infarct. Remote right thalamic and left basal ganglia small infarcts. 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 Patient did not receive TPA. Carotid Dopplers with left 40-59% ICA stenosis. EchocardiogramWith ejection fraction of 97% grade 1 diastolic dysfunction . Neurology consulted presently maintained on aspirin and Plavix for CVA prophylaxis 3 months then Plavix alone. Repeat CT scan showed focal mass effect but no hydrocephalus. Plan a follow-up CT 07/18/2015. Tolerating a regular diet. PhysicalAnd occupational therapy evaluations completed 07/16/2015 with recommendations of physical medicine rehabilitation consult.Patient was admitted for a comprehensive rehabilitation program  ROS Constitutional: Negative for fever and chills.  HENT: Negative for hearing loss.  Eyes: Positive for double vision.  Respiratory: Positive for cough.   Shortness of breath with heavy exertion  Gastrointestinal: Positive for constipation. Negative for nausea.   GERD  Genitourinary: Negative for dysuria and hematuria.  Musculoskeletal: Positive for joint pain.  Skin: Negative for rash.  Neurological: Positive for dizziness. Negative for seizures, weakness and headaches.  All other systems reviewed and are negative   Past Medical History  Diagnosis Date  . Hypertension   .  Hyperlipidemia   . Myocardial infarction [facility])     1978 & 2011  . COPD (chronic obstructive pulmonary disease) ([place])   . Peripheral vascular disease, unspecified ([place])   . GERD (gastroesophageal reflux disease)   . Arthritis   . H/O: GI bleed   . Hiatal hernia   . Diabetes mellitus   . Bronchitis   . Renal artery stenosis [facility])    Past Surgical History  Procedure Laterality Date  . Aortobifemoral bpg  07/22/10    and Right femoral-popliteal BPG   Family History  Problem Relation Age of Onset  . Cancer Father     LIVER CANCER   Social History:  reports that he quit smoking about 6 years ago. His smoking use included Cigarettes. He quit after 65 years of use. He has never used smokeless tobacco. He reports that he does not drink alcohol or use illicit drugs. Allergies:  Allergies  Allergen Reactions  . Penicillins Rash   Medications Prior to Admission  Medication Sig Dispense Refill  . ALPRAZolam (XANAX) 0.5 MG tablet TAKE ONE TABLET BY MOUTH AT BEDTIME AS NEEDED FOR ANXIETY OR INSOMNIA. 30 tablet 2  . aspirin 81 MG tablet Take 81 mg by mouth daily.      Marland Kitchen CARTIA XT 120 MG 24 hr capsule TAKE ONE CAPSULE BY MOUTH ONCE DAILY 30 capsule 0  . famotidine (PEPCID) 20 MG tablet TAKE ONE TABLET BY MOUTH TWICE DAILY 60 tablet 5  . losartan (COZAAR) 50 MG tablet TAKE ONE TABLET BY MOUTH ONCE DAILY 30 tablet 3  . metoprolol succinate (TOPROL-XL) 25 MG 24 hr tablet TAKE ONE TABLET BY MOUTH ONCE DAILY. 30 tablet 11  . nitroGLYCERIN (NITROSTAT) 0.4 MG SL tablet Place 1 tablet (0.4 mg total) under the tongue as directed.  25 tablet 1  . budesonide-formoterol (SYMBICORT) 160-4.5 MCG/ACT inhaler INHALE ONE PUFF BY MOUTH TWICE DAILY (Patient not taking: Reported on 01/04/2015) 11 g 6  . pravastatin (PRAVACHOL) 80 MG tablet Take 1 tablet (80 mg total) by mouth daily. (Patient not taking: Reported on 01/04/2015) 90 tablet 3    Home: [place] expects to be discharged to::  Private residence Living Arrangements: Alone Available Help at Discharge: Family (sister states pt can come stay with her at d/c per pt) Type of Home: House Home Access: Stairs to enter [employer] of Steps: 4 Entrance Stairs-Rails: Right Home Layout: Two level, Able to live on main level with bedroom/bathroom Alternate Level Stairs-Number of Steps: 1 flight Alternate Level Stairs-Rails: Right Bathroom Shower/Tub: Multimedia programmer: Programmer, systems: Yes Home Equipment: Shower seat - built in, [employer] - tub/shower  Lives With: Alone   Functional History: Prior Function Level of Independence: Independent ADL's / Homemaking Assistance Needed: assist with yardwork Comments: Cooks, cleans, drives. Likes to play golf  Functional Status:  Mobility: Bed Mobility Overal bed mobility: Needs Assistance Bed Mobility: Supine to Sit Supine to sit: Supervision Sit to supine: Modified independent (Device/Increase time), HOB elevated General bed mobility comments: Up in chair. Transfers Overall transfer level: Needs assistance Equipment used: None Transfers: Sit to/from Stand Sit to Stand: Min guard General transfer comment: Min guard to boost from chair x3 with cues for hand placement and cues for WB through BLEs equally to stand. Pt pulling up on rail in hallway at times. Truncal ataxia in standing with sway noted. Ambulation/Gait Ambulation/Gait assistance: Mod assist Ambulation Distance (Feet): 100 Feet (x3 bouts) Assistive device: 1 person hand held assist (rail and HHA) Gait Pattern/deviations: Ataxic, Step-through pattern, Narrow base of support, Scissoring General Gait Details: Ataxic gait pattern with right lateral lean and scissoring gait pattern. Cues to widen BoS and for left lateral lean. 3 longer seated rest breaks due to pain RLE from prior stenting.  Gait velocity: decreased    ADL: ADL Overall ADL's : Needs  assistance/impaired Lower Body Dressing: Minimal assistance, Sit to/from stand Toilet Transfer: Moderate assistance, Ambulation, RW (sit to stand from chair and bed) Toileting- Clothing Manipulation and Hygiene: Minimal assistance, Sit to/from stand Toileting - Clothing Manipulation Details (indicate cue type and reason): assist given for balance as pt was trying to manage urinal Functional mobility during ADLs: Moderate assistance, Rolling walker  Cognition: Cognition Overall Cognitive Status: Within Functional Limits for tasks assessed Orientation Level: Oriented X4 Cognition Arousal/Alertness: Awake/alert Behavior During Therapy: WFL for tasks assessed/performed Overall Cognitive Status: Within Functional Limits for tasks assessed Memory: Decreased short-term memory  Physical Exam: Blood pressure 150/69, pulse 71, temperature 97.8 F (36.6 C), temperature source Oral, resp. rate 20, height '5\' 6"'$  (1.676 m), weight 66.225 kg (146 lb), SpO2 99 %. Physical Exam Constitutional: He is oriented to person, place, and time. He appears well-developed and well-nourished.  HENT: oral mucosa pin, dentures Head: Normocephalic and atraumatic.  Eyes: Conjunctivae and EOM are normal.  Neck: Normal range of motion. Neck supple. No thyromegaly present.  Cardiovascular: Normal rate and regular rhythm.  Respiratory: Effort normal. No respiratory distress.  Good inspiratory effort. He has some upper airway congestion  GI: Soft. Bowel sounds are normal. He exhibits no distension.  Musculoskeletal: He exhibits no edema or tenderness.  Neurological: He is alert and oriented to person, place, and time.  Follow simple commands.  Fair awareness and insight. Speech generally clear.  Mild diplopia with  confrontation testing.  Sensation intact to light touch in all 4's DTRs symmetric Motor: B/L UE 5/5 proximal to distal B/L LE hip flexion 4+/5, knee extenion, ankle dorsi/plantarflexion is 5/5 Right  sided: ataxia and dysmetria with FTN, FTF, and HTS.  Skin: Skin is warm and dry.  Psychiatric: He has a normal mood and affect. His behavior is normal. Thought content normal   Results for orders placed or performed during the hospital encounter of 07/15/15 (from the past 48 hour(s))  Lipid panel     Status: Abnormal   Collection Time: 07/16/15  5:01 AM  Result Value Ref Range   Cholesterol 177 0 - 200 mg/dL   Triglycerides 152 (H) <150 mg/dL   HDL 46 >40 mg/dL   Total CHOL/HDL Ratio 3.8 RATIO   VLDL 30 0 - 40 mg/dL   LDL Cholesterol 101 (H) 0 - 99 mg/dL    Comment:        Total Cholesterol/HDL:CHD Risk Coronary Heart Disease Risk Table                     Men   Women  1/2 Average Risk   3.4   3.3  Average Risk       5.0   4.4  2 X Average Risk   9.6   7.1  3 X Average Risk  23.4   11.0        Use the calculated Patient Ratio above and the CHD Risk Table to determine the patient's CHD Risk.        ATP III CLASSIFICATION (LDL):  <100     mg/dL   Optimal  100-129  mg/dL   Near or Above                    Optimal  130-159  mg/dL   Borderline  160-189  mg/dL   High  >190     mg/dL   Very High   Glucose, capillary     Status: Abnormal   Collection Time: 07/16/15  7:01 AM  Result Value Ref Range   Glucose-Capillary 122 (H) 65 - 99 mg/dL   Comment 1 Notify RN    Comment 2 Document in Chart   Glucose, capillary     Status: Abnormal   Collection Time: 07/16/15  2:33 PM  Result Value Ref Range   Glucose-Capillary 137 (H) 65 - 99 mg/dL  Glucose, capillary     Status: Abnormal   Collection Time: 07/16/15  4:31 PM  Result Value Ref Range   Glucose-Capillary 139 (H) 65 - 99 mg/dL  Glucose, capillary     Status: Abnormal   Collection Time: 07/16/15 10:05 PM  Result Value Ref Range   Glucose-Capillary 114 (H) 65 - 99 mg/dL   Comment 1 Notify RN    Comment 2 Document in Chart   Glucose, capillary     Status: Abnormal   Collection Time: 07/17/15  6:18 AM  Result Value Ref  Range   Glucose-Capillary 116 (H) 65 - 99 mg/dL   Comment 1 Notify RN    Comment 2 Document in Chart   Glucose, capillary     Status: Abnormal   Collection Time: 07/17/15 11:56 AM  Result Value Ref Range   Glucose-Capillary 128 (H) 65 - 99 mg/dL   Comment 1 Notify RN    Comment 2 Document in Chart    Ct Head Wo Contrast  07/16/2015  CLINICAL DATA:  Follow-up examination for stroke.  EXAM: CT HEAD WITHOUT CONTRAST TECHNIQUE: Contiguous axial images were obtained from the base of the skull through the vertex without intravenous contrast. COMPARISON:  Prior MRI from 07/15/2015. FINDINGS: Well demarcated hypodensity involving the right cerebellar hemisphere consistent with acute ischemic infarcts. Overall size and distribution stable from previous MRI. No hemorrhagic transformation by CT. There is localized mass effect with partial effacement of the outflow tract of the fourth ventricle. Fourth ventricle still partially effaced as well. No hydrocephalus. No evidence for ventricular trapping. Basilar cisterns remain patent. No herniation through the foramen magnum. Atrophy with chronic microvascular ischemic disease noted. Remote lacunar infarct within the right thalamus. Additional small remote lacunar infarct within the left basal ganglia. Intracranial atherosclerosis again noted. No other acute large vessel territory infarct. No intracranial hemorrhage. No mass lesion. No extra-axial fluid collection. Scalp soft tissues within normal limits. No acute abnormality about the orbits. Patient is status post cataract extraction. Paranasal sinuses are clear.  No mastoid effusion. Calvarium intact. IMPRESSION: 1. Evolving acute ischemic right cerebellar infarct, stable in size and distribution relative to prior MRI. No CT evidence for hemorrhagic transformation. Mild localized edema with mass effect on the fourth ventricular outflow tract. Fourth ventricle itself remains patent. No hydrocephalus at this time. 2. No  other new acute intracranial process. Electronically Signed   By: Jeannine Boga M.D.   On: 07/16/2015 06:52       Medical Problem List and Plan: 1.  Gait ataxia/dysmetria secondary to right cerebellar infarct. Follow-up cranial CT scan 07/18/2015 2.  DVT Prophylaxis/Anticoagulation: SCDs. Monitor for any signs of DVT 3. Pain Management: Tylenol as needed 4. Mood: Xanax 0.5 mg bedtime as needed 5. Neuropsych: This patient is capable of making decisions on his own behalf. 6. Skin/Wound Care: Routine skin checks 7. Fluids/Electrolytes/Nutrition: Routine I&O's with follow-up chemistries 8. Hypertension. Toprol 25 mg daily. Monitor with increased mobility 9. CAD with remote MI. No chest pain or shortness of breath. Continue aspirin and Plavix 10. COPD with remote tobacco abuse. Continue Dulera twice daily 11. Hyperlipidemia. Lipitor  Post Admission Physician Evaluation: 1. Functional deficits secondary  to right cerebellar infarct. 2. Patient is admitted to receive collaborative, interdisciplinary care between the physiatrist, rehab nursing staff, and therapy team. 3. Patient's level of medical complexity and substantial therapy needs in context of that medical necessity cannot be provided at a lesser intensity of care such as a SNF. 4. Patient has experienced substantial functional loss from his/her baseline which was documented above under the "Functional History" and "Functional Status" headings.  Judging by the patient's diagnosis, physical exam, and functional history, the patient has potential for functional progress which will result in measurable gains while on inpatient rehab.  These gains will be of substantial and practical use upon discharge  in facilitating mobility and self-care at the household level. 5. Physiatrist will provide 24 hour management of medical needs as well as oversight of the therapy plan/treatment and provide guidance as appropriate regarding the interaction  of the two. 6. 24 hour rehab nursing will assist with bladder management, bowel management, safety, skin/wound care, disease management, medication administration, pain management and patient education  and help integrate therapy concepts, techniques,education, etc. 7. PT will assess and treat for/with: Lower extremity strength, range of motion, stamina, balance, functional mobility, safety, adaptive techniques and equipment, NMR, vestibular assessment and treatment, education, community reintegration, ego support.   Goals are: mod I to supervision. 8. OT will assess and treat for/with: ADL's, functional mobility, safety, upper extremity strength,  adaptive techniques and equipment, NMR, vestibular rx/compensation, ego support, education.   Goals are: mod I to supervision. Therapy may proceed with showering this patient. 9. SLP will assess and treat for/with: n/a.  Goals are: n/a. 10. Case Management and Social Worker will assess and treat for psychological issues and discharge planning. 11. Team conference will be held weekly to assess progress toward goals and to determine barriers to discharge. 12. Patient will receive at least 3 hours of therapy per day at least 5 days per week. 13. ELOS: 9-13 days       14. Prognosis:  excellent     Meredith Staggers, MD, [place] [facility] 07/17/2015   07/17/2015

## 2015-07-17 NOTE — Progress Notes (Signed)
STROKE TEAM PROGRESS NOTE   SUBJECTIVE (INTERVAL HISTORY) Patient is up in the chair. Complains of continued HA this am. Continues to work with therapists - encouraged patient. He feels better when up.  Pt walked better with PT today. Less ataxia now on the right. He felt comfortable but still has mild frontal HA, same as before and no worsening. Will repeat CT head in am.   OBJECTIVE Temp:  [98 F (36.7 C)-98.9 F (37.2 C)] 98 F (36.7 C) (05/09 0946) Pulse Rate:  [69-86] 86 (05/09 0946) Cardiac Rhythm:  [-] Heart block (05/09 0745) Resp:  [18-20] 20 (05/09 0946) BP: (156-183)/(73-85) 177/81 mmHg (05/09 0946) SpO2:  [96 %-100 %] 100 % (05/09 0946)  CBC:   Recent Labs Lab 07/15/15 0545 07/15/15 0604  WBC 12.4*  --   NEUTROABS 10.2*  --   HGB 13.6 15.6  HCT 42.2 46.0  MCV 98.4  --   PLT 133*  --     Basic Metabolic Panel:   Recent Labs Lab 07/15/15 0545 07/15/15 0604  NA 136 138  K 3.5 3.5  CL 103 101  CO2 22  --   GLUCOSE 229* 217*  BUN 33* 33*  CREATININE 1.51* 1.40*  CALCIUM 9.4  --     Lipid Panel:     Component Value Date/Time   CHOL 177 07/16/2015 0501   TRIG 152* 07/16/2015 0501   HDL 46 07/16/2015 0501   CHOLHDL 3.8 07/16/2015 0501   VLDL 30 07/16/2015 0501   LDLCALC 101* 07/16/2015 0501   HgbA1c:  Lab Results  Component Value Date   HGBA1C 6.2* 08/11/2014   Urine Drug Screen:     Component Value Date/Time   LABOPIA NONE DETECTED 07/15/2015 1130   COCAINSCRNUR NONE DETECTED 07/15/2015 1130   LABBENZ POSITIVE* 07/15/2015 1130   AMPHETMU NONE DETECTED 07/15/2015 1130   THCU NONE DETECTED 07/15/2015 1130   LABBARB NONE DETECTED 07/15/2015 1130     IMAGING  Ct Head Wo Contrast 07/16/2015  1. Evolving acute ischemic right cerebellar infarct, stable in size and distribution relative to prior MRI. No CT evidence for hemorrhagic transformation. Mild localized edema with mass effect on the fourth ventricular outflow tract. Fourth ventricle  itself remains patent. No hydrocephalus at this time. 2. No other new acute intracranial process.   Mr Angiogram Neck W Wo Contrast 07/15/2015   Exam is limited. There appears to be flow within portions of the vertebral arteries bilaterally with right vertebral artery appearing to be occluded at the C2 level. Suggestion of mild to slightly moderate narrowing proximal left vertebral artery. Bilateral carotid bifurcation narrowing suspected and more notable on the right but not able to adequately grade stenosis on the current exam.   MRI HEAD  07/15/2015  Moderately large acute mid to inferior right cerebellar infarct. There may be a tiny amount hemorrhage along the periphery. Remote right thalamic and left basal ganglia small infarcts. Mild chronic microvascular changes. Mild global atrophy without hydrocephalus. Prominent transverse ligament hypertrophy. Mild contact with the ventral aspect of the cervical medullary junction.   MRA HEAD  07/15/2015  Right vertebral artery and right posterior inferior cerebellar artery are occluded. Mild narrowing distal left vertebral artery proximal to the takeoff of the left posterior inferior cerebellar artery. Mild to moderate narrowing portions of the left posterior inferior cerebellar artery. Ectatic basilar artery with slight irregularity without high-grade stenosis. Nonvisualized left anterior inferior cerebellar artery with small caliber right anterior inferior cerebellar artery. Mild irregularity proximal right  posterior cerebral artery. Moderate narrowing mid and distal aspect left posterior cerebral artery. Distal right posterior cerebral artery branch vessel narrowing. Moderate to marked narrowing right internal carotid artery cavernous segment with regions of adjacent ectasia. Moderate narrowing right internal carotid artery supraclinoid segment. Ectatic irregular left internal carotid artery cavernous segment. 2 mm bulge posterior aspect left internal carotid  artery pre cavernous segment may represent tiny aneurysm or result of atherosclerotic changes. Mild narrowing left internal carotid artery supraclinoid segment Moderate narrowing M1 segment left middle cerebral artery. Mild irregularity M1 segment right middle cerebral artery without high-grade stenosis. Middle cerebral artery branches with areas of ectasia and moderate narrowing. Ectatic anterior cerebral arteries with moderate to marked narrowing portions of the A2 segment greater on the left.   TTE - Left ventricle: The cavity size was normal. Wall thickness was increased in a pattern of moderate LVH. Systolic function was normal. The estimated ejection fraction was in the range of 55% to 60%. Images were inadequate for LV wall motion assessment. Doppler parameters are consistent with abnormal left ventricular relaxation (grade 1 diastolic dysfunction). The E/e&' ratio is >15, suggesting elevated LV filling pressure. - Mitral valve: Mildly thickened leaflets . There was trivial regurgitation. - Left atrium: The atrium was normal in size. - Inferior vena cava: The vessel was normal in size. The respirophasic diameter changes were in the normal range (>= 50%), consistent with normal central venous pressure. Impressions:   LVEF 55-60%, moderate LVH, diastolic dysfunction, elevated LV filling pressure, normal LA size, trivial MR, normal IVC  CUS Right: 1-39% ICA stenosis. Left: 40-59% ICA stenosis. Vertebral artery flow is antegrade.    PHYSICAL EXAM  Temp:  [98 F (36.7 C)-98.9 F (37.2 C)] 98 F (36.7 C) (05/09 0946) Pulse Rate:  [69-86] 86 (05/09 0946) Resp:  [18-20] 20 (05/09 0946) BP: (156-183)/(73-85) 177/81 mmHg (05/09 0946) SpO2:  [96 %-100 %] 100 % (05/09 0946)  General - Well nourished, well developed, in no apparent distress.  Ophthalmologic - Fundi not visualized due to noncooperation.  Cardiovascular - Regular rate and rhythm.  Mental Status -  Level of arousal and  orientation to time, place, and person were intact. Language including expression, naming, repetition, comprehension was assessed and found intact. Fund of Knowledge was assessed and was intact.  Cranial Nerves II - XII - II - Visual field intact OU. III, IV, VI - Extraocular movements intact. V - Facial sensation intact bilaterally. VII - Facial movement intact bilaterally. VIII - Hearing & vestibular intact bilaterally, no obvious nystagmus. X - Palate elevates symmetrically. XI - Chin turning & shoulder shrug intact bilaterally. XII - Tongue protrusion intact.  Motor Strength - The patient's strength was normal in all extremities and pronator drift was absent.  Bulk was normal and fasciculations were absent.   Motor Tone - Muscle tone was assessed at the neck and appendages and was normal.  Reflexes - The patient's reflexes were 1+ in all extremities and he had no pathological reflexes.  Sensory - Light touch, temperature/pinprick were assessed and were symmetrical.    Coordination - The patient had mild right FTN and HTS ataxia, left UE and LE intact, symmetrical on rapid alternative movement bilaterally.  Tremor was absent.  Gait and Station - walk with walker, mild ataxia on the right LE, no truncal ataxia .   ASSESSMENT/PLAN Mr. JANIS CUFFE is a 82 y.o. male with history of  hypertension, hyperlipidemia, myocardial infarction, diabetes presenting with sudden onset dizziness. He did not  receive IV t-PA due to delay in arrival.   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 Old right thalamic basal ganglia infarcts. small vessel disease   MRA head R VA and R PICA occluded. Extensive posterior circulation atherosclerotic disease  MRA neck  R VA occluded at C2 level.    Repeat CT head in am to assess mass effect and hydrocephalus  Carotid Doppler  L 40-59% stenosis  2D Echo  EF 55-60%, no source of embolus  LDL 101  HgbA1c 6.0  SCDs for VTE prophylaxis Diet Heart Room service appropriate?: Yes; Fluid consistency:: Thin  aspirin 81 mg daily prior to admission, now on aspirin 325 mg daily . Given large vessel intracranial atherosclerosis, patient should be treated with aspirin 325 mg and clopidogrel 75 mg orally every day x 3 months for secondary stroke prevention. After 3 months, change to plavix alone. Long-term dual antiplatelets are contraindicated due to risk for intracerebral hemorrhage.   Patient counseled to be compliant with his antithrombotic medications  Ongoing aggressive stroke risk factor management  Therapy recommendations:  CIR. Consult in place  Consider for Stroke AF, but pt declined  Disposition:  pending   Cerebral edema  Repeat CT showed focal mass effect but no hydrocephalus  Will repeat CT in am  Close neuro check  Stat CT if neuro changes.  Carotid stenosis  October 2015, bilateral 40-59% ICA stenosis  CUS R 1-39%, L 40-59%  Asymptomatic this time  Hypertension  Stable  Permissive hypertension (OK if < 220/120) but gradually normalize in 5-7 days  Hyperlipidemia  Home meds:  Pravachol 80  LDL 101, goal < 70  Now on lipitor 20  Continue statin at discharge  Diabetes type II  HgbA1c 6.0, goal < 7.0  Glucose under control  Other Stroke Risk Factors  Advanced age  Former Cigarette smoker, quit smoking 6 years ago   Coronary artery disease s/p MI 1978 and 2011  Peripheral vascular disease s/p Aorto bifemoral bypass graft and right femoral popliteal bypass graft May 2012  Other Active Problems  Hypothermia, etiology unclear, placed on bair hugger initially  Chronic kidney disease stage III  GERD  Insomnia  Hospital day # [place] for  Pager information 07/17/2015 10:35 AM   I, the attending vascular neurologist, have personally obtained a history, examined the patient, evaluated laboratory data, individually viewed imaging studies and agree with radiology interpretations. I also discussed with Dr. Sheran Fava regarding his care plan. Together with the NP/PA, we formulated the assessment and plan of care which reflects our mutual decision.  I have made any additions or clarifications directly to the above note and agree with the findings and plan as currently documented.   82 yo M with multiple stroke risk factors including HTN, HLD, MI, PVD, DM admitted for right PICA infarct. MRA showed right VA occlusion, consistent with large vessel etiology. Repeat CT showed focal swelling but no hydrocephalus and pt neuro stable. Repeat CT in am to evaluate edema and hydrocephalus. Stat CT if neuro changes. Continue DAPT and statin, PT/OT. From stroke standpoint, pt is OK for CIR admission. CT in am.   Rosalin Hawking, MD PhD Stroke Neurology 07/17/2015 5:46 PM        To contact Stroke  Continuity provider, please refer to [email or web address] After hours, contact General Neurology

## 2015-07-17 NOTE — Progress Notes (Signed)
Martin Gong, RN Rehab Admission Coordinator Signed Physical Medicine and Rehabilitation PMR Pre-admission 07/17/2015 1:46 PM  Related encounter: ED to Hosp-Admission (Current) from 07/15/2015 in [facility]   PMR Admission Coordinator Pre-Admission Assessment  Patient: Martin [name] is an 82 y.o., male MRN: [id number] DOB: [birth year] Height: '5\' 6"'$  (167.6 cm) Weight: 66.225 kg (146 lb)  Insurance Information HMO: PPO: PCP: IPA: 80/20: yes OTHER: no HMO PRIMARY: Medicare a and b Policy#: [id number] Subscriber: pt Benefits: Phone #: Passport one Name: 07/17/15 Eff. Date: 09/07/97 Deduct: $1316 Out of Pocket Max: none Life Max: none CIR: 100% SNF: 20 full days Outpatient: 80% Co-Pay: 20 % Home Health: 100% Co-Pay: none DME: 80% Co-Pay: 20% Providers: pt choice  SECONDARY: [place] medical medicare supplement Policy#: [id number] Subscriber: pt  Medicaid Application Date: Case Manager:  Disability Application Date: Case Worker:   Emergency Contact Information Contact Information    Name Relation Home Work Mobile   Price,Denise Grandaughter [phone]     Rickard Patience   [phone]     Current Medical History  Patient Admitting Diagnosis: right cerebellar CVA  History of Present Illness: Martin [name] is a 82 y.o. right handed male coronary artery disease with remote MI, hypertension, COPD with remote tobacco abuse, diabetes mellitus. Independent prior to admission living alone. Admitted 07/15/2015 with double vision and gait disturbance. MRI of the brain shows moderately large acute mid to inferior right cerebellar infarct. Remote right thalamic and left basal ganglia  small infarcts. 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 Patient did not receive TPA. Carotid Dopplers with left 40-59% ICA stenosis. EchocardiogramWith ejection fraction of 30% grade 1 diastolic dysfunction . Neurology consulted presently maintained on aspirin and Plavix for CVA prophylaxis 3 months then Plavix alone. Repeat CT scan showed focal mass effect but no hydrocephalus. Plan a follow-up CT 07/18/2015. Tolerating a regular diet.   NIH Total: 2    Past Medical History  Past Medical History  Diagnosis Date  . Hypertension   . Hyperlipidemia   . Myocardial infarction [facility])     1978 & 2011  . COPD (chronic obstructive pulmonary disease) ([place])   . Peripheral vascular disease, unspecified ([place])   . GERD (gastroesophageal reflux disease)   . Arthritis   . H/O: GI bleed   . Hiatal hernia   . Diabetes mellitus   . Bronchitis   . Renal artery stenosis ([facility])     Family History  family history includes Cancer in his father.  Prior Rehab/Hospitalizations:  Has the patient had major surgery during 100 days prior to admission? No  Current Medications   Current facility-administered medications:  . stroke: mapping our early stages of recovery book, , Does not apply, Once, Martin Craze, NP . 0.9 % sodium chloride infusion, , Intravenous, Continuous, Martin Craze, NP, Last Rate: 50 mL/hr at 07/16/15 2032, 1,000 mL at 07/16/15 2032 . acetaminophen (TYLENOL) tablet 650 mg, 650 mg, Oral, Q4H PRN, 650 mg at 07/17/15 1016 **OR** acetaminophen (TYLENOL) suppository 650 mg, 650 mg, Rectal, Q4H PRN, Martin Craze, NP . ALPRAZolam Duanne Moron) tablet 0.5 mg, 0.5 mg, Oral, QHS PRN, Martin Craze, NP . aspirin EC tablet 325 mg, 325 mg, Oral, Daily, 325 mg at 07/17/15 1010 **OR** aspirin suppository 300 mg, 300 mg, Rectal, Daily, Martin Doom, MD . atorvastatin (LIPITOR)  tablet 20 mg, 20 mg, Oral, q1800, Martin Delaine  Short, MD, 20 mg at 07/16/15 1722 . clopidogrel (PLAVIX) tablet 75 mg, 75 mg, Oral, Daily, Martin Starch, NP, 75 mg at 07/17/15 1011 . famotidine (PEPCID) tablet 20 mg, 20 mg, Oral, BID, Martin Craze, NP, 20 mg at 07/17/15 1011 . metoprolol succinate (TOPROL-XL) 24 hr tablet 25 mg, 25 mg, Oral, Daily, Martin Canterbury, MD, 25 mg at 07/17/15 1011 . mometasone-formoterol (DULERA) 100-5 MCG/ACT inhaler 2 puff, 2 puff, Inhalation, BID, Martin Canterbury, MD, 2 puff at 07/16/15 1230 . senna-docusate (Senokot-S) tablet 1 tablet, 1 tablet, Oral, QHS PRN, Martin Craze, NP  Patients Current Diet: Diet Heart Room service appropriate?: Yes; Fluid consistency:: Thin  Precautions / Restrictions Precautions Precautions: Fall Precaution Comments: ataxia Restrictions Weight Bearing Restrictions: No   Has the patient had 2 or more falls or a fall with injury in the past year?No  Prior Activity Level Limited Community (1-2x/wk): Independent and driving pta. Goes out into community about 1 or two times per week  Development worker, international aid / [place] Devices/Equipment: None Home Equipment: Shower seat - built in, Grab bars - tub/shower  Prior Device Use: Indicate devices/aids used by the patient prior to current illness, exacerbation or injury? None of the above  Prior Functional Level Prior Function Level of Independence: Independent ADL's / Homemaking Assistance Needed: assist with yardwork Comments: Cooks, cleans, drives. Likes to play golf  Self Care: Did the patient need help bathing, dressing, using the toilet or eating? Independent  Indoor Mobility: Did the patient need assistance with walking from room to room (with or without device)? Independent  Stairs: Did the patient need assistance with internal or external stairs (with or without device)? Independent  Functional Cognition: Did the patient need help planning regular  tasks such as shopping or remembering to take medications? Independent  Current Functional Level Cognition  Overall Cognitive Status: Within Functional Limits for tasks assessed Orientation Level: Oriented X4   Extremity Assessment (includes Sensation/Coordination)  Upper Extremity Assessment: RUE deficits/detail (decreased AROM bilateral shoulder flexion-reports arthritis) RUE Deficits / Details: Incoordination noted during FTN testing with overshooting, undershooting target. RUE Sensation: [facility].) RUE Coordination: decreased gross motor (pt reports that fine motor coordination is more effortful)  Lower Extremity Assessment: Defer to PT evaluation RLE Deficits / Details: Incoordination and ataxia noted in RLE during functional mobility. RLE Coordination: decreased gross motor, decreased fine motor    ADLs  Overall ADL's : Needs assistance/impaired Lower Body Dressing: Minimal assistance, Sit to/from stand Toilet Transfer: Moderate assistance, Ambulation, RW (sit to stand from chair and bed) Toileting- Clothing Manipulation and Hygiene: Minimal assistance, Sit to/from stand Toileting - Clothing Manipulation Details (indicate cue type and reason): assist given for balance as pt was trying to manage urinal Functional mobility during ADLs: Moderate assistance, Rolling walker    Mobility  Overal bed mobility: Needs Assistance Bed Mobility: Supine to Sit Supine to sit: Supervision Sit to supine: Modified independent (Device/Increase time), HOB elevated General bed mobility comments: Up in chair.    Transfers  Overall transfer level: Needs assistance Equipment used: None Transfers: Sit to/from Stand Sit to Stand: Min guard General transfer comment: Min guard to boost from chair x3 with cues for hand placement and cues for WB through BLEs equally to stand. Pt pulling up on rail in hallway at times. Truncal ataxia in standing with sway noted.    Ambulation / Gait /  Stairs / Wheelchair Mobility  Ambulation/Gait Ambulation/Gait assistance: Mod assist Ambulation Distance (Feet): 100 Feet (x3 bouts) Assistive  device: 1 person hand held assist (rail and HHA) Gait Pattern/deviations: Ataxic, Step-through pattern, Narrow base of support, Scissoring General Gait Details: Ataxic gait pattern with right lateral lean and scissoring gait pattern. Cues to widen BoS and for left lateral lean. 3 longer seated rest breaks due to pain RLE from prior stenting.  Gait velocity: decreased    Posture / Balance Dynamic Sitting Balance Sitting balance - Comments: Assist to donn socks due to dizziness . Balance Overall balance assessment: Needs assistance Sitting-balance support: Feet supported, No upper extremity supported Sitting balance-Leahy Scale: Fair Sitting balance - Comments: Assist to donn socks due to dizziness . Standing balance support: During functional activity Standing balance-Leahy Scale: Fair Standing balance comment: Able to stand unsupported with Min gaurd assist holding rail for support.     Special needs/care consideration  Bowel mgmt: continent Bladder mgmt:continent Diabetic mgmt yes   Previous Home Environment Living Arrangements: Alone Lives With: Alone Available Help at Discharge: Family (sister states pt can come stay with her at d/c per pt) Type of Home: House Home Layout: Two level, Able to live on main level with bedroom/bathroom Alternate Level Stairs-Rails: Right Alternate Level Stairs-Number of Steps: 1 flight Home Access: Stairs to enter Entrance Stairs-Rails: Right Entrance Stairs-Number of Steps: 4 Bathroom Shower/Tub: Multimedia programmer: Standard Bathroom Accessibility: Yes How Accessible: Accessible via walker Mascot: No  Discharge Living Setting Plans for Discharge Living Setting: Patient's home, Alone Type of Home at Discharge: House Discharge Home Layout: Two level, Able to live on  main level with bedroom/bathroom (only goes upstairs to change his clothes) Alternate Level Stairs-Rails: Right Alternate Level Stairs-Number of Steps: flight Discharge Home Access: Stairs to enter Entrance Stairs-Rails: Right Entrance Stairs-Number of Steps: 4 Discharge Bathroom Shower/Tub: Walk-in shower (with grab bars and built in seat) Discharge Bathroom Toilet: Standard Discharge [place] Accessibility: Yes How Accessible: Accessible via walker Does the patient have any problems obtaining your medications?: No  Social/Family/Support Systems Patient Roles: ( no children, has step granddaughter, Martin [name]) Sport and exercise psychologist Information: Martin [name], sister and grand daughter, Martin [name]: Martin [name] can provide supervision if needed in her home per pt Anticipated [name]'s Contact Information: see above Ability/Limitations of [name]: Martin [name] is 82 yo and very active [name] Availability: 24/7 Discharge Plan Discussed with Primary [name]: Yes Is [name] In Agreement with Plan?: No Does [name]/Family have Issues with Lodging/Transportation while Pt is in Rehab?: No Pt states his sister has offered for him to come stay with her in [facility] for [name] period if needed. She is very independent 82 yo and she goes to see their brother who is 21 yo in [place] nearly every day  Goals/Additional Needs Patient/Family Goal for Rehab: Mod I to supervision with PT and OT Expected length of stay: ELOS 7-12 days Pt/Family Agrees to Admission and willing to participate: Yes Program Orientation Provided & Reviewed with Pt/[name] Including Roles & Responsibilities: Yes   Decrease burden of Care through IP rehab admission: n/a  Possible need for SNF placement upon discharge:not anticipated  Patient Condition: This patient's condition remains as documented in the consult dated 07/17/2015, in which the Rehabilitation Physician determined and documented that the patient's condition is  appropriate for intensive rehabilitative care in an inpatient rehabilitation facility. Will admit to inpatient rehab today.  Preadmission Screen Completed By: Martin [name], 07/17/2015 1:46 PM ______________________________________________________________________  Discussed status with Dr. Naaman Plummer on 07/17/2015 at 1400 and received telephone approval for admission today.  Admission Coordinator: Martin [name], time 1400  Date 07/17/2015          Cosigned by: Martin Staggers, MD at 07/17/2015 2:29 PM  Revision History     Date/Time User Provider Type Action   07/17/2015 2:29 PM Martin Staggers, MD Physician Cosign   07/17/2015 2:00 PM Martin Gong, RN Rehab Admission Coordinator Sign

## 2015-07-17 NOTE — Care Management Important Message (Signed)
Important Message  Patient Details  Name: Martin [name] MRN: [id number] Date of Birth: 05-Jun-1930   Medicare Important Message Given:  Yes    Pollie Friar, RN 07/17/2015, 11:44 AM

## 2015-07-17 NOTE — Progress Notes (Signed)
I met with pt at bedside to discuss a possible inpt rehab admission. He is in agreement. I contacted Dr. Sheran Fava and Dr. Erlinda Hong and discussed d/c plans. They are both in agreement to admit today. I will make the arrangements to admit today. [phone]

## 2015-07-17 NOTE — Progress Notes (Signed)
Physical Therapy Treatment Patient Details Name: Martin [name] MRN: [id number] DOB: 1930-08-25 Today's Date: 07/17/2015    History of Present Illness Patient is an 82 y.o. male with hx of HTN, arthritis, renal artery stenosis, HLD, MI, COPD, PVD, DM and recent carpal tunnel surgery on right wrist 5 weeks ago per pt report.  Pt presented to ED with ataxia and diplopia. MRI- right cerebellar infarct.    PT Comments    Patient progressing well towards PT goals. Tolerated gait training with Mod A for balance, safety and support using rail in hallway. Requires longer seated rest breaks between bouts due to pain in RLE with exercise due to history of stents. Very motivated with therapy. Great rehab candidate. Will follow.   Follow Up Recommendations  CIR     Equipment Recommendations  Other (comment) (TBD)    Recommendations for Other Services       Precautions / Restrictions Precautions Precautions: Fall Precaution Comments: ataxia Restrictions Weight Bearing Restrictions: No    Mobility  Bed Mobility               General bed mobility comments: Up in chair.  Transfers Overall transfer level: Needs assistance Equipment used: None Transfers: Sit to/from Stand Sit to Stand: Min guard         General transfer comment: Min guard to boost from chair x3 with cues for hand placement and cues for WB through BLEs equally to stand. Pt pulling up on rail in hallway at times. Truncal ataxia in standing with sway noted.  Ambulation/Gait Ambulation/Gait assistance: Mod assist Ambulation Distance (Feet): 100 Feet (x3 bouts) Assistive device: 1 person hand held assist (rail and HHA) Gait Pattern/deviations: Ataxic;Step-through pattern;Narrow base of support;Scissoring Gait velocity: decreased   General Gait Details: Ataxic gait pattern with right lateral lean and scissoring gait pattern. Cues to widen BoS and for left lateral lean. 3 longer seated rest breaks due to pain RLE  from prior stenting.    Stairs            Wheelchair Mobility    Modified Rankin (Stroke Patients Only) Modified Rankin (Stroke Patients Only) Pre-Morbid Rankin Score: No symptoms Modified Rankin: Moderately severe disability     Balance Overall balance assessment: Needs assistance Sitting-balance support: Feet supported;No upper extremity supported Sitting balance-Leahy Scale: Fair     Standing balance support: During functional activity Standing balance-Leahy Scale: Fair Standing balance comment: Able to stand unsupported with Min gaurd assist holding rail for support.                     Cognition Arousal/Alertness: Awake/alert Behavior During Therapy: WFL for tasks assessed/performed Overall Cognitive Status: Within Functional Limits for tasks assessed       Memory: Decreased short-term memory              Exercises      General Comments        Pertinent Vitals/Pain Pain Assessment: Faces Faces Pain Scale: Hurts little more Pain Location: RLE with exercise Pain Descriptors / Indicators: Sore;Aching Pain Intervention(s): Monitored during session;Repositioned    Home Living                      Prior Function            PT Goals (current goals can now be found in the care plan section) Progress towards PT goals: Progressing toward goals    Frequency  Min 4X/week    PT Plan  Current plan remains appropriate    Co-evaluation             End of Session Equipment Utilized During Treatment: Gait belt Activity Tolerance: Patient tolerated treatment well Patient left: in chair;with call bell/phone within reach;with chair alarm set     Time: [phone] PT Time Calculation (min) (ACUTE ONLY): 28 min  Charges:  $Gait Training: 23-37 mins                    G Codes:      [place]arnoor Reta A Espn Zeman 07/17/2015, 9:59 AM  Wray Kearns, [place], DPT [phone]

## 2015-07-17 NOTE — Interval H&P Note (Signed)
Martin [name] was admitted today to Inpatient Rehabilitation with the diagnosis of right cerebellar infarct.  The patient's history has been reviewed, patient examined, and there is no change in status.  Patient continues to be appropriate for intensive inpatient rehabilitation.  I have reviewed the patient's chart and labs.  Questions were answered to the patient's satisfaction. The PAPE has been reviewed and assessment remains appropriate.  Martin [name] 07/17/2015, 8:03 PM

## 2015-07-17 NOTE — Progress Notes (Signed)
Ankit Lorie Phenix, MD Physician Signed Physical Medicine and Rehabilitation Consult Note 07/16/2015 12:05 PM  Related encounter: ED to Hosp-Admission (Current) from 07/15/2015 in [facility]        Physical Medicine and Rehabilitation Consult Reason for Consult: Right cerebellar infarct Referring Physician: [facility]   HPI: Martin [name] is a 82 y.o. right handed male coronary artery disease with remote MI, hypertension, COPD with remote tobacco abuse, diabetes mellitus. Independent prior to admission living alone. 2 level home with bedroom upstairs. He has a brother who lives across the street but cannot provide assistance. Admitted 07/15/2015 with double vision and gait disturbance. MRI of the brain shows moderately large acute mid to inferior right cerebellar infarct. Remote right thalamic and left basal ganglia small infarcts. 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 Patient did not receive TPA. Carotid Dopplers with left 40-59% ICA stenosis. Echocardiogram pending. Neurology consulted presently maintained on aspirin and Plavix for CVA prophylaxis. Tolerating a regular diet. Physical therapy evaluation completed 07/16/2015 with recommendations of physical medicine rehabilitation consult.  Review of Systems  Constitutional: Negative for fever and chills.  HENT: Negative for hearing loss.  Eyes: Positive for double vision.  Respiratory: Positive for cough.   Shortness of breath with heavy exertion  Gastrointestinal: Positive for constipation. Negative for nausea.   GERD  Genitourinary: Negative for dysuria and hematuria.  Musculoskeletal: Positive for joint pain.  Skin: Negative for rash.  Neurological: Positive for dizziness. Negative for seizures, weakness and headaches.  All other systems reviewed and are negative.  Past Medical History  Diagnosis Date  .  Hypertension   . Hyperlipidemia   . Myocardial infarction [facility])     1978 & 2011  . COPD (chronic obstructive pulmonary disease) ([place])   . Peripheral vascular disease, unspecified ([place])   . GERD (gastroesophageal reflux disease)   . Arthritis   . H/O: GI bleed   . Hiatal hernia   . Diabetes mellitus   . Bronchitis   . Renal artery stenosis [facility])    Past Surgical History  Procedure Laterality Date  . Aortobifemoral bpg  07/22/10    and Right femoral-popliteal BPG   Family History  Problem Relation Age of Onset  . Cancer Father     LIVER CANCER   Social History:  reports that he quit smoking about 6 years ago. His smoking use included Cigarettes. He quit after 65 years of use. He has never used smokeless tobacco. He reports that he does not drink alcohol or use illicit drugs. Allergies:  Allergies  Allergen Reactions  . Penicillins Rash   Medications Prior to Admission  Medication Sig Dispense Refill  . ALPRAZolam (XANAX) 0.5 MG tablet TAKE ONE TABLET BY MOUTH AT BEDTIME AS NEEDED FOR ANXIETY OR INSOMNIA. 30 tablet 2  . aspirin 81 MG tablet Take 81 mg by mouth daily.     Marland Kitchen CARTIA XT 120 MG 24 hr capsule TAKE ONE CAPSULE BY MOUTH ONCE DAILY 30 capsule 0  . famotidine (PEPCID) 20 MG tablet TAKE ONE TABLET BY MOUTH TWICE DAILY 60 tablet 5  . losartan (COZAAR) 50 MG tablet TAKE ONE TABLET BY MOUTH ONCE DAILY 30 tablet 3  . metoprolol succinate (TOPROL-XL) 25 MG 24 hr tablet TAKE ONE TABLET BY MOUTH ONCE DAILY. 30 tablet 11  . nitroGLYCERIN (NITROSTAT) 0.4 MG SL tablet Place 1 tablet (0.4 mg total) under the tongue  as directed. 25 tablet 1  . budesonide-formoterol (SYMBICORT) 160-4.5 MCG/ACT inhaler INHALE ONE PUFF BY MOUTH TWICE DAILY (Patient not taking: Reported on 01/04/2015) 11 g 6  . pravastatin (PRAVACHOL) 80 MG tablet Take 1 tablet (80 mg total) by mouth daily.  (Patient not taking: Reported on 01/04/2015) 90 tablet 3    Home: [place] expects to be discharged to:: Private residence Living Arrangements: Alone Available Help at Discharge: (brother lives across street not but able to help, "he sees demons.") Type of Home: House Home Access: Stairs to enter [employer] of Steps: 4 Entrance Stairs-Rails: Right Home Layout: Two level, Bed/bath upstairs (clothes upstairs) Alternate Level Stairs-Number of Steps: 1 flight Alternate Level Stairs-Rails: Right Home Equipment: None  Functional History: Prior Function Level of Independence: Independent Comments: Cooks, cleans, drives. LIke to play golf Functional Status:  Mobility: Bed Mobility Overal bed mobility: Needs Assistance Bed Mobility: Supine to Sit, Sit to Supine Supine to sit: Modified independent (Device/Increase time), HOB elevated Sit to supine: Modified independent (Device/Increase time), HOB elevated General bed mobility comments: Use of rail to get to EOB. + dizziness with truncal sway noted.  Transfers Overall transfer level: Needs assistance Equipment used: Rolling walker (2 wheeled) Transfers: Sit to/from Stand Sit to Stand: Min assist, Min guard General transfer comment: Min guard to boost from EOB and Min A to steady upon standing. + dizziness.  Ambulation/Gait Ambulation/Gait assistance: Mod assist Ambulation Distance (Feet): 75 Feet Assistive device: Rolling walker (2 wheeled) Gait Pattern/deviations: Ataxic, Step-through pattern, Staggering right, Staggering left, Wide base of support General Gait Details: Ataxic gait pattern wtih tendency to stagger to the right. Right lean noted as well. Required assist with balance and RW negotiation and to decrease gait speed.    ADL:    Cognition: Cognition Overall Cognitive Status: Within Functional Limits for tasks assessed Orientation Level: Oriented X4 Cognition Arousal/Alertness:  Awake/alert Behavior During Therapy: WFL for tasks assessed/performed Overall Cognitive Status: Within Functional Limits for tasks assessed  Blood pressure 152/68, pulse 78, temperature 98 F (36.7 C), temperature source Oral, resp. rate 18, height '5\' 6"'$  (1.676 m), weight 66.225 kg (146 lb), SpO2 95 %. Physical Exam  Vitals reviewed. Constitutional: He is oriented to person, place, and time. He appears well-developed and well-nourished.  HENT:  Head: Normocephalic and atraumatic.  Eyes: Conjunctivae and EOM are normal.  Neck: Normal range of motion. Neck supple. No thyromegaly present.  Cardiovascular: Normal rate and regular rhythm.  Respiratory: Effort normal. No respiratory distress.  Good inspiratory effort. He has some upper airway congestion  GI: Soft. Bowel sounds are normal. He exhibits no distension.  Musculoskeletal: He exhibits no edema or tenderness.  Neurological: He is alert and oriented to person, place, and time.  Follow simple commands.  Fair awareness of deficits. Sensation intact to light touch DTRs symmetric Motor: B/l UE 5/5 proximal to distal B/l LE hip flexion 4+/5, knee extenion, ankle dorsi/plantarflexion 5/5 Right sided: ataxia and dysmetria  Skin: Skin is warm and dry.  Psychiatric: He has a normal mood and affect. His behavior is normal. Thought content normal.     Lab Results Last 24 Hours    Results for orders placed or performed during the hospital encounter of 07/15/15 (from the past 24 hour(s))  Lipid panel Status: Abnormal   Collection Time: 07/16/15 5:01 AM  Result Value Ref Range   Cholesterol 177 0 - 200 mg/dL   Triglycerides 152 (H) <150 mg/dL   HDL 46 >40 mg/dL  Total CHOL/HDL Ratio 3.8 RATIO   VLDL 30 0 - 40 mg/dL   LDL Cholesterol 101 (H) 0 - 99 mg/dL  Glucose, capillary Status: Abnormal   Collection Time: 07/16/15 7:01 AM  Result Value Ref Range   Glucose-Capillary 122 (H) 65 -  99 mg/dL   Comment 1 Notify RN    Comment 2 Document in Chart       Imaging Results (Last 48 hours)    Ct Head Wo Contrast  07/16/2015 CLINICAL DATA: Follow-up examination for stroke. EXAM: CT HEAD WITHOUT CONTRAST TECHNIQUE: Contiguous axial images were obtained from the base of the skull through the vertex without intravenous contrast. COMPARISON: Prior MRI from 07/15/2015. FINDINGS: Well demarcated hypodensity involving the right cerebellar hemisphere consistent with acute ischemic infarcts. Overall size and distribution stable from previous MRI. No hemorrhagic transformation by CT. There is localized mass effect with partial effacement of the outflow tract of the fourth ventricle. Fourth ventricle still partially effaced as well. No hydrocephalus. No evidence for ventricular trapping. Basilar cisterns remain patent. No herniation through the foramen magnum. Atrophy with chronic microvascular ischemic disease noted. Remote lacunar infarct within the right thalamus. Additional small remote lacunar infarct within the left basal ganglia. Intracranial atherosclerosis again noted. No other acute large vessel territory infarct. No intracranial hemorrhage. No mass lesion. No extra-axial fluid collection. Scalp soft tissues within normal limits. No acute abnormality about the orbits. Patient is status post cataract extraction. Paranasal sinuses are clear. No mastoid effusion. Calvarium intact. IMPRESSION: 1. Evolving acute ischemic right cerebellar infarct, stable in size and distribution relative to prior MRI. No CT evidence for hemorrhagic transformation. Mild localized edema with mass effect on the fourth ventricular outflow tract. Fourth ventricle itself remains patent. No hydrocephalus at this time. 2. No other new acute intracranial process. Electronically Signed By: Jeannine Boga M.D. On: 07/16/2015 06:52   Mr Angiogram Head Wo Contrast  07/15/2015 CLINICAL DATA: 82 year old  hypertensive male with diplopia and ataxia. Initial encounter. EXAM: MRI HEAD WITHOUT CONTRAST MRA HEAD WITHOUT CONTRAST TECHNIQUE: Multiplanar, multiecho pulse sequences of the brain and surrounding structures were obtained without intravenous contrast. Angiographic images of the head were obtained using MRA technique without contrast. COMPARISON: None. FINDINGS: MRI HEAD FINDINGS Moderately large acute mid to inferior right cerebellar infarct. There may be a tiny amount hemorrhage along the periphery. Abnormal appearance of the right vertebral artery. Remote right thalamic and left basal ganglia small infarcts. Mild chronic microvascular changes. Mild global atrophy without hydrocephalus. No intracranial mass lesion noted on this unenhanced exam. Post lens replacement without acute orbital abnormality noted. Minimal partial opacification mastoid air cells. Minimal mucosal thickening ethmoid sinus air cells. Prominent transverse ligament hypertrophy. Mild contact with the ventral aspect of the cervical medullary junction. Artifact limits evaluation of C4 and C5. Currently, no cerebellar tonsil herniation or hydrocephalus. If infarct swells, patient may be at risk for development of hydrocephalus or downward herniation of the cerebellar tonsils. Partially empty expanded sella. MRA HEAD FINDINGS Right vertebral artery and right posterior inferior cerebellar artery are occluded. Mild narrowing distal left vertebral artery proximal to the takeoff of the left posterior inferior cerebellar artery. Mild to moderate narrowing portions of the left posterior inferior cerebellar artery. Ectatic basilar artery with slight irregularity without high-grade stenosis. Nonvisualized left anterior inferior cerebellar artery with small caliber right anterior inferior cerebellar artery. Mild irregularity proximal right posterior cerebral artery. Moderate narrowing mid and distal aspect left posterior cerebral artery. Distal right  posterior cerebral artery branch vessel narrowing.  Moderate to marked narrowing right internal carotid artery cavernous segment with regions of adjacent ectasia. Moderate narrowing right internal carotid artery supraclinoid segment. Ectatic irregular left internal carotid artery cavernous segment. 2 mm bulge posterior aspect left internal carotid artery pre cavernous segment may represent tiny aneurysm or result of atherosclerotic changes. Mild narrowing left internal carotid artery supraclinoid segment Moderate narrowing M1 segment left middle cerebral artery. Mild irregularity M1 segment right middle cerebral artery without high-grade stenosis. Middle cerebral artery branches with areas of ectasia and moderate narrowing. Ectatic anterior cerebral arteries with moderate to marked narrowing portions of the A2 segment greater on the left. IMPRESSION: MRI HEAD Moderately large acute mid to inferior right cerebellar infarct. There may be a tiny amount hemorrhage along the periphery. Remote right thalamic and left basal ganglia small infarcts. Mild chronic microvascular changes. Mild global atrophy without hydrocephalus. Prominent transverse ligament hypertrophy. Mild contact with the ventral aspect of the cervical medullary junction. MRA HEAD Right vertebral artery and right posterior inferior cerebellar artery are occluded. Mild narrowing distal left vertebral artery proximal to the takeoff of the left posterior inferior cerebellar artery. Mild to moderate narrowing portions of the left posterior inferior cerebellar artery. Ectatic basilar artery with slight irregularity without high-grade stenosis. Nonvisualized left anterior inferior cerebellar artery with small caliber right anterior inferior cerebellar artery. Mild irregularity proximal right posterior cerebral artery. Moderate narrowing mid and distal aspect left posterior cerebral artery. Distal right posterior cerebral artery branch vessel narrowing. Moderate  to marked narrowing right internal carotid artery cavernous segment with regions of adjacent ectasia. Moderate narrowing right internal carotid artery supraclinoid segment. Ectatic irregular left internal carotid artery cavernous segment. 2 mm bulge posterior aspect left internal carotid artery pre cavernous segment may represent tiny aneurysm or result of atherosclerotic changes. Mild narrowing left internal carotid artery supraclinoid segment Moderate narrowing M1 segment left middle cerebral artery. Mild irregularity M1 segment right middle cerebral artery without high-grade stenosis. Middle cerebral artery branches with areas of ectasia and moderate narrowing. Ectatic anterior cerebral arteries with moderate to marked narrowing portions of the A2 segment greater on the left. These results were called by telephone at the time of interpretation on 07/15/2015 at 8:28 am to Dr. Lennette Bihari CAMPOS ; [place] who verbally acknowledged these results. Electronically Signed By: Genia Del M.D. On: 07/15/2015 08:59   Mr Angiogram Neck W Wo Contrast  07/15/2015 CLINICAL DATA: 82 year old hypertensive male with acute right cerebellar infarct. Subsequent encounter. EXAM: MRA NECK WITHOUT AND WITH CONTRAST TECHNIQUE: Multiplanar and multiecho pulse sequences of the neck were obtained without and with intravenous contrast. Angiographic images of the neck were obtained using MRA technique without and with intravenous contrast. CONTRAST: 69m MULTIHANCE GADOBENATE DIMEGLUMINE 529 MG/ML IV SOLN COMPARISON: MR brain and MR angiogram 07/15/2015. FINDINGS: Exam is limited. There appears to be flow within portions of the vertebral arteries bilaterally with right vertebral artery appearing to be occluded at the C2 level. Suggestion of mild to slightly moderate narrowing proximal left vertebral artery. Bilateral carotid bifurcation narrowing suspected and more notable on the right but not able to adequately grade  stenosis on the current exam. IMPRESSION: Exam is limited. There appears to be flow within portions of the vertebral arteries bilaterally with right vertebral artery appearing to be occluded at the C2 level. Suggestion of mild to slightly moderate narrowing proximal left vertebral artery. Bilateral carotid bifurcation narrowing suspected and more notable on the right but not able to adequately grade stenosis on the current exam. Electronically  Signed By: Genia Del M.D. On: 07/15/2015 11:17   Mr Brain Wo Contrast  07/15/2015 CLINICAL DATA: 82 year old hypertensive male with diplopia and ataxia. Initial encounter. EXAM: MRI HEAD WITHOUT CONTRAST MRA HEAD WITHOUT CONTRAST TECHNIQUE: Multiplanar, multiecho pulse sequences of the brain and surrounding structures were obtained without intravenous contrast. Angiographic images of the head were obtained using MRA technique without contrast. COMPARISON: None. FINDINGS: MRI HEAD FINDINGS Moderately large acute mid to inferior right cerebellar infarct. There may be a tiny amount hemorrhage along the periphery. Abnormal appearance of the right vertebral artery. Remote right thalamic and left basal ganglia small infarcts. Mild chronic microvascular changes. Mild global atrophy without hydrocephalus. No intracranial mass lesion noted on this unenhanced exam. Post lens replacement without acute orbital abnormality noted. Minimal partial opacification mastoid air cells. Minimal mucosal thickening ethmoid sinus air cells. Prominent transverse ligament hypertrophy. Mild contact with the ventral aspect of the cervical medullary junction. Artifact limits evaluation of C4 and C5. Currently, no cerebellar tonsil herniation or hydrocephalus. If infarct swells, patient may be at risk for development of hydrocephalus or downward herniation of the cerebellar tonsils. Partially empty expanded sella. MRA HEAD FINDINGS Right vertebral artery and right posterior inferior  cerebellar artery are occluded. Mild narrowing distal left vertebral artery proximal to the takeoff of the left posterior inferior cerebellar artery. Mild to moderate narrowing portions of the left posterior inferior cerebellar artery. Ectatic basilar artery with slight irregularity without high-grade stenosis. Nonvisualized left anterior inferior cerebellar artery with small caliber right anterior inferior cerebellar artery. Mild irregularity proximal right posterior cerebral artery. Moderate narrowing mid and distal aspect left posterior cerebral artery. Distal right posterior cerebral artery branch vessel narrowing. Moderate to marked narrowing right internal carotid artery cavernous segment with regions of adjacent ectasia. Moderate narrowing right internal carotid artery supraclinoid segment. Ectatic irregular left internal carotid artery cavernous segment. 2 mm bulge posterior aspect left internal carotid artery pre cavernous segment may represent tiny aneurysm or result of atherosclerotic changes. Mild narrowing left internal carotid artery supraclinoid segment Moderate narrowing M1 segment left middle cerebral artery. Mild irregularity M1 segment right middle cerebral artery without high-grade stenosis. Middle cerebral artery branches with areas of ectasia and moderate narrowing. Ectatic anterior cerebral arteries with moderate to marked narrowing portions of the A2 segment greater on the left. IMPRESSION: MRI HEAD Moderately large acute mid to inferior right cerebellar infarct. There may be a tiny amount hemorrhage along the periphery. Remote right thalamic and left basal ganglia small infarcts. Mild chronic microvascular changes. Mild global atrophy without hydrocephalus. Prominent transverse ligament hypertrophy. Mild contact with the ventral aspect of the cervical medullary junction. MRA HEAD Right vertebral artery and right posterior inferior cerebellar artery are occluded. Mild narrowing distal left  vertebral artery proximal to the takeoff of the left posterior inferior cerebellar artery. Mild to moderate narrowing portions of the left posterior inferior cerebellar artery. Ectatic basilar artery with slight irregularity without high-grade stenosis. Nonvisualized left anterior inferior cerebellar artery with small caliber right anterior inferior cerebellar artery. Mild irregularity proximal right posterior cerebral artery. Moderate narrowing mid and distal aspect left posterior cerebral artery. Distal right posterior cerebral artery branch vessel narrowing. Moderate to marked narrowing right internal carotid artery cavernous segment with regions of adjacent ectasia. Moderate narrowing right internal carotid artery supraclinoid segment. Ectatic irregular left internal carotid artery cavernous segment. 2 mm bulge posterior aspect left internal carotid artery pre cavernous segment may represent tiny aneurysm or result of atherosclerotic changes. Mild narrowing left internal carotid artery  supraclinoid segment Moderate narrowing M1 segment left middle cerebral artery. Mild irregularity M1 segment right middle cerebral artery without high-grade stenosis. Middle cerebral artery branches with areas of ectasia and moderate narrowing. Ectatic anterior cerebral arteries with moderate to marked narrowing portions of the A2 segment greater on the left. These results were called by telephone at the time of interpretation on 07/15/2015 at 8:28 am to Dr. Lennette Bihari CAMPOS ; [place] who verbally acknowledged these results. Electronically Signed By: Genia Del M.D. On: 07/15/2015 08:59     Assessment/Plan: Diagnosis: Right cerebellar infarct Labs and images independently reviewed. Records reviewed and summated above. Stroke: Continue secondary stroke prophylaxis and Risk Factor Modification listed below:  Antiplatelet therapy:  Blood Pressure Management: Continue current medication with prn's  with permisive HTN per primary team Statin Agent:  Diabetes management:   1. Does the need for close, 24 hr/day medical supervision in concert with the patient's rehab needs make it unreasonable for this patient to be served in a less intensive setting? Yes  2. Co-Morbidities requiring supervision/potential complications: coronary artery disease with remote MI (cont meds), HTN (monitor and provide prns in accordance with increased physical exertion and pain), COPD with remote tobacco abuse (monitor RR and O2 sats with increased physical activity), diabetes mellitus (Monitor in accordance with exercise and adjust meds as necessary), CKD (avoid nephrotoxic meds), leukocytosis (cont to monitor for signs and symptoms of infection, further workup if indicated), Thrombocytopenia (< 60,000/mm3 no resistive exercise) 3. Due to safety, disease management and patient education, does the patient require 24 hr/day rehab nursing? Yes 4. Does the patient require coordinated care of a physician, rehab nurse, PT (1-2 hrs/day, 5 days/week) and OT (1-2 hrs/day, 5 days/week) to address physical and functional deficits in the context of the above medical diagnosis(es)? Yes Addressing deficits in the following areas: balance, endurance, locomotion, transferring, toileting and psychosocial support 5. Can the patient actively participate in an intensive therapy program of at least 3 hrs of therapy per day at least 5 days per week? Yes 6. The potential for patient to make measurable gains while on inpatient rehab is excellent 7. Anticipated functional outcomes upon discharge from inpatient rehab are modified independent and supervision with PT, modified independent with OT, n/a with SLP. 8. Estimated rehab length of stay to reach the above functional goals is: 7-12 days. 9. Does the patient have adequate social supports and living environment to accommodate these discharge functional goals? Potentially 10. Anticipated  D/C setting: Home 11. Anticipated post D/C treatments: HH therapy and Home excercise program 12. Overall Rehab/Functional Prognosis: good  RECOMMENDATIONS: This patient's condition is appropriate for continued rehabilitative care in the following setting: CIR  Patient has agreed to participate in recommended program. Yes Note that insurance prior authorization may be required for reimbursement for recommended care.  Comment: Rehab Admissions Coordinator to follow up.  Delice Lesch, MD 07/16/2015       Revision History     Date/Time User Provider Type Action   07/16/2015 4:16 PM Ankit Lorie Phenix, MD Physician Sign   07/16/2015 12:15 PM Cathlyn Parsons, PA-C Physician Assistant P[place]   View Details Report       Routing History     Date/Time From To Method   07/16/2015 4:16 PM Ankit Lorie Phenix, MD Susy Frizzle, MD In [facility]

## 2015-07-17 NOTE — H&P (View-Only) (Signed)
Physical Medicine and Rehabilitation Admission H&P    Chief Complaint  Patient presents with  . Weakness  . Dizziness  : HPI: Martin [name] is a 82 y.o. right handed male coronary artery disease with remote MI, hypertension, COPD with remote tobacco abuse, diabetes mellitus. Independent prior to admission living alone. 2 level home with bedroom upstairs. He has a brother who lives across the street but cannot provide assistance. Admitted 07/15/2015 with double vision and gait disturbance. MRI of the brain shows moderately large acute mid to inferior right cerebellar infarct. Remote right thalamic and left basal ganglia small infarcts. 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 Patient did not receive TPA. Carotid Dopplers with left 40-59% ICA stenosis. EchocardiogramWith ejection fraction of 81% grade 1 diastolic dysfunction . Neurology consulted presently maintained on aspirin and Plavix for CVA prophylaxis 3 months then Plavix alone. Repeat CT scan showed focal mass effect but no hydrocephalus. Plan a follow-up CT 07/18/2015. Tolerating a regular diet. PhysicalAnd occupational therapy evaluations completed 07/16/2015 with recommendations of physical medicine rehabilitation consult.Patient was admitted for a comprehensive rehabilitation program  ROS Constitutional: Negative for fever and chills.  HENT: Negative for hearing loss.  Eyes: Positive for double vision.  Respiratory: Positive for cough.   Shortness of breath with heavy exertion  Gastrointestinal: Positive for constipation. Negative for nausea.   GERD  Genitourinary: Negative for dysuria and hematuria.  Musculoskeletal: Positive for joint pain.  Skin: Negative for rash.  Neurological: Positive for dizziness. Negative for seizures, weakness and headaches.  All other systems reviewed and are negative   Past Medical History  Diagnosis Date  . Hypertension   .  Hyperlipidemia   . Myocardial infarction [facility])     1978 & 2011  . COPD (chronic obstructive pulmonary disease) ([place])   . Peripheral vascular disease, unspecified ([place])   . GERD (gastroesophageal reflux disease)   . Arthritis   . H/O: GI bleed   . Hiatal hernia   . Diabetes mellitus   . Bronchitis   . Renal artery stenosis [facility])    Past Surgical History  Procedure Laterality Date  . Aortobifemoral bpg  07/22/10    and Right femoral-popliteal BPG   Family History  Problem Relation Age of Onset  . Cancer Father     LIVER CANCER   Social History:  reports that he quit smoking about 6 years ago. His smoking use included Cigarettes. He quit after 65 years of use. He has never used smokeless tobacco. He reports that he does not drink alcohol or use illicit drugs. Allergies:  Allergies  Allergen Reactions  . Penicillins Rash   Medications Prior to Admission  Medication Sig Dispense Refill  . ALPRAZolam (XANAX) 0.5 MG tablet TAKE ONE TABLET BY MOUTH AT BEDTIME AS NEEDED FOR ANXIETY OR INSOMNIA. 30 tablet 2  . aspirin 81 MG tablet Take 81 mg by mouth daily.      Marland Kitchen CARTIA XT 120 MG 24 hr capsule TAKE ONE CAPSULE BY MOUTH ONCE DAILY 30 capsule 0  . famotidine (PEPCID) 20 MG tablet TAKE ONE TABLET BY MOUTH TWICE DAILY 60 tablet 5  . losartan (COZAAR) 50 MG tablet TAKE ONE TABLET BY MOUTH ONCE DAILY 30 tablet 3  . metoprolol succinate (TOPROL-XL) 25 MG 24 hr tablet TAKE ONE TABLET BY MOUTH ONCE DAILY. 30 tablet 11  . nitroGLYCERIN (NITROSTAT) 0.4 MG SL tablet Place 1 tablet (0.4 mg total) under the tongue as directed.  25 tablet 1  . budesonide-formoterol (SYMBICORT) 160-4.5 MCG/ACT inhaler INHALE ONE PUFF BY MOUTH TWICE DAILY (Patient not taking: Reported on 01/04/2015) 11 g 6  . pravastatin (PRAVACHOL) 80 MG tablet Take 1 tablet (80 mg total) by mouth daily. (Patient not taking: Reported on 01/04/2015) 90 tablet 3    Home: [place] expects to be discharged to::  Private residence Living Arrangements: Alone Available Help at Discharge: Family (sister states pt can come stay with her at d/c per pt) Type of Home: House Home Access: Stairs to enter [employer] of Steps: 4 Entrance Stairs-Rails: Right Home Layout: Two level, Able to live on main level with bedroom/bathroom Alternate Level Stairs-Number of Steps: 1 flight Alternate Level Stairs-Rails: Right Bathroom Shower/Tub: Multimedia programmer: Programmer, systems: Yes Home Equipment: Shower seat - built in, [employer] - tub/shower  Lives With: Alone   Functional History: Prior Function Level of Independence: Independent ADL's / Homemaking Assistance Needed: assist with yardwork Comments: Cooks, cleans, drives. Likes to play golf  Functional Status:  Mobility: Bed Mobility Overal bed mobility: Needs Assistance Bed Mobility: Supine to Sit Supine to sit: Supervision Sit to supine: Modified independent (Device/Increase time), HOB elevated General bed mobility comments: Up in chair. Transfers Overall transfer level: Needs assistance Equipment used: None Transfers: Sit to/from Stand Sit to Stand: Min guard General transfer comment: Min guard to boost from chair x3 with cues for hand placement and cues for WB through BLEs equally to stand. Pt pulling up on rail in hallway at times. Truncal ataxia in standing with sway noted. Ambulation/Gait Ambulation/Gait assistance: Mod assist Ambulation Distance (Feet): 100 Feet (x3 bouts) Assistive device: 1 person hand held assist (rail and HHA) Gait Pattern/deviations: Ataxic, Step-through pattern, Narrow base of support, Scissoring General Gait Details: Ataxic gait pattern with right lateral lean and scissoring gait pattern. Cues to widen BoS and for left lateral lean. 3 longer seated rest breaks due to pain RLE from prior stenting.  Gait velocity: decreased    ADL: ADL Overall ADL's : Needs  assistance/impaired Lower Body Dressing: Minimal assistance, Sit to/from stand Toilet Transfer: Moderate assistance, Ambulation, RW (sit to stand from chair and bed) Toileting- Clothing Manipulation and Hygiene: Minimal assistance, Sit to/from stand Toileting - Clothing Manipulation Details (indicate cue type and reason): assist given for balance as pt was trying to manage urinal Functional mobility during ADLs: Moderate assistance, Rolling walker  Cognition: Cognition Overall Cognitive Status: Within Functional Limits for tasks assessed Orientation Level: Oriented X4 Cognition Arousal/Alertness: Awake/alert Behavior During Therapy: WFL for tasks assessed/performed Overall Cognitive Status: Within Functional Limits for tasks assessed Memory: Decreased short-term memory  Physical Exam: Blood pressure 150/69, pulse 71, temperature 97.8 F (36.6 C), temperature source Oral, resp. rate 20, height '5\' 6"'$  (1.676 m), weight 66.225 kg (146 lb), SpO2 99 %. Physical Exam Constitutional: He is oriented to person, place, and time. He appears well-developed and well-nourished.  HENT: oral mucosa pin, dentures Head: Normocephalic and atraumatic.  Eyes: Conjunctivae and EOM are normal.  Neck: Normal range of motion. Neck supple. No thyromegaly present.  Cardiovascular: Normal rate and regular rhythm.  Respiratory: Effort normal. No respiratory distress.  Good inspiratory effort. He has some upper airway congestion  GI: Soft. Bowel sounds are normal. He exhibits no distension.  Musculoskeletal: He exhibits no edema or tenderness.  Neurological: He is alert and oriented to person, place, and time.  Follow simple commands.  Fair awareness and insight. Speech generally clear.  Mild diplopia with  confrontation testing.  Sensation intact to light touch in all 4's DTRs symmetric Motor: B/L UE 5/5 proximal to distal B/L LE hip flexion 4+/5, knee extenion, ankle dorsi/plantarflexion is 5/5 Right  sided: ataxia and dysmetria with FTN, FTF, and HTS.  Skin: Skin is warm and dry.  Psychiatric: He has a normal mood and affect. His behavior is normal. Thought content normal   Results for orders placed or performed during the hospital encounter of 07/15/15 (from the past 48 hour(s))  Lipid panel     Status: Abnormal   Collection Time: 07/16/15  5:01 AM  Result Value Ref Range   Cholesterol 177 0 - 200 mg/dL   Triglycerides 152 (H) <150 mg/dL   HDL 46 >40 mg/dL   Total CHOL/HDL Ratio 3.8 RATIO   VLDL 30 0 - 40 mg/dL   LDL Cholesterol 101 (H) 0 - 99 mg/dL    Comment:        Total Cholesterol/HDL:CHD Risk Coronary Heart Disease Risk Table                     Men   Women  1/2 Average Risk   3.4   3.3  Average Risk       5.0   4.4  2 X Average Risk   9.6   7.1  3 X Average Risk  23.4   11.0        Use the calculated Patient Ratio above and the CHD Risk Table to determine the patient's CHD Risk.        ATP III CLASSIFICATION (LDL):  <100     mg/dL   Optimal  100-129  mg/dL   Near or Above                    Optimal  130-159  mg/dL   Borderline  160-189  mg/dL   High  >190     mg/dL   Very High   Glucose, capillary     Status: Abnormal   Collection Time: 07/16/15  7:01 AM  Result Value Ref Range   Glucose-Capillary 122 (H) 65 - 99 mg/dL   Comment 1 Notify RN    Comment 2 Document in Chart   Glucose, capillary     Status: Abnormal   Collection Time: 07/16/15  2:33 PM  Result Value Ref Range   Glucose-Capillary 137 (H) 65 - 99 mg/dL  Glucose, capillary     Status: Abnormal   Collection Time: 07/16/15  4:31 PM  Result Value Ref Range   Glucose-Capillary 139 (H) 65 - 99 mg/dL  Glucose, capillary     Status: Abnormal   Collection Time: 07/16/15 10:05 PM  Result Value Ref Range   Glucose-Capillary 114 (H) 65 - 99 mg/dL   Comment 1 Notify RN    Comment 2 Document in Chart   Glucose, capillary     Status: Abnormal   Collection Time: 07/17/15  6:18 AM  Result Value Ref  Range   Glucose-Capillary 116 (H) 65 - 99 mg/dL   Comment 1 Notify RN    Comment 2 Document in Chart   Glucose, capillary     Status: Abnormal   Collection Time: 07/17/15 11:56 AM  Result Value Ref Range   Glucose-Capillary 128 (H) 65 - 99 mg/dL   Comment 1 Notify RN    Comment 2 Document in Chart    Ct Head Wo Contrast  07/16/2015  CLINICAL DATA:  Follow-up examination for stroke.  EXAM: CT HEAD WITHOUT CONTRAST TECHNIQUE: Contiguous axial images were obtained from the base of the skull through the vertex without intravenous contrast. COMPARISON:  Prior MRI from 07/15/2015. FINDINGS: Well demarcated hypodensity involving the right cerebellar hemisphere consistent with acute ischemic infarcts. Overall size and distribution stable from previous MRI. No hemorrhagic transformation by CT. There is localized mass effect with partial effacement of the outflow tract of the fourth ventricle. Fourth ventricle still partially effaced as well. No hydrocephalus. No evidence for ventricular trapping. Basilar cisterns remain patent. No herniation through the foramen magnum. Atrophy with chronic microvascular ischemic disease noted. Remote lacunar infarct within the right thalamus. Additional small remote lacunar infarct within the left basal ganglia. Intracranial atherosclerosis again noted. No other acute large vessel territory infarct. No intracranial hemorrhage. No mass lesion. No extra-axial fluid collection. Scalp soft tissues within normal limits. No acute abnormality about the orbits. Patient is status post cataract extraction. Paranasal sinuses are clear.  No mastoid effusion. Calvarium intact. IMPRESSION: 1. Evolving acute ischemic right cerebellar infarct, stable in size and distribution relative to prior MRI. No CT evidence for hemorrhagic transformation. Mild localized edema with mass effect on the fourth ventricular outflow tract. Fourth ventricle itself remains patent. No hydrocephalus at this time. 2. No  other new acute intracranial process. Electronically Signed   By: Jeannine Boga M.D.   On: 07/16/2015 06:52       Medical Problem List and Plan: 1.  Gait ataxia/dysmetria secondary to right cerebellar infarct. Follow-up cranial CT scan 07/18/2015 2.  DVT Prophylaxis/Anticoagulation: SCDs. Monitor for any signs of DVT 3. Pain Management: Tylenol as needed 4. Mood: Xanax 0.5 mg bedtime as needed 5. Neuropsych: This patient is capable of making decisions on his own behalf. 6. Skin/Wound Care: Routine skin checks 7. Fluids/Electrolytes/Nutrition: Routine I&O's with follow-up chemistries 8. Hypertension. Toprol 25 mg daily. Monitor with increased mobility 9. CAD with remote MI. No chest pain or shortness of breath. Continue aspirin and Plavix 10. COPD with remote tobacco abuse. Continue Dulera twice daily 11. Hyperlipidemia. Lipitor  Post Admission Physician Evaluation: 1. Functional deficits secondary  to right cerebellar infarct. 2. Patient is admitted to receive collaborative, interdisciplinary care between the physiatrist, rehab nursing staff, and therapy team. 3. Patient's level of medical complexity and substantial therapy needs in context of that medical necessity cannot be provided at a lesser intensity of care such as a SNF. 4. Patient has experienced substantial functional loss from his/her baseline which was documented above under the "Functional History" and "Functional Status" headings.  Judging by the patient's diagnosis, physical exam, and functional history, the patient has potential for functional progress which will result in measurable gains while on inpatient rehab.  These gains will be of substantial and practical use upon discharge  in facilitating mobility and self-care at the household level. 5. Physiatrist will provide 24 hour management of medical needs as well as oversight of the therapy plan/treatment and provide guidance as appropriate regarding the interaction  of the two. 6. 24 hour rehab nursing will assist with bladder management, bowel management, safety, skin/wound care, disease management, medication administration, pain management and patient education  and help integrate therapy concepts, techniques,education, etc. 7. PT will assess and treat for/with: Lower extremity strength, range of motion, stamina, balance, functional mobility, safety, adaptive techniques and equipment, NMR, vestibular assessment and treatment, education, community reintegration, ego support.   Goals are: mod I to supervision. 8. OT will assess and treat for/with: ADL's, functional mobility, safety, upper extremity strength,  adaptive techniques and equipment, NMR, vestibular rx/compensation, ego support, education.   Goals are: mod I to supervision. Therapy may proceed with showering this patient. 9. SLP will assess and treat for/with: n/a.  Goals are: n/a. 10. Case Management and Social Worker will assess and treat for psychological issues and discharge planning. 11. Team conference will be held weekly to assess progress toward goals and to determine barriers to discharge. 12. Patient will receive at least 3 hours of therapy per day at least 5 days per week. 13. ELOS: 9-13 days       14. Prognosis:  excellent     Meredith Staggers, MD, [place] [facility] 07/17/2015   07/17/2015

## 2015-07-17 NOTE — Care Management Note (Signed)
Case Management Note  Patient Details  Name: Martin [name] MRN: [id number] Date of Birth: 1930/07/28  Subjective/Objective:                    Action/Plan: Pt discharging to CIR today. No further needs per CM.   Expected Discharge Date:  07/18/15               Expected Discharge Plan:  [place]  In-House Referral:     Discharge planning Services     Post Acute Care Choice:    Choice offered to:     DME Arranged:    DME Agency:     HH Arranged:    HH Agency:     Status of Service:  In process, will continue to follow  Medicare Important Message Given:  Yes Date Medicare IM Given:    Medicare IM give by:    Date Additional Medicare IM Given:    Additional Medicare Important Message give by:     If discussed at [place] of Stay Meetings, dates discussed:    Additional Comments:  Pollie Friar, RN 07/17/2015, 3:12 PM

## 2015-07-18 ENCOUNTER — Inpatient Hospital Stay (HOSPITAL_COMMUNITY): Payer: PRIVATE HEALTH INSURANCE | Admitting: Occupational Therapy

## 2015-07-18 ENCOUNTER — Inpatient Hospital Stay (HOSPITAL_COMMUNITY): Payer: PRIVATE HEALTH INSURANCE | Admitting: Physical Therapy

## 2015-07-18 ENCOUNTER — Inpatient Hospital Stay (HOSPITAL_COMMUNITY): Payer: Medicare Other

## 2015-07-18 DIAGNOSIS — N183 Chronic kidney disease, stage 3 (moderate): Secondary | ICD-10-CM

## 2015-07-18 DIAGNOSIS — I10 Essential (primary) hypertension: Secondary | ICD-10-CM | POA: Insufficient documentation

## 2015-07-18 DIAGNOSIS — J449 Chronic obstructive pulmonary disease, unspecified: Secondary | ICD-10-CM

## 2015-07-18 DIAGNOSIS — F411 Generalized anxiety disorder: Secondary | ICD-10-CM

## 2015-07-18 LAB — COMPREHENSIVE METABOLIC PANEL
ALT: 15 U/L — ABNORMAL LOW (ref 17–63)
AST: 23 U/L (ref 15–41)
Albumin: 3.5 g/dL (ref 3.5–5.0)
Alkaline Phosphatase: 61 U/L (ref 38–126)
Anion gap: 11 (ref 5–15)
BILIRUBIN TOTAL: 0.8 mg/dL (ref 0.3–1.2)
BUN: 20 mg/dL (ref 6–20)
CHLORIDE: 103 mmol/L (ref 101–111)
CO2: 26 mmol/L (ref 22–32)
CREATININE: 1.31 mg/dL — AB (ref 0.61–1.24)
Calcium: 9.3 mg/dL (ref 8.9–10.3)
GFR calc non Af Amer: 49 mL/min — ABNORMAL LOW (ref >60)
GFR, EST AFRICAN AMERICAN: 57 mL/min — AB (ref >60)
Glucose, Bld: 130 mg/dL — ABNORMAL HIGH (ref 65–99)
POTASSIUM: 3.9 mmol/L (ref 3.5–5.1)
Sodium: 140 mmol/L (ref 135–145)
TOTAL PROTEIN: 6.7 g/dL (ref 6.5–8.1)

## 2015-07-18 LAB — CBC WITH DIFFERENTIAL/PLATELET
Basophils Absolute: 0 10*3/uL (ref 0.0–0.1)
Basophils Relative: 0 %
EOS PCT: 1 %
Eosinophils Absolute: 0.1 10*3/uL (ref 0.0–0.7)
HEMATOCRIT: 45.6 % (ref 39.0–52.0)
Hemoglobin: 15.1 g/dL (ref 13.0–17.0)
LYMPHS ABS: 1.3 10*3/uL (ref 0.7–4.0)
LYMPHS PCT: 13 %
MCH: 32.2 pg (ref 26.0–34.0)
MCHC: 33.1 g/dL (ref 30.0–36.0)
MCV: 97.2 fL (ref 78.0–100.0)
MONO ABS: 0.8 10*3/uL (ref 0.1–1.0)
MONOS PCT: 8 %
NEUTROS ABS: 7.5 10*3/uL (ref 1.7–7.7)
Neutrophils Relative %: 78 %
PLATELETS: 154 10*3/uL (ref 150–400)
RBC: 4.69 MIL/uL (ref 4.22–5.81)
RDW: 12.7 % (ref 11.5–15.5)
WBC: 9.6 10*3/uL (ref 4.0–10.5)

## 2015-07-18 LAB — GLUCOSE, CAPILLARY: GLUCOSE-CAPILLARY: 122 mg/dL — AB (ref 65–99)

## 2015-07-18 MED ORDER — ENSURE ENLIVE PO LIQD
237.0000 mL | Freq: Two times a day (BID) | ORAL | Status: DC
  Administered 2015-07-19 – 2015-07-25 (×11): 237 mL via ORAL

## 2015-07-18 NOTE — IPOC Note (Signed)
Overall Plan of Care [facility]) Patient Details Name: ASAAD GULLEY MRN: [id number] DOB: 16-Dec-1930  Admitting Diagnosis: CVA  Hospital Problems: Principal Problem:   Cerebellar infarct [facility]) Active Problems:   Essential hypertension   Anxiety state     Functional Problem List: Nursing Bladder, Bowel, Medication Management, Nutrition, Pain, Perception, Safety, Skin Integrity  PT Balance, Endurance, Motor  OT Balance, Endurance, Motor, Safety, Pain  SLP    TR         Basic ADL's: OT Grooming, Bathing, Dressing, Toileting     Advanced  ADL's: OT Simple Meal Preparation, Laundry, Light Housekeeping     Transfers: PT Bed Mobility, Bed to Chair, Car, Furniture, Floor  OT Toilet, Tub/Shower     Locomotion: PT Stairs, Ambulation     Additional Impairments: OT None  SLP        TR      Anticipated Outcomes Item Anticipated Outcome  Self Feeding n/a  Swallowing      Basic self-care  mod I overall  Toileting  Mod I overall   Bathroom Transfers mod I overall  Bowel/Bladder  patient will manage bowel/bladder with min assist  Transfers  mod I  Locomotion  mod I  Communication     Cognition     Pain  less than 3  Safety/Judgment  Remain free of infection, skin breakdown, falls   Therapy Plan: PT Intensity: Minimum of 1-2 x/day ,45 to 90 minutes PT Frequency: 5 out of 7 days PT Duration Estimated Length of Stay: 7-10 days OT Intensity: Minimum of 1-2 x/day, 45 to 90 minutes OT Frequency: 5 out of 7 days OT Duration/Estimated Length of Stay: 7-10 days         Team Interventions: Nursing Interventions Patient/Family Education, Disease Management/Prevention, Pain Management, Medication Management, Skin Care/Wound Management, Discharge Planning, Psychosocial Support  PT interventions Ambulation/gait training, DME/adaptive equipment instruction, Neuromuscular re-education, Stair training, UE/LE Strength taining/ROM, UE/LE Coordination activities, Therapeutic  Activities, Balance/vestibular training, Discharge planning, Cognitive remediation/compensation, Functional mobility training, Therapeutic Exercise, Patient/family education  OT Interventions Balance/vestibular training, Community reintegration, Discharge planning, DME/adaptive equipment instruction, Patient/family education, Therapeutic Activities, Therapeutic Exercise, Psychosocial support, UE/LE Strength taining/ROM, Self Care/advanced ADL retraining, Functional mobility training, UE/LE Coordination activities, Pain management  SLP Interventions    TR Interventions    SW/CM Interventions Discharge Planning, Psychosocial Support, Patient/Family Education    Team Discharge Planning: Destination: PT-Home ,OT- Home , SLP-  Projected Follow-up: PT-Home health PT, OT-  24 hour supervision/assistance, Home health OT, SLP-  Projected Equipment Needs: PT-None recommended by PT, OT- To be determined, SLP-  Equipment Details: PT-Pt has RW from previous surgery, OT-  Patient/family involved in discharge planning: PT- Patient,  OT-Patient, SLP-   MD ELOS: 7-10 days. Medical Rehab Prognosis:  Good Assessment: 82 y.o. right handed male coronary artery disease with remote MI, hypertension, COPD with remote tobacco abuse, diabetes mellitus. Independent prior to admission living alone. 2 level home with bedroom upstairs. He has a brother who lives across the street but cannot provide assistance. Admitted 07/15/2015 with double vision and gait disturbance. MRI of the brain shows moderately large acute mid to inferior right cerebellar infarct. Remote right thalamic and left basal ganglia small infarcts. 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 Patient did not receive TPA. Carotid Dopplers with left 40-59% ICA stenosis. EchocardiogramWith ejection fraction of 32% grade 1 diastolic dysfunction . Neurology consulted presently maintained on aspirin and Plavix for CVA  prophylaxis 3 months  then Plavix alone. Repeat CT scan showed focal mass effect but no hydrocephalus. Follow-up CT 07/18/2015 without significant changes. Tolerating a regular diet. Patient with functional deficits with gait and dysmetria. Will set goals for mod I with PT and mod I with OT.  See Team Conference Notes for weekly updates to the plan of care

## 2015-07-18 NOTE — Progress Notes (Signed)
Patient information reviewed and entered into eRehab system by Angas Isabell, RN, CRRN, PPS Coordinator.  Information including medical coding and functional independence measure will be reviewed and updated through discharge.     Per nursing patient was given "Data Collection Information Summary for Patients in Inpatient Rehabilitation Facilities with attached "Privacy Act Statement-Health Care Records" upon admission.  

## 2015-07-18 NOTE — Plan of Care (Signed)
Problem: RH Balance Goal: LTG: Patient will maintain dynamic sitting balance (OT) LTG: Patient will maintain dynamic sitting balance with assistance during activities of daily living (OT)  Downgraded secondary to cognition Goal: LTG Patient will maintain dynamic standing with ADLs (OT) LTG: Patient will maintain dynamic standing balance with assist during activities of daily living (OT)  Downgraded secondary to cognition  Problem: RH Grooming Goal: LTG Patient will perform grooming w/assist,cues/equip (OT) LTG: Patient will perform grooming with assist, with/without cues using equipment (OT)  Downgraded secondary to cognition  Problem: RH Bathing Goal: LTG Patient will bathe with assist, cues/equipment (OT) LTG: Patient will bathe specified number of body parts with assist with/without cues using equipment (position) (OT)  Downgraded secondary to cognition  Problem: RH Dressing Goal: LTG Patient will perform upper body dressing (OT) LTG Patient will perform upper body dressing with assist, with/without cues (OT).  Downgraded secondary to cognition Goal: LTG Patient will perform lower body dressing w/assist (OT) LTG: Patient will perform lower body dressing with assist, with/without cues in positioning using equipment (OT)  Downgraded secondary to cognition  Problem: RH Toileting Goal: LTG Patient will perform toileting w/assist, cues/equip (OT) LTG: Patient will perform toiletiing (clothes management/hygiene) with assist, with/without cues using equipment (OT)  Downgraded secondary to cognition  Problem: RH Simple Meal Prep Goal: LTG Patient will perform simple meal prep w/assist (OT) LTG: Patient will perform simple meal prep with assistance, with/without cues (OT).  Downgraded secondary to cognition  Problem: RH Laundry Goal: LTG Patient will perform laundry w/assist, cues (OT) LTG: Patient will perform laundry with assistance, with/without cues (OT).  Downgraded secondary to  cognition  Problem: RH Light Housekeeping Goal: LTG Patient will perform light housekeeping w/assist (OT) LTG: Patient will perform light housekeeping with assistance, with/without cues (OT).  Downgraded secondary to cognition  Problem: RH Toilet Transfers Goal: LTG Patient will perform toilet transfers w/assist (OT) LTG: Patient will perform toilet transfers with assist, with/without cues using equipment (OT)  Downgraded secondary to cognition  Problem: RH Tub/Shower Transfers Goal: LTG Patient will perform tub/shower transfers w/assist (OT) LTG: Patient will perform tub/shower transfers with assist, with/without cues using equipment (OT)  Downgraded secondary to cognition

## 2015-07-18 NOTE — Evaluation (Signed)
Physical Therapy Assessment and Plan  Patient Details  Name: Martin [name] MRN: [id number] Date of Birth: [birth year]  PT Diagnosis: Abnormality of gait, Ataxic gait, Coordination disorder and Dizziness and giddiness Rehab Potential: Good ELOS: 7-10 days   Today's Date: 07/18/2015 PT Individual Time: 0900-1002 PT Individual Time Calculation (min): 62 min    Problem List:  Patient Active Problem List   Diagnosis Date Noted  . Essential hypertension   . Anxiety state   . Cerebellar infarct ([place]) 07/17/2015  . Cerebellar stroke ([place])   . Coronary artery disease involving native coronary artery of native heart without angina pectoris   . Chronic obstructive pulmonary disease ([place])   . CKD (chronic kidney disease)   . Leukocytosis   . Thrombocytopenia ([place])   . Cerebrovascular accident (CVA) ([place])   . Stroke ([place]) 07/15/2015  . CVA (cerebral infarction) 07/15/2015  . Hypothermia 07/15/2015  . Atherosclerotic PVD with intermittent claudication ([place]) 12/29/2013  . Aftercare following surgery of the circulatory system, [place] 12/23/2012  . Atherosclerosis of native arteries of the extremities with intermittent claudication 06/17/2012  . Diabetes mellitus, type 2 ([place]) 06/11/2012  . Peripheral vascular disease, unspecified ([place]) 05/29/2011  . HYPERLIPIDEMIA 08/14/2009  . Hypertension 08/14/2009  . MYOCARDIAL INFARCTION 08/14/2009  . ATRIAL FIBRILLATION 08/14/2009  . COPD 08/14/2009  . TOBACCO ABUSE, HX OF 08/14/2009    Past Medical History:  Past Medical History  Diagnosis Date  . Hypertension   . Hyperlipidemia   . Myocardial infarction [facility])     1978 & 2011  . COPD (chronic obstructive pulmonary disease) ([place])   . Peripheral vascular disease, unspecified ([place])   . GERD (gastroesophageal reflux disease)   . Arthritis   . H/O: GI bleed   . Hiatal hernia   . Diabetes mellitus   . Bronchitis   . Renal artery stenosis [facility])    Past Surgical History:  Past Surgical History   Procedure Laterality Date  . Aortobifemoral bpg  07/22/10    and Right femoral-popliteal BPG    Assessment & Plan Clinical Impression: Martin [name] is a 82 y.o. right handed male coronary artery disease with remote MI, hypertension, COPD with remote tobacco abuse, diabetes mellitus. Independent prior to admission living alone.  Admitted 07/15/2015 with double vision and gait disturbance. MRI of the brain shows moderately large acute mid to inferior right cerebellar infarct. Remote right thalamic and left basal ganglia small infarcts. 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 Patient did not receive TPA. Carotid Dopplers with left 40-59% ICA stenosis. EchocardiogramWith ejection fraction of 62% grade 1 diastolic dysfunction . Neurology consulted presently maintained on aspirin and Plavix for CVA prophylaxis 3 months then Plavix alone. Repeat CT scan showed focal mass effect but no hydrocephalus. Plan a follow-up CT 07/18/2015. Tolerating a regular diet. Patient transferred to CIR on 07/17/2015 .   Patient currently requires mod with mobility secondary to impaired timing and sequencing, unbalanced muscle activation, ataxia and decreased coordination, dizziness and diplopia and decreased standing balance, decreased postural control and decreased balance strategies.  Prior to hospitalization, patient was independent  with mobility and lived with Alone in a House home.  Home access is 4Stairs to enter.  Patient will benefit from skilled PT intervention to maximize safe functional mobility and minimize fall risk for planned discharge home with intermittent assist.  Anticipate patient will benefit from follow up [facility] at discharge.  PT - End of Session Activity Tolerance: Tolerates 30+  min activity with multiple rests Endurance Deficit: Yes PT Assessment Rehab Potential (ACUTE/IP ONLY): Good Barriers to Discharge: Decreased caregiver support PT Patient  demonstrates impairments in the following area(s): Balance;Endurance;Motor PT Transfers Functional Problem(s): Bed Mobility;Bed to Chair;Car;Furniture;Floor PT Locomotion Functional Problem(s): Stairs;Ambulation PT Plan PT Intensity: Minimum of 1-2 x/day ,45 to 90 minutes PT Frequency: 5 out of 7 days PT Duration Estimated Length of Stay: 7-10 days PT Treatment/Interventions: Ambulation/gait training;DME/adaptive equipment instruction;Neuromuscular re-education;Stair training;UE/LE Strength taining/ROM;UE/LE Coordination activities;Therapeutic Activities;Balance/vestibular training;Discharge planning;Cognitive remediation/compensation;Functional mobility training;Therapeutic Exercise;Patient/family education PT Transfers Anticipated Outcome(s): mod I PT Locomotion Anticipated Outcome(s): mod I PT Recommendation Follow Up Recommendations: Home health PT Patient destination: Home Equipment Recommended: None recommended by PT Equipment Details: Pt has RW from previous surgery  Skilled Therapeutic Intervention Pt received resting in bed with no c/o pain and agreeable to therapy session.  Skilled intervention initiated after completion of evaluation.  PT provided pt education on role of PT, goals, ELOS, and plan of care.  Pt performs transfers throughout session with close supervision>steady assist and ambulates with mod assist due to R lean that progresses with fatigue.  Pt somewhat able to correct midline with verbal cues.  Pt demos impulsivity with mobility.  Pt returned to room at end of session and positioned in recliner with call bell in reach, RN present in room for med distribution.    PT Evaluation Precautions/Restrictions Precautions Precautions: Fall Precaution Comments: ataxia, R lean Restrictions Weight Bearing Restrictions: No Home Living/Prior Functioning Home Living Available Help at Discharge: Family;Friend(s);Available PRN/intermittently (has people next door and within the  neighborhood who are able to check in daily) Type of Home: House Home Access: Stairs to enter [employer] of Steps: 4 Entrance Stairs-Rails: Right Home Layout: Two level Alternate Level Stairs-Number of Steps: 1 flight upstairs to bedroom as his clothing items are up there Alternate Level Stairs-Rails: Right Bathroom Shower/Tub: Multimedia programmer: Standard Bathroom Accessibility: Yes Additional Comments: pt has handicap shower with built in seat and gra bars  Lives With: Alone Prior Function Level of Independence: Independent with gait;Independent with transfers  Able to Take Stairs?: Yes Driving: Yes Vocation: Retired Leisure: Hobbies-yes (Comment) Comments: Cooks, cleans, drives. Likes to play golf Vision/Perception  Vision - Assessment Eye Alignment: Within Functional Limits Ocular Range of Motion: Within Functional Limits Alignment/Gaze Preference: Within Defined Limits Tracking/Visual Pursuits: Decreased smoothness of eye movement to RIGHT superior field Saccades: Within functional limits Convergence: Within functional limits Diplopia Assessment: Other (comment) (none reported this session) Additional Comments: pt reports eye fatigue  Cognition Overall Cognitive Status: Within Functional Limits for tasks assessed Arousal/Alertness: Awake/alert Orientation Level: Oriented X4 Memory: Appears intact Awareness: Impaired Awareness Impairment: Anticipatory impairment Problem Solving: Appears intact Safety/Judgment: Appears intact Sensation Sensation Light Touch: Appears Intact (reports some tingling from arterial bypass surgery in ~2011) Coordination Gross Motor Movements are Fluid and Coordinated: Yes Fine Motor Movements are Fluid and Coordinated: Yes (finger opposition normal) Finger Nose Finger Test: normal Heel Shin Test: decreased ROM 2/2 pain from previous surgery, but R=L Motor  Motor Motor: Ataxia Motor - Skilled Clinical Observations:  R lean in standing  Mobility Bed Mobility Bed Mobility: Supine to Sit;Sit to Supine Supine to Sit: 5: Supervision Sit to Supine: 5: Supervision Transfers Transfers: Yes Sit to Stand: 4: Min guard Stand to Sit: 4: Min guard Locomotion  Ambulation Ambulation: Yes Ambulation/Gait Assistance: 3: Mod assist Ambulation Distance (Feet): 200 Feet Assistive device: Rolling walker Ambulation/Gait Assistance Details: Manual facilitation for placement;Tactile cues for weight shifting;Tactile  cues for sequencing Ambulation/Gait Assistance Details: assistance to maintain midline 2/2 R lean Gait Gait: Yes Gait Pattern: Impaired Gait Pattern: Step-through pattern;Ataxic Gait velocity: decreased  Stairs / Additional Locomotion Stairs: Yes Stairs Assistance: 4: Min assist Stair Management Technique: Two rails Number of Stairs: 8 Height of Stairs: 6 Ramp: 3: Mod assist Wheelchair Mobility Wheelchair Mobility: No  Trunk/Postural Assessment  Cervical Assessment Cervical Assessment: Within Functional Limits Thoracic Assessment Thoracic Assessment: Within Functional Limits Lumbar Assessment Lumbar Assessment: Within Functional Limits Postural Control Postural Control: Deficits on evaluation Protective Responses: delayed  Balance Balance Balance Assessed: Yes Dynamic Sitting Balance Dynamic Sitting - Balance Support: Right upper extremity supported;Left upper extremity supported;During functional activity Dynamic Sitting - Level of Assistance: 5: Stand by assistance Static Standing Balance Static Standing - Balance Support: Bilateral upper extremity supported Static Standing - Level of Assistance: 4: Min assist Dynamic Standing Balance Dynamic Standing - Balance Support: Right upper extremity supported;Left upper extremity supported;During functional activity Dynamic Standing - Level of Assistance: 3: Mod assist Extremity Assessment      RLE Assessment RLE Assessment: Within  Functional Limits RLE AROM (degrees) RLE Overall AROM Comments: WFL assessed in sitting RLE Strength Right Hip Flexion: 4+/5 Right Knee Flexion: 5/5 Right Knee Extension: 5/5 Right Ankle Dorsiflexion: 5/5 Right Ankle Plantar Flexion: 5/5 LLE Assessment LLE Assessment: Within Functional Limits LLE AROM (degrees) LLE Overall AROM Comments: WFL assessed in sitting LLE Strength Left Hip Flexion: 4+/5 Left Knee Flexion: 5/5 Left Knee Extension: 5/5 Left Ankle Dorsiflexion: 5/5 Left Ankle Plantar Flexion: 5/5   See Function Navigator for Current Functional Status.   Refer to Care Plan for Long Term Goals  Recommendations for other services: None  Discharge Criteria: Patient will be discharged from PT if patient refuses treatment 3 consecutive times without medical reason, if treatment goals not met, if there is a change in medical status, if patient makes no progress towards goals or if patient is discharged from hospital.  The above assessment, treatment plan, treatment alternatives and goals were discussed and mutually agreed upon: by patient  Urban Gibson E Penven-Crew 07/18/2015, 12:10 PM

## 2015-07-18 NOTE — Progress Notes (Signed)
Initial Nutrition Assessment  DOCUMENTATION CODES:   Not applicable  INTERVENTION:  -Ensure Enlive BID. Each supplement provides 350 kcals and 20 grams of protein.  NUTRITION DIAGNOSIS:   Increased nutrient needs related to chronic illness (COPD) as evidenced by estimated needs.  GOAL:   Patient will meet greater than or equal to 90% of their needs  MONITOR:   PO intake, Supplement acceptance, Labs, Weight trends, Skin, I & O's  REASON FOR ASSESSMENT:   Malnutrition Screening Tool    ASSESSMENT:   82 y.o. right handed male coronary artery disease with remote MI, hypertension, COPD with remote tobacco abuse, diabetes mellitus. Independent prior to admission living alone. 2 level home with bedroom upstairs. He has a brother who lives across the street but cannot provide assistance. Admitted 07/15/2015 with double vision and gait disturbance. MRI of the brain shows moderately large acute mid to inferior right cerebellar infarct. Remote right thalamic and left basal ganglia small infarcts. 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 Patient did not receive TPA. Carotid Dopplers with left 40-59% ICA stenosis. EchocardiogramWith ejection fraction of 74% grade 1 diastolic dysfunction . Neurology consulted presently maintained on aspirin and Plavix for CVA prophylaxis 3 months then Plavix alone. Repeat CT scan showed focal mass effect but no hydrocephalus. Plan a follow-up CT 07/18/2015. Tolerating a regular diet. PhysicalAnd occupational therapy evaluations completed 07/16/2015 with recommendations of physical medicine rehabilitation consult.Patient was admitted for a comprehensive rehabilitation program  Pt weight has been stable since 01/04/2015. Pt reports eating BID and denies N/V and abdominal pain. Pt had a good appetite PTA. Pt reports having a poor appetite since admission. Per chart, meal completion is 100%. Pt is amenable to  drinking Ensure. Will order BID to supplement diet since appetite is poor.  NFPE: no fat depletion, mild muscle depletion, no edema.  Labs reviewed; creat 1.21, GFR 49, CBGs 114-139. Meds reviewed.  Diet Order:  Diet Heart Room service appropriate?: Yes; Fluid consistency:: Thin  Skin:  Reviewed, no issues  Last BM:  5/6  Height:   Ht Readings from Last 1 Encounters:  07/15/15 '5\' 6"'$  (1.676 m)    Weight:   Wt Readings from Last 1 Encounters:  07/18/15 143 lb 8.3 oz (65.1 kg)    Ideal Body Weight:  64.5 kg  BMI:  Body mass index is 23.18 kg/(m^2).  Estimated Nutritional Needs:   Kcal:  9449-6759  Protein:  80-95 grams  Fluid:  1.6-1.8L  EDUCATION NEEDS:   No education needs identified at this time  Geoffery Lyons, [place] Dietetic Intern Pager [phone]  ____________________________________  RD read, reviewed, and agrees with student dietitian's note. Appropriate changes have been made.   Corrin Parker, MS, RD, LDN Pager # [phone] After hours/ weekend pager # [phone]

## 2015-07-18 NOTE — Evaluation (Signed)
Occupational Therapy Assessment and Plan  Patient Details  Name: Martin [name] MRN: [id number] Date of Birth: May 25, 1930  OT Diagnosis: ataxia and muscle weakness (generalized) Rehab Potential: Rehab Potential (ACUTE ONLY): Fair ELOS: 7-10 days   Today's Date: 07/18/2015 OT Individual Time: 7425-9563 and 1300-1430 OT Individual Time Calculation (min): 57 min   And 90 min   Problem List:  Patient Active Problem List   Diagnosis Date Noted  . Essential hypertension   . Anxiety state   . Cerebellar infarct ([place]) 07/17/2015  . Cerebellar stroke ([place])   . Coronary artery disease involving native coronary artery of native heart without angina pectoris   . Chronic obstructive pulmonary disease ([place])   . CKD (chronic kidney disease)   . Leukocytosis   . Thrombocytopenia ([place])   . Cerebrovascular accident (CVA) ([place])   . Stroke ([place]) 07/15/2015  . CVA (cerebral infarction) 07/15/2015  . Hypothermia 07/15/2015  . Atherosclerotic PVD with intermittent claudication ([place]) 12/29/2013  . Aftercare following surgery of the circulatory system, [place] 12/23/2012  . Atherosclerosis of native arteries of the extremities with intermittent claudication 06/17/2012  . Diabetes mellitus, type 2 ([place]) 06/11/2012  . Peripheral vascular disease, unspecified ([place]) 05/29/2011  . HYPERLIPIDEMIA 08/14/2009  . Hypertension 08/14/2009  . MYOCARDIAL INFARCTION 08/14/2009  . ATRIAL FIBRILLATION 08/14/2009  . COPD 08/14/2009  . TOBACCO ABUSE, HX OF 08/14/2009    Past Medical History:  Past Medical History  Diagnosis Date  . Hypertension   . Hyperlipidemia   . Myocardial infarction [facility])     1978 & 2011  . COPD (chronic obstructive pulmonary disease) ([place])   . Peripheral vascular disease, unspecified ([place])   . GERD (gastroesophageal reflux disease)   . Arthritis   . H/O: GI bleed   . Hiatal hernia   . Diabetes mellitus   . Bronchitis   . Renal artery stenosis [facility])    Past Surgical History:  Past  Surgical History  Procedure Laterality Date  . Aortobifemoral bpg  07/22/10    and Right femoral-popliteal BPG    Assessment & Plan Clinical Impression: Patient is a 82 y.o. year old male with remote MI, hypertension, COPD with remote tobacco abuse, diabetes mellitus. Independent prior to admission living alone. 2 level home with bedroom upstairs. He has a brother who lives across the street but cannot provide assistance. Admitted 07/15/2015 with double vision and gait disturbance. MRI of the brain shows moderately large acute mid to inferior right cerebellar infarct. Remote right thalamic and left basal ganglia small infarcts. 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 Patient did not receive TPA. Carotid Dopplers with left 40-59% ICA stenosis. EchocardiogramWith ejection fraction of 87% grade 1 diastolic dysfunction . Neurology consulted presently maintained on aspirin and Plavix for CVA prophylaxis 3 months then Plavix alone. Repeat CT scan showed focal mass effect but no hydrocephalus. Plan a follow-up CT 07/18/2015. Tolerating a regular diet. PhysicalAnd occupational therapy evaluations completed 07/16/2015 with recommendations of physical medicine rehabilitation consult.Patient was admitted for a comprehensive rehabilitation program .  Patient transferred to CIR on 07/17/2015 .    Patient currently requires min with basic self-care skills and IADL secondary to muscle weakness, decreased cardiorespiratoy endurance and decreased sitting balance, decreased standing balance, decreased balance strategies and ataxia.  Prior to hospitalization, patient could complete ADLs and IADls with independent .  Patient will benefit from skilled intervention to increase independence with basic self-care skills and increase level of independence with iADL  prior to discharge home independently.  Anticipate patient will require intermittent supervision and follow up  outpatient.  OT - End of Session Activity Tolerance: Decreased this session Endurance Deficit: Yes Endurance Deficit Description: required rest breaks secondary to fatigue OT Assessment Rehab Potential (ACUTE ONLY): Fair Barriers to Discharge: Other (comment) Barriers to Discharge Comments: none known at this time OT Patient demonstrates impairments in the following area(s): Balance;Endurance;Motor;Safety;Pain OT Basic ADL's Functional Problem(s): Grooming;Bathing;Dressing;Toileting OT Advanced ADL's Functional Problem(s): Simple Meal Preparation;Laundry;Light Housekeeping OT Transfers Functional Problem(s): Toilet;Tub/Shower OT Additional Impairment(s): None OT Plan OT Intensity: Minimum of 1-2 x/day, 45 to 90 minutes OT Frequency: 5 out of 7 days OT Duration/Estimated Length of Stay: 7-10 days OT Treatment/Interventions: Balance/vestibular training;Community reintegration;Discharge planning;DME/adaptive equipment instruction;Patient/family education;Therapeutic Activities;Therapeutic Exercise;Psychosocial support;UE/LE Strength taining/ROM;Self Care/advanced ADL retraining;Functional mobility training;UE/LE Coordination activities;Pain management OT Self Feeding Anticipated Outcome(s): n/a OT Basic Self-Care Anticipated Outcome(s): mod I overall OT Toileting Anticipated Outcome(s): Mod I overall OT Bathroom Transfers Anticipated Outcome(s): mod I overall OT Recommendation Patient destination: Home Follow Up Recommendations: 24 hour supervision/assistance;Home health OT Equipment Recommended: To be determined   Skilled Therapeutic Intervention Session 1: Upon entering the room , pt supine in bed with no c/o pain this session. OT educated pt on OT purpose, POC, and goals this session. Pt verbalized understanding. However, he did refuse bathing and dressing this session. Pt ambulated with min  A and use of RW short distance to sit in chair at sink for grooming tasks. Pt performed  grooming tasks with set up A while seated at sink. Pt then ambulating 37' to dayroom with mod A as pt has strong R lateral lean with ambulation. Pt taking seated rest break and then returning to room in same manner. Pt returned to supine at end of session with call bell and all needed items within reach upon exiting the room.   Session 2: Upon entering the room, pt seated in wheelchair and awaiting therapist. Pt ambulated with RW and mod A to bathroom for toileting. Pt needing steady assist for balance during clothing management and hygiene. Pt ambulating 73' with RW before needing rest break and then continuing to ambulate with mod A to ADL apartment with mod A. Pt demonstrated ability for sit <>stand from soft,plush couch with steady assist. OT educated and demonstrate walk in shower transfer with use of threshold and shower seat. Pt returned demonstration with min A for balance. Pt then moving to kitchen when therapist educated pt on safety during kitchen mobility tasks with pt verbalizing understanding. Pt and OT planned cooking task for further session. Pt ambulating back to room in same manner as above with RW. Once in room, pt performed B UE strengthening exercises with orange, level 2 theraband. Pt performed 2 sets of 15 alternating punches, shoulder diagonals, and shoulder elevation. Pt remained in recliner chair with all needs within reach upon exiting the room.   OT Evaluation Precautions/Restrictions  Precautions Precautions: Fall Precaution Comments: ataxia  Pain Pain Assessment Pain Assessment: No/denies pain Home Living/Prior Functioning Home Living Family/patient expects to be discharged to:: Private residence Living Arrangements: Alone Available Help at Discharge: Family Type of Home: House Home Access: Stairs to enter Technical brewer of Steps: 4 Entrance Stairs-Rails: Right Home Layout: Two level, Able to live on main level with bedroom/bathroom Alternate Level  Stairs-Number of Steps: 1 flight upstairs to bedroom as his clothing items are up there Alternate Level Stairs-Rails: Right Bathroom Shower/Tub: Multimedia programmer: Standard Bathroom Accessibility: Yes Additional Comments:  pt has handicap shower with built in seat and gra bars  Lives With: Alone Prior Function Level of Independence: Independent with basic ADLs, Independent with homemaking with ambulation, Independent with homemaking with wheelchair, Independent with gait  Able to Take Stairs?: Yes Driving: Yes Comments: Cooks, cleans, drives. Likes to play golf Vision/Perception  Vision- History Baseline Vision/History: Wears glasses Wears Glasses: Reading only Patient Visual Report: Other (comment);Diplopia (however, no reported diplopia this session) Vision- Assessment Vision Assessment?: Yes Eye Alignment: Within Functional Limits Ocular Range of Motion: Within Functional Limits Alignment/Gaze Preference: Within Defined Limits Tracking/Visual Pursuits: Decreased smoothness of eye movement to RIGHT superior field Saccades: Within functional limits Convergence: Within functional limits Visual Fields: No apparent deficits Diplopia Assessment: Other (comment) (none reported this session) Additional Comments: pt reports eye fatigue  Cognition Overall Cognitive Status: Within Functional Limits for tasks assessed Arousal/Alertness: Awake/alert Orientation Level: Person;Place;Situation Person: Oriented Place: Oriented Situation: Oriented Year: 2017 Month: May Day of Week: Correct Memory: Appears intact Immediate Memory Recall: Sock;Blue;Bed Memory Recall: Sock;Blue;Bed Memory Recall Sock: Without Cue Memory Recall Blue: Without Cue Memory Recall Bed: Without Cue Awareness: Impaired Awareness Impairment: Anticipatory impairment Problem Solving: Appears intact Safety/Judgment: Appears intact Sensation Sensation Light Touch: Appears Intact Stereognosis: Not  tested Hot/Cold: Appears Intact Proprioception: Appears Intact Coordination Gross Motor Movements are Fluid and Coordinated: Yes Fine Motor Movements are Fluid and Coordinated: Yes Finger Nose Finger Test: [facility] Heel Shin Test: decreased ROM 2/2 pain from previous surgery, but R=L Motor  Motor Motor: Ataxia Motor - Skilled Clinical Observations: R lean in standing, generalized weakness Mobility  Bed Mobility Bed Mobility: Supine to Sit;Sit to Supine Supine to Sit: 5: Supervision Sit to Supine: 5: Supervision Transfers Sit to Stand: 4: Min guard Stand to Sit: 4: Min guard  Trunk/Postural Assessment  Cervical Assessment Cervical Assessment: Within Functional Limits Thoracic Assessment Thoracic Assessment: Within Functional Limits Lumbar Assessment Lumbar Assessment: Within Functional Limits Postural Control Postural Control: Deficits on evaluation Protective Responses: delayed  Balance Balance Balance Assessed: Yes Dynamic Sitting Balance Dynamic Sitting - Balance Support: Right upper extremity supported;Left upper extremity supported;During functional activity Dynamic Sitting - Level of Assistance: 5: Stand by assistance Static Standing Balance Static Standing - Balance Support: Bilateral upper extremity supported Static Standing - Level of Assistance: 4: Min assist Dynamic Standing Balance Dynamic Standing - Balance Support: Right upper extremity supported;Left upper extremity supported;During functional activity Dynamic Standing - Level of Assistance: 4: Min assist;3: Mod assist Extremity/Trunk Assessment RUE Assessment RUE Assessment: Within Functional Limits     See Function Navigator for Current Functional Status.   Refer to Care Plan for Long Term Goals  Recommendations for other services: Neuropsych  Discharge Criteria: Patient will be discharged from OT if patient refuses treatment 3 consecutive times without medical reason, if treatment goals not met, if  there is a change in medical status, if patient makes no progress towards goals or if patient is discharged from hospital.  The above assessment, treatment plan, treatment alternatives and goals were discussed and mutually agreed upon: by patient  Phineas Semen 07/18/2015, 9:05 AM

## 2015-07-18 NOTE — Progress Notes (Addendum)
PHYSICAL MEDICINE & REHABILITATION     PROGRESS NOTE  Subjective/Complaints:  Patient seen sitting up at the edge of his bed eating breakfast. He states he didn't sleep well, but that stretches as result of being in the hospital. He is ready for his first in therapies.  ROS: + Nausea. Denies CP, SOB, vomiting, diarrhea.  Objective: Vital Signs: Blood pressure 157/66, pulse 68, temperature 98 F (36.7 C), temperature source Oral, resp. rate 18, weight 65.1 kg (143 lb 8.3 oz), SpO2 99 %. Ct Head Wo Contrast  07/18/2015  CLINICAL DATA:  Cerebellar infarction EXAM: CT HEAD WITHOUT CONTRAST TECHNIQUE: Contiguous axial images were obtained from the base of the skull through the vertex without intravenous contrast. COMPARISON:  07/16/2015 FINDINGS: Expected evolution of the right cerebellar infarction, with more pronounced hypodensity. No evidence of infarct extension. No hemorrhage. No change in the mild mass effect on the fourth ventricular outflow tract. There is mild to moderate generalized atrophy throughout the remainder the brain. There is remote lacunar infarction in the right thalamus. Gray-white differentiation is preserved in both cerebral hemispheres. No midline shift. No bony abnormality is evident. The visible paranasal sinuses and orbits are unremarkable. IMPRESSION: 1. Right cerebellar infarction with no evidence of infarct extension occult and with no hemorrhage. 2. Remote lacunar infarction in the right thalamus. 3. Unchanged mild generalized atrophy and chronic white matter hypodensity consistent with small vessel disease. Electronically Signed   By: Andreas Newport M.D.   On: 07/18/2015 06:23    Recent Labs  07/18/15 0641  WBC 9.6  HGB 15.1  HCT 45.6  PLT 154    Recent Labs  07/18/15 0641  NA 140  K 3.9  CL 103  GLUCOSE 130*  BUN 20  CREATININE 1.31*  CALCIUM 9.3   CBG (last 3)   Recent Labs  07/17/15 1156 07/17/15 1711 07/18/15 0637  GLUCAP 128*  127* 122*    Wt Readings from Last 3 Encounters:  07/18/15 65.1 kg (143 lb 8.3 oz)  07/15/15 66.225 kg (146 lb)  01/04/15 64.864 kg (143 lb)    Physical Exam:  BP 157/66 mmHg  Pulse 68  Temp(Src) 98 F (36.7 C) (Oral)  Resp 18  Wt 65.1 kg (143 lb 8.3 oz)  SpO2 99% Constitutional:  He appears well-developed and well-nourished. NAD. Vital signs reviewed. HENT: Normocephalic and atraumatic.  Eyes: Conjunctivae and EOM are normal.  Cardiovascular: Normal rate and regular rhythm.  Respiratory: Effort normal. No respiratory distress. Mild upper airway congestion  GI: Soft. Bowel sounds are normal. He exhibits no distension.  Musculoskeletal: He exhibits no edema or tenderness.  Neurological: He is alert and oriented.  Speech is clear.  Mild diplopia with confrontation testing.  Motor:  Right upper extremity: 4+/5 proximal to distal Left upper extremity: 5/5 proximal to distal Right lower extremity: 4/5 proximal to distal Left lower extremity: 5/5 proximal to distal  Right sided: No significant ataxia or dysmetria, but with slow and deliberate movements.  Skin: Skin is warm and dry.  Psychiatric: He has a normal mood and affect. His behavior is normal. Thought content normal   Assessment/Plan: 1. Functional deficits secondary to right cerebellar infarct which require 3+ hours per day of interdisciplinary therapy in a comprehensive inpatient rehab setting. Physiatrist is providing close team supervision and 24 hour management of active medical problems listed below. Physiatrist and rehab team continue to assess barriers to discharge/monitor patient progress toward functional and medical goals.  Function:  Bathing Bathing position  Bathing parts      Bathing assist        Upper Body Dressing/Undressing Upper body dressing                    Upper body assist        Lower Body Dressing/Undressing Lower body dressing                                   Lower body assist        Toileting Toileting   Toileting steps completed by patient: Adjust clothing prior to toileting, Performs perineal hygiene, Adjust clothing after toileting   Toileting Assistive Devices: Grab bar or rail  Toileting assist Assist level: Touching or steadying assistance (Pt.75%), More than reasonable time   Transfers Chair/bed transfer   Chair/bed transfer method: Stand pivot Chair/bed transfer assist level: Touching or steadying assistance (Pt > 75%) Chair/bed transfer assistive device: Armrests, Bedrails     Locomotion Ambulation           Wheelchair          Cognition Comprehension Comprehension assist level: Follows complex conversation/direction with extra time/assistive device  Expression Expression assist level: Expresses complex ideas: With no assist  Social Interaction Social Interaction assist level: Interacts appropriately with others - No medications needed.  Problem Solving Problem solving assist level: Solves complex 90% of the time/cues < 10% of the time  Memory Memory assist level: Recognizes or recalls 90% of the time/requires cueing < 10% of the time    Medical Problem List and Plan: 1. Gait ataxia/dysmetria secondary to right cerebellar infarct on 07/15/2015.    Follow-up cranial CT scan 07/18/2015, reviewed, no significant changes  Begin CIR 2. DVT Prophylaxis/Anticoagulation: SCDs. Monitor for any signs of DVT 3. Pain Management: Tylenol as needed 4. Mood: Xanax 0.5 mg bedtime as needed 5. Neuropsych: This patient is capable of making decisions on his own behalf. 6. Skin/Wound Care: Routine skin checks 7. Fluids/Electrolytes/Nutrition: Routine I&O's 8. Hypertension. Toprol 25 mg daily.  Will allow for permissive hypertension for time being, before making further adjustments  Monitor with increased mobility 9. CAD with remote MI. No chest pain or shortness of breath. Continue aspirin and PlavixMonitor with  increased mobility 10. COPD with remote tobacco abuse. Continue Dulera twice daily 11. Hyperlipidemia. Lipitor 12. CKD:  Creatinine 1.31 on 5/10  Will continue to monitor  LOS (Days) 1 A FACE TO FACE EVALUATION WAS PERFORMED  Martin [name] Lorie Phenix 07/18/2015 8:50 AM

## 2015-07-19 ENCOUNTER — Inpatient Hospital Stay (HOSPITAL_COMMUNITY): Payer: PRIVATE HEALTH INSURANCE | Admitting: Occupational Therapy

## 2015-07-19 ENCOUNTER — Encounter (HOSPITAL_COMMUNITY): Payer: PRIVATE HEALTH INSURANCE

## 2015-07-19 ENCOUNTER — Inpatient Hospital Stay (HOSPITAL_COMMUNITY): Payer: Medicare Other | Admitting: Occupational Therapy

## 2015-07-19 ENCOUNTER — Inpatient Hospital Stay (HOSPITAL_COMMUNITY): Payer: PRIVATE HEALTH INSURANCE | Admitting: Physical Therapy

## 2015-07-19 DIAGNOSIS — R110 Nausea: Secondary | ICD-10-CM

## 2015-07-19 DIAGNOSIS — K5901 Slow transit constipation: Secondary | ICD-10-CM | POA: Insufficient documentation

## 2015-07-19 MED ORDER — POLYETHYLENE GLYCOL 3350 17 G PO PACK
17.0000 g | PACK | Freq: Every day | ORAL | Status: DC
  Administered 2015-07-19 – 2015-07-25 (×6): 17 g via ORAL
  Filled 2015-07-19 (×6): qty 1

## 2015-07-19 MED ORDER — SENNOSIDES-DOCUSATE SODIUM 8.6-50 MG PO TABS
1.0000 | ORAL_TABLET | Freq: Every day | ORAL | Status: DC
  Administered 2015-07-20 – 2015-07-25 (×6): 1 via ORAL
  Filled 2015-07-19 (×7): qty 1

## 2015-07-19 NOTE — Patient Care Conference (Signed)
Inpatient RehabilitationTeam Conference and Plan of Care Update Date: 07/18/2015   Time: 2:30 PM    Patient Name: Martin [name]      Medical Record Number: [id number]  Date of Birth: March 30, 1930 Sex: Male         Room/Bed: 4M07C/4M07C-01 Payor Info: Payor: MEDICARE / Plan: MEDICARE PART A AND B / Product Type: *No Product type* /    Admitting Diagnosis: CVA  Admit Date/Time:  07/17/2015  5:50 PM Admission Comments: No comment available   Primary Diagnosis:  Cerebellar infarct [facility]) Principal Problem: Cerebellar infarct [facility])  Patient Active Problem List   Diagnosis Date Noted  . Slow transit constipation   . Nausea without vomiting   . Essential hypertension   . Anxiety state   . Cerebellar infarct ([place]) 07/17/2015  . Cerebellar stroke ([place])   . Coronary artery disease involving native coronary artery of native heart without angina pectoris   . Chronic obstructive pulmonary disease ([place])   . CKD (chronic kidney disease)   . Leukocytosis   . Thrombocytopenia ([place])   . Cerebrovascular accident (CVA) ([place])   . Stroke ([place]) 07/15/2015  . CVA (cerebral infarction) 07/15/2015  . Hypothermia 07/15/2015  . Atherosclerotic PVD with intermittent claudication ([place]) 12/29/2013  . Aftercare following surgery of the circulatory system, [place] 12/23/2012  . Atherosclerosis of native arteries of the extremities with intermittent claudication 06/17/2012  . Diabetes mellitus, type 2 ([place]) 06/11/2012  . Peripheral vascular disease, unspecified ([place]) 05/29/2011  . HYPERLIPIDEMIA 08/14/2009  . Hypertension 08/14/2009  . MYOCARDIAL INFARCTION 08/14/2009  . ATRIAL FIBRILLATION 08/14/2009  . COPD 08/14/2009  . TOBACCO ABUSE, HX OF 08/14/2009    Expected Discharge Date: Expected Discharge Date: 07/26/15  Team Members Present: Physician leading conference: Dr. Delice Lesch Social Worker Present: Lennart Pall, LCSW Nurse Present: Dorien Chihuahua, RN PT Present: Dwyane Dee, PT;Rodney Lajuana Matte,  PT OT Present: Benay Pillow, OT SLP Present: Weston Anna, SLP PPS Coordinator present : Daiva Nakayama, RN, CRRN     Current Status/Progress Goal Weekly Team Focus  Medical   Left-sided weakness, balance/coordination deficits as well as dysarthria secondary to right MCA infarct felt to be embolic secondary to atrial fibrillation off anticoagulation recently for hernia repair  Improve safety, mobility, adjust BP meds after permissive HTN  see above   Bowel/Bladder   continent X2 , last BM 5/6 with PRN senna given 5/9  remain continent X 2 while on rehab  continue toileting patient with appropriate equipment   Swallow/Nutrition/ Hydration             ADL's   min A for sit <>stand and functional transfers, min A for simulated shower transfer, mod A for ambulation with RW, pt refused ADLs on eval   Mod I overall with RW  balance, self care retraining, safety, pt education   Mobility   min guard sit<>stand, mod assist for locomotion 2/2 R lean and ataxia   mod I overall with RW  midline orientation static and dynamic, balance, gait with LRAD, stairs, transfers   Communication             Safety/Cognition/ Behavioral Observations            Pain   no c/o pain  keep pain scale between 0-4  assess & treat pain as patient receives therapy   Skin   small bruise to the left elbow  no new areas of skin breakdown  assess skin for changes    Rehab Goals Patient on  target to meet rehab goals: Yes Rehab Goals Revised: none - pt's first conference *See Care Plan and progress notes for long and short-term goals.  Barriers to Discharge: Permissive HTN, safety, mobility, CKD    Possible Resolutions to Barriers:  Family edu, adjust meds when appropriate    Discharge Planning/Teaching Needs:  Pt plans to either go to his home or go to his sister's home for only a short time prior to returning to his home.  Pt's goals are set for modified independent. Sister would be welcome to come for family  education, as needed.  Team Discussion:  Pt is medically stable except for blood pressure being slightly elevated.  Pt is min - mod assist with ADLs and mobility on evaluation, with modified independent goals.  Pt has good cognition, but is convinced he will be driving.  Pt will need assistance from his family when in the community.  Team recommended neuropsychology and pt was already scheduled for neuropsychology on Thursday, 07-19-15.  Revisions to Treatment Plan:  None    Continued Need for Acute Rehabilitation Level of Care: The patient requires daily medical management by a physician with specialized training in physical medicine and rehabilitation for the following conditions: Daily direction of a multidisciplinary physical rehabilitation program to ensure safe treatment while eliciting the highest outcome that is of practical value to the patient.: Yes Daily medical management of patient stability for increased activity during participation in an intensive rehabilitation regime.: Yes Daily analysis of laboratory values and/or radiology reports with any subsequent need for medication adjustment of medical intervention for : Neurological problems;Renal problems  Kikue Gerhart, Silvestre Mesi 07/19/2015, 1:31 PM

## 2015-07-19 NOTE — Progress Notes (Signed)
Social Work Patient ID: [name], male   DOB: Jan 19, 1931, 82 y.o.   MRN: [id number]   CSW met with pt to update him on team conference discussion and targeted d/c date of 07-26-15.  Pt was pleased to learn that he would be going home in a week.  He told CSW that he would likely go to his sister's home for a couple of weeks to allow her to help him to transition home.  Then later he mentioned maybe trying it at home alone first.  CSW will continue to work with pt to firm up his d/c plan.  Pt will have [facility] for f/u therapies, either way and CSW can order needed DME.  Pt is appreciative of therapists and the progress he's already made.  CSW encouraged pt to be patient with himself, that his body needs time to heal and for him to not get discouraged before he allows himself that time.  This seemed to resonate with pt.  Pt wishes to inform his sister on his own about his targeted d/c date.  CSW will continue to follow and assist as needed.

## 2015-07-19 NOTE — Progress Notes (Signed)
Occupational Therapy Session Note  Patient Details  Name: Martin [name] MRN: [id number] Date of Birth: 1930/08/01  Today's Date: 07/19/2015 OT Individual Time: 4982-6415 OT Individual Time Calculation (min): 60 min    Short Term Goals: Week 1:  OT Short Term Goal 1 (Week 1): STGs=LTGs secondary to short estimated LOS  Skilled Therapeutic Interventions/Progress Updates:  Upon entering the room, pt supine in bed with c/o nausea. Medication given this session by RN and pt agreeable to OT intervention. Pt ambulated with RW to bathroom with min A for safety. Pt transferred into walk in shower with min A for balance with use of grab bar. Pt bathing from shower chair with sit <>stand to wash buttocks with min A for balance with grab bar. Pt ambulated from bathroom to sit on EOB for dressing tasks with min A for balance. Pt required facilitation secondary to R lateral lean with ambulation. Pt seated in recliner chair at end of session with call bell and all needed items within reach upon exiting the room.   Therapy Documentation Precautions:  Precautions Precautions: Fall Precaution Comments: ataxia, R lean Restrictions Weight Bearing Restrictions: No Vital Signs: Therapy Vitals Temp: 98.4 F (36.9 C) Temp Source: Oral Pulse Rate: 60 Resp: 19 BP: (!) 163/66 mmHg Patient Position (if appropriate): Lying Oxygen Therapy SpO2: 93 % O2 Device: Not Delivered  See Function Navigator for Current Functional Status.   Therapy/Group: Individual Therapy  Martin [name] 07/19/2015, 8:46 AM

## 2015-07-19 NOTE — Progress Notes (Signed)
Physical Therapy Session Note  Patient Details  Name: Martin [name] MRN: [id number] Date of Birth: 1931-02-02  Today's Date: 07/19/2015 PT Individual Time: 1345-1415 PT Individual Time Calculation (min): 30 min   Short Term Goals: Week 1:  PT Short Term Goal 1 (Week 1): =LTGs due to ELOS  Skilled Therapeutic Interventions/Progress Updates:    Gait training for 175f and 1536fwith RW with min A from PT. Patient noted to have inconsistent step length, and difficulty coordinating steps with turns or obbstacle negotiation. PT provided min cues to improved gait pattern, but only minor adjustments noted following cues from PT.  Stair training for 4 steps x 2 with one set with BUE support and the other with RuE support with ascent and descent. PT was required to provided min A for all stair training as well as min cues for improved step to gait pattern and improved sequencing of movements to decreased dependence on Rail.  Weave through 6 cones with RW x2, min-mod A from PT with noted decreased safety in turns due to scissoring gait with turns and poor AD management. Patient was able to make minor corrections to improve gait pattern and decrease speed with turns, but was still impulsive with turns and AD management.   Patient returned to room and left supine in bed with call bell within reach and bed alarm on.   Therapy Documentation Precautions:  Precautions Precautions: Fall Precaution Comments: ataxia, R lean Restrictions Weight Bearing Restrictions: No General:   Vital Signs: Therapy Vitals Temp: 98.1 F (36.7 C) Temp Source: Oral Pulse Rate: 71 Resp: 18 BP: (!) 149/57 mmHg Patient Position (if appropriate): Lying Oxygen Therapy SpO2: 95 % O2 Device: Not Delivered Pain: Pain Assessment Pain Assessment: 0-10 Pain Score: 1  Pain Location: Foot Pain Descriptors / Indicators: Aching Pain Frequency: Intermittent Pain Onset: Gradual Patients Stated Pain Goal: 1 Pain  Intervention(s): Medication (See eMAR)  See Function Navigator for Current Functional Status.   Therapy/Group: Individual Therapy  AuLorie Phenix/01/2016, 6:06 PM

## 2015-07-19 NOTE — Plan of Care (Signed)
Problem: RH BOWEL ELIMINATION Goal: RH STG MANAGE BOWEL WITH ASSISTANCE STG Manage Bowel with min Assistance.  Outcome: Not Progressing No bm since 5/6 nausea treated with zofran and new order for miralax

## 2015-07-19 NOTE — Progress Notes (Signed)
Physical Therapy Session Note  Patient Details  Name: Martin [name] MRN: [id number] Date of Birth: 08/06/1930  Today's Date: 07/19/2015 PT Individual Time: 0900-1000 PT Individual Time Calculation (min): 60 min   Short Term Goals: Week 1:  PT Short Term Goal 1 (Week 1): =LTGs due to ELOS  Skilled Therapeutic Interventions/Progress Updates:    Pt received resting in recliner, no c/o pain but reports feeling nauseated this AM, agreeable to therapy.  Session focus on NMR, balance, transfers, and gait training.  Gait training [phone] with RW and steady assist to therapy gym with verbal cues for correcting R lean in R stance phase.  Gait training [phone] at end of session with RW and close supervision with noted improved L weight shift during R swing phase.    NMR/balance training focus on increase weight shift L progress to alternating weight shift quickly L and R.  PT instructed pt in activity reaching for clothespins to left and pinning to rod, reaching behind to L to put clothespins back in bin, reaching behind to L to retrieve clothespin and pin back on rod, then reaching with R hand to retrieve clothespin and place in bin behind on R side of patient.  Focus on shifting weight L<>R and forward<>back.  PT instructed pt in alternating toe taps for 2 trials of ~2 minutes focus on progressing from straight line toe taps to crossing midline with min>mod assist for balance.  PT instructed pt in rapid alternating toe taps on 4" box 3 trials of ~2-4 minutes each with min>mod assist for balance.  PT instructed pt in static activity with R foot supported on 4" step while patient matched cards with close supervision.    Pt c/o R ankle pain from previous surgery throughout session.  PT applied kinesiotape for decompression and pain relief, which pt reports was effective. PT educated pt on use of tape and indicators of allergic reaction, in which case he would need to ask for help removing it.  Pt verbalized  understanding.   Pt returned to room at end of session and left upright in chair with call bell in reach and needs met.   Therapy Documentation Precautions:  Precautions Precautions: Fall Precaution Comments: ataxia, R lean Restrictions Weight Bearing Restrictions: No General:   Vital Signs:  Pain:   Mobility:   Locomotion :    Trunk/Postural Assessment :    Balance:   Exercises:   Other Treatments:     See Function Navigator for Current Functional Status.   Therapy/Group: Individual Therapy  Rikayla Demmon E Penven-Crew 07/19/2015, 11:30 AM

## 2015-07-19 NOTE — Progress Notes (Signed)
[facility] PHYSICAL MEDICINE & REHABILITATION     PROGRESS NOTE  Subjective/Complaints:  Patient showering with OT this morning. He notes that he had some nausea overnight. He didn't know to ask for medications to help with nausea. He also notes that he has not had a bowel movement in several days.  ROS: + Constipation. Denies CP, SOB, vomiting, diarrhea.  Objective: Vital Signs: Blood pressure 163/66, pulse 60, temperature 98.4 F (36.9 C), temperature source Oral, resp. rate 19, weight 65.1 kg (143 lb 8.3 oz), SpO2 93 %. Ct Head Wo Contrast  07/18/2015  CLINICAL DATA:  Cerebellar infarction EXAM: CT HEAD WITHOUT CONTRAST TECHNIQUE: Contiguous axial images were obtained from the base of the skull through the vertex without intravenous contrast. COMPARISON:  07/16/2015 FINDINGS: Expected evolution of the right cerebellar infarction, with more pronounced hypodensity. No evidence of infarct extension. No hemorrhage. No change in the mild mass effect on the fourth ventricular outflow tract. There is mild to moderate generalized atrophy throughout the remainder the brain. There is remote lacunar infarction in the right thalamus. Gray-white differentiation is preserved in both cerebral hemispheres. No midline shift. No bony abnormality is evident. The visible paranasal sinuses and orbits are unremarkable. IMPRESSION: 1. Right cerebellar infarction with no evidence of infarct extension occult and with no hemorrhage. 2. Remote lacunar infarction in the right thalamus. 3. Unchanged mild generalized atrophy and chronic white matter hypodensity consistent with small vessel disease. Electronically Signed   By: Andreas Newport M.D.   On: 07/18/2015 06:23    Recent Labs  07/18/15 0641  WBC 9.6  HGB 15.1  HCT 45.6  PLT 154    Recent Labs  07/18/15 0641  NA 140  K 3.9  CL 103  GLUCOSE 130*  BUN 20  CREATININE 1.31*  CALCIUM 9.3   CBG (last 3)   Recent Labs  07/17/15 1156 07/17/15 1711  07/18/15 0637  GLUCAP 128* 127* 122*    Wt Readings from Last 3 Encounters:  07/18/15 65.1 kg (143 lb 8.3 oz)  07/15/15 66.225 kg (146 lb)  01/04/15 64.864 kg (143 lb)    Physical Exam:  BP 163/66 mmHg  Pulse 60  Temp(Src) 98.4 F (36.9 C) (Oral)  Resp 19  Wt 65.1 kg (143 lb 8.3 oz)  SpO2 93% Constitutional:  He appears well-developed and well-nourished. NAD. Vital signs reviewed. HENT: Normocephalic and atraumatic.  Eyes: Conjunctivae and EOM are normal.  Cardiovascular: Normal rate and regular rhythm.  Respiratory: Effort normal. No respiratory distress. Mild upper airway congestion  GI: Soft. Bowel sounds are normal. He exhibits no distension.  Musculoskeletal: He exhibits no edema or tenderness.  Neurological: He is alert and oriented.  Speech is clear.  Motor:  Right upper extremity: 4+/5 proximal to distal Left upper extremity: 5/5 proximal to distal Right lower extremity: 4/5 proximal to distal Left lower extremity: 5/5 proximal to distal  Right sided: No significant ataxia or dysmetria, but with slow and deliberate movements.  Skin: Skin is warm and dry.  Psychiatric: He has a normal mood and affect. His behavior is normal. Thought content normal  Assessment/Plan: 1. Functional deficits secondary to right cerebellar infarct which require 3+ hours per day of interdisciplinary therapy in a comprehensive inpatient rehab setting. Physiatrist is providing close team supervision and 24 hour management of active medical problems listed below. Physiatrist and rehab team continue to assess barriers to discharge/monitor patient progress toward functional and medical goals.  Function:  Bathing Bathing position Bathing activity did not  occur: Refused Position: Production manager parts bathed by patient: Right arm, Left arm, Chest, Abdomen, Front perineal area, Buttocks, Right upper leg, Left upper leg, Right lower leg, Left lower leg Body parts bathed by  helper: Back  Bathing assist Assist Level: Touching or steadying assistance(Pt > 75%)      Upper Body Dressing/Undressing Upper body dressing Upper body dressing/undressing activity did not occur: Refused What is the patient wearing?: Pull over shirt/dress     Pull over shirt/dress - Perfomed by patient: Thread/unthread right sleeve, Thread/unthread left sleeve, Put head through opening, Pull shirt over trunk          Upper body assist Assist Level: Set up   Set up : To obtain clothing/put away  Lower Body Dressing/Undressing Lower body dressing Lower body dressing/undressing activity did not occur: Refused What is the patient wearing?: Non-skid slipper socks, Underwear, Pants Underwear - Performed by patient: Thread/unthread right underwear leg, Thread/unthread left underwear leg, Pull underwear up/down   Pants- Performed by patient: Thread/unthread right pants leg, Thread/unthread left pants leg, Pull pants up/down, Fasten/unfasten pants   Non-skid slipper socks- Performed by patient: Don/doff right sock, Don/doff left sock                    Lower body assist Assist for lower body dressing: Touching or steadying assistance (Pt > 75%)      Toileting Toileting Toileting activity did not occur: Refused Toileting steps completed by patient: Adjust clothing prior to toileting, Performs perineal hygiene, Adjust clothing after toileting   Toileting Assistive Devices: Grab bar or rail  Toileting assist Assist level: Touching or steadying assistance (Pt.75%)   Transfers Chair/bed transfer   Chair/bed transfer method: Stand pivot, Ambulatory Chair/bed transfer assist level: Touching or steadying assistance (Pt > 75%) Chair/bed transfer assistive device: Armrests, Medical sales representative     Max distance: 100 Assist level: Moderate assist (Pt 50 - 74%)   Wheelchair          Cognition Comprehension Comprehension assist level: Follows basic  conversation/direction with no assist  Expression Expression assist level: Expresses basic needs/ideas: With no assist  Social Interaction Social Interaction assist level: Interacts appropriately with others with medication or extra time (anti-anxiety, antidepressant).  Problem Solving Problem solving assist level: Solves basic problems with no assist  Memory Memory assist level: Recognizes or recalls 90% of the time/requires cueing < 10% of the time    Medical Problem List and Plan: 1. Gait ataxia/dysmetria secondary to right cerebellar infarct on 07/15/2015.    Follow-up cranial CT scan 07/18/2015, reviewed, no significant changes  Continue CIR  Nausea: Encouraged patient to ask for when necessary antirheumatic when necessary 2. DVT Prophylaxis/Anticoagulation: SCDs. Monitor for any signs of DVT 3. Pain Management: Tylenol as needed 4. Mood: Xanax 0.5 mg bedtime as needed 5. Neuropsych: This patient is capable of making decisions on his own behalf. 6. Skin/Wound Care: Routine skin checks 7. Fluids/Electrolytes/Nutrition: Routine I&O's 8. Hypertension. Toprol 25 mg daily.  Will allow for permissive hypertension for time being, before making further adjustments  Will consider initiating ACE inhibitor tomorrow  Monitor with increased mobility 9. CAD with remote MI. No chest pain or shortness of breath. Continue aspirin and PlavixMonitor with increased mobility 10. COPD with remote tobacco abuse. Continue Dulera twice daily 11. Hyperlipidemia. Lipitor 12. CKD:  Creatinine 1.31 on 5/10  Will continue to monitor 12. Constipation  Will increase bowel regimen on 5/11  LOS (Days)  2 A FACE TO FACE EVALUATION WAS PERFORMED  Martin [name] Lorie Phenix 07/19/2015 9:39 AM

## 2015-07-19 NOTE — Progress Notes (Signed)
Occupational Therapy Session Note  Patient Details  Name: Martin [name] MRN: [id number] Date of Birth: 30-Jul-1930  Today's Date: 07/19/2015 OT Individual Time: 1430-1455 OT Individual Time Calculation (min): 25 min    Short Term Goals: Week 1:  OT Short Term Goal 1 (Week 1): STGs=LTGs secondary to short estimated LOS  Skilled Therapeutic Interventions/Progress Updates:    1:1 Therapeutic activity: focus on safe functional ambulation with RW with mod VC throughout session to slow his pace and to remain inside RW for safety with obstacle course and in and out of bathroom.  Pt very quick and with a lean to the right requiring min to mod A for balance. Continued to address balance with throwing ball against trampoline for 90 seconds with min A for balance. Perform standing activity on Kinetron focus on core strengthening and stability at midline with tactile and visual feedback with a mirror with and without UE support. Pt reports difficulty with task and exerted a lot of energy but able to perform 2x for 2 min each time with encouragement and min physical support with UE and mod without UE support.  Therapy Documentation Precautions:  Precautions Precautions: Fall Precaution Comments: ataxia, R lean Restrictions Weight Bearing Restrictions: No Pain:  no c/o pain   See Function Navigator for Current Functional Status.   Therapy/Group: Individual Therapy  Willeen Cass [facility] 07/19/2015, 2:56 PM

## 2015-07-20 ENCOUNTER — Inpatient Hospital Stay (HOSPITAL_COMMUNITY): Payer: Medicare Other

## 2015-07-20 ENCOUNTER — Inpatient Hospital Stay (HOSPITAL_COMMUNITY): Payer: PRIVATE HEALTH INSURANCE | Admitting: Physical Therapy

## 2015-07-20 ENCOUNTER — Inpatient Hospital Stay (HOSPITAL_COMMUNITY): Payer: Medicare Other | Admitting: Occupational Therapy

## 2015-07-20 NOTE — Progress Notes (Signed)
Social Work Patient ID: Martin [name], male   DOB: [birth year], 82 y.o.   MRN: [id number]   Lynnda Child, LCSW Social Worker Signed  Patient Care Conference 07/19/2015 12:51 PM    Expand All Collapse All   Inpatient RehabilitationTeam Conference and Plan of Care Update Date: 07/18/2015   Time: 2:30 PM     Patient Name: Martin [name]       Medical Record Number: [id number]  Date of Birth: [birth year] Sex: Male         Room/Bed: 4M07C/4M07C-01 Payor Info: Payor: MEDICARE / Plan: MEDICARE PART A AND B / Product Type: *No Product type* /    Admitting Diagnosis: CVA  Admit Date/Time:  07/17/2015  5:50 PM Admission Comments: No comment available   Primary Diagnosis:  Cerebellar infarct [facility]) Principal Problem: Cerebellar infarct [facility])    Patient Active Problem List     Diagnosis  Date Noted   .  Slow transit constipation     .  Nausea without vomiting     .  Essential hypertension     .  Anxiety state     .  Cerebellar infarct ([place])  07/17/2015   .  Cerebellar stroke ([place])     .  Coronary artery disease involving native coronary artery of native heart without angina pectoris     .  Chronic obstructive pulmonary disease ([place])     .  CKD (chronic kidney disease)     .  Leukocytosis     .  Thrombocytopenia ([place])     .  Cerebrovascular accident (CVA) ([place])     .  Stroke ([place])  07/15/2015   .  CVA (cerebral infarction)  07/15/2015   .  Hypothermia  07/15/2015   .  Atherosclerotic PVD with intermittent claudication ([place])  12/29/2013   .  Aftercare following surgery of the circulatory system, [place]  12/23/2012   .  Atherosclerosis of native arteries of the extremities with intermittent claudication  06/17/2012   .  Diabetes mellitus, type 2 ([place])  06/11/2012   .  Peripheral vascular disease, unspecified ([place])  05/29/2011   .  HYPERLIPIDEMIA  08/14/2009   .  Hypertension  08/14/2009   .  MYOCARDIAL INFARCTION  08/14/2009   .  ATRIAL FIBRILLATION  08/14/2009   .  COPD  08/14/2009   .   TOBACCO ABUSE, HX OF  08/14/2009     Expected Discharge Date: Expected Discharge Date: 07/26/15  Team Members Present: Physician leading conference: Dr. Delice Lesch Social Worker Present: Lennart Pall, LCSW Nurse Present: Dorien Chihuahua, RN PT Present: Dwyane Dee, PT;Rodney Lajuana Matte, PT OT Present: Benay Pillow, OT SLP Present: Weston Anna, SLP PPS Coordinator present : Daiva Nakayama, RN, CRRN        Current Status/Progress  Goal  Weekly Team Focus   Medical     Left-sided weakness, balance/coordination deficits as well as dysarthria secondary to right MCA infarct felt to be embolic secondary to atrial fibrillation off anticoagulation recently for hernia repair   Improve safety, mobility, adjust BP meds after permissive HTN  see above   Bowel/Bladder     continent X2 , last BM 5/6 with PRN senna given 5/9  remain continent X 2 while on rehab   continue toileting patient with appropriate equipment    Swallow/Nutrition/ Hydration               ADL's     min A for sit <>stand and functional transfers, min A  for simulated shower transfer, mod A for ambulation with RW, pt refused ADLs on eval   Mod I overall with RW  balance, self care retraining, safety, pt education    Mobility     min guard sit<>stand, mod assist for locomotion 2/2 R lean and ataxia   mod I overall with RW  midline orientation static and dynamic, balance, gait with LRAD, stairs, transfers   Communication               Safety/Cognition/ Behavioral Observations              Pain     no c/o pain  keep pain scale between 0-4  assess & treat pain as patient receives therapy    Skin     small bruise to the left elbow  no new areas of skin breakdown  assess skin for changes    Rehab Goals Patient on target to meet rehab goals: Yes Rehab Goals Revised: none - pt's first conference *See Care Plan and progress notes for long and short-term goals.    Barriers to Discharge:  Permissive HTN, safety, mobility,  CKD      Possible Resolutions to Barriers:   Family edu, adjust meds when appropriate     Discharge Planning/Teaching Needs:   Pt plans to either go to his home or go to his sister's home for only a short time prior to returning to his home.  Pt's goals are set for modified independent. Sister would be welcome to come for family education, as needed.   Team Discussion:    Pt is medically stable except for blood pressure being slightly elevated.  Pt is min - mod assist with ADLs and mobility on evaluation, with modified independent goals.  Pt has good cognition, but is convinced he will be driving.  Pt will need assistance from his family when in the community.  Team recommended neuropsychology and pt was already scheduled for neuropsychology on Thursday, 07-19-15.   Revisions to Treatment Plan:    None     Continued Need for Acute Rehabilitation Level of Care: The patient requires daily medical management by a physician with specialized training in physical medicine and rehabilitation for the following conditions: Daily direction of a multidisciplinary physical rehabilitation program to ensure safe treatment while eliciting the highest outcome that is of practical value to the patient.: Yes Daily medical management of patient stability for increased activity during participation in an intensive rehabilitation regime.: Yes Daily analysis of laboratory values and/or radiology reports with any subsequent need for medication adjustment of medical intervention for : Neurological problems;Renal problems  Jadene Stemmer, Silvestre Mesi 07/19/2015, 1:31 PM

## 2015-07-20 NOTE — Progress Notes (Signed)
Social Work Patient ID: Martin [name], male   DOB: 1930/08/13, 82 y.o.   MRN: [id number] Met with sister-Edna who was here to observe pt in therapies. She voiced concerns over him going home alone or her being able to have him with her. She lives in a one bedroom apartment And does not feel she can be the caregiver of him. Discussed pt's levels and goal of mod/i level. She feels he should go to a NH for 20 days and then be in better shape before going home. Pt reports he can set up everything on the first floor but does have four steps to get into his home. Sister reports she helps with the care of her sister in-law in [place] and is not there. She will talk with him about going to a NH for a few weeks. Made her aware he will need to agree to this plan, since he is capable of making his own decisions. Will ask Jenny-CSW to contact sister to discuss plan along with pt. Sister reports their Mom was in [facility] before she passed away. Will work on a safe discharge plan.

## 2015-07-20 NOTE — Progress Notes (Addendum)
Physical Therapy Make-up Session Note  Patient Details  Name: Martin [name] MRN: [id number] Date of Birth: 12-22-1930  Today's Date: 07/20/2015 PT Individual Time: 1505-1530 PT Individual Time Calculation (min): 25 min   Short Term Goals: Week 1:  PT Short Term Goal 1 (Week 1): =LTGs due to ELOS  Skilled Therapeutic Interventions/Progress Updates:    Session focused on functional gait with RW with focus on safety with turns and obstacles, and neuro re-ed for balance retraining. Pt close supervision to steady assist with RW for gait with mild LOB to R x 3 during turns. Neuro re-ed using compliant surface, pt instructed in alternating toe taps, heel/toe raises, and forward/lateral/retro stepping without AD. Pt required min assist without use of AD for balance. Transfers with close supervision during session with cues for safety and hand placement.  Therapy Documentation Precautions:  Precautions Precautions: Fall Precaution Comments: ataxia, R lean Restrictions Weight Bearing Restrictions: No   Pain: Discomfort in R ankle. Rest breaks as needed.     See Function Navigator for Current Functional Status.   Therapy/Group: Individual Therapy  Canary Brim [facility] 07/20/2015, 1:32 PM

## 2015-07-20 NOTE — Progress Notes (Signed)
Occupational Therapy Session Note  Patient Details  Name: Martin [name] MRN: [id number] Date of Birth: 13-Oct-1930  Today's Date: 07/20/2015 OT Individual Time: 4035-2481 OT Individual Time Calculation (min): 75 min    Short Term Goals: No short term goals set  Skilled Therapeutic Interventions/Progress Updates:    Pt seen for skilled OT to facilitate dynamic balance and R side trunk control. Pt already dressed and ready for the day. Pt ambulated to gym with RW with min A to support balance and guide hips from leaning R.  In gym pt completed circuit workout of 10 reps of 5 exercises, repeated 3 sets: Sitting on elevated surface with dynamic reach to the R Ball roll (arm on ball) for trunk elongation to the R Sit to stand from low mat with no UE support B ball holds with shoulder flex Standing with touching assist with overhead reaching  Pt needed short breaks after each exercise. He ambulated to the kitchen to practice home exercises of "kitchen counter balance exercises" with side lunges, heel raises, knee lifts, hamstring curls, hip abduction.  Pt rested and then ambulated back to room.  Pt resting in bed with all needs met.  Therapy Documentation Precautions:  Precautions Precautions: Fall Precaution Comments: ataxia, R lean Restrictions Weight Bearing Restrictions: No      Pain: Pain Assessment Pain Assessment: No/denies pain - R ankle pain after therapy, pt declined medication, he wanted to let in rest in bed ADL:   See Function Navigator for Current Functional Status.   Therapy/Group: Individual Therapy  [place] 07/20/2015, 12:25 PM

## 2015-07-20 NOTE — Progress Notes (Signed)
Social Work Assessment and Plan  Patient Details  Name: Martin [name] MRN: [id number] Date of Birth: 1930-04-26  Today's Date: 07/18/2015  Problem List:  Patient Active Problem List   Diagnosis Date Noted  . Slow transit constipation   . Nausea without vomiting   . Essential hypertension   . Anxiety state   . Cerebellar infarct ([place]) 07/17/2015  . Cerebellar stroke ([place])   . Coronary artery disease involving native coronary artery of native heart without angina pectoris   . Chronic obstructive pulmonary disease ([place])   . CKD (chronic kidney disease)   . Leukocytosis   . Thrombocytopenia ([place])   . Cerebrovascular accident (CVA) ([place])   . Stroke ([place]) 07/15/2015  . CVA (cerebral infarction) 07/15/2015  . Hypothermia 07/15/2015  . Atherosclerotic PVD with intermittent claudication ([place]) 12/29/2013  . Aftercare following surgery of the circulatory system, [place] 12/23/2012  . Atherosclerosis of native arteries of the extremities with intermittent claudication 06/17/2012  . Diabetes mellitus, type 2 ([place]) 06/11/2012  . Peripheral vascular disease, unspecified ([place]) 05/29/2011  . HYPERLIPIDEMIA 08/14/2009  . Hypertension 08/14/2009  . MYOCARDIAL INFARCTION 08/14/2009  . ATRIAL FIBRILLATION 08/14/2009  . COPD 08/14/2009  . TOBACCO ABUSE, HX OF 08/14/2009   Past Medical History:  Past Medical History  Diagnosis Date  . Hypertension   . Hyperlipidemia   . Myocardial infarction [facility])     1978 & 2011  . COPD (chronic obstructive pulmonary disease) ([place])   . Peripheral vascular disease, unspecified ([place])   . GERD (gastroesophageal reflux disease)   . Arthritis   . H/O: GI bleed   . Hiatal hernia   . Diabetes mellitus   . Bronchitis   . Renal artery stenosis [facility])    Past Surgical History:  Past Surgical History  Procedure Laterality Date  . Aortobifemoral bpg  07/22/10    and Right femoral-popliteal BPG   Social History:  reports that he quit smoking about 6 years ago.  His smoking use included Cigarettes. He quit after 65 years of use. He has never used smokeless tobacco. He reports that he does not drink alcohol or use illicit drugs.  Family / Support Systems Marital Status: Widow/Widower How Long?: 20+ years Patient Roles: Parent, Other (Comment) (grandfathter; brother) Children: Tod Persia - granddaughter - [phone] Other Supports: Lucita Lora - sister - [phone] Anticipated Caregiver: Parke Simmers can provide supervision if needed in her home per pt Ability/Limitations of Caregiver: Parke Simmers is 82 y/o and very active Caregiver Availability: 24/7 Family Dynamics: Pt has supportive family, but he's not sure if he wants to accept their help at this time.  He's used to being very independent.  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 o Abuse/Neglect Physical Abuse: Denies Verbal Abuse: Denies Sexual Abuse: Denies Exploitation of patient/patient's resources: Denies Self-Neglect: Denies  Emotional Status Pt's affect, behavior and adjustment status: Pt reports feeling okay emotionally, but is still in disbelief that this happened to him, especially with eating right and living right.  Recent Psychosocial Issues: none reported Psychiatric History: none reported Substance Abuse History: none reported  Patient / Family Perceptions, Expectations & Goals Pt/Family understanding of illness & functional limitations: Pt stated that he understands his condition and does not have any unanswered questions at this time. Premorbid pt/family roles/activities: Pt likes  to play golf 3 times a week when the weather turns nice.  Pt cooks, cleans, and drives. Anticipated changes in  roles/activities/participation: Pt hopes to resume his activities as he is able. Pt/family expectations/goals: Pt wants to regain use of his right arm and leg and have his independence back.  Community [employer] Agencies: None Premorbid Home Care/DME Agencies: Other (Comment) (has a walker, shower seat, and grab bars in the shower - agency unknown) Transportation available at discharge: famiy Resource referrals recommended: Neuropsychology, Support group (specify)  Discharge Planning Living Arrangements: Alone Support Systems: Other relatives, Friends/neighbors Type of Residence: Private residence Insurance Resources: [employer], Multimedia programmer (specify) Financial Resources: SSD, Other (Comment) (retirement) Financial Screen Referred: No Living Expenses: Own Money Management: Patient Does the patient have any problems obtaining your medications?: No Home Management: Pt takes cre of the inside of the house, and he hires someone to help with the outside of the home. Patient/Family Preliminary Plans: Pt has the opportunity to go to his sister's home for a week or two, where the home is more accessible and he would have his sister to provide supervision.  Pt has also just considered going home alone.  Now OT has downgrraded goals to supervison. Barriers to Discharge: Steps, Self care Social Work Anticipated Follow Up Needs: HH/OP, Support Group Expected length of stay: ELOS 7-10 days  Clinical Impression CSW met with pt on 07-18-15 to introduce self and role of CSW, as well as to complete assessment.  Pt was open to [place] visit and seemed slightly tearful a couple of times when talking about his family, especially brother who is having some mental health/dementia issues.  Pt is grateful to have not had a worse stroke, yet he was still caught off guard that this even happened to him.  Pt reports feeling okay emotionally.  Pt is still trying to decide if he wants to go to his sister's  home or his own home.  CSW will support him in his discussion and help him prepare for going home by having DME and f/u therapy arranged for him.  CSW will continue to follow and assist as needed.  Jermy Couper, Silvestre Mesi 07/19/2015, 2:05 AM

## 2015-07-20 NOTE — Progress Notes (Signed)
Occupational Therapy Session Note  Patient Details  Name: Martin [name] MRN: [id number] Date of Birth: 1930-11-12  Today's Date: 07/20/2015 OT Individual Time: 1415-1540  OT Individual Time Calculation (min): 85 min   Short Term Goals: Week 1:  OT Short Term Goal 1 (Week 1): STGs=LTGs secondary to short estimated LOS  Skilled Therapeutic Interventions/Progress Updates:    Pt lying in bed. Arose to EOB with no assistance; Ambulated to bathroom with RW.  STood to urinate wtihSBA.   Ambulated to sink and washed hands.  Ambulated to gym and engaged  In balance exercises with big ball and mat with trunk strengthening and UE motor control.  Pt demonstrated fair balance reactions with ball in minimal challenging activities.   He goes fast with movements and has hard time going slow and with control  Practiced stepping numbers with numbers placed close together to enable pt more control.  Did UE AROM and muscle energy exercises with cone staking , peg placement and PNF patterns.  Pt ambulated back to room.  Sister, Martin [name] concerned about pt going home alone.  Pt wants to get [facility] care and sister thinking more with NHP.   Pt.left in room with all items in reach.      Therapy Documentation Precautions:  Precautions Precautions: Fall Precaution Comments: ataxia, R lean Restrictions Weight Bearing Restrictions: No General:   Vital Signs: Therapy Vitals Temp: 98 F (36.7 C) Temp Source: Oral Pulse Rate: 71 Resp: 18 BP: (!) 132/56 mmHg Patient Position (if appropriate): Sitting Oxygen Therapy SpO2: 98 % O2 Device: Not Delivered Pain: Pain Assessment Pain Assessment: No/denies pain ADL:   Exercises:   Other Treatments:    See Function Navigator for Current Functional Status.   Therapy/Group: Individual Therapy  Lisa Roca 07/20/2015, 2:20 PM

## 2015-07-20 NOTE — Progress Notes (Signed)
Inpatient [facility] Individual Statement of Services  Patient Name:  Martin [name]  Date:  07/20/2015  Welcome to the [facility].  Our goal is to provide you with an individualized program based on your diagnosis and situation, designed to meet your specific needs.  With this comprehensive rehabilitation program, you will be expected to participate in at least 3 hours of rehabilitation therapies Monday-Friday, with modified therapy programming on the weekends.  Your rehabilitation program will include the following services:  Physical Therapy (PT), Occupational Therapy (OT), 24 hour per day rehabilitation nursing, Neuropsychology, Case Management (Social Worker), Rehabilitation Medicine, Nutrition Services and Pharmacy Services  Weekly team conferences will be held on Wednesdays to discuss your progress.  Your Social Worker will talk with you frequently to get your input and to update you on team discussions.  Team conferences with you and your family in attendance may also be held.  Expected length of stay: 7 to 10 days  Overall anticipated outcome:  Modified Independent for PT with Supervison for OT tasks  Depending on your progress and recovery, your program may change. Your Social Worker will coordinate services and will keep you informed of any changes. Your Social Worker's name and contact numbers are listed  below.  The following services may also be recommended but are not provided by the [facility] will be made to provide these services after discharge if needed.  Arrangements include referral to agencies that provide these services.  Your insurance has been verified to be:  Medicare and secondary Your primary doctor is:  Dr. Jenna Luo  Pertinent information will be shared with your doctor and your insurance  company.  Social Worker:  Alfonse Alpers, LCSW  [phone] or [phone]  Information discussed with and copy given to patient by: Trey Sailors, 07/20/2015, 1:35 AM

## 2015-07-20 NOTE — Consult Note (Signed)
NEUROCOGNITIVE STATUS EXAMINATION - [place]   Mr. Martin [name] is an 82 year old man, who was seen for a brief neurocognitive status examination to evaluate his emotional status and cognitive functioning in the setting of right cerebellar infarct.    Emotional Functioning:  During the clinical interview, Martin [name] acknowledged a little bit of depressed mood, mostly related to the change in level of independence, as he previously lived and functioned alone and he now requires assistance for basic daily activities.  He also stated that it sometimes keeps him awake at night wondering about whether or not he will physically recover and how long that process may take.  He commented that his appetite is poor, he has low energy, and although he acknowledged anhedonia, he said that he wants to participate in therapies on the unit, because he recognizes that participation in those is the only way to improve his situation.  Martin [name] was provided with an opportunity to express his frustrations; he repeatedly noted irritation at the fact that he had tried to live a healthy lifestyle (e.g. proper diet, exercise, etc.) and he still ended up having a stroke.  He also acknowledged concern about subsequent strokes.  He expressed hope that he will be able to discharge to his home with minimal assistance, as he can already see himself making progress, but he also said that it is early in his recovery and he is unsure how much progress he will make on the unit.  He said that he recognizes that he may need help at first and may have to temporarily stay with his sister or have some other discharge plan in place.  He commented that he will take things one day at a time until his discharge plan becomes clearer.  Although Martin [name] reported some symptoms of depression, he denied suicidal ideation.  He said that he went through a depressive episode immediately following cardiac surgery in the  past, which he said resolved as soon as he was physically healed; he expects his current low mood to resolve as he physically heals in this situation as well.  In the meantime, he said that he is coping by trying to distract himself (e.g. watching television).  He will also talk about his frustrations with his sister, neighbors, or friends.    Cognitive Functioning:  Martin [name] total score on a very brief measure of mental status was not suggestive of the presence of cognitive disruption at the level of dementia (MMSE-2 brief = 14/16).  He lost points for misstating the season of the year and for freely recalling only 2 of 3 previously studied words after a brief delay.  Subjectively, he denied cognitive issues.  He was observed to have trouble, at times, comprehending prompts and required the examiner to rephrase questions.     Impressions and Recommendations:  Martin [name] total score on a very brief measure of mental status was not suggestive of the presence of marked cognitive disruption at this time.  However, he did have trouble comprehending prompts from the examiner on occasion.  He was provided with psychoeducation regarding cognitive impact and recovery in cases of stroke and he seemed to understand.  If he notices cognitive disruption post-discharge, he may choose to have a comprehensive neuropsychological evaluation as an outpatient. Contact information for providers in his area should be included in his discharge paperwork for that purpose.  From an emotional standpoint, Martin [name] seems to be experiencing an adjustment disorder with depressed  mood.  He likely does not require treatment with anti-depressants at this time, though if his symptoms progress, medication treatment could be considered.  Follow-up with the neuropsychologist could also be requested should his treatment team feel that it would be beneficial in informing care.    DIAGNOSES: Cerebellar infarct Adjustment disorder with  depressed mood  Marlane Hatcher, Psy.D.  Clinical Neuropsychologist

## 2015-07-20 NOTE — Progress Notes (Signed)
Physical Therapy Session Note  Patient Details  Name: Martin [name] MRN: [id number] Date of Birth: 1931/03/10  Today's Date: 07/20/2015 PT Individual Time: 1100-1200 PT Individual Time Calculation (min): 60 min   Short Term Goals: Week 1:  PT Short Term Goal 1 (Week 1): =LTGs due to ELOS  Skilled Therapeutic Interventions/Progress Updates:    Pt received sleeping in bed, easily awakes to name, no c/o pain and agreeable to therapy session.  Session focus on NMR, balance, gait, and activity tolerance.   Gait training to and from therapy gym with RW and close supervision with noted LOB to R during turns, changes in speed, or stopping which required supervision>light steady assist to recover.    PT instructed pt in floor transfer x1 with supervision and verbal cues.  PT provided education on signs for safe transfer (i.e. No bleeding, no broken bones, remember falling, etc) and when to call for help.  Pt verbalized understanding.    PT instructed pt in D1/D2 AROM patterns 2x10 +5 reps bilaterally with max multimodal cues for smoothness of movement, speed, and form.  PT instructed pt in 2x15 bridges with ball between knees to facilitate increased awareness of pelvic stability during movement.  PT instructed pt in side stepping R and L with supervision and BUE support on rail with verbal cues for correct form with toes pointing forward to isolate hip abductors.  PT instructed pt in backwards walking with steady assist and verbal cues for progressing hand with steps.  PT instructed pt in 2x25' of amb with RW focus on changing direction weaving "sine curve" along green tiles in hallway with close supervision and light steady assist during turn around.    Pt returned to room at end of session and positioned sitting EOB with call bell in reach and needs met.    Therapy Documentation Precautions:  Precautions Precautions: Fall Precaution Comments: ataxia, R lean Restrictions Weight Bearing  Restrictions: No   See Function Navigator for Current Functional Status.   Therapy/Group: Individual Therapy  Earnest Conroy [place] 07/20/2015, 12:18 PM

## 2015-07-20 NOTE — Progress Notes (Signed)
[facility] PHYSICAL MEDICINE & REHABILITATION     PROGRESS NOTE  Subjective/Complaints:  Pt laying in bed this AM.  He is in good spirits and states he is sleeping fairly.  He is enjoying therapies. He also notes resolution of diarrhea and mild nausea.  ROS: + mild nausea. Denies CP, SOB, vomiting, diarrhea.  Objective: Vital Signs: Blood pressure 125/70, pulse 70, temperature 98.3 F (36.8 C), temperature source Oral, resp. rate 16, weight 65.1 kg (143 lb 8.3 oz), SpO2 96 %. No results found.  Recent Labs  07/18/15 0641  WBC 9.6  HGB 15.1  HCT 45.6  PLT 154    Recent Labs  07/18/15 0641  NA 140  K 3.9  CL 103  GLUCOSE 130*  BUN 20  CREATININE 1.31*  CALCIUM 9.3   CBG (last 3)   Recent Labs  07/17/15 1156 07/17/15 1711 07/18/15 0637  GLUCAP 128* 127* 122*    Wt Readings from Last 3 Encounters:  07/18/15 65.1 kg (143 lb 8.3 oz)  07/15/15 66.225 kg (146 lb)  01/04/15 64.864 kg (143 lb)    Physical Exam:  BP 125/70 mmHg  Pulse 70  Temp(Src) 98.3 F (36.8 C) (Oral)  Resp 16  Wt 65.1 kg (143 lb 8.3 oz)  SpO2 96% Constitutional:  He appears well-developed and well-nourished. NAD. Vital signs reviewed. HENT: Normocephalic and atraumatic.  Eyes: Conjunctivae and EOM are normal.  Cardiovascular: Normal rate and regular rhythm.  Respiratory: Effort normal. No respiratory distress.  GI: Soft. Bowel sounds are normal. He exhibits no distension.  Musculoskeletal: He exhibits no edema or tenderness.  Neurological: He is alert and oriented.  Speech is clear.  Motor:  Right upper extremity: 4+/5 proximal to distal Left upper extremity: 5/5 proximal to distal Right lower extremity: 4/5 proximal to distal Left lower extremity: 5/5 proximal to distal  Right sided: No significant ataxia or dysmetria, but with slow and deliberate movements.  Skin: Skin is warm and dry.  Psychiatric: He has a normal mood and affect. His behavior is normal. Thought content  normal  Assessment/Plan: 1. Functional deficits secondary to right cerebellar infarct which require 3+ hours per day of interdisciplinary therapy in a comprehensive inpatient rehab setting. Physiatrist is providing close team supervision and 24 hour management of active medical problems listed below. Physiatrist and rehab team continue to assess barriers to discharge/monitor patient progress toward functional and medical goals.  Function:  Bathing Bathing position Bathing activity did not occur: Refused Position: Production manager parts bathed by patient: Right arm, Left arm, Chest, Abdomen, Front perineal area, Buttocks, Right upper leg, Left upper leg, Right lower leg, Left lower leg Body parts bathed by helper: Back  Bathing assist Assist Level: Touching or steadying assistance(Pt > 75%)      Upper Body Dressing/Undressing Upper body dressing Upper body dressing/undressing activity did not occur: Refused What is the patient wearing?: Pull over shirt/dress     Pull over shirt/dress - Perfomed by patient: Thread/unthread right sleeve, Thread/unthread left sleeve, Put head through opening, Pull shirt over trunk          Upper body assist Assist Level: Set up   Set up : To obtain clothing/put away  Lower Body Dressing/Undressing Lower body dressing Lower body dressing/undressing activity did not occur: Refused What is the patient wearing?: Non-skid slipper socks, Underwear, Pants Underwear - Performed by patient: Thread/unthread right underwear leg, Thread/unthread left underwear leg, Pull underwear up/down   Pants- Performed by patient: Thread/unthread right  pants leg, Thread/unthread left pants leg, Pull pants up/down, Fasten/unfasten pants   Non-skid slipper socks- Performed by patient: Don/doff right sock, Don/doff left sock                    Lower body assist Assist for lower body dressing: Touching or steadying assistance (Pt > 75%)       Toileting Toileting Toileting activity did not occur: Refused Toileting steps completed by patient: Adjust clothing prior to toileting, Performs perineal hygiene, Adjust clothing after toileting   Toileting Assistive Devices: Grab bar or rail  Toileting assist Assist level: Touching or steadying assistance (Pt.75%)   Transfers Chair/bed transfer   Chair/bed transfer method: Squat pivot Chair/bed transfer assist level: Supervision or verbal cues Chair/bed transfer assistive device: Armrests     Locomotion Ambulation     Max distance: 150 Assist level: Touching or steadying assistance (Pt > 75%)   Wheelchair          Cognition Comprehension Comprehension assist level: Follows basic conversation/direction with no assist  Expression Expression assist level: Expresses basic needs/ideas: With no assist  Social Interaction Social Interaction assist level: Interacts appropriately with others with medication or extra time (anti-anxiety, antidepressant).  Problem Solving Problem solving assist level: Solves basic problems with no assist  Memory Memory assist level: Recognizes or recalls 90% of the time/requires cueing < 10% of the time    Medical Problem List and Plan: 1. Gait ataxia/dysmetria secondary to right cerebellar infarct on 07/15/2015.    Follow-up cranial CT scan 07/18/2015, reviewed, no significant changes  Continue CIR 2. DVT Prophylaxis/Anticoagulation: SCDs. Monitor for any signs of DVT 3. Pain Management: Tylenol as needed 4. Mood: Xanax 0.5 mg bedtime as needed 5. Neuropsych: This patient is capable of making decisions on his own behalf. 6. Skin/Wound Care: Routine skin checks 7. Fluids/Electrolytes/Nutrition: Routine I&O's 8. Hypertension.   Cont Toprol 25 mg daily.  Blood pressure self-regulating and improving  Will consider initiating medications if necessary  Monitor with increased mobility 9. CAD with remote MI. No chest pain or shortness of breath.  Continue aspirin and PlavixMonitor with increased mobility 10. COPD with remote tobacco abuse. Continue Dulera twice daily 11. Hyperlipidemia. Lipitor 12. CKD:  Creatinine 1.31 on 5/10  Will continue to monitor 12. Constipation  Improving  Increased bowel regimen on 5/11 13. Nausea:   Encouraged patient to ask for when necessary antiemetic when necessary  Improving  LOS (Days) 3 A FACE TO FACE EVALUATION WAS PERFORMED  Ankit Lorie Phenix 07/20/2015 9:10 AM

## 2015-07-21 ENCOUNTER — Inpatient Hospital Stay (HOSPITAL_COMMUNITY): Payer: PRIVATE HEALTH INSURANCE | Admitting: Physical Therapy

## 2015-07-21 ENCOUNTER — Inpatient Hospital Stay (HOSPITAL_COMMUNITY): Payer: Medicare Other | Admitting: Physical Therapy

## 2015-07-21 ENCOUNTER — Inpatient Hospital Stay (HOSPITAL_COMMUNITY): Payer: Medicare Other | Admitting: Occupational Therapy

## 2015-07-21 ENCOUNTER — Inpatient Hospital Stay (HOSPITAL_COMMUNITY): Payer: PRIVATE HEALTH INSURANCE | Admitting: *Deleted

## 2015-07-21 NOTE — Progress Notes (Signed)
[place] PHYSICAL MEDICINE & REHABILITATION     PROGRESS NOTE  Subjective/Complaints:  Lying in bed. Wondering when he will get better control of his right side. Otherwise in good spirits.   ROS: . Denies CP, SOB, vomiting, diarrhea.  Objective: Vital Signs: Blood pressure 146/56, pulse 69, temperature 99.4 F (37.4 C), temperature source Oral, resp. rate 17, weight 65.1 kg (143 lb 8.3 oz), SpO2 95 %. No results found. No results for input(s): WBC, HGB, HCT, PLT in the last 72 hours. No results for input(s): NA, K, CL, GLUCOSE, BUN, CREATININE, CALCIUM in the last 72 hours.  Invalid input(s): CO CBG (last 3)  No results for input(s): GLUCAP in the last 72 hours.  Wt Readings from Last 3 Encounters:  07/18/15 65.1 kg (143 lb 8.3 oz)  07/15/15 66.225 kg (146 lb)  01/04/15 64.864 kg (143 lb)    Physical Exam:  BP 146/56 mmHg  Pulse 69  Temp(Src) 99.4 F (37.4 C) (Oral)  Resp 17  Wt 65.1 kg (143 lb 8.3 oz)  SpO2 95% Constitutional:  He appears well-developed and well-nourished. NAD. Vital signs reviewed. HENT: Normocephalic and atraumatic.  Eyes: Conjunctivae and EOM are normal.  Cardiovascular: Normal rate and regular rhythm.  Respiratory: Effort normal. No respiratory distress.  GI: Soft. Bowel sounds are normal. He exhibits no distension.  Musculoskeletal: He exhibits no edema or tenderness.  Neurological: He is alert and oriented.  Speech is clear.  Motor:  Right upper extremity: 4+/5 proximal to distal Left upper extremity: 5/5 proximal to distal Right lower extremity: 4/5 proximal to distal Left lower extremity: 5/5 proximal to distal  Right sided: No significant ataxia or dysmetria, but with slow and deliberate movements.  Skin: Skin is warm and dry.  Psychiatric: He has a normal mood and affect. His behavior is normal. Thought content normal  Assessment/Plan: 1. Functional deficits secondary to right cerebellar infarct which require 3+ hours per day  of interdisciplinary therapy in a comprehensive inpatient rehab setting. Physiatrist is providing close team supervision and 24 hour management of active medical problems listed below. Physiatrist and rehab team continue to assess barriers to discharge/monitor patient progress toward functional and medical goals.  Function:  Bathing Bathing position Bathing activity did not occur: Refused Position: Production manager parts bathed by patient: Right arm, Left arm, Chest, Abdomen, Front perineal area, Buttocks, Right upper leg, Left upper leg, Right lower leg, Left lower leg Body parts bathed by helper: Back  Bathing assist Assist Level: Touching or steadying assistance(Pt > 75%)      Upper Body Dressing/Undressing Upper body dressing Upper body dressing/undressing activity did not occur: Refused What is the patient wearing?: Pull over shirt/dress     Pull over shirt/dress - Perfomed by patient: Thread/unthread right sleeve, Thread/unthread left sleeve, Put head through opening, Pull shirt over trunk          Upper body assist Assist Level: Set up   Set up : To obtain clothing/put away  Lower Body Dressing/Undressing Lower body dressing Lower body dressing/undressing activity did not occur: Refused What is the patient wearing?: Non-skid slipper socks, Underwear, Pants Underwear - Performed by patient: Thread/unthread right underwear leg, Thread/unthread left underwear leg, Pull underwear up/down   Pants- Performed by patient: Thread/unthread right pants leg, Thread/unthread left pants leg, Pull pants up/down, Fasten/unfasten pants   Non-skid slipper socks- Performed by patient: Don/doff right sock, Don/doff left sock  Lower body assist Assist for lower body dressing: Touching or steadying assistance (Pt > 75%)      Toileting Toileting   Toileting steps completed by patient: Adjust clothing after toileting, Performs perineal hygiene, Adjust  clothing prior to toileting   Toileting Assistive Devices: Grab bar or rail  Toileting assist Assist level: Supervision or verbal cues   Transfers Chair/bed transfer   Chair/bed transfer method: Ambulatory Chair/bed transfer assist level: Supervision or verbal cues Chair/bed transfer assistive device: Armrests, Medical sales representative     Max distance: 150 Assist level: Supervision or verbal cues   Wheelchair          Cognition Comprehension Comprehension assist level: Follows basic conversation/direction with no assist  Expression Expression assist level: Expresses basic needs/ideas: With no assist  Social Interaction Social Interaction assist level: Interacts appropriately with others with medication or extra time (anti-anxiety, antidepressant).  Problem Solving Problem solving assist level: Solves basic problems with no assist  Memory Memory assist level: Recognizes or recalls 90% of the time/requires cueing < 10% of the time    Medical Problem List and Plan: 1. Gait ataxia/dysmetria secondary to right cerebellar infarct on 07/15/2015.    Follow-up cranial CT scan 07/18/2015, reviewed, no significant changes  Continue CIR 2. DVT Prophylaxis/Anticoagulation: SCDs. Monitor for any signs of DVT 3. Pain Management: Tylenol as needed 4. Mood: Xanax 0.5 mg bedtime as needed 5. Neuropsych: This patient is capable of making decisions on his own behalf. 6. Skin/Wound Care: Routine skin checks 7. Fluids/Electrolytes/Nutrition: Routine I&O's 8. Hypertension.   Cont Toprol 25 mg daily.   Blood pressure improving  Will consider initiating medications if necessary    9. CAD with remote MI. No chest pain or shortness of breath. Continue aspirin and PlavixMonitor with increased mobility 10. COPD with remote tobacco abuse. Continue Dulera twice daily 11. Hyperlipidemia. Lipitor 12. CKD:  Creatinine 1.31 on 5/10  Will continue to monitor 12.  Constipation  Improving  Increased bowel regimen on 5/11 13. Nausea:   Encouraged patient to ask for when necessary antiemetic when necessary  Improving overall  LOS (Days) 4 A FACE TO FACE EVALUATION WAS PERFORMED  SWARTZ,ZACHARY T 07/21/2015 8:44 AM

## 2015-07-21 NOTE — Progress Notes (Signed)
Physical Therapy Session Note  Patient Details  Name: Martin [name] MRN: [id number] Date of Birth: June 03, 1930  Today's Date: 07/21/2015 PT Individual Time: 1300-1400 PT Individual Time Calculation (min): 60 min   Short Term Goals: Week 1:  PT Short Term Goal 1 (Week 1): =LTGs due to ELOS  Skilled Therapeutic Interventions/Progress Updates:    Patient received supine, asleep in bed. He was aroused easily and agreeable to PT. Patient performe supine>sit without cues from PT, but noted to require increased time and effort.  Gait training for 184f, 634fx 2,and 20060fith RW and min A progressing to supervision A provided by PT. Patient noted to veer to R and have mild antalgic gait pattern to increased with increased distance.  Dynamic gait training on obstacle course including weave through 4 cones and stepping over 4 bar weights. x 4 PT provided min-Supervision A as well as cues for improved step length on the R with turns to L to prevent Lateral LOB as well as heavy cues for AD management while stepping over obstacle. Patient responded very well to instruction and was able to cary over cues from PT 75% of the time.    Balance/coordination training Forward and lateral stepping to on of 4 targets x 20 BLE, no UE support; PT provided min support as well as min-mod cues for decreased step length to the R and improved weight shift to increase stability with stepping.  Standing on airex , R and L lateral reach to stack 10 cups completed with BUE. Min A from PT as min-mod tactile cues for improved weight shift R>L and increase trunk rotation to allow increaesd reach with BUE. .  Theotis [place]ard: A/P 2x1 minute; BUE support x1 and 1 UE support x 1. Static standing with 2 UE support for 1 minute, Static standing with 1 UE support for 1 minute. Attempted static standing without UE support, unable to make corrections through ankle strategy, and overactive use of hip strategy, resulting in significant  instability Kinetron weight shift L and R x 2 minutes on 10cm/sec; min A from PT for improved erect posture and cues for improved weight bearing through RLE.   Patient left supine in bed with call bell within reach.       Therapy Documentation Precautions:  Precautions Precautions: Fall Precaution Comments: ataxia, R lean Restrictions Weight Bearing Restrictions: No   See Function Navigator for Current Functional Status.   Therapy/Group: Individual Therapy  AusLorie Phenix13/2017, 5:19 PM

## 2015-07-21 NOTE — Progress Notes (Signed)
Occupational Therapy Session Note  Patient Details  Name: Martin [name] MRN: [id number] Date of Birth: 1930/06/20  Today's Date: 07/21/2015 OT Individual Time: 1000-1100 OT Individual Time Calculation (min): 60 min    Short Term Goals: Week 1:  OT Short Term Goal 1 (Week 1): STGs=LTGs secondary to short estimated LOS  Skilled Therapeutic Interventions/Progress Updates:    Upon entering the room, pt supine in bed with RN in room to give medications. Pt with no c/o pain this session and agreeable to OT intervention. Pt declined shower but requested to change clothing this session. Pt ambulated in room with RW with close supervision to dresser to obtain clothing items by bending down and reaching low into drawer with steady assist. Pt then ambulating to hanging closet and reached upwards out of base of support to obtain hanging shirt. Pt returned to sit on EOB to don clothing items. Pt able to button shirt with increased time secondary to decreased coordination. Pt required steady assist for balance with LB clothing management as B UE's not supported during this task. Pt ambulated 12' to ortho gym with RW and supervision. Pt performed controlled sit <>stand with B LEs only for task x 2 sets of 10 with minimal cues for technique. Pt required 4 rest breaks throughout this session. Pt returned to room at end of session and seated on EOB with call bell and all needed items within reach upon exiting the room.  Therapy Documentation Precautions:  Precautions Precautions: Fall Precaution Comments: ataxia, R lean Restrictions Weight Bearing Restrictions: No  See Function Navigator for Current Functional Status.   Therapy/Group: Individual Therapy  Phineas Semen 07/21/2015, 11:01 AM

## 2015-07-21 NOTE — Progress Notes (Signed)
Physical Therapy Session Note  Patient Details  Name: BERKELEY VANAKEN MRN: [id number] Date of Birth: October 31, 1930  Today's Date: 07/21/2015 PT Individual Time: 0800-0900 PT Individual Time Calculation (min): 60 min   Short Term Goals: Week 1:  PT Short Term Goal 1 (Week 1): =LTGs due to ELOS  Skilled Therapeutic Interventions/Progress Updates:  Pt was seen bedside in the am. Pt transferred supine to edge of bed with no assist. Pt performed all sit to stand and stand pivot transfers with rolling walker and S with verbal cues. Pt ambulated 150 feet x 2 with rolling walker and S, no LOB. Pt ambulated with st cane about 158 feet with min A and verbal cues, increased lateral to R especially with fatigue. Treatment in gym focused on NMR utilizing cone taps, criss cross cone taps and alternating cone taps, 3 sets x 10 reps each. Pt ascended/descended 12 stairs with 1 to 2 rails and c/s to min guard with verbal cues. Pt returned to room and after treatment and left sitting on edge of bed with call bell within reach.   Therapy Documentation Precautions:  Precautions Precautions: Fall Precaution Comments: ataxia, R lean Restrictions Weight Bearing Restrictions: No General:   Vital Signs:  Pain: No c/o pain at rest, c/o mod pain R ankle with weight bearing activities.   See Function Navigator for Current Functional Status.   Therapy/Group: Individual Therapy  Dub Amis 07/21/2015, 12:34 PM

## 2015-07-22 ENCOUNTER — Inpatient Hospital Stay (HOSPITAL_COMMUNITY): Payer: PRIVATE HEALTH INSURANCE | Admitting: Occupational Therapy

## 2015-07-22 ENCOUNTER — Inpatient Hospital Stay (HOSPITAL_COMMUNITY): Payer: Medicare Other | Admitting: Physical Therapy

## 2015-07-22 NOTE — Progress Notes (Signed)
[place] PHYSICAL MEDICINE & REHABILITATION     PROGRESS NOTE  Subjective/Complaints:  Had a pretty good night. No complaints. Moved bowels yesterday  ROS: . Denies CP, SOB, vomiting, diarrhea.  Objective: Vital Signs: Blood pressure 139/52, pulse 67, temperature 98.2 F (36.8 C), temperature source Oral, resp. rate 18, weight 65.1 kg (143 lb 8.3 oz), SpO2 93 %. No results found. No results for input(s): WBC, HGB, HCT, PLT in the last 72 hours. No results for input(s): NA, K, CL, GLUCOSE, BUN, CREATININE, CALCIUM in the last 72 hours.  Invalid input(s): CO CBG (last 3)  No results for input(s): GLUCAP in the last 72 hours.  Wt Readings from Last 3 Encounters:  07/18/15 65.1 kg (143 lb 8.3 oz)  07/15/15 66.225 kg (146 lb)  01/04/15 64.864 kg (143 lb)    Physical Exam:  BP 139/52 mmHg  Pulse 67  Temp(Src) 98.2 F (36.8 C) (Oral)  Resp 18  Wt 65.1 kg (143 lb 8.3 oz)  SpO2 93% Constitutional:  He appears well-developed and well-nourished. NAD. Vital signs reviewed. HENT: Normocephalic and atraumatic.  Eyes: Conjunctivae and EOM are normal.  Cardiovascular: Normal rate and regular rhythm.  Respiratory: Effort normal. No respiratory distress.  GI: Soft. Bowel sounds are normal. He exhibits no distension.  Musculoskeletal: He exhibits no edema or tenderness.  Neurological: He is alert and oriented.  Speech is clear.  Motor:  Right upper extremity: 4+/5 proximal to distal Left upper extremity: 5/5 proximal to distal Right lower extremity: 4/5 proximal to distal Left lower extremity: 5/5 proximal to distal  Right sided: No significant ataxia or dysmetria, but with slow and deliberate movements.  Skin: Skin is warm and dry.  Psychiatric: He has a normal mood and affect. His behavior is normal. Thought content normal  Assessment/Plan: 1. Functional deficits secondary to right cerebellar infarct which require 3+ hours per day of interdisciplinary therapy in a  comprehensive inpatient rehab setting. Physiatrist is providing close team supervision and 24 hour management of active medical problems listed below. Physiatrist and rehab team continue to assess barriers to discharge/monitor patient progress toward functional and medical goals.  Function:  Bathing Bathing position Bathing activity did not occur: Refused Position: Production manager parts bathed by patient: Right arm, Left arm, Chest, Abdomen, Front perineal area, Buttocks, Right upper leg, Left upper leg, Right lower leg, Left lower leg Body parts bathed by helper: Back  Bathing assist Assist Level: Touching or steadying assistance(Pt > 75%)      Upper Body Dressing/Undressing Upper body dressing Upper body dressing/undressing activity did not occur: Refused What is the patient wearing?: Pull over shirt/dress, Button up shirt     Pull over shirt/dress - Perfomed by patient: Thread/unthread right sleeve, Thread/unthread left sleeve, Put head through opening, Pull shirt over trunk   Button up shirt - Perfomed by patient: Thread/unthread right sleeve, Thread/unthread left sleeve, Button/unbutton shirt Button up shirt - Perfomed by helper: Pull shirt around back    Upper body assist Assist Level: Touching or steadying assistance(Pt > 75%)   Set up : To obtain clothing/put away  Lower Body Dressing/Undressing Lower body dressing Lower body dressing/undressing activity did not occur: Refused What is the patient wearing?: Non-skid slipper socks, Underwear, Pants Underwear - Performed by patient: Thread/unthread right underwear leg, Thread/unthread left underwear leg, Pull underwear up/down   Pants- Performed by patient: Thread/unthread right pants leg, Thread/unthread left pants leg, Pull pants up/down, Fasten/unfasten pants   Non-skid slipper socks- Performed by  patient: Don/doff right sock, Don/doff left sock                    Lower body assist Assist for lower body  dressing: Touching or steadying assistance (Pt > 75%)      Toileting Toileting   Toileting steps completed by patient: Adjust clothing after toileting, Performs perineal hygiene, Adjust clothing prior to toileting   Toileting Assistive Devices: Grab bar or rail  Toileting assist Assist level: Supervision or verbal cues   Transfers Chair/bed transfer   Chair/bed transfer method: Ambulatory Chair/bed transfer assist level: Supervision or verbal cues Chair/bed transfer assistive device: Medical sales representative     Max distance: 237f Assist level: Touching or steadying assistance (Pt > 75%)   Wheelchair          Cognition Comprehension Comprehension assist level: Follows basic conversation/direction with no assist  Expression Expression assist level: Expresses basic needs/ideas: With no assist  Social Interaction Social Interaction assist level: Interacts appropriately with others with medication or extra time (anti-anxiety, antidepressant).  Problem Solving Problem solving assist level: Solves basic problems with no assist  Memory Memory assist level: Recognizes or recalls 90% of the time/requires cueing < 10% of the time    Medical Problem List and Plan: 1. Gait ataxia/dysmetria secondary to right cerebellar infarct on 07/15/2015.    Follow-up cranial CT scan 07/18/2015, reviewed, no significant changes  Continue CIR 2. DVT Prophylaxis/Anticoagulation: SCDs. Monitor for any signs of DVT 3. Pain Management: Tylenol as needed 4. Mood: Xanax 0.5 mg bedtime as needed 5. Neuropsych: This patient is capable of making decisions on his own behalf. 6. Skin/Wound Care: Routine skin checks 7. Fluids/Electrolytes/Nutrition: Routine I&O's 8. Hypertension.   Cont Toprol 25 mg daily.   Blood pressure improved  Will consider initiating medications if necessary    9. CAD with remote MI. No chest pain or shortness of breath. Continue aspirin and PlavixMonitor with increased  mobility 10. COPD with remote tobacco abuse. Continue Dulera twice daily 11. Hyperlipidemia. Lipitor 12. CKD:  Creatinine 1.31 on 5/10  Will continue to monitor 12. Constipation  Had bm yesterday  Increased bowel regimen on 5/11 13. Nausea:   Encouraged patient to ask for when necessary antiemetic when necessary  Improving overall  LOS (Days) 5 A FACE TO FACE EVALUATION WAS PERFORMED  Sheniqua Carolan T 07/22/2015 8:19 AM

## 2015-07-22 NOTE — Progress Notes (Signed)
Physical Therapy Session Note  Patient Details  Name: Martin [name] MRN: [id number] Date of Birth: 10-25-1930  Today's Date: 07/22/2015 PT Individual Time: 0930-1030 PT Individual Time Calculation (min): 60 min   Short Term Goals: Week 1:  PT Short Term Goal 1 (Week 1): =LTGs due to ELOS  Skilled Therapeutic Interventions/Progress Updates:  Pt was seen bedside in the am. Pt performed all sit to stand transfers with rolling walker and S. Pt ambulated 150 feet x 2 with rolling walker and S with verbal cues. In gym treatment focused on NMR utilizing cone taps, criss cross cone taps and alternating cone taps 3 sets x 10 reps each. Pt rode Nu-step, 5 minutes x 2 at level 2 for NMR and LE strengthening. Pt returned to room following treatment and left sitting on edge of bed with call bell within reach.   Therapy Documentation Precautions:  Precautions Precautions: Fall Precaution Comments: ataxia, R lean Restrictions Weight Bearing Restrictions: No General:   Pain: Pt c/o pain R ankle with activity.  See Function Navigator for Current Functional Status.   Therapy/Group: Individual Therapy  Dub Amis 07/22/2015, 12:01 PM

## 2015-07-23 ENCOUNTER — Inpatient Hospital Stay (HOSPITAL_COMMUNITY): Payer: Medicare Other | Admitting: Occupational Therapy

## 2015-07-23 ENCOUNTER — Encounter (HOSPITAL_COMMUNITY): Payer: Self-pay | Admitting: *Deleted

## 2015-07-23 ENCOUNTER — Inpatient Hospital Stay (HOSPITAL_COMMUNITY): Payer: Medicare Other | Admitting: Physical Therapy

## 2015-07-23 LAB — CBC WITH DIFFERENTIAL/PLATELET
BASOS ABS: 0 10*3/uL (ref 0.0–0.1)
Basophils Relative: 0 %
EOS PCT: 4 %
Eosinophils Absolute: 0.3 10*3/uL (ref 0.0–0.7)
HEMATOCRIT: 41.9 % (ref 39.0–52.0)
Hemoglobin: 14.2 g/dL (ref 13.0–17.0)
LYMPHS PCT: 12 %
Lymphs Abs: 1 10*3/uL (ref 0.7–4.0)
MCH: 32.9 pg (ref 26.0–34.0)
MCHC: 33.9 g/dL (ref 30.0–36.0)
MCV: 97 fL (ref 78.0–100.0)
MONO ABS: 0.7 10*3/uL (ref 0.1–1.0)
MONOS PCT: 9 %
NEUTROS ABS: 6.2 10*3/uL (ref 1.7–7.7)
Neutrophils Relative %: 75 %
PLATELETS: 143 10*3/uL — AB (ref 150–400)
RBC: 4.32 MIL/uL (ref 4.22–5.81)
RDW: 12.7 % (ref 11.5–15.5)
WBC: 8.2 10*3/uL (ref 4.0–10.5)

## 2015-07-23 LAB — BASIC METABOLIC PANEL
ANION GAP: 11 (ref 5–15)
BUN: 35 mg/dL — AB (ref 6–20)
CALCIUM: 9.1 mg/dL (ref 8.9–10.3)
CO2: 23 mmol/L (ref 22–32)
CREATININE: 1.61 mg/dL — AB (ref 0.61–1.24)
Chloride: 99 mmol/L — ABNORMAL LOW (ref 101–111)
GFR calc Af Amer: 44 mL/min — ABNORMAL LOW (ref >60)
GFR, EST NON AFRICAN AMERICAN: 38 mL/min — AB (ref >60)
GLUCOSE: 313 mg/dL — AB (ref 65–99)
Potassium: 3.6 mmol/L (ref 3.5–5.1)
Sodium: 133 mmol/L — ABNORMAL LOW (ref 135–145)

## 2015-07-23 NOTE — Progress Notes (Signed)
[place] PHYSICAL MEDICINE & REHABILITATION     PROGRESS NOTE  Subjective/Complaints:  Patient seen emanating from restroom to his bed. He states he had a good weekend and is feeling well. He notes significant improvement in nausea and resolution of constipation.  ROS:  Denies CP, SOB, vomiting, diarrhea.  Objective: Vital Signs: Blood pressure 130/64, pulse 71, temperature 98.6 F (37 C), temperature source Oral, resp. rate 18, weight 65.1 kg (143 lb 8.3 oz), SpO2 96 %. No results found. No results for input(s): WBC, HGB, HCT, PLT in the last 72 hours. No results for input(s): NA, K, CL, GLUCOSE, BUN, CREATININE, CALCIUM in the last 72 hours.  Invalid input(s): CO CBG (last 3)  No results for input(s): GLUCAP in the last 72 hours.  Wt Readings from Last 3 Encounters:  07/18/15 65.1 kg (143 lb 8.3 oz)  07/15/15 66.225 kg (146 lb)  01/04/15 64.864 kg (143 lb)    Physical Exam:  BP 130/64 mmHg  Pulse 71  Temp(Src) 98.6 F (37 C) (Oral)  Resp 18  Wt 65.1 kg (143 lb 8.3 oz)  SpO2 96% Constitutional:  He appears well-developed and well-nourished. NAD. Vital signs reviewed. HENT: Normocephalic and atraumatic.  Eyes: Conjunctivae and EOM are normal.  Cardiovascular: Normal rate and regular rhythm.  Respiratory: Effort normal. No respiratory distress.  GI: Soft. Bowel sounds are normal. He exhibits no distension.  Musculoskeletal: He exhibits no edema or tenderness.  Neurological: He is alert and oriented.  Speech is clear.  Motor:  Right upper extremity: 4+/5 proximal to distal Left upper extremity: 5/5 proximal to distal Right lower extremity: 4+/5 proximal to distal Left lower extremity: 5/5 proximal to distal  Right sided: No significant ataxia or dysmetria, but with slow and deliberate movements.  Skin: Skin is warm and dry.  Psychiatric: He has a normal mood and affect. His behavior is normal. Thought content normal  Assessment/Plan: 1. Functional deficits  secondary to right cerebellar infarct which require 3+ hours per day of interdisciplinary therapy in a comprehensive inpatient rehab setting. Physiatrist is providing close team supervision and 24 hour management of active medical problems listed below. Physiatrist and rehab team continue to assess barriers to discharge/monitor patient progress toward functional and medical goals.  Function:  Bathing Bathing position Bathing activity did not occur: Refused Position: Production manager parts bathed by patient: Right arm, Left arm, Chest, Abdomen, Front perineal area, Buttocks, Right upper leg, Left upper leg, Right lower leg, Left lower leg Body parts bathed by helper: Back  Bathing assist Assist Level: Supervision or verbal cues      Upper Body Dressing/Undressing Upper body dressing Upper body dressing/undressing activity did not occur: Refused What is the patient wearing?: Pull over shirt/dress, Button up shirt     Pull over shirt/dress - Perfomed by patient: Thread/unthread right sleeve, Thread/unthread left sleeve, Put head through opening, Pull shirt over trunk   Button up shirt - Perfomed by patient: Thread/unthread right sleeve, Thread/unthread left sleeve, Button/unbutton shirt Button up shirt - Perfomed by helper: Pull shirt around back    Upper body assist Assist Level: Supervision or verbal cues   Set up : To obtain clothing/put away  Lower Body Dressing/Undressing Lower body dressing Lower body dressing/undressing activity did not occur: Refused What is the patient wearing?: Non-skid slipper socks, Underwear, Pants Underwear - Performed by patient: Thread/unthread right underwear leg, Thread/unthread left underwear leg, Pull underwear up/down   Pants- Performed by patient: Thread/unthread right pants leg, Thread/unthread  left pants leg, Pull pants up/down, Fasten/unfasten pants   Non-skid slipper socks- Performed by patient: Don/doff right sock, Don/doff left  sock                    Lower body assist Assist for lower body dressing: Supervision or verbal cues      Toileting Toileting   Toileting steps completed by patient: Adjust clothing after toileting, Performs perineal hygiene, Adjust clothing prior to toileting   Toileting Assistive Devices: Grab bar or rail  Toileting assist Assist level: Supervision or verbal cues   Transfers Chair/bed transfer   Chair/bed transfer method: Ambulatory Chair/bed transfer assist level: Supervision or verbal cues Chair/bed transfer assistive device: Medical sales representative     Max distance: 150 Assist level: Supervision or verbal cues   Wheelchair          Cognition Comprehension Comprehension assist level: Follows complex conversation/direction with no assist  Expression Expression assist level: Expresses basic needs/ideas: With no assist  Social Interaction Social Interaction assist level: Interacts appropriately with others with medication or extra time (anti-anxiety, antidepressant).  Problem Solving Problem solving assist level: Solves basic problems with no assist  Memory Memory assist level: Recognizes or recalls 90% of the time/requires cueing < 10% of the time    Medical Problem List and Plan: 1. Gait ataxia/dysmetria secondary to right cerebellar infarct on 07/15/2015.    Follow-up cranial CT scan 07/18/2015, reviewed, no significant changes  Continue CIR 2. DVT Prophylaxis/Anticoagulation: SCDs. Monitor for any signs of DVT 3. Pain Management: Tylenol as needed 4. Mood: Xanax 0.5 mg bedtime as needed 5. Neuropsych: This patient is capable of making decisions on his own behalf. 6. Skin/Wound Care: Routine skin checks 7. Fluids/Electrolytes/Nutrition: Routine I&O's 8. Hypertension.   Cont Toprol 25 mg daily.   Blood pressure improved  Will consider Increasing medications if necessary 9. CAD with remote MI. No chest pain or shortness of breath. Continue  aspirin and PlavixMonitor with increased mobility 10. COPD with remote tobacco abuse. Continue Dulera twice daily 11. Hyperlipidemia. Lipitor 12. CKD:  Creatinine 1.31 on 5/10  Labs pending for today 12. Constipation: Resolved  Increased bowel regimen on 5/11 13. Nausea:   Encouraged patient to ask for when necessary antiemetic when necessary  Negative family improved  LOS (Days) 6 A FACE TO FACE EVALUATION WAS PERFORMED  Kallie Depolo Lorie Phenix 07/23/2015 9:26 AM

## 2015-07-23 NOTE — Progress Notes (Signed)
Physical Therapy Session Note  Patient Details  Name: Martin [name] MRN: [id number] Date of Birth: 10-21-1930  Today's Date: 07/23/2015 PT Individual Time: 1012-1105 PT Individual Time Calculation (min): 53 min   Short Term Goals: Week 1:  PT Short Term Goal 1 (Week 1): =LTGs due to ELOS  Skilled Therapeutic Interventions/Progress Updates:    Pt received resting in bed, no c/o pain and agreeable to therapy session.    Pt performs bed mobility mod I without bed functions, and transfers with RW mod I.  Introduced [facility] to pt and pt requires supervision to transfer with cane.  Pt requesting to toilet prior to heading to therapy gym and performed transfers and toileting tasks with set up assist only.    Gait training x150' +90' with RW and distant supervision.  Pt demos improved midline orientation and R=L step length with no LOB noted during turns or otherwise.  PT introduced [facility] to pt and pt amb 64' with steady assist>close supervision, 131' +135' with close supervision.  When amb with SBQC pt demonstrates weaving with fatigue and decreased coordination with some decreased BOS and scissoring that improved with practice.  Will continue to train with [facility] during therapy to maximize independence at time of d/c.    NMR for step length and alternating weight shifts with RLE and LLE toe taps to targets in semi-circle 2x8 on each LE.  Pt requiring light steady assist for toe taps with RLE and supervision for toe taps with LLE.    Pt returned to room at end of session and seated EOB with call bell in reach and needs met.   Therapy Documentation Precautions:  Precautions Precautions: Fall Precaution Comments: ataxia, R lean Restrictions Weight Bearing Restrictions: No   See Function Navigator for Current Functional Status.   Therapy/Group: Individual Therapy  Earnest Conroy Penven-Crew 07/23/2015, 12:22 PM

## 2015-07-23 NOTE — Progress Notes (Signed)
Occupational Therapy Session Note  Patient Details  Name: Martin [name] MRN: [id number] Date of Birth: [birth year]  Today's Date: 07/23/2015 OT Individual Time: 0705-0800 and 1300-1427 OT Individual Time Calculation (min): 55 min and 87 min    Short Term Goals: Week 1:  OT Short Term Goal 1 (Week 1): STGs=LTGs secondary to short estimated LOS  Skilled Therapeutic Interventions/Progress Updates:  Session 1: Upon entering the room, pt supine in bed with no c/o pain this session. Pt asking therapist to gather all items for him to save time. OT educated pt on purpose of him performing all tasks secondary to goal level and pt returning home alone. Pt verbalized understanding. Pt ambulated in room with RW and supervision to obtain clothing items for bathing and dressing session. Pt performed stand pivot transfer with supervision while seated on TTB in walk in shower with use of grab bar. Pt engaged in sit <>stand with use of grab bar to wash buttocks with supervision. Pt returned to sit on EOB for dressing tasks with supervision as well and increased time. Pt standing at sink with RW for grooming tasks without difficulty . Pt returned to bed at end of session with call bell and all needed items within reach upon exiting the room.   Session 2: Upon entering the room, pt supine in bed with no c/o pain this session. Pt ambulating in room with RW with supervision to obtain dirty clothing items as well as hang clothing up in dresser. Pt switched to ambulation with quad cane with min verbal cues for proper technique and to decrease speed of gait. Pt ambulating with steady assistance with quad cane in one hand and carrying laundry bag in the other. Pt needing to take one seated rest break secondary to fatigue. Once at washing machine, pt placed items into machine with close supervision and increased time to turn on unfamiliar washing machine. Pt returned to room in same manner as above. Pt seated in wheelchair and  propelled downstairs to main entrance for energy conservation. Pt ambulating with RW on variety of uneven surfaces with supervision. Pt also walking up and down 4 steps while holding onto 1 rail on right with supervision. Pt returned to wheelchair and propelled wheelchair to room with B UE's for strength and endurance. Pt transferred with supervision and use of RW into recliner chair with call bell and all needed items within reach upon exiting the room.   Therapy Documentation Precautions:  Precautions Precautions: Fall Precaution Comments: ataxia, R lean Restrictions Weight Bearing Restrictions: No Vital Signs: Therapy Vitals Temp: 98.6 F (37 C) Temp Source: Oral Pulse Rate: 71 Resp: 18 BP: 130/64 mmHg Patient Position (if appropriate): Lying Oxygen Therapy SpO2: 96 % O2 Device: Not Delivered  See Function Navigator for Current Functional Status.   Therapy/Group: Individual Therapy  Phineas Semen 07/23/2015, 8:01 AM

## 2015-07-24 ENCOUNTER — Inpatient Hospital Stay (HOSPITAL_COMMUNITY): Payer: Medicare Other | Admitting: Occupational Therapy

## 2015-07-24 ENCOUNTER — Inpatient Hospital Stay (HOSPITAL_COMMUNITY): Payer: PRIVATE HEALTH INSURANCE | Admitting: Physical Therapy

## 2015-07-24 NOTE — Progress Notes (Signed)
[place] PHYSICAL MEDICINE & REHABILITATION     PROGRESS NOTE  Subjective/Complaints:  Patient lying in bed this morning. He states he could be better, when probed, he states he just could be better if he was at home, but is doing well for the most part.  ROS:  Denies CP, SOB, vomiting, diarrhea.  Objective: Vital Signs: Blood pressure 129/49, pulse 65, temperature 98.4 F (36.9 C), temperature source Oral, resp. rate 19, weight 65.1 kg (143 lb 8.3 oz), SpO2 95 %. No results found.  Recent Labs  07/23/15 0858  WBC 8.2  HGB 14.2  HCT 41.9  PLT 143*    Recent Labs  07/23/15 0858  NA 133*  K 3.6  CL 99*  GLUCOSE 313*  BUN 35*  CREATININE 1.61*  CALCIUM 9.1   CBG (last 3)  No results for input(s): GLUCAP in the last 72 hours.  Wt Readings from Last 3 Encounters:  07/18/15 65.1 kg (143 lb 8.3 oz)  07/15/15 66.225 kg (146 lb)  01/04/15 64.864 kg (143 lb)    Physical Exam:  BP 129/49 mmHg  Pulse 65  Temp(Src) 98.4 F (36.9 C) (Oral)  Resp 19  Wt 65.1 kg (143 lb 8.3 oz)  SpO2 95% Constitutional:  He appears well-developed and well-nourished. NAD. Vital signs reviewed. HENT: Normocephalic and atraumatic.  Eyes: Conjunctivae and EOM are normal.  Cardiovascular: Normal rate and regular rhythm.  Respiratory: Effort normal. No respiratory distress.  GI: Soft. Bowel sounds are normal. He exhibits no distension.  Musculoskeletal: He exhibits no edema or tenderness.  Neurological: He is alert and oriented.  Speech is clear.  Motor:  Right upper extremity: 4+-5/5 proximal to distal Left upper extremity: 5/5 proximal to distal Right lower extremity: 4+/5 proximal to distal Left lower extremity: 5/5 proximal to distal  Right sided: No significant ataxia or dysmetria, but with slow and deliberate movements.  Skin: Skin is warm and dry.  Psychiatric: He has a normal mood and affect. His behavior is normal. Thought content normal  Assessment/Plan: 1.  Functional deficits secondary to right cerebellar infarct which require 3+ hours per day of interdisciplinary therapy in a comprehensive inpatient rehab setting. Physiatrist is providing close team supervision and 24 hour management of active medical problems listed below. Physiatrist and rehab team continue to assess barriers to discharge/monitor patient progress toward functional and medical goals.  Function:  Bathing Bathing position Bathing activity did not occur: Refused Position: Production manager parts bathed by patient: Right arm, Left arm, Chest, Abdomen, Front perineal area, Buttocks, Right upper leg, Left upper leg, Right lower leg, Left lower leg Body parts bathed by helper: Back  Bathing assist Assist Level: Supervision or verbal cues      Upper Body Dressing/Undressing Upper body dressing Upper body dressing/undressing activity did not occur: Refused What is the patient wearing?: Pull over shirt/dress, Button up shirt     Pull over shirt/dress - Perfomed by patient: Thread/unthread right sleeve, Thread/unthread left sleeve, Put head through opening, Pull shirt over trunk   Button up shirt - Perfomed by patient: Thread/unthread right sleeve, Thread/unthread left sleeve, Button/unbutton shirt Button up shirt - Perfomed by helper: Pull shirt around back    Upper body assist Assist Level: Supervision or verbal cues   Set up : To obtain clothing/put away  Lower Body Dressing/Undressing Lower body dressing Lower body dressing/undressing activity did not occur: Refused What is the patient wearing?: Non-skid slipper socks, Underwear, Pants Underwear - Performed by patient: Thread/unthread  right underwear leg, Thread/unthread left underwear leg, Pull underwear up/down   Pants- Performed by patient: Thread/unthread right pants leg, Thread/unthread left pants leg, Pull pants up/down, Fasten/unfasten pants   Non-skid slipper socks- Performed by patient: Don/doff right  sock, Don/doff left sock                    Lower body assist Assist for lower body dressing: Supervision or verbal cues      Toileting Toileting   Toileting steps completed by patient: Adjust clothing prior to toileting, Performs perineal hygiene, Adjust clothing after toileting   Toileting Assistive Devices: Grab bar or rail  Toileting assist Assist level: Set up/obtain supplies   Transfers Chair/bed transfer   Chair/bed transfer method: Ambulatory Chair/bed transfer assist level: No Help, no cues, assistive device, takes more than a reasonable amount of time Chair/bed transfer assistive device: Medical sales representative     Max distance: 150 Assist level: Supervision or verbal cues   Wheelchair          Cognition Comprehension Comprehension assist level: Follows complex conversation/direction with no assist  Expression Expression assist level: Expresses complex ideas: With no assist  Social Interaction Social Interaction assist level: Interacts appropriately with others with medication or extra time (anti-anxiety, antidepressant).  Problem Solving Problem solving assist level: Solves basic problems with no assist  Memory Memory assist level: Recognizes or recalls 90% of the time/requires cueing < 10% of the time    Medical Problem List and Plan: 1. Gait ataxia/dysmetria secondary to right cerebellar infarct on 07/15/2015.    Follow-up cranial CT scan 07/18/2015, reviewed, no significant changes  Continue CIR 2. DVT Prophylaxis/Anticoagulation: SCDs. Monitor for any signs of DVT 3. Pain Management: Tylenol as needed 4. Mood: Xanax 0.5 mg bedtime as needed 5. Neuropsych: This patient is capable of making decisions on his own behalf. 6. Skin/Wound Care: Routine skin checks 7. Fluids/Electrolytes/Nutrition: Routine I&O's 8. Hypertension.   Cont Toprol 25 mg daily.   Blood pressure Continued to improve  Will consider Increasing medications if  necessary 9. CAD with remote MI. No chest pain or shortness of breath. Continue aspirin and PlavixMonitor with increased mobility 10. COPD with remote tobacco abuse. Continue Dulera twice daily 11. Hyperlipidemia. Lipitor 12. CKD:  Creatinine 1.61 on 5/15  Encourage fluid intake 12. Constipation: Resolved  Increased bowel regimen on 5/11 13. Nausea:   Encouraged patient to ask for when necessary antiemetic when necessary  Negative family improved  LOS (Days) 7 A FACE TO FACE EVALUATION WAS PERFORMED  Ankit Lorie Phenix 07/24/2015 8:44 AM

## 2015-07-24 NOTE — Progress Notes (Signed)
Occupational Therapy Session Note  Patient Details  Name: Martin [name] MRN: [id number] Date of Birth: [birth year]  Today's Date: 07/24/2015 OT Individual Time: 0703-0800 [id number] OT Individual Time Calculation (min): 57 min and 45 min    Short Term Goals: Week 1:  OT Short Term Goal 1 (Week 1): STGs=LTGs secondary to short estimated LOS  Skilled Therapeutic Interventions/Progress Updates:    Session 1: Upon entering the room, pt beginning to start with breakfast tray. Pt with no c/o pain this session. Pt standing from EOB to don and fasten pants with supervision. Pt ambulating 150' with RW and supervision. Once in day room pt is seated to finish breakfast tray while therapist provides education regarding discharge recommendations, safety, and focus for remaining sessions. Pt active participant in discussion and ambulates back to room in same manner as above at end of session. Pt returning to bed with call bell and all needed items within reach upon exiting the room.   Session 2: Upon entering the room, pt seated in chair awaiting therapist. Pt ambulates in hallway 150' with RW and distant supervision to day room. Pt engaged in utilizing coffee maker for kitchen task. Pt utilized RW for side steps in order to reach all needed items from counter secondary to tight/small space. OT placed walker back onto RW in order for pt to be able to carry items at home from one location to another. Pt is daily coffee drinker and OT suggested special cup pt could utilize in order to place in bag and keep from spilling. Pt verbalized understanding. Pt taking seated rest break secondary to fatigue. Pt returning to room and made mod I with RW in room only as he is able to ambulate short distance safely with mod I and use of RW. Pt and NT educated on this for safety. Call bell and all needed items within reach upon exiting the room.   Therapy Documentation Precautions:  Precautions Precautions: Fall Precaution  Comments: ataxia, R lean Restrictions Weight Bearing Restrictions: No Vital Signs: Therapy Vitals Temp: 98.4 F (36.9 C) Temp Source: Oral Pulse Rate: 65 Resp: 19 BP: (!) 129/49 mmHg Patient Position (if appropriate): Lying Oxygen Therapy SpO2: 95 % O2 Device: Not Delivered  See Function Navigator for Current Functional Status.   Therapy/Group: Individual Therapy  Phineas Semen 07/24/2015, 8:13 AM

## 2015-07-24 NOTE — Progress Notes (Signed)
Occupational Therapy Session Note  Patient Details  Name: Martin [name] MRN: [id number] Date of Birth: 08/03/1930  Today's Date: 07/24/2015 OT Individual Time: 1275-1700 OT Individual Time Calculation (min): 33 min    Skilled Therapeutic Interventions/Progress Updates:    Pt completed toilet transfer with use of the quad cane and initial min assist for balance when standing up from the bed.  He was able to stand and urinate with close supervision.  After washing his hands at the sink had him ambulate to the therapy gym with use of the RW, as he finds this more comfortable and safe to use.  Worked on standing balance using foam surface in the gym while completing ball toss.  Mod assist for balance initially, progressing to min assist level.  LOB noted with initial attempts to step up on the foam as well with the RUE first.  Returned to room at end of session with pt left up in bedside chair for next session in 15 mins.     Therapy Documentation Precautions:  Precautions Precautions: Fall Precaution Comments: ataxia, R lean Restrictions Weight Bearing Restrictions: No  Pain: Pain Assessment Pain Assessment: No/denies pain ADL: See Function Navigator for Current Functional Status.   Therapy/Group: Individual Therapy  Jolette Lana OTR/L 07/24/2015, 4:06 PM

## 2015-07-24 NOTE — Progress Notes (Signed)
Physical Therapy Session Note  Patient Details  Name: Martin [name] MRN: [id number] Date of Birth: [birth year]  Today's Date: 07/24/2015 PT Individual Time: 0900-1000 PT Individual Time Calculation (min): 60 min   Short Term Goals: Week 1:  PT Short Term Goal 1 (Week 1): =LTGs due to ELOS  Skilled Therapeutic Interventions/Progress Updates:    Pt received resting EOB with no c/o pain and agreeable to therapy session.  Requesting to use toilet prior to leaving room.    Pt amb to/from bathroom with [facility] with supervision and performed toileting tasks with set up assist.    Gait training to/from therapy gym with SCQB and distant supervision fade to close supervision with fatigue.  PT instructed pt in stair negotiation x12 steps with R rail (holding cane in L hand but not using for support on stairs) with supervision in order to increase pt independence and safe access to second floor of his home at d/c.  High level gait training through obstacle course with [facility] focus on stepping over and weaving around objects x4 passes through course with supervision and intermittent CGA for balance with turns in tight spaces.    NMR for balance and LE strength with side stepping 4x30' (rest break between trials 2 and 3), minisquats x20, and figure 8 walking x2 with SBQC.   Pt returned to room at end of session and positioned sitting EOB with call bell in reach and needs met.   Therapy Documentation Precautions:  Precautions Precautions: Fall Precaution Comments: ataxia, R lean Restrictions Weight Bearing Restrictions: No   See Function Navigator for Current Functional Status.   Therapy/Group: Individual Therapy  Martin [name] 07/24/2015, 11:23 AM

## 2015-07-24 NOTE — Progress Notes (Addendum)
Nutrition Follow-up  DOCUMENTATION CODES:   Not applicable  INTERVENTION:  -Ensure Enlive po BID, each supplement provides 350 kcal and 20 grams of protein  NUTRITION DIAGNOSIS:   Increased nutrient needs related to chronic illness (COPD) as evidenced by estimated needs.  ongoing  GOAL:   Patient will meet greater than or equal to 90% of their needs  meeting  MONITOR:   PO intake, Supplement acceptance, Labs, Weight trends, Skin, I & O's  REASON FOR ASSESSMENT:   Malnutrition Screening Tool    ASSESSMENT:   82 y.o. right handed male coronary artery disease with remote MI, hypertension, COPD with remote tobacco abuse, diabetes mellitus. Independent prior to admission living alone. 2 level home with bedroom upstairs. He has a brother who lives across the street but cannot provide assistance. Admitted 07/15/2015 with double vision and gait disturbance. MRI of the brain shows moderately large acute mid to inferior right cerebellar infarct. Remote right thalamic and left basal ganglia small infarcts. 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 Patient did not receive TPA. Carotid Dopplers with left 40-59% ICA stenosis. EchocardiogramWith ejection fraction of 33% grade 1 diastolic dysfunction . Neurology consulted presently maintained on aspirin and Plavix for CVA prophylaxis 3 months then Plavix alone. Repeat CT scan showed focal mass effect but no hydrocephalus. Plan a follow-up CT 07/18/2015. Tolerating a regular diet. PhysicalAnd occupational therapy evaluations completed 07/16/2015 with recommendations of physical medicine rehabilitation consult.Patient was admitted for a comprehensive rehabilitation program  Mr. Illes appetite is improving, Meal completion averaging 93% over last 8 meals. Tolerating ensure 100% Nausea has improved and constipation has resolved. Weight is down 1 kg over 3 days. He had a new CT scan 5/10 ->  clear, no changes   Labs and Medications reviewed: Na 133, CBGs 122-127, Cr 1.61, Bun 35; Miralax, Senokot  Diet Order:  Diet Heart Room service appropriate?: Yes; Fluid consistency:: Thin  Skin:  Reviewed, no issues  Last BM:  5/6  Height:   Ht Readings from Last 1 Encounters:  07/15/15 '5\' 6"'$  (1.676 m)    Weight:   Wt Readings from Last 1 Encounters:  07/18/15 143 lb 8.3 oz (65.1 kg)    Ideal Body Weight:  64.5 kg  BMI:  Body mass index is 23.18 kg/(m^2).  Estimated Nutritional Needs:   Kcal:  4356-8616  Protein:  80-95 grams  Fluid:  1.6-1.8L  EDUCATION NEEDS:   No education needs identified at this time  Satira Anis. Lambert Jeanty, MS, RD LDN Inpatient Clinical Dietitian Pager [phone]

## 2015-07-25 ENCOUNTER — Inpatient Hospital Stay (HOSPITAL_COMMUNITY): Payer: PRIVATE HEALTH INSURANCE | Admitting: Physical Therapy

## 2015-07-25 ENCOUNTER — Inpatient Hospital Stay (HOSPITAL_COMMUNITY): Payer: Medicare Other | Admitting: Occupational Therapy

## 2015-07-25 NOTE — Progress Notes (Addendum)
Physical Therapy Discharge Summary  Patient Details  Name: Martin [name] MRN: [id number] Date of Birth: Jul 17, 1930  Today's Date: 07/25/2015 PT Individual Time: 1000-1100 and 1505-1530 PT Individual Time Calculation (min): 60 min and 25 min     Patient has met 10 of 10 long term goals due to improved activity tolerance, improved balance, improved postural control, increased strength, ability to compensate for deficits, improved attention, improved awareness and improved coordination.  Patient to discharge at an ambulatory level Modified Independent.   Patient to d/c home mod I with family and friends available to check in and provide assist to patient as needed.   Recommendation:  Patient will benefit from ongoing skilled PT services in [place] if unable to arrange transportation to outpatient therapy to continue to advance safe functional mobility, address ongoing impairments in balance, awareness, and coordination, and minimize fall risk.  Equipment: No equipment provided, pt has RW at home.   Reasons for discharge: treatment goals met  Patient/family agrees with progress made and goals achieved: Yes   Skilled Therapeutic Intervention: Session 1: Pt received sleeping in bed, easily awakes to voice, no c/o pain, and agreeable to therapy session.   Pt transfers mod I throughout session including toilet transfers, furniture transfers, bed<>chair, and car transfers.   Gait training throughout unit with RW mod I including controlled surfaces, uneven surfaces, curb negotiation, and negotiation of 16 steps with 1 rail.   PT administered BERG Balance Scale. Patient demonstrates significant fall risk as noted by score of 40/56 on Berg Balance Scale. Discussed results with pt and recommended pacing and use of RW until further advised by a therapist.  PT administered TUG with pt completing in an average of 11.85 seconds across 3 trials with RW.    Pt returned to room at end of session and left  sitting EOB with call bell in reach and needs met.   Session 2:  Pt received resting EOB with no c/o pain and agreeable to therapy session.  Session focus on floor transfers and d/c planning.    Pt transfers and ambulates throughout session with RW mod I.  Pt performs floor transfers with mod I.  PT reviewed pt safety when returning home in regards to fall recovery, e.g. Check for bleeding/extreme pain/remembering falling, etc.    Pt returned to room at end of session and positioned at EOB with call bell in reach and needs met.    PT Discharge Precautions/Restrictions Precautions Precaution Comments: impulsive, but improving Restrictions Weight Bearing Restrictions: No Vision/Perception  Vision - Assessment Eye Alignment: Within Functional Limits Ocular Range of Motion: Within Functional Limits Alignment/Gaze Preference: Within Defined Limits Tracking/Visual Pursuits: Decreased smoothness of eye movement to RIGHT superior field Saccades: Within functional limits Convergence: Within functional limits  Cognition Overall Cognitive Status: Within Functional Limits for tasks assessed Arousal/Alertness: Awake/alert Orientation Level: Oriented X4 Memory: Appears intact Awareness: Appears intact Problem Solving: Appears intact Safety/Judgment: Appears intact Sensation Sensation Light Touch: Appears Intact Additional Comments: subjective tingling in R foot (premorbid) and fingers (pt reports from finger sticks for BGS draws) Coordination Gross Motor Movements are Fluid and Coordinated: Yes Fine Motor Movements are Fluid and Coordinated: Yes Motor  Motor Motor: Ataxia Motor - Discharge Observations: R lean greatly improved since evaluation, continues to demonstrate decreased coordination during gait when fatigued  Mobility Bed Mobility Bed Mobility: Supine to Sit;Sit to Supine Supine to Sit: 6: Modified independent (Device/Increase time) Sit to Supine: 6: Modified independent  (Device/Increase time) Transfers Transfers: Yes Sit  to Stand: 6: Modified independent (Device/Increase time) Stand to Sit: 6: Modified independent (Device/Increase time) Locomotion  Ambulation Ambulation: Yes Ambulation/Gait Assistance: 6: Modified independent (Device/Increase time) Ambulation Distance (Feet): 160 Feet Assistive device: Rolling walker Gait Gait: Yes Gait Pattern: Within Functional Limits Stairs / Additional Locomotion Stairs: Yes Stairs Assistance: 6: Modified independent (Device/Increase time) Stair Management Technique: One rail Right Number of Stairs: 16 Height of Stairs: 6 Ramp: 6: Modified independent (Device) Curb: 6: Modified independent (Device/increase time) Wheelchair Mobility Wheelchair Mobility: No  Trunk/Postural Assessment  Cervical Assessment Cervical Assessment: Within Functional Limits Thoracic Assessment Thoracic Assessment: Within Functional Limits Lumbar Assessment Lumbar Assessment: Within Functional Limits Postural Control Postural Control: Within Functional Limits  Balance Balance Balance Assessed: Yes Standardized Balance Assessment Standardized Balance Assessment: Berg Balance Test;Timed Up and Go Test Berg Balance Test Sit to Stand: Able to stand  independently using hands Standing Unsupported: Able to stand safely 2 minutes Sitting with Back Unsupported but Feet Supported on Floor or Stool: Able to sit safely and securely 2 minutes Stand to Sit: Controls descent by using hands Transfers: Able to transfer safely, definite need of hands Standing Unsupported with Eyes Closed: Able to stand 10 seconds with supervision (decreased ankle strategy, excessive hip strategy) Standing Ubsupported with Feet Together: Able to place feet together independently and stand for 1 minute with supervision From Standing, Reach Forward with Outstretched Arm: Can reach forward >12 cm safely (5") From Standing Position, Pick up Object from Floor: Able  to pick up shoe safely and easily From Standing Position, Turn to Look Behind Over each Shoulder: Turn sideways only but maintains balance Turn 360 Degrees: Able to turn 360 degrees safely one side only in 4 seconds or less Standing Unsupported, Alternately Place Feet on Step/Stool: Able to complete 4 steps without aid or supervision Standing Unsupported, One Foot in Front: Able to plae foot ahead of the other independently and hold 30 seconds Standing on One Leg: Unable to try or needs assist to prevent fall Total Score: 40 Timed Up and Go Test TUG: Normal TUG Normal TUG (seconds): 11.86 Dynamic Sitting Balance Dynamic Sitting - Balance Support: No upper extremity supported Dynamic Sitting - Level of Assistance: 6: Modified independent (Device/Increase time) Static Standing Balance Static Standing - Balance Support: No upper extremity supported Static Standing - Level of Assistance: 6: Modified independent (Device/Increase time) Dynamic Standing Balance Dynamic Standing - Balance Support: During functional activity;No upper extremity supported Dynamic Standing - Level of Assistance: 6: Modified independent (Device/Increase time) Dynamic Standing - Balance Activities: Forward lean/weight shifting;Reaching for objects Extremity Assessment  RLE Assessment RLE Assessment: Within Functional Limits RLE AROM (degrees) RLE Overall AROM Comments: WFL assessed in sitting RLE Strength Right Hip Flexion: 5/5 Right Knee Flexion: 5/5 Right Knee Extension: 5/5 Right Ankle Dorsiflexion: 5/5 Right Ankle Plantar Flexion: 5/5 LLE Assessment LLE Assessment: Within Functional Limits LLE AROM (degrees) LLE Overall AROM Comments: WFL assessed in sitting LLE Strength Left Hip Flexion: 5/5 Left Knee Flexion: 5/5 Left Knee Extension: 5/5 Left Ankle Dorsiflexion: 5/5 Left Ankle Plantar Flexion: 5/5   See Function Navigator for Current Functional Status.  Niko Jakel E Penven-Crew 07/25/2015, 11:28  AM

## 2015-07-25 NOTE — Progress Notes (Signed)
Occupational Therapy Discharge Summary and OT Intervention  Patient Details  Name: Martin [name] MRN: [id number] Date of Birth: 1931/02/21  Today's Date: 07/25/2015 OT Individual Time: 0703-0800 and 1300-1345 OT Individual Time Calculation (min): 57 min and 45 min    Patient has met 12 of 12 long term goals due to improved activity tolerance, improved balance, ability to compensate for deficits, improved awareness and improved coordination.  Patient to discharge at overall Modified Independent level.    Reasons goals not met: all goals met  Recommendation:  Patient will benefit from ongoing skilled OT services in home health setting to continue to advance functional skills in the area of iADL.  Equipment: No equipment provided  Reasons for discharge: treatment goals met  Patient/family agrees with progress made and goals achieved: Yes   OT Intervention: Session 1: Upon entering the room, pt seated on EOB finishing breakfast with no c/o pain this session. Pt ambulating in room with RW and mod I to obtain all clothing items. Pt sitting down without cues to safely remove LB clothing. Pt performed shower from TTB in walk in shower with mod I as well. Pt dressing from EOB with clothing previously gathered without assist from therapist. Pt performed grooming tasks while standing at sink with RW and mod I. Pt continues to be mod I in room and safely ambulating around room with RW.    Session 2: Upon entering the room, pt supine in bed sleeping but agreeable to OT intervention. Pt seated on EOB and donning B shoes while seated on EOB with mod I. Pt ambulating 150' towards day room with RW and mod I. Pt engaged in routine task of watering plants. Pt obtained water container to fill container while safely managing RW with mod I. Pt utilized counter in order to slide container from plant to plant as he checked them. Pt taking seated rest break secondary to fatigue and therapist continues to provide  safety and energy conservation education for home environment. Pt returning to room in same manner as above with call bell and all needed items within reach upon exiting the room.   OT Discharge Precautions/Restrictions  Precautions Precautions: Fall Precaution Comments: ataxia, R lean Restrictions Weight Bearing Restrictions: No Vital Signs Therapy Vitals Temp: 98.5 F (36.9 C) Temp Source: Oral Pulse Rate: 75 Resp: 17 BP: (!) 130/55 mmHg Patient Position (if appropriate): Lying Oxygen Therapy SpO2: 96 % O2 Device: Not Delivered Pain Pain Assessment Pain Assessment: No/denies pain Cognition Overall Cognitive Status: Within Functional Limits for tasks assessed Arousal/Alertness: Awake/alert Orientation Level: Oriented X4 Memory: Appears intact Safety/Judgment: Appears intact Sensation Sensation Light Touch: Appears Intact Stereognosis: Not tested Hot/Cold: Appears Intact Proprioception: Appears Intact Coordination Gross Motor Movements are Fluid and Coordinated: Yes Fine Motor Movements are Fluid and Coordinated: Yes Motor  Motor Motor: Ataxia Motor - Discharge Observations: ocassional R lean with fatigue, ataxia has improved since evaluation Mobility  Bed Mobility Bed Mobility: Supine to Sit;Sit to Supine Supine to Sit: 6: Modified independent (Device/Increase time) Transfers Transfers: Sit to Stand;Stand to Sit Sit to Stand: 6: Modified independent (Device/Increase time) Stand to Sit: 6: Modified independent (Device/Increase time)  Trunk/Postural Assessment  Cervical Assessment Cervical Assessment: Within Functional Limits Thoracic Assessment Thoracic Assessment: Within Functional Limits Lumbar Assessment Lumbar Assessment: Within Functional Limits  Balance Balance Balance Assessed: Yes Dynamic Sitting Balance Dynamic Sitting - Level of Assistance: 6: Modified independent (Device/Increase time) Static Standing Balance Static Standing - Level of  Assistance: 6: Modified independent (Device/Increase time)  Dynamic Standing Balance Dynamic Standing - Balance Support: During functional activity;Left upper extremity supported;Right upper extremity supported Dynamic Standing - Level of Assistance: 6: Modified independent (Device/Increase time) Dynamic Standing - Balance Activities: Forward lean/weight shifting;Reaching for objects Extremity/Trunk Assessment RUE Assessment RUE Assessment: Within Functional Limits LUE Assessment LUE Assessment: Within Functional Limits   See Function Navigator for Current Functional Status.  Phineas Semen 07/25/2015, 7:32 AM

## 2015-07-25 NOTE — Discharge Summary (Signed)
Discharge summary job [id number]

## 2015-07-25 NOTE — Progress Notes (Signed)
Social Work Patient ID: Martin [name], male   DOB: 1930-07-20, 82 y.o.   MRN: [id number] Spoke with pt's sister-Edna to inform pt is being discharged tomorrow, she would like him to get his friend-Larry to transport him home due to stairs he has to get in. She will talk with larry or pt will to work out transportation home. Have made referral to [facility] for follow up therapies at home. Pt pleased to be going home and sister will check on him there.

## 2015-07-25 NOTE — Discharge Summary (Signed)
NAME:  Martin [name], Martin [name] NO.:  [id number]  MEDICAL RECORD NO.:  [id number]  LOCATION:  4M07C                        FACILITY:  [facility]  PHYSICIAN:  Delice Lesch, MD        DATE OF BIRTH:  1930/10/09  DATE OF ADMISSION:  07/17/2015 DATE OF DISCHARGE:  07/26/2015                              DISCHARGE SUMMARY   DISCHARGE DIAGNOSES: 1. Right cerebellar infarction. 2. SCDs for deep vein thrombosis prophylaxis. 3. Anxiety. 4. Hypertension. 5. Coronary artery disease with remote myocardial infarction. 6. Chronic obstructive pulmonary disease. 7. Chronic renal insufficiency. 8. Constipation, resolved.  HISTORY OF PRESENT ILLNESS:  This is an 82 year old right-handed male with history of hypertension, COPD, remote tobacco abuse, coronary artery disease.  Independent prior to admission living alone.  He has a brother who lives across the street.  Admitted Jul 15, 2015, with double vision and gait disturbance.  MRI of the brain showed moderately large acute mid to inferior right cerebellar infarct.  Remote right thalamic and left basal ganglia small infarcts.  MRA of the head, right vertebral artery and right posterior-inferior cerebellar artery occluded.  The patient did not receive tPA.  Carotid Dopplers with left 40-59% ICA stenosis.  Echocardiogram with ejection fraction of 49% grade 1 diastolic dysfunction.  Neurology consulted, maintained on aspirin and Plavix therapy x3 months then Plavix alone.  Repeat CT scan showed focal mass effect but no hydrocephalus.  Tolerating a regular diet.  The patient was admitted for a comprehensive rehab program.  PAST MEDICAL HISTORY:  See discharge diagnoses.  SOCIAL HISTORY:  Lives alone.  Independent prior to admission.  FUNCTIONAL STATUS UPON ADMISSION TO REHAB SERVICES:  Moderate assist 100 feet one-person handheld assistance, minimal guard sit to stand, min to mod assist activities of daily living.  PHYSICAL EXAMINATION:   VITAL SIGNS:  Blood pressure 150/69, pulse 71, temperature 97, respirations 20. GENERAL:  This was an alert male, in no acute distress.  Oriented to person, place, and time. LUNGS:  Clear to auscultation without wheeze. CARDIAC:  Regular rate and rhythm.  No murmur. ABDOMEN:  Soft, nontender.  Good bowel sounds.  REHABILITATION HOSPITAL COURSE:  The patient was admitted to Inpatient Rehab Services with therapies initiated on a 3-hour daily basis, consisting of physical therapy, occupational therapy, and rehabilitation nursing.  The following issues were addressed during the patient's rehabilitation stay.  Pertaining to Mr. Obst right cerebellar infarct remained stable, maintained on aspirin and Plavix therapy.  He would follow up Neurology Services.  SCDs for DVT prophylaxis.  No signs of DVT.  He was using Xanax on a limited basis for anxiety with good results.  Blood pressures controlled on Toprol.  He would follow up with his primary MD.  History of coronary artery disease, remote myocardial infarction.  Continue Plavix and aspirin therapy.  No chest pain or shortness of breath.  He did have a history of COPD with remote tobacco abuse.  He was using Dulera twice daily.  Chronic renal insufficiency, latest creatinine 1.61, encouragement of fluids.  Bouts of constipation resolved with laxative assistance.  The patient received weekly collaborative interdisciplinary team conferences to discuss estimated length of stay, family  teaching, any barriers to discharge.  The patient ambulating with a small base quad cane distance supervision.  Instructed the patient on stair negotiation of 12 steps with a right hand rail and supervision.  High-level gait training through obstacle course, small base quad cane, stepping over and weaving around objects with supervision.  He could gather his belongings for activities of daily living and homemaking.  Worked on standing balance using a foam  surface in the gym while completing ball toss.  It was discussed no driving. Full teaching completed and plan discharge to home with ongoing therapies dictated per Rehab Services.  DISCHARGE MEDICATIONS: 1. Xanax 0.5 mg p.o. at bedtime as needed. 2. Aspirin 325 mg p.o. daily. 3. Lipitor 20 mg p.o. daily. 4. Plavix 75 mg p.o. daily. 5. Pepcid 20 mg p.o. b.i.d. 6. Toprol-XL 25 mg p.o. daily. 7. Dulera 2 puffs twice daily. 8. MiraLAX daily, hold for loose stools.  DIET:  Regular.  FOLLOWUP:  The patient would follow up with Dr. Delice Lesch, Rehab Service's office to call for appointment; Dr. Erlinda Hong, Neurology Service in 1 month; Dr. Jenna Luo, Medical Management.     Lauraine Rinne, P.A.   ______________________________ Delice Lesch, MD    DA/MEDQ  D:  07/25/2015  T:  07/25/2015  Job:  [id number]  cc:   Sharmon Leyden, MD

## 2015-07-25 NOTE — Patient Care Conference (Signed)
Inpatient RehabilitationTeam Conference and Plan of Care Update Date: 07/25/2015   Time: 2:50 PM    Patient Name: Martin [name]      Medical Record Number: [id number]  Date of Birth: June 06, 1930 Sex: Male         Room/Bed: 4M07C/4M07C-01 Payor Info: Payor: MEDICARE / Plan: MEDICARE PART A AND B / Product Type: *No Product type* /    Admitting Diagnosis: CVA  Admit Date/Time:  07/17/2015  5:50 PM Admission Comments: No comment available   Primary Diagnosis:  Cerebellar infarct [facility]) Principal Problem: Cerebellar infarct [facility])  Patient Active Problem List   Diagnosis Date Noted  . Slow transit constipation   . Nausea without vomiting   . Essential hypertension   . Anxiety state   . Cerebellar infarct ([place]) 07/17/2015  . Cerebellar stroke ([place])   . Coronary artery disease involving native coronary artery of native heart without angina pectoris   . Chronic obstructive pulmonary disease ([place])   . CKD (chronic kidney disease)   . Leukocytosis   . Thrombocytopenia ([place])   . Cerebrovascular accident (CVA) ([place])   . Stroke ([place]) 07/15/2015  . CVA (cerebral infarction) 07/15/2015  . Hypothermia 07/15/2015  . Atherosclerotic PVD with intermittent claudication ([place]) 12/29/2013  . Aftercare following surgery of the circulatory system, [place] 12/23/2012  . Atherosclerosis of native arteries of the extremities with intermittent claudication 06/17/2012  . Diabetes mellitus, type 2 ([place]) 06/11/2012  . Peripheral vascular disease, unspecified ([place]) 05/29/2011  . HYPERLIPIDEMIA 08/14/2009  . Hypertension 08/14/2009  . MYOCARDIAL INFARCTION 08/14/2009  . ATRIAL FIBRILLATION 08/14/2009  . COPD 08/14/2009  . TOBACCO ABUSE, HX OF 08/14/2009    Expected Discharge Date: Expected Discharge Date: 07/26/15  Team Members Present: Physician leading conference: Dr. Alger Simons Social Worker Present: Lennart Pall, LCSW Nurse Present: Dorien Chihuahua, RN PT Present: Dwyane Dee, PT OT Present:  Benay Pillow, OT SLP Present: Weston Anna, SLP PPS Coordinator present : Daiva Nakayama, RN, CRRN     Current Status/Progress Goal Weekly Team Focus  Medical   Gait ataxia/dysmetria secondary to right cerebellar infarct on 07/15/2015  Improve safety  see above   Bowel/Bladder   continent of Bowel and Bladder LBM 5/15  Patient to remain continent of Bowel and Bladder while on RH  Patient to remain continent of bowel and bladder   Swallow/Nutrition/ Hydration             ADL's   supervision overall  Mod I overall with RW  self care retraining, balance, safety, pt edu, d/c planning   Mobility   distant supervision, mod I  mod I overall with RW  grad day Wednesday, patient education, d/c planning   Communication             Safety/Cognition/ Behavioral Observations            Pain   patient denied any pain or discomfort  <3  Assess and treat pain q shift and as needed   Skin   No new skin issue  Patient skin to be free of infection/breakdown  Assess patient skin for changes Q shift and as neeeded.    Rehab Goals Patient on target to meet rehab goals: Yes *See Care Plan and progress notes for long and short-term goals.  Barriers to Discharge: Cont therapy, progressing, plan for d/c tomorrow, labs for CKD    Possible Resolutions to Barriers:  Follow labs, cont therapies, see above    Discharge Planning/Teaching Needs:  Home with intermittent  support of family/ friends      Team Discussion:  Has met all goals and ready for d/c tomorrow.  Revisions to Treatment Plan:  None   Continued Need for Acute Rehabilitation Level of Care: The patient requires daily medical management by a physician with specialized training in physical medicine and rehabilitation for the following conditions: Daily direction of a multidisciplinary physical rehabilitation program to ensure safe treatment while eliciting the highest outcome that is of practical value to the patient.: Yes Daily medical  management of patient stability for increased activity during participation in an intensive rehabilitation regime.: Yes Daily analysis of laboratory values and/or radiology reports with any subsequent need for medication adjustment of medical intervention for : Neurological problems;Renal problems  Jaquel Glassburn, [place] 07/26/2015, 8:28 AM

## 2015-07-25 NOTE — Progress Notes (Signed)
[place] PHYSICAL MEDICINE & REHABILITATION     PROGRESS NOTE  Subjective/Complaints:  Pt seen ambulating from bathroom to bed with rolling walker. He dropped his shorts, but was able to pick them up.  He is in good spirits.    ROS:  Denies CP, SOB, vomiting, diarrhea.  Objective: Vital Signs: Blood pressure 130/55, pulse 75, temperature 98.5 F (36.9 C), temperature source Oral, resp. rate 17, weight 65.1 kg (143 lb 8.3 oz), SpO2 96 %. No results found.  Recent Labs  07/23/15 0858  WBC 8.2  HGB 14.2  HCT 41.9  PLT 143*    Recent Labs  07/23/15 0858  NA 133*  K 3.6  CL 99*  GLUCOSE 313*  BUN 35*  CREATININE 1.61*  CALCIUM 9.1   CBG (last 3)  No results for input(s): GLUCAP in the last 72 hours.  Wt Readings from Last 3 Encounters:  07/18/15 65.1 kg (143 lb 8.3 oz)  07/15/15 66.225 kg (146 lb)  01/04/15 64.864 kg (143 lb)    Physical Exam:  BP 130/55 mmHg  Pulse 75  Temp(Src) 98.5 F (36.9 C) (Oral)  Resp 17  Wt 65.1 kg (143 lb 8.3 oz)  SpO2 96% Constitutional:  He appears well-developed and well-nourished. NAD. Vital signs reviewed. HENT: Normocephalic and atraumatic.  Eyes: Conjunctivae and EOM are normal.  Cardiovascular: Normal rate and regular rhythm.  Respiratory: Effort normal. No respiratory distress.  GI: Soft. Bowel sounds are normal. He exhibits no distension.  Musculoskeletal: He exhibits no edema or tenderness.  Neurological: He is alert and oriented.  Speech is clear.  Motor:  Right upper extremity: 5/5 proximal to distal Left upper extremity: 5/5 proximal to distal Right lower extremity: 5/5 proximal to distal Left lower extremity: 5/5 proximal to distal  Right sided: No significant ataxia or dysmetria, but with slow and deliberate movements.  Skin: Skin is warm and dry.  Psychiatric: He has a normal mood and affect. His behavior is normal. Thought content normal  Assessment/Plan: 1. Functional deficits secondary to right  cerebellar infarct which require 3+ hours per day of interdisciplinary therapy in a comprehensive inpatient rehab setting. Physiatrist is providing close team supervision and 24 hour management of active medical problems listed below. Physiatrist and rehab team continue to assess barriers to discharge/monitor patient progress toward functional and medical goals.  Function:  Bathing Bathing position Bathing activity did not occur: Refused Position: Production manager parts bathed by patient: Right arm, Left arm, Chest, Abdomen, Front perineal area, Buttocks, Right upper leg, Left upper leg, Right lower leg, Left lower leg Body parts bathed by helper: Back  Bathing assist Assist Level: More than reasonable time      Upper Body Dressing/Undressing Upper body dressing Upper body dressing/undressing activity did not occur: Refused What is the patient wearing?: Pull over shirt/dress, Button up shirt     Pull over shirt/dress - Perfomed by patient: Thread/unthread right sleeve, Thread/unthread left sleeve, Put head through opening, Pull shirt over trunk   Button up shirt - Perfomed by patient: Thread/unthread right sleeve, Thread/unthread left sleeve, Button/unbutton shirt, Pull shirt around back Button up shirt - Perfomed by helper: Pull shirt around back    Upper body assist Assist Level: More than reasonable time   Set up : To obtain clothing/put away  Lower Body Dressing/Undressing Lower body dressing Lower body dressing/undressing activity did not occur: Refused What is the patient wearing?: Non-skid slipper socks, Underwear, Pants, Shoes Underwear - Performed by patient: Thread/unthread  right underwear leg, Thread/unthread left underwear leg, Pull underwear up/down   Pants- Performed by patient: Thread/unthread right pants leg, Thread/unthread left pants leg, Pull pants up/down, Fasten/unfasten pants   Non-skid slipper socks- Performed by patient: Don/doff right sock,  Don/doff left sock       Shoes - Performed by patient: Don/doff right shoe, Don/doff left shoe            Lower body assist Assist for lower body dressing: More than reasonable time      Toileting Toileting   Toileting steps completed by patient: Adjust clothing prior to toileting, Performs perineal hygiene, Adjust clothing after toileting   Toileting Assistive Devices: Grab bar or rail  Toileting assist Assist level: More than reasonable time   Transfers Chair/bed transfer   Chair/bed transfer method: Ambulatory Chair/bed transfer assist level: No Help, no cues, assistive device, takes more than a reasonable amount of time Chair/bed transfer assistive device: Armrests, Medical sales representative     Max distance: 150 Assist level: Supervision or verbal cues   Wheelchair          Cognition Comprehension Comprehension assist level: Follows complex conversation/direction with no assist  Expression Expression assist level: Expresses complex ideas: With no assist  Social Interaction Social Interaction assist level: Interacts appropriately with others - No medications needed.  Problem Solving Problem solving assist level: Solves complex problems: Recognizes & self-corrects  Memory Memory assist level: Complete Independence: No helper    Medical Problem List and Plan: 1. Gait ataxia/dysmetria secondary to right cerebellar infarct on 07/15/2015.    Follow-up cranial CT scan 07/18/2015, reviewed, no significant changes  Continue CIR  Plan for D/c tomorrow, will see pt for transitional care management in 1-2 weeks 2. DVT Prophylaxis/Anticoagulation: SCDs. Monitor for any signs of DVT 3. Pain Management: Tylenol as needed 4. Mood: Xanax 0.5 mg bedtime as needed 5. Neuropsych: This patient is capable of making decisions on his own behalf. 6. Skin/Wound Care: Routine skin checks 7. Fluids/Electrolytes/Nutrition: Routine I&O's 8. Hypertension.   Cont Toprol 25 mg  daily.   Blood pressure relatively well controlled   Will consider Increasing medications if necessary 9. CAD with remote MI. No chest pain or shortness of breath. Continue aspirin and PlavixMonitor with increased mobility 10. COPD with remote tobacco abuse. Continue Dulera twice daily 11. Hyperlipidemia. Lipitor 12. CKD:  Creatinine 1.61 on 5/15  Encourage fluid intake  Labs ordered for tomorrow 12. Constipation: Resolved  Increased bowel regimen on 5/11 13. Nausea:   Encouraged patient to ask for when necessary antiemetic when necessary  Negative family improved  LOS (Days) 8 A FACE TO FACE EVALUATION WAS PERFORMED  Ankit Lorie Phenix 07/25/2015 9:22 AM

## 2015-07-26 ENCOUNTER — Telehealth: Payer: Self-pay | Admitting: Family Medicine

## 2015-07-26 ENCOUNTER — Other Ambulatory Visit: Payer: Self-pay | Admitting: Family Medicine

## 2015-07-26 LAB — BASIC METABOLIC PANEL
Anion gap: 12 (ref 5–15)
BUN: 31 mg/dL — ABNORMAL HIGH (ref 6–20)
CALCIUM: 9 mg/dL (ref 8.9–10.3)
CHLORIDE: 100 mmol/L — AB (ref 101–111)
CO2: 24 mmol/L (ref 22–32)
CREATININE: 1.18 mg/dL (ref 0.61–1.24)
GFR, EST NON AFRICAN AMERICAN: 56 mL/min — AB (ref >60)
Glucose, Bld: 113 mg/dL — ABNORMAL HIGH (ref 65–99)
Potassium: 4.1 mmol/L (ref 3.5–5.1)
SODIUM: 136 mmol/L (ref 135–145)

## 2015-07-26 LAB — CBC WITH DIFFERENTIAL/PLATELET
BASOS PCT: 0 %
Basophils Absolute: 0 10*3/uL (ref 0.0–0.1)
EOS ABS: 0.4 10*3/uL (ref 0.0–0.7)
EOS PCT: 5 %
HCT: 42.2 % (ref 39.0–52.0)
Hemoglobin: 13.9 g/dL (ref 13.0–17.0)
LYMPHS ABS: 1.5 10*3/uL (ref 0.7–4.0)
Lymphocytes Relative: 18 %
MCH: 31.4 pg (ref 26.0–34.0)
MCHC: 32.9 g/dL (ref 30.0–36.0)
MCV: 95.5 fL (ref 78.0–100.0)
Monocytes Absolute: 0.7 10*3/uL (ref 0.1–1.0)
Monocytes Relative: 9 %
Neutro Abs: 5.3 10*3/uL (ref 1.7–7.7)
Neutrophils Relative %: 68 %
PLATELETS: 175 10*3/uL (ref 150–400)
RBC: 4.42 MIL/uL (ref 4.22–5.81)
RDW: 12.5 % (ref 11.5–15.5)
WBC: 7.9 10*3/uL (ref 4.0–10.5)

## 2015-07-26 MED ORDER — CLOPIDOGREL BISULFATE 75 MG PO TABS: 75.0000 mg | ORAL_TABLET | Freq: Every day | ORAL | Status: DC

## 2015-07-26 MED ORDER — FAMOTIDINE 20 MG PO TABS: 20.0000 mg | ORAL_TABLET | Freq: Two times a day (BID) | ORAL | Status: DC

## 2015-07-26 MED ORDER — METOPROLOL SUCCINATE ER 25 MG PO TB24: 25.0000 mg | ORAL_TABLET | Freq: Every day | ORAL | Status: DC

## 2015-07-26 MED ORDER — POLYETHYLENE GLYCOL 3350 17 G PO PACK: 17.0000 g | PACK | Freq: Every day | ORAL | Status: DC

## 2015-07-26 MED ORDER — ATORVASTATIN CALCIUM 20 MG PO TABS: 20.0000 mg | ORAL_TABLET | Freq: Every day | ORAL | Status: DC

## 2015-07-26 NOTE — Progress Notes (Signed)
Social Work  Discharge Note  The overall goal for the admission was met for:   Discharge location: Yes - return to his home  Length of Stay: Yes - 9 days  Discharge activity level: Yes - modified independent  Home/community participation: Yes  Services provided included: MD, RD, PT, OT, SLP, RN, TR, Pharmacy, Neuropsych and SW  Financial Services: Medicare and Private Insurance: [employer] manager medical  Follow-up services arranged: Home Health: RN, PT, OT via [place] and Patient/Family has no preference for HH/DME agencies  Comments (or additional information):  Patient/Family verbalized understanding of follow-up arrangements: Yes  Individual responsible for coordination of the follow-up plan: patient  Confirmed correct DME delivered: NA no DME needs    Martin [name]

## 2015-07-26 NOTE — Progress Notes (Signed)
[place] PHYSICAL MEDICINE & REHABILITATION     PROGRESS NOTE  Subjective/Complaints:  Patient sitting up at the edge of his bed laughing. He is anxious to go home today.  ROS:  Denies CP, SOB, vomiting, diarrhea.  Objective: Vital Signs: Blood pressure 129/60, pulse 77, temperature 98.6 F (37 C), temperature source Oral, resp. rate 18, weight 64.955 kg (143 lb 3.2 oz), SpO2 98 %. No results found.  Recent Labs  07/23/15 0858 07/26/15 0605  WBC 8.2 7.9  HGB 14.2 13.9  HCT 41.9 42.2  PLT 143* 175    Recent Labs  07/23/15 0858 07/26/15 0605  NA 133* 136  K 3.6 4.1  CL 99* 100*  GLUCOSE 313* 113*  BUN 35* 31*  CREATININE 1.61* 1.18  CALCIUM 9.1 9.0   CBG (last 3)  No results for input(s): GLUCAP in the last 72 hours.  Wt Readings from Last 3 Encounters:  07/25/15 64.955 kg (143 lb 3.2 oz)  07/15/15 66.225 kg (146 lb)  01/04/15 64.864 kg (143 lb)    Physical Exam:  BP 129/60 mmHg  Pulse 77  Temp(Src) 98.6 F (37 C) (Oral)  Resp 18  Wt 64.955 kg (143 lb 3.2 oz)  SpO2 98% Constitutional:  He appears well-developed and well-nourished. NAD. Vital signs reviewed. HENT: Normocephalic and atraumatic.  Eyes: Conjunctivae and EOM are normal.  Cardiovascular: Normal rate and regular rhythm.  Respiratory: Effort normal. No respiratory distress.  GI: Soft. Bowel sounds are normal. He exhibits no distension.  Musculoskeletal: He exhibits no edema or tenderness.  Neurological: He is alert and oriented.  Speech is clear.  Motor:  Right upper extremity: 5/5 proximal to distal Left upper extremity: 5/5 proximal to distal Right lower extremity: 5/5 proximal to distal Left lower extremity: 5/5 proximal to distal  Right sided: No significant ataxia or dysmetria, but with slow and deliberate movements (Slightly improved).  Skin: Skin is warm and dry.  Psychiatric: He has a normal mood and affect. His behavior is normal. Thought content  normal  Assessment/Plan: 1. Functional deficits secondary to right cerebellar infarct which require 3+ hours per day of interdisciplinary therapy in a comprehensive inpatient rehab setting. Physiatrist is providing close team supervision and 24 hour management of active medical problems listed below. Physiatrist and rehab team continue to assess barriers to discharge/monitor patient progress toward functional and medical goals.  Function:  Bathing Bathing position Bathing activity did not occur: Refused Position: Production manager parts bathed by patient: Right arm, Left arm, Chest, Abdomen, Front perineal area, Buttocks, Right upper leg, Left upper leg, Right lower leg, Left lower leg Body parts bathed by helper: Back  Bathing assist Assist Level: More than reasonable time      Upper Body Dressing/Undressing Upper body dressing Upper body dressing/undressing activity did not occur: Refused What is the patient wearing?: Pull over shirt/dress, Button up shirt     Pull over shirt/dress - Perfomed by patient: Thread/unthread right sleeve, Thread/unthread left sleeve, Put head through opening, Pull shirt over trunk   Button up shirt - Perfomed by patient: Thread/unthread right sleeve, Thread/unthread left sleeve, Button/unbutton shirt, Pull shirt around back Button up shirt - Perfomed by helper: Pull shirt around back    Upper body assist Assist Level: More than reasonable time   Set up : To obtain clothing/put away  Lower Body Dressing/Undressing Lower body dressing Lower body dressing/undressing activity did not occur: Refused What is the patient wearing?: Non-skid slipper socks, Underwear, Pants, Shoes Underwear -  Performed by patient: Thread/unthread right underwear leg, Thread/unthread left underwear leg, Pull underwear up/down   Pants- Performed by patient: Thread/unthread right pants leg, Thread/unthread left pants leg, Pull pants up/down, Fasten/unfasten pants    Non-skid slipper socks- Performed by patient: Don/doff right sock, Don/doff left sock       Shoes - Performed by patient: Don/doff right shoe, Don/doff left shoe            Lower body assist Assist for lower body dressing: More than reasonable time      Toileting Toileting   Toileting steps completed by patient: Adjust clothing prior to toileting, Performs perineal hygiene, Adjust clothing after toileting   Toileting Assistive Devices: Grab bar or rail  Toileting assist Assist level: No help/no cues   Transfers Chair/bed transfer   Chair/bed transfer method: Stand pivot, Ambulatory Chair/bed transfer assist level: No Help, no cues, assistive device, takes more than a reasonable amount of time Chair/bed transfer assistive device: Armrests, Medical sales representative     Max distance: 160 Assist level: No help, No cues, assistive device, takes more than a reasonable amount of time   Wheelchair          Cognition Comprehension Comprehension assist level: Follows complex conversation/direction with no assist  Expression Expression assist level: Expresses complex ideas: With no assist  Social Interaction Social Interaction assist level: Interacts appropriately with others - No medications needed.  Problem Solving Problem solving assist level: Solves complex problems: Recognizes & self-corrects  Memory Memory assist level: Complete Independence: No helper    Medical Problem List and Plan: 1. Gait ataxia/dysmetria secondary to right cerebellar infarct on 07/15/2015.    Follow-up cranial CT scan 07/18/2015, reviewed, no significant changes  DC today  Will see pt for transitional care management in 1-2 weeks 2. DVT Prophylaxis/Anticoagulation: SCDs. Monitor for any signs of DVT 3. Pain Management: Tylenol as needed 4. Mood: Xanax 0.5 mg bedtime as needed 5. Neuropsych: This patient is capable of making decisions on his own behalf. 6. Skin/Wound Care: Routine  skin checks 7. Fluids/Electrolytes/Nutrition: Routine I&O's 8. Hypertension.   Cont Toprol 25 mg daily.   Blood pressure relatively well controlled   Will consider Increasing medications if necessary 9. CAD with remote MI. No chest pain or shortness of breath. Continue aspirin and PlavixMonitor with increased mobility 10. COPD with remote tobacco abuse. Continue Dulera twice daily 11. Hyperlipidemia. Lipitor 12. CKD:  Creatinine 1.18 on 5/18, significantly improved with increase in fluid intake 12. Constipation: Resolved  Increased bowel regimen on 5/11 13. Nausea:   Encouraged patient to ask for when necessary antiemetic when necessary  Negative family improved  LOS (Days) 9 A FACE TO FACE EVALUATION WAS PERFORMED  Giulia Hickey Lorie Phenix 07/26/2015 8:35 AM

## 2015-07-26 NOTE — Discharge Instructions (Signed)
Inpatient Rehab Discharge Instructions  [name] Discharge date and time: No discharge date for patient encounter.   Activities/Precautions/ Functional Status: Activity: activity as tolerated Diet: regular diet Wound Care: none needed Functional status:  ___ No restrictions     ___ Walk up steps independently ___ 24/7 supervision/assistance   ___ Walk up steps with assistance ___ Intermittent supervision/assistance  ___ Bathe/dress independently ___ Walk with walker     _x__ Bathe/dress with assistance ___ Walk Independently    ___ Shower independently ___ Walk with assistance    ___ Shower with assistance ___ No alcohol     ___ Return to work/school ________   COMMUNITY REFERRALS UPON DISCHARGE:    Home Health:   PT     OT     RN                     Agency:  [facility] Phone: [phone]      Special Instructions: No driving   Follow-up with Dr. Posey Pronto rehabilitation services [address] [place], [place]. Office to call for appointment    [place] Cigarette smoking nearly doubles your risk of having a stroke & is the single most alterable risk factor  If you smoke or have smoked in the last 12 months, you are advised to quit smoking for your health.  Most of the excess cardiovascular risk related to smoking disappears within a year of stopping.  Ask you doctor about anti-smoking medications  [place] Quit Line: 1-800-QUIT NOW  Free Smoking Cessation Classes [phone]  CHOLESTEROL Know your levels; limit fat & cholesterol in your diet  Lipid Panel     Component Value Date/Time   CHOL 177 07/16/2015 0501   TRIG 152* 07/16/2015 0501   HDL 46 07/16/2015 0501   CHOLHDL 3.8 07/16/2015 0501   VLDL 30 07/16/2015 0501   LDLCALC 101* 07/16/2015 0501      Many patients benefit from treatment even if their cholesterol is at goal.  Goal: Total Cholesterol (CHOL) less than 160  Goal:  Triglycerides (TRIG)  less than 150  Goal:  HDL greater than 40  Goal:  LDL (LDLCALC) less than 100   BLOOD PRESSURE American Stroke Association blood pressure target is less that 120/80 mm/Hg  Your discharge blood pressure is:  BP: 130/64 mmHg  Monitor your blood pressure  Limit your salt and alcohol intake  Many individuals will require more than one medication for high blood pressure  DIABETES (A1c is a blood sugar average for last 3 months) Goal HGBA1c is under 7% (HBGA1c is blood sugar average for last 3 months)  Diabetes: No known diagnosis of diabetes    Lab Results  Component Value Date   HGBA1C 6.0* 07/16/2015     Your HGBA1c can be lowered with medications, healthy diet, and exercise.  Check your blood sugar as directed by your physician  Call your physician if you experience unexplained or low blood sugars.  PHYSICAL ACTIVITY/REHABILITATION Goal is 30 minutes at least 4 days per week  Activity: Increase activity slowly, Therapies: Physical Therapy: Home Health Return to work:   Activity decreases your risk of heart attack and stroke and makes your heart stronger.  It helps control your weight and blood pressure; helps you relax and can improve your mood.  Participate in a regular exercise program.  Talk with your doctor about the best form of exercise for you (dancing, walking, swimming, cycling).  DIET/WEIGHT Goal  is to maintain a healthy weight  Your discharge diet is: Diet Heart Room service appropriate?: Yes; Fluid consistency:: Thin  liquids Your height is:    Your current weight is: Weight: 65.1 kg (143 lb 8.3 oz) Your Body Mass Index (BMI) is:     Following the type of diet specifically designed for you will help prevent another stroke.  Your goal weight range is:    Your goal Body Mass Index (BMI) is 19-24.  Healthy food habits can help reduce 3 risk factors for stroke:  High cholesterol, hypertension, and excess weight.  RESOURCES Stroke/Support Group:  Call  [phone]   STROKE EDUCATION PROVIDED/REVIEWED AND GIVEN TO PATIENT Stroke warning signs and symptoms How to activate emergency medical system (call 911). Medications prescribed at discharge. Need for follow-up after discharge. Personal risk factors for stroke. Pneumonia vaccine given:  Flu vaccine given:  My questions have been answered, the writing is legible, and I understand these instructions.  I will adhere to these goals & educational materials that have been provided to me after my discharge from the hospital.       My questions have been answered and I understand these instructions. I will adhere to these goals and the provided educational materials after my discharge from the hospital.  Patient/Caregiver Signature _______________________________ Date __________  Clinician Signature _______________________________________ Date __________  Please bring this form and your medication list with you to all your follow-up doctor's appointments.

## 2015-07-26 NOTE — Telephone Encounter (Signed)
Pt currently in hospital, med refills denied.  Look like Martin [name] was discontinued anyway

## 2015-07-26 NOTE — Telephone Encounter (Signed)
Pt was discharged today from the hospital after having a stroke. He states that they have taken him off of Diltiazem and he wants to know if Dr. Dennard Schaumann thinks he should stay off of it. Please advise [phone]

## 2015-07-26 NOTE — Progress Notes (Signed)
Pt discharged to home with 1135. Discharge instructions given by Wendy Poet, PA with verbal understanding. All belongings with pt

## 2015-07-26 NOTE — Progress Notes (Signed)
Social Work Patient ID: MANPREET STREY, male   DOB: [birth year], 82 y.o.   MRN: [id number]   Lowella Curb, LCSW Social Worker Signed  Patient Care Conference 07/25/2015  4:19 PM    Expand All Collapse All   Inpatient RehabilitationTeam Conference and Plan of Care Update Date: 07/25/2015   Time: 2:50 PM     Patient Name: Martin [name]       Medical Record Number: [id number]  Date of Birth: [birth year] Sex: Male         Room/Bed: 4M07C/4M07C-01 Payor Info: Payor: MEDICARE / Plan: MEDICARE PART A AND B / Product Type: *No Product type* /    Admitting Diagnosis: CVA  Admit Date/Time:  07/17/2015  5:50 PM Admission Comments: No comment available   Primary Diagnosis:  Cerebellar infarct [facility]) Principal Problem: Cerebellar infarct [facility])    Patient Active Problem List     Diagnosis  Date Noted   .  Slow transit constipation     .  Nausea without vomiting     .  Essential hypertension     .  Anxiety state     .  Cerebellar infarct ([place])  07/17/2015   .  Cerebellar stroke ([place])     .  Coronary artery disease involving native coronary artery of native heart without angina pectoris     .  Chronic obstructive pulmonary disease ([place])     .  CKD (chronic kidney disease)     .  Leukocytosis     .  Thrombocytopenia ([place])     .  Cerebrovascular accident (CVA) ([place])     .  Stroke ([place])  07/15/2015   .  CVA (cerebral infarction)  07/15/2015   .  Hypothermia  07/15/2015   .  Atherosclerotic PVD with intermittent claudication ([place])  12/29/2013   .  Aftercare following surgery of the circulatory system, [place]  12/23/2012   .  Atherosclerosis of native arteries of the extremities with intermittent claudication  06/17/2012   .  Diabetes mellitus, type 2 ([place])  06/11/2012   .  Peripheral vascular disease, unspecified ([place])  05/29/2011   .  HYPERLIPIDEMIA  08/14/2009   .  Hypertension  08/14/2009   .  MYOCARDIAL INFARCTION  08/14/2009   .  ATRIAL FIBRILLATION  08/14/2009   .  COPD  08/14/2009   .   TOBACCO ABUSE, HX OF  08/14/2009     Expected Discharge Date: Expected Discharge Date: 07/26/15  Team Members Present: Physician leading conference: Dr. Alger Simons Social Worker Present: Lennart Pall, LCSW Nurse Present: Dorien Chihuahua, RN PT Present: Dwyane Dee, PT OT Present: Benay Pillow, OT SLP Present: Weston Anna, SLP PPS Coordinator present : Daiva Nakayama, RN, CRRN        Current Status/Progress  Goal  Weekly Team Focus   Medical     Gait ataxia/dysmetria secondary to right cerebellar infarct on 07/15/2015  Improve safety  see above   Bowel/Bladder     continent of Bowel and Bladder LBM 5/15   Patient to remain continent of Bowel and Bladder while on RH   Patient to remain continent of bowel and bladder    Swallow/Nutrition/ Hydration               ADL's     supervision overall  Mod I overall with RW  self care retraining, balance, safety, pt edu, d/c planning    Mobility     distant supervision, mod I  mod I overall  with RW  grad day Wednesday, patient education, d/c planning    Communication               Safety/Cognition/ Behavioral Observations              Pain     patient denied any pain or discomfort   <3  Assess and treat pain q shift and as needed    Skin     No new skin issue  Patient skin to be free of infection/breakdown  Assess patient skin for changes Q shift and as neeeded.     Rehab Goals Patient on target to meet rehab goals: Yes *See Care Plan and progress notes for long and short-term goals.    Barriers to Discharge:  Cont therapy, progressing, plan for d/c tomorrow, labs for CKD      Possible Resolutions to Barriers:   Follow labs, cont therapies, see above      Discharge Planning/Teaching Needs:   Home with intermittent support of family/ friends        Team Discussion:    Has met all goals and ready for d/c tomorrow.   Revisions to Treatment Plan:    None    Continued Need for Acute Rehabilitation Level of Care: The  patient requires daily medical management by a physician with specialized training in physical medicine and rehabilitation for the following conditions: Daily direction of a multidisciplinary physical rehabilitation program to ensure safe treatment while eliciting the highest outcome that is of practical value to the patient.: Yes Daily medical management of patient stability for increased activity during participation in an intensive rehabilitation regime.: Yes Daily analysis of laboratory values and/or radiology reports with any subsequent need for medication adjustment of medical intervention for : Neurological problems;Renal problems  HOYLE, [place] 07/26/2015, 8:28 AM

## 2015-07-26 NOTE — Telephone Encounter (Signed)
Told patient he should follow the medications as listed on his discharge summary.

## 2015-07-27 ENCOUNTER — Telehealth: Payer: Self-pay

## 2015-07-27 DIAGNOSIS — E785 Hyperlipidemia, unspecified: Secondary | ICD-10-CM | POA: Diagnosis not present

## 2015-07-27 DIAGNOSIS — F419 Anxiety disorder, unspecified: Secondary | ICD-10-CM | POA: Diagnosis not present

## 2015-07-27 DIAGNOSIS — J449 Chronic obstructive pulmonary disease, unspecified: Secondary | ICD-10-CM | POA: Diagnosis not present

## 2015-07-27 DIAGNOSIS — Z7982 Long term (current) use of aspirin: Secondary | ICD-10-CM | POA: Diagnosis not present

## 2015-07-27 DIAGNOSIS — I2510 Atherosclerotic heart disease of native coronary artery without angina pectoris: Secondary | ICD-10-CM | POA: Diagnosis not present

## 2015-07-27 DIAGNOSIS — Z87891 Personal history of nicotine dependence: Secondary | ICD-10-CM | POA: Diagnosis not present

## 2015-07-27 DIAGNOSIS — K219 Gastro-esophageal reflux disease without esophagitis: Secondary | ICD-10-CM | POA: Diagnosis not present

## 2015-07-27 DIAGNOSIS — E1151 Type 2 diabetes mellitus with diabetic peripheral angiopathy without gangrene: Secondary | ICD-10-CM | POA: Diagnosis not present

## 2015-07-27 DIAGNOSIS — I10 Essential (primary) hypertension: Secondary | ICD-10-CM | POA: Diagnosis not present

## 2015-07-27 DIAGNOSIS — M150 Primary generalized (osteo)arthritis: Secondary | ICD-10-CM | POA: Diagnosis not present

## 2015-07-27 DIAGNOSIS — I69351 Hemiplegia and hemiparesis following cerebral infarction affecting right dominant side: Secondary | ICD-10-CM | POA: Diagnosis not present

## 2015-07-27 DIAGNOSIS — I252 Old myocardial infarction: Secondary | ICD-10-CM | POA: Diagnosis not present

## 2015-07-27 NOTE — Telephone Encounter (Signed)
Transitional Care Questions   1. Are you/is patient experiencing any problems since coming home? No Problems.  Are there any questions regarding any aspect of care? None at this time.   2. Are there any questions regarding medications administration/dosing? None. States that Primary MD has helped with this question.  Are meds being taken as prescribed? Yes. Patient should review meds with caller to confirm. Reviewed medications with patient.   3. Have there been any falls? None.   4. Has Home Health been to the house and/or have they contacted you? Not yet. States that he is set up with [facility].  If not, have you tried to contact them? Can we help you contact them?   5. Are bowels and bladder emptying properly? Yes. No Issues at this time.  Are there any unexpected incontinence issues? None.If applicable, is patient following bowel/bladder programs?   6. Any fevers, problems with breathing, unexpected pain? No fevers, no problems, and no unexpected pain.   7. Are there any skin problems or new areas of breakdown? No issues at this time.   8. Has the patient/family member arranged specialty MD follow up (ie cardiology/neurology/renal/surgical/etc)? Can we help arrange? Yes. He has follow up appointments scheduled.   9. Does the patient need any other services or support that we can help arrange? Not at this time.   10. Are caregivers following through as expected in assisting the patient? Patient states that he has been having his neighbors come by and check on him.   11. Has the patient quit smoking, drinking alcohol, or using drugs as recommended? N/A  Patient has an appointment on May 31st at 1200pm with Dr. Delice Lesch. Patient advised and verbalized understanding. Patient packet was mailed out.

## 2015-07-30 DIAGNOSIS — I69351 Hemiplegia and hemiparesis following cerebral infarction affecting right dominant side: Secondary | ICD-10-CM | POA: Diagnosis not present

## 2015-07-30 DIAGNOSIS — F419 Anxiety disorder, unspecified: Secondary | ICD-10-CM | POA: Diagnosis not present

## 2015-07-30 DIAGNOSIS — I2510 Atherosclerotic heart disease of native coronary artery without angina pectoris: Secondary | ICD-10-CM | POA: Diagnosis not present

## 2015-07-30 DIAGNOSIS — I10 Essential (primary) hypertension: Secondary | ICD-10-CM | POA: Diagnosis not present

## 2015-07-30 DIAGNOSIS — J449 Chronic obstructive pulmonary disease, unspecified: Secondary | ICD-10-CM | POA: Diagnosis not present

## 2015-07-30 DIAGNOSIS — E1151 Type 2 diabetes mellitus with diabetic peripheral angiopathy without gangrene: Secondary | ICD-10-CM | POA: Diagnosis not present

## 2015-07-31 DIAGNOSIS — J449 Chronic obstructive pulmonary disease, unspecified: Secondary | ICD-10-CM | POA: Diagnosis not present

## 2015-07-31 DIAGNOSIS — E1151 Type 2 diabetes mellitus with diabetic peripheral angiopathy without gangrene: Secondary | ICD-10-CM | POA: Diagnosis not present

## 2015-07-31 DIAGNOSIS — F419 Anxiety disorder, unspecified: Secondary | ICD-10-CM | POA: Diagnosis not present

## 2015-07-31 DIAGNOSIS — I2510 Atherosclerotic heart disease of native coronary artery without angina pectoris: Secondary | ICD-10-CM | POA: Diagnosis not present

## 2015-07-31 DIAGNOSIS — I69351 Hemiplegia and hemiparesis following cerebral infarction affecting right dominant side: Secondary | ICD-10-CM | POA: Diagnosis not present

## 2015-07-31 DIAGNOSIS — I10 Essential (primary) hypertension: Secondary | ICD-10-CM | POA: Diagnosis not present

## 2015-08-01 DIAGNOSIS — F419 Anxiety disorder, unspecified: Secondary | ICD-10-CM | POA: Diagnosis not present

## 2015-08-01 DIAGNOSIS — I10 Essential (primary) hypertension: Secondary | ICD-10-CM | POA: Diagnosis not present

## 2015-08-01 DIAGNOSIS — E1151 Type 2 diabetes mellitus with diabetic peripheral angiopathy without gangrene: Secondary | ICD-10-CM | POA: Diagnosis not present

## 2015-08-01 DIAGNOSIS — I69351 Hemiplegia and hemiparesis following cerebral infarction affecting right dominant side: Secondary | ICD-10-CM | POA: Diagnosis not present

## 2015-08-01 DIAGNOSIS — I2510 Atherosclerotic heart disease of native coronary artery without angina pectoris: Secondary | ICD-10-CM | POA: Diagnosis not present

## 2015-08-01 DIAGNOSIS — J449 Chronic obstructive pulmonary disease, unspecified: Secondary | ICD-10-CM | POA: Diagnosis not present

## 2015-08-03 DIAGNOSIS — I69351 Hemiplegia and hemiparesis following cerebral infarction affecting right dominant side: Secondary | ICD-10-CM | POA: Diagnosis not present

## 2015-08-03 DIAGNOSIS — F419 Anxiety disorder, unspecified: Secondary | ICD-10-CM | POA: Diagnosis not present

## 2015-08-03 DIAGNOSIS — I2510 Atherosclerotic heart disease of native coronary artery without angina pectoris: Secondary | ICD-10-CM | POA: Diagnosis not present

## 2015-08-03 DIAGNOSIS — E1151 Type 2 diabetes mellitus with diabetic peripheral angiopathy without gangrene: Secondary | ICD-10-CM | POA: Diagnosis not present

## 2015-08-03 DIAGNOSIS — I10 Essential (primary) hypertension: Secondary | ICD-10-CM | POA: Diagnosis not present

## 2015-08-03 DIAGNOSIS — J449 Chronic obstructive pulmonary disease, unspecified: Secondary | ICD-10-CM | POA: Diagnosis not present

## 2015-08-06 DIAGNOSIS — I69351 Hemiplegia and hemiparesis following cerebral infarction affecting right dominant side: Secondary | ICD-10-CM | POA: Diagnosis not present

## 2015-08-06 DIAGNOSIS — F419 Anxiety disorder, unspecified: Secondary | ICD-10-CM | POA: Diagnosis not present

## 2015-08-06 DIAGNOSIS — I10 Essential (primary) hypertension: Secondary | ICD-10-CM | POA: Diagnosis not present

## 2015-08-06 DIAGNOSIS — I2510 Atherosclerotic heart disease of native coronary artery without angina pectoris: Secondary | ICD-10-CM | POA: Diagnosis not present

## 2015-08-06 DIAGNOSIS — E1151 Type 2 diabetes mellitus with diabetic peripheral angiopathy without gangrene: Secondary | ICD-10-CM | POA: Diagnosis not present

## 2015-08-06 DIAGNOSIS — J449 Chronic obstructive pulmonary disease, unspecified: Secondary | ICD-10-CM | POA: Diagnosis not present

## 2015-08-08 ENCOUNTER — Encounter: Payer: Self-pay | Admitting: Physical Medicine & Rehabilitation

## 2015-08-08 ENCOUNTER — Encounter: Payer: Medicare Other | Attending: Physical Medicine & Rehabilitation | Admitting: Physical Medicine & Rehabilitation

## 2015-08-08 VITALS — BP 163/84 | HR 72 | Resp 14

## 2015-08-08 DIAGNOSIS — R110 Nausea: Secondary | ICD-10-CM | POA: Insufficient documentation

## 2015-08-08 DIAGNOSIS — I2510 Atherosclerotic heart disease of native coronary artery without angina pectoris: Secondary | ICD-10-CM | POA: Insufficient documentation

## 2015-08-08 DIAGNOSIS — E1122 Type 2 diabetes mellitus with diabetic chronic kidney disease: Secondary | ICD-10-CM | POA: Diagnosis not present

## 2015-08-08 DIAGNOSIS — I6990 Unspecified sequelae of unspecified cerebrovascular disease: Secondary | ICD-10-CM | POA: Diagnosis not present

## 2015-08-08 DIAGNOSIS — M1990 Unspecified osteoarthritis, unspecified site: Secondary | ICD-10-CM | POA: Diagnosis not present

## 2015-08-08 DIAGNOSIS — I639 Cerebral infarction, unspecified: Secondary | ICD-10-CM | POA: Insufficient documentation

## 2015-08-08 DIAGNOSIS — G4700 Insomnia, unspecified: Secondary | ICD-10-CM | POA: Insufficient documentation

## 2015-08-08 DIAGNOSIS — I69351 Hemiplegia and hemiparesis following cerebral infarction affecting right dominant side: Secondary | ICD-10-CM | POA: Diagnosis not present

## 2015-08-08 DIAGNOSIS — E785 Hyperlipidemia, unspecified: Secondary | ICD-10-CM | POA: Diagnosis not present

## 2015-08-08 DIAGNOSIS — I739 Peripheral vascular disease, unspecified: Secondary | ICD-10-CM | POA: Diagnosis not present

## 2015-08-08 DIAGNOSIS — I252 Old myocardial infarction: Secondary | ICD-10-CM | POA: Insufficient documentation

## 2015-08-08 DIAGNOSIS — I701 Atherosclerosis of renal artery: Secondary | ICD-10-CM | POA: Insufficient documentation

## 2015-08-08 DIAGNOSIS — F419 Anxiety disorder, unspecified: Secondary | ICD-10-CM | POA: Diagnosis not present

## 2015-08-08 DIAGNOSIS — Z87891 Personal history of nicotine dependence: Secondary | ICD-10-CM | POA: Insufficient documentation

## 2015-08-08 DIAGNOSIS — K219 Gastro-esophageal reflux disease without esophagitis: Secondary | ICD-10-CM | POA: Diagnosis not present

## 2015-08-08 DIAGNOSIS — I129 Hypertensive chronic kidney disease with stage 1 through stage 4 chronic kidney disease, or unspecified chronic kidney disease: Secondary | ICD-10-CM | POA: Diagnosis not present

## 2015-08-08 DIAGNOSIS — J449 Chronic obstructive pulmonary disease, unspecified: Secondary | ICD-10-CM | POA: Insufficient documentation

## 2015-08-08 DIAGNOSIS — N189 Chronic kidney disease, unspecified: Secondary | ICD-10-CM | POA: Diagnosis not present

## 2015-08-08 DIAGNOSIS — Z79899 Other long term (current) drug therapy: Secondary | ICD-10-CM | POA: Diagnosis not present

## 2015-08-08 DIAGNOSIS — K449 Diaphragmatic hernia without obstruction or gangrene: Secondary | ICD-10-CM | POA: Diagnosis not present

## 2015-08-08 DIAGNOSIS — E1151 Type 2 diabetes mellitus with diabetic peripheral angiopathy without gangrene: Secondary | ICD-10-CM | POA: Diagnosis not present

## 2015-08-08 DIAGNOSIS — I10 Essential (primary) hypertension: Secondary | ICD-10-CM | POA: Diagnosis not present

## 2015-08-08 DIAGNOSIS — K5901 Slow transit constipation: Secondary | ICD-10-CM

## 2015-08-08 NOTE — Progress Notes (Signed)
Subjective:    Patient ID: Martin [name], male    DOB: 01/20/1931, 82 y.o.   MRN: [id number]  HPI  82 year old right-handed male with history of hypertension, COPD, remote tobacco abuse, coronary artery disease presents for transitional care management after being discharged from CIR for right cerebellar infarct. DATE OF ADMISSION:  07/17/2015 DATE OF DISCHARGE:  07/26/2015 Since discharge, he has been feeling well.  He did not have an appetite, but that is improves.  He has not scheduled an appointment with Neurology.  He sees his PCP next week.  He continues to take his BP meds.   DME: None needed Therapies: Daily PT/OT Mobility: No assistive devices  Pain Inventory Average Pain 0 Pain Right Now 0 My pain is NA  In the last 24 hours, has pain interfered with the following? General activity 0 Relation with others 0 Enjoyment of life 0 What TIME of day is your pain at its worst? na Sleep (in general) NA  Pain is worse with: walking Pain improves with: na Relief from Meds: 0  Mobility walk without assistance ability to climb steps?  yes do you drive?  yes  Function retired  Neuro/Psych No problems in this area  Prior Studies Any changes since last visit?  no  Physicians involved in your care Any changes since last visit?  no   Family History  Problem Relation Age of Onset  . Cancer Father     LIVER CANCER   Social History   Social History  . Marital Status: Widowed    Spouse Name: N/A  . Number of Children: N/A  . Years of Education: N/A   Social History Main Topics  . Smoking status: Former Smoker -- 74 years    Types: Cigarettes    Quit date: 07/08/2009  . Smokeless tobacco: Never Used  . Alcohol Use: No  . Drug Use: No  . Sexual Activity: Not Asked   Other Topics Concern  . None   Social History Narrative   Past Surgical History  Procedure Laterality Date  . Aortobifemoral bpg  07/22/10    and Right femoral-popliteal BPG   Past  Medical History  Diagnosis Date  . Hypertension   . Hyperlipidemia   . Myocardial infarction [facility])     1978 & 2011  . COPD (chronic obstructive pulmonary disease) ([place])   . Peripheral vascular disease, unspecified ([place])   . GERD (gastroesophageal reflux disease)   . Arthritis   . H/O: GI bleed   . Hiatal hernia   . Diabetes mellitus   . Bronchitis   . Renal artery stenosis ([facility])    BP 163/84 mmHg  Pulse 72  Resp 14  SpO2 97%  Opioid Risk Score:   Fall Risk Score:  `1  Depression screen PHQ 2/9  Depression screen PHQ 2/9 08/08/2015  Decreased Interest 0  Down, Depressed, Hopeless 0  PHQ - 2 Score 0   Review of Systems  Constitutional: Negative for fever and chills.  Musculoskeletal: Negative for back pain.  Neurological: Negative for speech difficulty, weakness and numbness.  All other systems reviewed and are negative.     Objective:   Physical Exam Constitutional:  He appears well-developed and well-nourished.  NAD. Vital signs reviewed. HENT:  Normocephalic and atraumatic.   Eyes: Conjunctivae and EOM are normal.   Cardiovascular: Normal rate and regular rhythm.    Respiratory: Effort normal. No respiratory distress.   GI: Soft. Bowel sounds are normal. He exhibits no distension.  Musculoskeletal: He exhibits no edema or tenderness.  Neurological: He is alert and oriented.  Speech is clear.   Motor:   Right upper extremity: 5/5 proximal to distal Left upper extremity: 5/5 proximal to distal Right lower extremity: 5/5 proximal to distal Left lower extremity: 5/5 proximal to distal  Right sided: No ataxia or dysmetria.   Skin: Skin is warm and dry.  Psychiatric: He has a normal mood and affect. His behavior is normal. Thought content normal    Assessment & Plan:  82 year old right-handed male with history of hypertension, COPD, remote tobacco abuse, coronary artery disease presents for transitional care management after being discharged from CIR for right  cerebellar infarct.  1. S/p right cerebellar infarct on 07/15/2015.     Cont therapies  Cont meds  Pt to call Neurology for follow up appointment  2. Insomnia  Pt's PCP prescribing xanax, would encourage d/c xanax and transition to Trazodone given age   14. Hypertension:   Elevated, SBP 163 this visit  Follow up with PCP regarding adjustments  4. CAD with remote:  Cont meds  Follow up with PCP  5. CKD:  Improved in hospital  Follow up with PCP for lab work  6. Constipation: Resolved  7. Nausea:  Resolved   Meds reviewed Referrals reviewed All questions answered

## 2015-08-09 ENCOUNTER — Other Ambulatory Visit: Payer: Self-pay | Admitting: Family Medicine

## 2015-08-09 DIAGNOSIS — E1151 Type 2 diabetes mellitus with diabetic peripheral angiopathy without gangrene: Secondary | ICD-10-CM | POA: Diagnosis not present

## 2015-08-09 DIAGNOSIS — I10 Essential (primary) hypertension: Secondary | ICD-10-CM | POA: Diagnosis not present

## 2015-08-09 DIAGNOSIS — I2510 Atherosclerotic heart disease of native coronary artery without angina pectoris: Secondary | ICD-10-CM | POA: Diagnosis not present

## 2015-08-09 DIAGNOSIS — J449 Chronic obstructive pulmonary disease, unspecified: Secondary | ICD-10-CM | POA: Diagnosis not present

## 2015-08-09 DIAGNOSIS — I69351 Hemiplegia and hemiparesis following cerebral infarction affecting right dominant side: Secondary | ICD-10-CM | POA: Diagnosis not present

## 2015-08-09 DIAGNOSIS — F419 Anxiety disorder, unspecified: Secondary | ICD-10-CM | POA: Diagnosis not present

## 2015-08-09 MED ORDER — METOPROLOL SUCCINATE ER 25 MG PO TB24: 25.0000 mg | ORAL_TABLET | Freq: Every day | ORAL | Status: DC

## 2015-08-28 DIAGNOSIS — F419 Anxiety disorder, unspecified: Secondary | ICD-10-CM | POA: Diagnosis not present

## 2015-08-28 DIAGNOSIS — I2510 Atherosclerotic heart disease of native coronary artery without angina pectoris: Secondary | ICD-10-CM | POA: Diagnosis not present

## 2015-08-28 DIAGNOSIS — I10 Essential (primary) hypertension: Secondary | ICD-10-CM | POA: Diagnosis not present

## 2015-08-28 DIAGNOSIS — E1151 Type 2 diabetes mellitus with diabetic peripheral angiopathy without gangrene: Secondary | ICD-10-CM | POA: Diagnosis not present

## 2015-08-28 DIAGNOSIS — J449 Chronic obstructive pulmonary disease, unspecified: Secondary | ICD-10-CM | POA: Diagnosis not present

## 2015-08-28 DIAGNOSIS — I69351 Hemiplegia and hemiparesis following cerebral infarction affecting right dominant side: Secondary | ICD-10-CM | POA: Diagnosis not present

## 2015-09-03 ENCOUNTER — Telehealth: Payer: Self-pay | Admitting: Family Medicine

## 2015-09-03 MED ORDER — ATORVASTATIN CALCIUM 20 MG PO TABS: 20.0000 mg | ORAL_TABLET | Freq: Every day | ORAL | Status: DC

## 2015-09-03 NOTE — Telephone Encounter (Signed)
Medication called/sent to requested pharmacy  

## 2015-09-03 NOTE — Telephone Encounter (Signed)
Patient calling requesting a refill on his atorvastatin (LIPITOR) 20 MG tablet he states he was put on this medication while he was in the hospital. He has been without medication for a week now. He uses Paediatric nurse on [address]. I have made him a hfu from April he states he was in [place] for 12 days due to a stroke  CB# [phone]

## 2015-09-19 ENCOUNTER — Other Ambulatory Visit: Payer: Self-pay | Admitting: Family Medicine

## 2015-09-20 DIAGNOSIS — J449 Chronic obstructive pulmonary disease, unspecified: Secondary | ICD-10-CM | POA: Diagnosis not present

## 2015-09-20 DIAGNOSIS — I69351 Hemiplegia and hemiparesis following cerebral infarction affecting right dominant side: Secondary | ICD-10-CM | POA: Diagnosis not present

## 2015-09-20 DIAGNOSIS — E1151 Type 2 diabetes mellitus with diabetic peripheral angiopathy without gangrene: Secondary | ICD-10-CM | POA: Diagnosis not present

## 2015-09-20 DIAGNOSIS — F419 Anxiety disorder, unspecified: Secondary | ICD-10-CM | POA: Diagnosis not present

## 2015-09-20 DIAGNOSIS — I10 Essential (primary) hypertension: Secondary | ICD-10-CM | POA: Diagnosis not present

## 2015-09-20 DIAGNOSIS — I2510 Atherosclerotic heart disease of native coronary artery without angina pectoris: Secondary | ICD-10-CM | POA: Diagnosis not present

## 2015-09-20 DIAGNOSIS — G5601 Carpal tunnel syndrome, right upper limb: Secondary | ICD-10-CM | POA: Diagnosis not present

## 2015-09-27 ENCOUNTER — Encounter: Payer: Self-pay | Admitting: Family Medicine

## 2015-09-27 ENCOUNTER — Ambulatory Visit (INDEPENDENT_AMBULATORY_CARE_PROVIDER_SITE_OTHER): Payer: Medicare Other | Admitting: Family Medicine

## 2015-09-27 VITALS — BP 150/80 | HR 78 | Temp 97.8°F | Resp 18 | Ht 66.0 in | Wt 142.0 lb

## 2015-09-27 DIAGNOSIS — Z09 Encounter for follow-up examination after completed treatment for conditions other than malignant neoplasm: Secondary | ICD-10-CM | POA: Diagnosis not present

## 2015-09-27 DIAGNOSIS — I639 Cerebral infarction, unspecified: Secondary | ICD-10-CM | POA: Diagnosis not present

## 2015-09-27 NOTE — Progress Notes (Signed)
Subjective:    Patient ID: Martin [name], male    DOB: December 28, 1930, 82 y.o.   MRN: [id number]  HPI  Patient was admitted to the hospital in May for right cerebellar stroke. I have copied relevant portions of the discharge summary and included them below for my reference:  DATE OF ADMISSION: 07/17/2015 DATE OF DISCHARGE: 07/26/2015   DISCHARGE SUMMARY   DISCHARGE DIAGNOSES: 1. Right cerebellar infarction. 2. SCDs for deep vein thrombosis prophylaxis. 3. Anxiety. 4. Hypertension. 5. Coronary artery disease with remote myocardial infarction. 6. Chronic obstructive pulmonary disease. 7. Chronic renal insufficiency. 8. Constipation, resolved.  HISTORY OF PRESENT ILLNESS: This is an 82 year old right-handed male with history of hypertension, COPD, remote tobacco abuse, coronary artery disease. Independent prior to admission living alone. He has a brother who lives across the street. Admitted Jul 15, 2015, with double vision and gait disturbance. MRI of the brain showed moderately large acute mid to inferior right cerebellar infarct. Remote right thalamic and left basal ganglia small infarcts. MRA of the head, right vertebral artery and right posterior-inferior cerebellar artery occluded. The patient did not receive tPA. Carotid Dopplers with left 40-59% ICA stenosis. Echocardiogram with ejection fraction of 18% grade 1 diastolic dysfunction. Neurology consulted, maintained on aspirin and Plavix therapy x3 months then Plavix alone. Repeat CT scan showed focal mass effect but no hydrocephalus. Tolerating a regular diet. The patient was admitted for a comprehensive rehab program.  PAST MEDICAL HISTORY: See discharge diagnoses.  SOCIAL HISTORY: Lives alone. Independent prior to admission.  FUNCTIONAL STATUS UPON ADMISSION TO REHAB SERVICES: Moderate assist 100 feet one-person handheld assistance, minimal guard sit to stand, min to mod  assist activities of daily living.  PHYSICAL EXAMINATION: VITAL SIGNS: Blood pressure 150/69, pulse 71, temperature 97, respirations 20. GENERAL: This was an alert male, in no acute distress. Oriented to person, place, and time. LUNGS: Clear to auscultation without wheeze. CARDIAC: Regular rate and rhythm. No murmur. ABDOMEN: Soft, nontender. Good bowel sounds.  REHABILITATION HOSPITAL COURSE: The patient was admitted to Inpatient Rehab Services with therapies initiated on a 3-hour daily basis, consisting of physical therapy, occupational therapy, and rehabilitation nursing. The following issues were addressed during the patient's rehabilitation stay. Pertaining to [name] right cerebellar infarct remained stable, maintained on aspirin and Plavix therapy. He would follow up Neurology Services. SCDs for DVT prophylaxis. No signs of DVT. He was using Xanax on a limited basis for anxiety with good results. Blood pressures controlled on Toprol. He would follow up with his primary MD. History of coronary artery disease, remote myocardial infarction. Continue Plavix and aspirin therapy. No chest pain or shortness of breath. He did have a history of COPD with remote tobacco abuse. He was using Dulera twice daily. Chronic renal insufficiency, latest creatinine 1.61, encouragement of fluids. Bouts of constipation resolved with laxative assistance. The patient received weekly collaborative interdisciplinary team conferences to discuss estimated length of stay, family teaching, any barriers to discharge. The patient ambulating with a small base quad cane distance supervision. Instructed the patient on stair negotiation of 12 steps with a right hand rail and supervision. High-level gait training through obstacle course, small base quad cane, stepping over and weaving around objects with supervision. He could gather his belongings for activities of daily living and  homemaking. Worked on standing balance using a foam surface in the gym while completing ball toss. It was discussed no driving. Full teaching completed and plan discharge to home with ongoing therapies dictated per Rehab  Services.  DISCHARGE MEDICATIONS: 1. Xanax 0.5 mg p.o. at bedtime as needed. 2. Aspirin 325 mg p.o. daily. 3. Lipitor 20 mg p.o. daily. 4. Plavix 75 mg p.o. daily. 5. Pepcid 20 mg p.o. b.i.d. 6. Toprol-XL 25 mg p.o. daily. 7. Dulera 2 puffs twice daily. 8. MiraLAX daily, hold for loose stools.  DIET: Regular.  FOLLOWUP: The patient would follow up with Dr. Delice Lesch, Rehab Service's office to call for appointment; Dr. Erlinda Hong, Neurology Service in 1 month; Dr. Jenna Luo, Medical Management.  09/27/15 I have not seen the patient since his discharge from the hospital.  Plan of neurology in the hospital was to continue aspirin and Plavix through the end of August and then discontinue aspirin to maintain on Plavix alone. Patient is still on dual antiplatelet therapy. He looks amazing today.  Given the severity of the stroke that he suffered I am very surprised to see him walking without assistance. He requires neither a cane or walker. He is able to drive. He does state that he occasionally loses his balance slightly and overall is doing remarkably well. His memory does not seem to be affected. He shows no evidence of delirium or confusion. His blood pressure today is elevated. I confirmed this number myself. Patient states that he has white coat syndrome. He is checking his blood pressure everyday at home and states that it typically is in the 096-045 range systolic. He denies any shortness of breath or chest pain. He discontinued pravastatin after the stroke and switch to Lipitor which was their recommendation. He is currently on Lipitor 20 mg a day. He denies any myalgias or right upper quadrant pain Past Medical History  Diagnosis Date  . Hypertension   .  Hyperlipidemia   . Myocardial infarction [facility])     1978 & 2011  . COPD (chronic obstructive pulmonary disease) ([place])   . Peripheral vascular disease, unspecified ([place])   . GERD (gastroesophageal reflux disease)   . Arthritis   . H/O: GI bleed   . Hiatal hernia   . Diabetes mellitus   . Bronchitis   . Renal artery stenosis [facility])    Past Surgical History  Procedure Laterality Date  . Aortobifemoral bpg  07/22/10    and Right femoral-popliteal BPG   Current Outpatient Prescriptions on File Prior to Visit  Medication Sig Dispense Refill  . ALPRAZolam (XANAX) 0.5 MG tablet TAKE ONE TABLET BY MOUTH AT BEDTIME AS NEEDED FOR ANXIETY OR INSOMNIA. 30 tablet 2  . aspirin EC 325 MG EC tablet Take 1 tablet (325 mg total) by mouth daily. 90 tablet 0  . atorvastatin (LIPITOR) 20 MG tablet TAKE ONE TABLET BY MOUTH ONCE DAILY AT 6PM 30 tablet 3  . clopidogrel (PLAVIX) 75 MG tablet Take 1 tablet (75 mg total) by mouth daily. 90 tablet 0  . metoprolol succinate (TOPROL-XL) 25 MG 24 hr tablet Take 1 tablet (25 mg total) by mouth daily. 30 tablet 3  . NITROSTAT 0.4 MG SL tablet DISSOLVE ONE TABLET UNDER THE TONGUE AS DIRECTED 25 tablet 0   No current facility-administered medications on file prior to visit.   Allergies  Allergen Reactions  . Penicillins Rash   Social History   Social History  . Marital Status: Widowed    Spouse Name: N/A  . Number of Children: N/A  . Years of Education: N/A   Occupational History  . Not on file.   Social History Main Topics  . Smoking status: Former Smoker -- 79  years    Types: Cigarettes    Quit date: 07/08/2009  . Smokeless tobacco: Never Used  . Alcohol Use: No  . Drug Use: No  . Sexual Activity: Not on file   Other Topics Concern  . Not on file   Social History Narrative     Review of Systems  All other systems reviewed and are negative.      Objective:   Physical Exam  Constitutional: He is oriented to person, place, and time. He  appears well-developed and well-nourished. No distress.  Neck: Neck supple. No JVD present.  Cardiovascular: Normal rate, regular rhythm and normal heart sounds.   Pulmonary/Chest: Effort normal and breath sounds normal. No respiratory distress. He has no wheezes. He has no rales.  Abdominal: Soft. Bowel sounds are normal.  Musculoskeletal: He exhibits no edema.  Lymphadenopathy:    He has no cervical adenopathy.  Neurological: He is alert and oriented to person, place, and time. He displays normal reflexes. No cranial nerve deficit. He exhibits normal muscle tone.  Skin: He is not diaphoretic.  Psychiatric: He has a normal mood and affect. His behavior is normal. Judgment and thought content normal.  Vitals reviewed.         Assessment & Plan:  Hospital discharge follow-up - Plan: CBC with Differential/Platelet, COMPLETE METABOLIC PANEL WITH GFR, Hemoglobin A1c  Patient is doing remarkably well after his stroke. Given the fact he is on dual antiplatelet therapy, will check a CBC to monitor for anemia. I recommended that he discontinue aspirin in August and remain only on Plavix 75 mg a day. His blood pressure is elevated today. The patient is convinced that is white coat syndrome. He will check his blood pressure daily at home and notify me of the values in one week. If consistently greater than 140/90, I would recommend starting the patient on angiotensin receptor blocker. I will check a hemoglobin A1c while the patient's ear. He has a history of diabetes mellitus. He is not fasting and therefore cannot check a fasting lipid panel but I recommended repeating a fasting lipid panel in 3 months on the Lipitor

## 2015-09-28 LAB — CBC WITH DIFFERENTIAL/PLATELET
Basophils Absolute: 0 cells/uL (ref 0–200)
Basophils Relative: 0 %
EOS ABS: 140 {cells}/uL (ref 15–500)
Eosinophils Relative: 2 %
HEMATOCRIT: 45.5 % (ref 38.5–50.0)
HEMOGLOBIN: 15.2 g/dL (ref 13.0–17.0)
LYMPHS ABS: 1610 {cells}/uL (ref 850–3900)
Lymphocytes Relative: 23 %
MCH: 31.9 pg (ref 27.0–33.0)
MCHC: 33.4 g/dL (ref 32.0–36.0)
MCV: 95.4 fL (ref 80.0–100.0)
MONO ABS: 560 {cells}/uL (ref 200–950)
MPV: 11.3 fL (ref 7.5–12.5)
Monocytes Relative: 8 %
NEUTROS PCT: 67 %
Neutro Abs: 4690 cells/uL (ref 1500–7800)
Platelets: 176 10*3/uL (ref 140–400)
RBC: 4.77 MIL/uL (ref 4.20–5.80)
RDW: 13.3 % (ref 11.0–15.0)
WBC: 7 10*3/uL (ref 3.8–10.8)

## 2015-09-28 LAB — COMPLETE METABOLIC PANEL WITH GFR
ALBUMIN: 4 g/dL (ref 3.6–5.1)
ALK PHOS: 69 U/L (ref 40–115)
ALT: 15 U/L (ref 9–46)
AST: 21 U/L (ref 10–35)
BUN: 27 mg/dL — AB (ref 7–25)
CO2: 22 mmol/L (ref 20–31)
Calcium: 9 mg/dL (ref 8.6–10.3)
Chloride: 103 mmol/L (ref 98–110)
Creat: 1.47 mg/dL — ABNORMAL HIGH (ref 0.70–1.11)
GFR, EST NON AFRICAN AMERICAN: 43 mL/min — AB (ref >=60)
GFR, Est African American: 50 mL/min — ABNORMAL LOW (ref >=60)
GLUCOSE: 102 mg/dL — AB (ref 70–99)
POTASSIUM: 4.1 mmol/L (ref 3.5–5.3)
Sodium: 139 mmol/L (ref 135–146)
Total Bilirubin: 0.9 mg/dL (ref 0.2–1.2)
Total Protein: 6.6 g/dL (ref 6.1–8.1)

## 2015-09-28 LAB — HEMOGLOBIN A1C
Hgb A1c MFr Bld: 6.2 % — ABNORMAL HIGH (ref <5.7)
Mean Plasma Glucose: 131 mg/dL

## 2015-10-05 ENCOUNTER — Encounter: Payer: Medicare Other | Attending: Physical Medicine & Rehabilitation | Admitting: Physical Medicine & Rehabilitation

## 2015-10-05 ENCOUNTER — Encounter: Payer: Self-pay | Admitting: Physical Medicine & Rehabilitation

## 2015-10-05 VITALS — BP 173/69 | HR 75 | Temp 97.7°F

## 2015-10-05 DIAGNOSIS — J449 Chronic obstructive pulmonary disease, unspecified: Secondary | ICD-10-CM | POA: Insufficient documentation

## 2015-10-05 DIAGNOSIS — I6930 Unspecified sequelae of cerebral infarction: Secondary | ICD-10-CM | POA: Diagnosis not present

## 2015-10-05 DIAGNOSIS — I2510 Atherosclerotic heart disease of native coronary artery without angina pectoris: Secondary | ICD-10-CM | POA: Insufficient documentation

## 2015-10-05 DIAGNOSIS — R110 Nausea: Secondary | ICD-10-CM | POA: Insufficient documentation

## 2015-10-05 DIAGNOSIS — E785 Hyperlipidemia, unspecified: Secondary | ICD-10-CM | POA: Insufficient documentation

## 2015-10-05 DIAGNOSIS — Z79899 Other long term (current) drug therapy: Secondary | ICD-10-CM | POA: Diagnosis not present

## 2015-10-05 DIAGNOSIS — N189 Chronic kidney disease, unspecified: Secondary | ICD-10-CM | POA: Diagnosis not present

## 2015-10-05 DIAGNOSIS — I6990 Unspecified sequelae of unspecified cerebrovascular disease: Secondary | ICD-10-CM | POA: Insufficient documentation

## 2015-10-05 DIAGNOSIS — M1990 Unspecified osteoarthritis, unspecified site: Secondary | ICD-10-CM | POA: Insufficient documentation

## 2015-10-05 DIAGNOSIS — I639 Cerebral infarction, unspecified: Secondary | ICD-10-CM | POA: Diagnosis not present

## 2015-10-05 DIAGNOSIS — E1122 Type 2 diabetes mellitus with diabetic chronic kidney disease: Secondary | ICD-10-CM | POA: Insufficient documentation

## 2015-10-05 DIAGNOSIS — I63211 Cerebral infarction due to unspecified occlusion or stenosis of right vertebral arteries: Secondary | ICD-10-CM | POA: Diagnosis not present

## 2015-10-05 DIAGNOSIS — I10 Essential (primary) hypertension: Secondary | ICD-10-CM

## 2015-10-05 DIAGNOSIS — K219 Gastro-esophageal reflux disease without esophagitis: Secondary | ICD-10-CM | POA: Insufficient documentation

## 2015-10-05 DIAGNOSIS — I129 Hypertensive chronic kidney disease with stage 1 through stage 4 chronic kidney disease, or unspecified chronic kidney disease: Secondary | ICD-10-CM | POA: Diagnosis not present

## 2015-10-05 DIAGNOSIS — I739 Peripheral vascular disease, unspecified: Secondary | ICD-10-CM | POA: Insufficient documentation

## 2015-10-05 DIAGNOSIS — I252 Old myocardial infarction: Secondary | ICD-10-CM | POA: Insufficient documentation

## 2015-10-05 DIAGNOSIS — K449 Diaphragmatic hernia without obstruction or gangrene: Secondary | ICD-10-CM | POA: Insufficient documentation

## 2015-10-05 DIAGNOSIS — G4700 Insomnia, unspecified: Secondary | ICD-10-CM | POA: Insufficient documentation

## 2015-10-05 DIAGNOSIS — Z87891 Personal history of nicotine dependence: Secondary | ICD-10-CM | POA: Insufficient documentation

## 2015-10-05 DIAGNOSIS — I701 Atherosclerosis of renal artery: Secondary | ICD-10-CM | POA: Diagnosis not present

## 2015-10-05 NOTE — Progress Notes (Addendum)
Subjective:    Patient ID: Martin [name], male    DOB: 03/18/1930, 82 y.o.   MRN: [id number]  Cerebrovascular Accident  Pertinent negatives include no chills, fever, numbness or weakness.  82 year old right-handed male with history of hypertension, COPD, remote tobacco abuse, coronary artery disease presents for follow up for right cerebellar infarct.  Last clinic visit 08/08/15.  He still has not seen Neurology yet.  His sleep has improved.  He saw his PCP last week.  He denies falls.  Since last visit.  He has been doing well, improving his ambulation.   Pain Inventory Average Pain 0 Pain Right Now 0 My pain is NA  In the last 24 hours, has pain interfered with the following? General activity 0 Relation with others 0 Enjoyment of life 0 What TIME of day is your pain at its worst? na Sleep (in general) NA  Pain is worse with: walking Pain improves with: na Relief from Meds: 0  Mobility walk without assistance ability to climb steps?  yes do you drive?  yes  Function retired  Neuro/Psych No problems in this area  Prior Studies Any changes since last visit?  no  Physicians involved in your care Any changes since last visit?  no   Family History  Problem Relation Age of Onset  . Cancer Father     LIVER CANCER   Social History   Social History  . Marital status: Widowed    Spouse name: N/A  . Number of children: N/A  . Years of education: N/A   Social History Main Topics  . Smoking status: Former Smoker    Years: 65.00    Types: Cigarettes    Quit date: 07/08/2009  . Smokeless tobacco: Never Used  . Alcohol use No  . Drug use: No  . Sexual activity: Not Asked   Other Topics Concern  . None   Social History Narrative  . None   Past Surgical History:  Procedure Laterality Date  . Aortobifemoral BPG  07/22/10   and Right femoral-popliteal BPG   Past Medical History:  Diagnosis Date  . Arthritis   . Bronchitis   . COPD (chronic obstructive  pulmonary disease) ([place])   . Diabetes mellitus   . GERD (gastroesophageal reflux disease)   . H/O: GI bleed   . Hiatal hernia   . Hyperlipidemia   . Hypertension   . Myocardial infarction [facility])    1978 & 2011  . Peripheral vascular disease, unspecified ([place])   . Renal artery stenosis ([facility])    BP (!) 173/69 (BP Location: Left Arm, Patient Position: Sitting, Cuff Size: Normal)   Pulse 75   Temp 97.7 F (36.5 C) (Oral)   SpO2 93%   Opioid Risk Score:   Fall Risk Score:  `1  Depression screen PHQ 2/9  Depression screen PHQ 2/9 08/08/2015  Decreased Interest 0  Down, Depressed, Hopeless 0  PHQ - 2 Score 0   Review of Systems  Constitutional: Negative for chills and fever.  Musculoskeletal: Positive for gait problem. Negative for back pain.  Neurological: Negative for speech difficulty, weakness and numbness.  All other systems reviewed and are negative.     Objective:   Physical Exam Constitutional:  He appears well-developed and well-nourished.  NAD. Vital signs reviewed. HENT:  Normocephalic and atraumatic.   Eyes: Conjunctivae and EOM are normal.   Cardiovascular: Normal rate and regular rhythm.    Respiratory: Effort normal. No respiratory distress.   GI: Soft.  Bowel sounds are normal. He exhibits no distension.  Musculoskeletal: He exhibits no edema or tenderness.  Neurological: He is alert and oriented.  Speech is clear.   Motor:   Right upper extremity: 5/5 proximal to distal Left upper extremity: 5/5 proximal to distal Right lower extremity: 5/5 proximal to distal Left lower extremity: 5/5 proximal to distal  Right sided: No ataxia or dysmetria.   Skin: Skin is warm and dry.  Psychiatric: He has a normal mood and affect. His behavior is normal. Thought content normal    Assessment & Plan:  82 year old right-handed male with history of hypertension, COPD, remote tobacco abuse, coronary artery disease presents for follow up for right cerebellar infarct.  1.  S/p right cerebellar infarct on 07/15/2015.     Completed therapies  Cont meds  Will refer pt to Neurology for follow up appointment - Dr. Erlinda Hong, as he never followed up  2. Insomnia  Pt's PCP prescribing xanax, again encouraged d/c xanax and transition to Trazodone given age  82. Hypertension:   Elevated, SBP this visit, however, pt recently saw his PCP and states that his BPs run WNL at home and only elevated during office visits

## 2015-10-08 ENCOUNTER — Other Ambulatory Visit: Payer: Self-pay | Admitting: Family Medicine

## 2015-10-08 NOTE — Telephone Encounter (Signed)
ok 

## 2015-10-08 NOTE — Telephone Encounter (Signed)
Ok to refill 

## 2015-10-31 ENCOUNTER — Telehealth: Payer: Self-pay | Admitting: Family Medicine

## 2015-10-31 ENCOUNTER — Other Ambulatory Visit: Payer: Self-pay | Admitting: Family Medicine

## 2015-10-31 MED ORDER — CLOPIDOGREL BISULFATE 75 MG PO TABS: 75.0000 mg | ORAL_TABLET | Freq: Every day | ORAL | 1 refills | Status: DC

## 2015-10-31 NOTE — Telephone Encounter (Signed)
Medication refilled per protocol. 

## 2015-11-01 ENCOUNTER — Encounter: Payer: Self-pay | Admitting: Neurology

## 2015-11-01 ENCOUNTER — Ambulatory Visit (INDEPENDENT_AMBULATORY_CARE_PROVIDER_SITE_OTHER): Payer: Medicare Other | Admitting: Neurology

## 2015-11-01 VITALS — BP 137/68 | HR 65 | Ht 66.0 in | Wt 147.8 lb

## 2015-11-01 DIAGNOSIS — I2510 Atherosclerotic heart disease of native coronary artery without angina pectoris: Secondary | ICD-10-CM

## 2015-11-01 DIAGNOSIS — E785 Hyperlipidemia, unspecified: Secondary | ICD-10-CM

## 2015-11-01 DIAGNOSIS — I639 Cerebral infarction, unspecified: Secondary | ICD-10-CM

## 2015-11-01 DIAGNOSIS — I10 Essential (primary) hypertension: Secondary | ICD-10-CM

## 2015-11-01 NOTE — Progress Notes (Signed)
STROKE NEUROLOGY FOLLOW UP NOTE  NAME: Martin [name] DOB: Oct 30, 1930  REASON FOR VISIT: stroke follow up HISTORY FROM: pt and chart  Today we had the pleasure of seeing AVIEL DAVALOS in follow-up at our Neurology Clinic. Pt was accompanied by no one.   History Summary Mr. TAESEAN RETH is a 82 y.o. male with history of  hypertension, hyperlipidemia, myocardial infarction admitted on 07/15/2015 for sudden onset dizziness and ataxia. MRI showed right PICA infarct with tiny amount of hemorrhagic transformation. Old right thalamic BG infarcts. MRA head showed right VA and right eye concluded as well as extensive posterior circulation atherosclerotic disease. MRA neck showed right VA occluded the C2 level. CUS left ICA 40-59% stenosis. Of note, he has been following with Dr. Oneida Alar for carotid stenosis and CUS in 12/2013 showed bilateral 40-59% ICA stenosis. TTE 55-60% EF. LDL 101 and A1c 6.0. Repeat CT scan showed no hydrocephalus. He was discharged to CIR with DAPT and pravastatin changed to lipitor.   Interval History During the interval time, the patient has been doing well. He stayed in CIR for 7 days and discharged. Currently, he denies any dizziness or ataxia, able to walk but not completely back to baseline. He follows with Dr. Posey Pronto in rehabilitation and has been discharged from his service. He is following with PCP regularly. BP today 137/68.  REVIEW OF SYSTEMS: Full 14 system review of systems performed and notable only for those listed below and in HPI above, all others are negative:  Constitutional:   Cardiovascular:  Ear/Nose/Throat:   Skin:  Eyes:   Respiratory:  cough Gastroitestinal:   Genitourinary:  Hematology/Lymphatic:  Easy bruising Endocrine:  Musculoskeletal:   Allergy/Immunology:   Neurological:   Psychiatric:  Sleep:   The following represents the patient's updated allergies and side effects list: Allergies  Allergen Reactions  . Penicillins Rash     The neurologically relevant items on the patient's problem list were reviewed on today's visit.  Neurologic Examination  A problem focused neurological exam (12 or more points of the single system neurologic examination, vital signs counts as 1 point, cranial nerves count for 8 points) was performed.  Blood pressure 137/68, pulse 65, height '5\' 6"'$  (1.676 m), weight 147 lb 12.8 oz (67 kg).  General - Well nourished, well developed, in no apparent distress.  Ophthalmologic - Sharp disc margins OU.  Cardiovascular - Regular rate and rhythm with no murmur.  Mental Status -  Level of arousal and orientation to time, place, and person were intact. Language including expression, naming, repetition, comprehension was assessed and found intact. Fund of Knowledge was assessed and was intact.  Cranial Nerves II - XII - II - Visual field intact OU. III, IV, VI - Extraocular movements intact. V - Facial sensation intact bilaterally. VII - Facial movement intact bilaterally. VIII - Hearing & vestibular intact bilaterally. X - Palate elevates symmetrically. XI - Chin turning & shoulder shrug intact bilaterally. XII - Tongue protrusion intact  Motor Strength - The patient's strength was normal in all extremities and pronator drift was absent.  Bulk was normal and fasciculations were absent.   Motor Tone - Muscle tone was assessed at the neck and appendages and was normal.  Reflexes - The patient's reflexes were 1+ in all extremities and he had no pathological reflexes.  Sensory - Light touch, temperature/pinprick were assessed and were normal.    Coordination - The patient had normal movements in the hands and feet with no ataxia  or dysmetria.  Tremor was absent.  Gait and Station - The patient's transfers, posture, gait, station, and turns were observed as norma.   Functional score  mRS = 0      0 - No symptoms.   1 - No significant disability. Able to carry out all usual  activities, despite some symptoms.   2 - Slight disability. Able to look after own affairs without assistance, but unable to carry out all previous activities.   3 - Moderate disability. Requires some help, but able to walk unassisted.   4 - Moderately severe disability. Unable to attend to own bodily needs without assistance, and unable to walk unassisted.   5 - Severe disability. Requires constant nursing care and attention, bedridden, incontinent.   6 - Dead.   NIH Stroke Scale = 0   Data reviewed: I personally reviewed the images and agree with the radiology interpretations.  Ct Head Wo Contrast 07/16/2015  1. Evolving acute ischemic right cerebellar infarct, stable in size and distribution relative to prior MRI. No CT evidence for hemorrhagic transformation. Mild localized edema with mass effect on the fourth ventricular outflow tract. Fourth ventricle itself remains patent. No hydrocephalus at this time. 2. No other new acute intracranial process.   Mr Angiogram Neck W Wo Contrast 07/15/2015   Exam is limited. There appears to be flow within portions of the vertebral arteries bilaterally with right vertebral artery appearing to be occluded at the C2 level. Suggestion of mild to slightly moderate narrowing proximal left vertebral artery. Bilateral carotid bifurcation narrowing suspected and more notable on the right but not able to adequately grade stenosis on the current exam.   MRI HEAD  07/15/2015  Moderately large acute mid to inferior right cerebellar infarct. There may be a tiny amount hemorrhage along the periphery. Remote right thalamic and left basal ganglia small infarcts. Mild chronic microvascular changes. Mild global atrophy without hydrocephalus. Prominent transverse ligament hypertrophy. Mild contact with the ventral aspect of the cervical medullary junction.   MRA HEAD  07/15/2015  Right vertebral artery and right posterior inferior cerebellar artery are occluded. Mild  narrowing distal left vertebral artery proximal to the takeoff of the left posterior inferior cerebellar artery. Mild to moderate narrowing portions of the left posterior inferior cerebellar artery. Ectatic basilar artery with slight irregularity without high-grade stenosis. Nonvisualized left anterior inferior cerebellar artery with small caliber right anterior inferior cerebellar artery. Mild irregularity proximal right posterior cerebral artery. Moderate narrowing mid and distal aspect left posterior cerebral artery. Distal right posterior cerebral artery branch vessel narrowing. Moderate to marked narrowing right internal carotid artery cavernous segment with regions of adjacent ectasia. Moderate narrowing right internal carotid artery supraclinoid segment. Ectatic irregular left internal carotid artery cavernous segment. 2 mm bulge posterior aspect left internal carotid artery pre cavernous segment may represent tiny aneurysm or result of atherosclerotic changes. Mild narrowing left internal carotid artery supraclinoid segment Moderate narrowing M1 segment left middle cerebral artery. Mild irregularity M1 segment right middle cerebral artery without high-grade stenosis. Middle cerebral artery branches with areas of ectasia and moderate narrowing. Ectatic anterior cerebral arteries with moderate to marked narrowing portions of the A2 segment greater on the left.   TTE - Left ventricle: The cavity size was normal. Wall thickness was increased in a pattern of moderate LVH. Systolic function was normal. The estimated ejection fraction was in the range of 55% to 60%. Images were inadequate for LV wall motion assessment. Doppler parameters are consistent with abnormal left  ventricular relaxation (grade 1 diastolic dysfunction). The E/e&' ratio is >15, suggesting elevated LV filling pressure. - Mitral valve: Mildly thickened leaflets . There was trivial regurgitation. - Left atrium: The atrium was normal in  size. - Inferior vena cava: The vessel was normal in size. The respirophasic diameter changes were in the normal range (>= 50%), consistent with normal central venous pressure. Impressions:   LVEF 55-60%, moderate LVH, diastolic dysfunction, elevated LV filling pressure, normal LA size, trivial MR, normal IVC  CUS Right: 1-39% ICA stenosis. Left: 40-59% ICA stenosis. Vertebral artery flow is antegrade.  Component     Latest Ref Rng & Units 07/16/2015 09/27/2015  Cholesterol     0 - 200 mg/dL 177   Triglycerides     <150 mg/dL 152 (H)   HDL Cholesterol     >40 mg/dL 46   Total CHOL/HDL Ratio     RATIO 3.8   VLDL     0 - 40 mg/dL 30   LDL (calc)     0 - 99 mg/dL 101 (H)   Hemoglobin A1C     <5.7 % 6.0 (H) 6.2 (H)  Mean Plasma Glucose     mg/dL 126 131    Assessment: As you may recall, he is a 82 y.o. Caucasian male with PMH of hypertension, hyperlipidemia, MI admitted on 07/15/2015 for right PICA infarct with tiny amount of hemorrhagic transformation. Old right thalamic BG infarcts. MRA head showed right VA and right eye concluded as well as extensive posterior circulation atherosclerotic disease. MRA neck showed right VA occluded the C2 level. CUS left ICA 40-59% stenosis for which he has been following with Dr. Oneida Alar. TTE EF 55-60%. LDL 101 and A1c 6.0. Repeat CT scan showed no hydrocephalus. He was discharged to CIR with DAPT and pravastatin changed to lipitor. He stayed in CIR for 7 days and discharged. He has been doing well. He follows with Dr. Posey Pronto in rehabilitation and has been discharged from his service. He is following with PCP regularly. Will see Dr. Oneida Alar in 01/2016. Finished off DAPT and will change to plavix alone  Plan:  - continue plavix and lipitor for stroke prevention. - discontinue ASA - check BP at home  - home exercise  - avoid fall - Follow up with your primary care physician for stroke risk factor modification. Recommend maintain blood pressure goal <130/80,  diabetes with hemoglobin A1c goal below 7.0% and lipids with LDL cholesterol goal below 70 mg/dL.  - follow up in 6 months.   I spent more than 25 minutes of face to face time with the patient. Greater than 50% of time was spent in counseling and coordination of care. We discussed about antiplatelet regimen, BP monitoring and home exercise.   No orders of the defined types were placed in this encounter.   Meds ordered this encounter  Medications  . famotidine (PEPCID) 20 MG tablet    Patient Instructions  - continue plavix and lipitor for stroke prevention. - discontinue ASA - check BP at home  - home exercise  - avoid fall - Follow up with your primary care physician for stroke risk factor modification. Recommend maintain blood pressure goal <130/80, diabetes with hemoglobin A1c goal below 7.0% and lipids with LDL cholesterol goal below 70 mg/dL.  - follow up in 6 months.    Rosalin Hawking, MD PhD [facility] [address], [place], [place] [address] [phone]

## 2015-11-01 NOTE — Patient Instructions (Addendum)
-   continue plavix and lipitor for stroke prevention. - discontinue ASA - check BP at home  - home exercise  - avoid fall - Follow up with your primary care physician for stroke risk factor modification. Recommend maintain blood pressure goal <130/80, diabetes with hemoglobin A1c goal below 7.0% and lipids with LDL cholesterol goal below 70 mg/dL.  - follow up in 6 months.

## 2015-11-29 ENCOUNTER — Other Ambulatory Visit: Payer: Self-pay | Admitting: Family Medicine

## 2015-11-29 DIAGNOSIS — Z23 Encounter for immunization: Secondary | ICD-10-CM | POA: Diagnosis not present

## 2015-12-01 ENCOUNTER — Telehealth: Payer: Self-pay | Admitting: Family Medicine

## 2015-12-01 NOTE — Telephone Encounter (Signed)
Received a call from ins and they state that his Symbicort is no longer covered however Advair and Breo are covered. OK to switch to one?

## 2015-12-03 NOTE — Telephone Encounter (Signed)
Switch to breo 200/25 onc daily.

## 2015-12-05 MED ORDER — FLUTICASONE FUROATE-VILANTEROL 200-25 MCG/INH IN AEPB: 1.0000 | INHALATION_SPRAY | Freq: Every day | RESPIRATORY_TRACT | 0 refills | Status: DC

## 2015-12-05 NOTE — Telephone Encounter (Signed)
Pt states that he very rarely uses it but if he has a coughing fit he will - will send in to pharm per pt request but if expensive will not get.

## 2015-12-11 ENCOUNTER — Other Ambulatory Visit: Payer: Self-pay | Admitting: Family Medicine

## 2016-01-14 ENCOUNTER — Other Ambulatory Visit: Payer: Self-pay | Admitting: Family Medicine

## 2016-01-14 NOTE — Telephone Encounter (Signed)
Ok to refill 

## 2016-01-14 NOTE — Telephone Encounter (Signed)
ok 

## 2016-01-15 NOTE — Telephone Encounter (Signed)
ok 

## 2016-01-16 ENCOUNTER — Encounter: Payer: Self-pay | Admitting: Vascular Surgery

## 2016-01-17 ENCOUNTER — Ambulatory Visit (INDEPENDENT_AMBULATORY_CARE_PROVIDER_SITE_OTHER)
Admission: RE | Admit: 2016-01-17 | Discharge: 2016-01-17 | Disposition: A | Discharge status: Home / Self Care | Payer: Medicare Other | Source: Ambulatory Visit | Attending: Vascular Surgery | Admitting: Vascular Surgery

## 2016-01-17 ENCOUNTER — Encounter: Payer: Self-pay | Admitting: Vascular Surgery

## 2016-01-17 ENCOUNTER — Ambulatory Visit (INDEPENDENT_AMBULATORY_CARE_PROVIDER_SITE_OTHER): Payer: Medicare Other | Admitting: Vascular Surgery

## 2016-01-17 ENCOUNTER — Ambulatory Visit (HOSPITAL_COMMUNITY)
Admission: RE | Admit: 2016-01-17 | Discharge: 2016-01-17 | Disposition: A | Discharge status: Home / Self Care | Payer: Medicare Other | Source: Ambulatory Visit | Attending: Vascular Surgery | Admitting: Vascular Surgery

## 2016-01-17 VITALS — BP 157/81 | HR 67 | Temp 97.6°F | Resp 20 | Ht 66.0 in | Wt 147.3 lb

## 2016-01-17 DIAGNOSIS — E785 Hyperlipidemia, unspecified: Secondary | ICD-10-CM | POA: Diagnosis not present

## 2016-01-17 DIAGNOSIS — I6523 Occlusion and stenosis of bilateral carotid arteries: Secondary | ICD-10-CM | POA: Diagnosis not present

## 2016-01-17 DIAGNOSIS — I639 Cerebral infarction, unspecified: Secondary | ICD-10-CM

## 2016-01-17 DIAGNOSIS — R0989 Other specified symptoms and signs involving the circulatory and respiratory systems: Secondary | ICD-10-CM | POA: Diagnosis present

## 2016-01-17 DIAGNOSIS — I739 Peripheral vascular disease, unspecified: Secondary | ICD-10-CM

## 2016-01-17 DIAGNOSIS — R938 Abnormal findings on diagnostic imaging of other specified body structures: Secondary | ICD-10-CM | POA: Insufficient documentation

## 2016-01-17 DIAGNOSIS — Z87891 Personal history of nicotine dependence: Secondary | ICD-10-CM | POA: Insufficient documentation

## 2016-01-17 DIAGNOSIS — I10 Essential (primary) hypertension: Secondary | ICD-10-CM | POA: Diagnosis not present

## 2016-01-17 LAB — VAS US CAROTID
LCCAPDIAS: 11 cm/s
LCCAPSYS: 113 cm/s
LEFT ECA DIAS: -9 cm/s
LEFT VERTEBRAL DIAS: -13 cm/s
LICAPSYS: 290 cm/s
Left CCA dist dias: 9 cm/s
Left CCA dist sys: 59 cm/s
Left ICA dist dias: -16 cm/s
Left ICA dist sys: -61 cm/s
Left ICA prox dias: 43 cm/s
RCCADSYS: -68 cm/s
RCCAPDIAS: 9 cm/s
RCCAPSYS: 124 cm/s
RIGHT CCA MID DIAS: 12 cm/s
RIGHT ECA DIAS: -1 cm/s
RIGHT VERTEBRAL DIAS: 2 cm/s

## 2016-01-17 NOTE — Progress Notes (Signed)
Patient is a 82 year old male who returns for followup today. He previously underwent aortobifemoral bypass graft and right femoral popliteal bypass grafting in May of 2012. His right femoropopliteal has been chronically occluded. He has chronic intermittent stable claudication in the right leg. He denies rest pain. He denies nonhealing wounds on the foot. He overall is fairly active and plays golf 3 times a week. He states that currently he can walk 50 yards before experiencing right leg symptoms. This is unchanged from the last 2 years. Chronic medical problems include hypertension hyperlipidemia coronary artery disease COPD all of which are currently stable.             Current Outpatient Prescriptions on File Prior to Visit  Medication Sig Dispense Refill  . ALPRAZolam (XANAX) 0.5 MG tablet TAKE ONE TABLET BY MOUTH AT BEDTIME AS NEEDED FOR ANXIETY OR  INSOMNIA 30 tablet 2  . aspirin 81 MG tablet Take 81 mg by mouth daily.        . budesonide-formoterol (SYMBICORT) 160-4.5 MCG/ACT inhaler INHALE ONE PUFF BY MOUTH TWICE DAILY (Patient not taking: Reported on 01/04/2015) 11 g 6  . CARTIA XT 120 MG 24 hr capsule TAKE ONE CAPSULE BY MOUTH ONCE DAILY 30 capsule 2  . famotidine (PEPCID) 20 MG tablet TAKE ONE TABLET BY MOUTH TWICE DAILY 60 tablet 5  . losartan (COZAAR) 50 MG tablet TAKE ONE TABLET BY MOUTH ONCE DAILY 30 tablet 11  . metoprolol succinate (TOPROL-XL) 25 MG 24 hr tablet TAKE ONE TABLET BY MOUTH ONCE DAILY. 30 tablet 11  . nitroGLYCERIN (NITROSTAT) 0.4 MG SL tablet Place 1 tablet (0.4 mg total) under the tongue as directed. 25 tablet 1  . pravastatin (PRAVACHOL) 80 MG tablet Take 1 tablet (80 mg total) by mouth daily. (Patient not taking: Reported on 01/04/2015) 90 tablet 3    No current facility-administered medications on file prior to visit.          Allergies    Allergen   Reactions    .   Penicillins               Past Surgical History    Procedure   Laterality   Date     .    Aortobifemoral bpg     07/22/10         and Right femoral-popliteal BPG            Past Medical History    Diagnosis   Date    .   Hypertension      .   Hyperlipidemia      .   Myocardial infarction          1978 & 2011    .   COPD (chronic obstructive pulmonary disease)      .   Peripheral vascular disease, unspecified      .   GERD (gastroesophageal reflux disease)      .   Arthritis      .   H/O: GI bleed      .   Hiatal hernia      .   Diabetes mellitus      .   Bronchitis      .   Renal artery stenosis          Review of systems: He has shortness of breath with severe exertion. He denies chest pain.   Physical exam:    Vitals:   01/17/16 1528 01/17/16 1531  BP: (!) 177/76 Marland Kitchen)  157/81  Pulse: 67   Resp: 20   Temp: 97.6 F (36.4 C)   TempSrc: Oral   SpO2: 97%   Weight: 147 lb 4.8 oz (66.8 kg)   Height: '5\' 6"'$  (1.676 m)    Neck: Left-sided carotid bruit 2+ carotid pulses bilaterally Abdomen: Soft nontender nondistended no pulsatile mass well-healed laparotomy incision 2+ femoral pulses bilaterally   Extremities: Absent popliteal and pedal pulses bilaterally feet pink with 3 second capillary refill Right foot dusky toes compared to the left   Data: 0.4 on the right 0.7 on the left  This is essentially unchanged over the last 4 years.   Carotid duplex scan was performed today based on a left-sided carotid bruit. This showed bilateral 40-60% stenosis unchanged from 2012.   Assessment: Right lower extremity claudication with patent aortobifemoral bypass graft. The patient currently does not feel he is limited by his claudication symptoms and prefers conservative management at this point. He is on aspirin and a cholesterol-lowering agent.   Plan: Followup ABIs and office visit in 12 months.  We'll also repeat his carotid duplex at that time.   Ruta Hinds, MD [facility] Office: [phone] Pager: [phone]

## 2016-01-22 NOTE — Addendum Note (Signed)
Addended by: Mena Goes on: 01/22/2016 04:01 PM   Modules accepted: Orders

## 2016-01-29 ENCOUNTER — Other Ambulatory Visit: Payer: Self-pay | Admitting: Family Medicine

## 2016-02-13 DIAGNOSIS — H04521 Eversion of right lacrimal punctum: Secondary | ICD-10-CM | POA: Diagnosis not present

## 2016-02-13 DIAGNOSIS — H02132 Senile ectropion of right lower eyelid: Secondary | ICD-10-CM | POA: Diagnosis not present

## 2016-04-03 ENCOUNTER — Encounter: Payer: Self-pay | Admitting: Physical Medicine & Rehabilitation

## 2016-04-03 ENCOUNTER — Encounter: Payer: Medicare Other | Attending: Physical Medicine & Rehabilitation | Admitting: Physical Medicine & Rehabilitation

## 2016-04-03 VITALS — BP 174/73 | HR 65

## 2016-04-03 DIAGNOSIS — I2510 Atherosclerotic heart disease of native coronary artery without angina pectoris: Secondary | ICD-10-CM | POA: Diagnosis not present

## 2016-04-03 DIAGNOSIS — E785 Hyperlipidemia, unspecified: Secondary | ICD-10-CM | POA: Diagnosis not present

## 2016-04-03 DIAGNOSIS — I6930 Unspecified sequelae of cerebral infarction: Secondary | ICD-10-CM | POA: Diagnosis not present

## 2016-04-03 DIAGNOSIS — Z8673 Personal history of transient ischemic attack (TIA), and cerebral infarction without residual deficits: Secondary | ICD-10-CM | POA: Insufficient documentation

## 2016-04-03 DIAGNOSIS — K219 Gastro-esophageal reflux disease without esophagitis: Secondary | ICD-10-CM | POA: Insufficient documentation

## 2016-04-03 DIAGNOSIS — J449 Chronic obstructive pulmonary disease, unspecified: Secondary | ICD-10-CM | POA: Insufficient documentation

## 2016-04-03 DIAGNOSIS — Z87891 Personal history of nicotine dependence: Secondary | ICD-10-CM | POA: Diagnosis not present

## 2016-04-03 DIAGNOSIS — I10 Essential (primary) hypertension: Secondary | ICD-10-CM | POA: Diagnosis not present

## 2016-04-03 DIAGNOSIS — E1151 Type 2 diabetes mellitus with diabetic peripheral angiopathy without gangrene: Secondary | ICD-10-CM | POA: Diagnosis not present

## 2016-04-03 DIAGNOSIS — Z808 Family history of malignant neoplasm of other organs or systems: Secondary | ICD-10-CM | POA: Diagnosis not present

## 2016-04-03 DIAGNOSIS — I252 Old myocardial infarction: Secondary | ICD-10-CM | POA: Insufficient documentation

## 2016-04-03 DIAGNOSIS — R269 Unspecified abnormalities of gait and mobility: Secondary | ICD-10-CM

## 2016-04-03 DIAGNOSIS — K449 Diaphragmatic hernia without obstruction or gangrene: Secondary | ICD-10-CM | POA: Insufficient documentation

## 2016-04-03 DIAGNOSIS — G4700 Insomnia, unspecified: Secondary | ICD-10-CM | POA: Insufficient documentation

## 2016-04-03 NOTE — Progress Notes (Signed)
Subjective:    Patient ID: Martin [name], male    DOB: 09-14-1930, 83 y.o.   MRN: [id number]  HPI  83 year old right-handed male with history of hypertension, COPD, remote tobacco abuse, coronary artery disease presents for follow up for right cerebellar infarct.  Last clinic visit 10/05/15.  Since that time, he saw Neurology.  His BP remains elevated in office, but he states that it is normal at home.  He states he is doing much better, but continues to have intermittent unsteady gait.  He denies falls.   Pain Inventory Average Pain 0 Pain Right Now 0 My pain is NA  In the last 24 hours, has pain interfered with the following? General activity 0 Relation with others 0 Enjoyment of life 0 What TIME of day is your pain at its worst? na Sleep (in general) NA  Pain is worse with: walking Pain improves with: na Relief from Meds: 0  Mobility walk without assistance ability to climb steps?  yes do you drive?  yes  Function retired  Neuro/Psych No problems in this area  Prior Studies Any changes since last visit?  no  Physicians involved in your care Any changes since last visit?  no   Family History  Problem Relation Age of Onset  . Cancer Father     LIVER CANCER   Social History   Social History  . Marital status: Widowed    Spouse name: N/A  . Number of children: N/A  . Years of education: N/A   Social History Main Topics  . Smoking status: Former Smoker    Years: 65.00    Types: Cigarettes    Quit date: 07/08/2009  . Smokeless tobacco: Never Used  . Alcohol use No  . Drug use: No  . Sexual activity: Not Asked   Other Topics Concern  . None   Social History Narrative  . None   Past Surgical History:  Procedure Laterality Date  . Aortobifemoral BPG  07/22/10   and Right femoral-popliteal BPG   Past Medical History:  Diagnosis Date  . Arthritis   . Bronchitis   . COPD (chronic obstructive pulmonary disease) ([place])   . Diabetes mellitus   .  GERD (gastroesophageal reflux disease)   . H/O: GI bleed   . Hiatal hernia   . Hyperlipidemia   . Hypertension   . Myocardial infarction    1978 & 2011  . Peripheral vascular disease, unspecified   . Renal artery stenosis ([place])   . Stroke ([place])    BP (!) 174/73   Pulse 65   SpO2 96%   Opioid Risk Score:   Fall Risk Score:  `1  Depression screen PHQ 2/9  Depression screen PHQ 2/9 08/08/2015  Decreased Interest 0  Down, Depressed, Hopeless 0  PHQ - 2 Score 0   Review of Systems  Constitutional: Negative.   HENT: Negative.   Eyes: Negative.   Respiratory: Negative.   Cardiovascular: Negative.   Gastrointestinal: Negative.   Endocrine: Negative.   Genitourinary: Negative.   Musculoskeletal: Positive for gait problem. Negative for back pain.  Skin: Negative.   Allergic/Immunologic: Negative.   Hematological: Negative.   Psychiatric/Behavioral: Negative.   All other systems reviewed and are negative.     Objective:   Physical Exam Constitutional: He appears well-developed and well-nourished. NAD. Vital signs reviewed. HENT: Normocephalic and atraumatic.  Eyes: EOM are normal. No discharge.  Cardiovascular: Normal rate and regular rhythm. No JVD. Respiratory: Effort normal. No respiratory  distress.  GI: Soft. Bowel sounds are normal.  Musculoskeletal: He exhibits no edema or tenderness.  Gait WNL. Neurological: He is alert and oriented.  Speech is clear.  Motor: 5/5 throughout  Skin: Skin is warm and dry. Intact.   Psychiatric: He has a normal mood and affect. His behavior is normal. Thought content normal    Assessment & Plan:  83 year old right-handed male with history of hypertension, COPD, remote tobacco abuse, coronary artery disease presents for follow up for right cerebellar infarct.  1. S/p right cerebellar infarct on 07/15/2015.              Completed therapies, cont HEP  Cont meds  2. Insomnia  Cont meds per PCP  3. Hypertension:               Elevated, however, pt states usually WNL.  He is going to follow up with PCP next month

## 2016-04-09 ENCOUNTER — Other Ambulatory Visit: Payer: Self-pay | Admitting: Family Medicine

## 2016-04-09 ENCOUNTER — Telehealth: Payer: Self-pay | Admitting: Family Medicine

## 2016-04-09 NOTE — Telephone Encounter (Signed)
Ok to refill 

## 2016-04-09 NOTE — Telephone Encounter (Signed)
Pt needs refill on xanax, new pharmacy [facility] [address]

## 2016-04-09 NOTE — Telephone Encounter (Signed)
ok 

## 2016-04-09 NOTE — Telephone Encounter (Deleted)
Medication called to pharmacy. 

## 2016-04-10 ENCOUNTER — Other Ambulatory Visit: Payer: Self-pay | Admitting: Family Medicine

## 2016-04-10 MED ORDER — ALPRAZOLAM 0.5 MG PO TABS: 0.5000 mg | ORAL_TABLET | Freq: Every day | ORAL | 2 refills | Status: DC

## 2016-04-10 NOTE — Telephone Encounter (Signed)
Pt would like his Xanax refill to go to [facility] on [address] as it is closer to his home. Per [facility] ok to refill. Med called to pharm.

## 2016-04-11 NOTE — Telephone Encounter (Signed)
ok 

## 2016-04-15 ENCOUNTER — Other Ambulatory Visit: Payer: Self-pay | Admitting: Family Medicine

## 2016-05-01 ENCOUNTER — Ambulatory Visit (INDEPENDENT_AMBULATORY_CARE_PROVIDER_SITE_OTHER): Payer: Medicare Other | Admitting: Family Medicine

## 2016-05-01 VITALS — BP 162/80 | HR 76 | Temp 97.5°F | Resp 18 | Ht 66.0 in | Wt 145.0 lb

## 2016-05-01 DIAGNOSIS — R21 Rash and other nonspecific skin eruption: Secondary | ICD-10-CM

## 2016-05-01 DIAGNOSIS — I2510 Atherosclerotic heart disease of native coronary artery without angina pectoris: Secondary | ICD-10-CM | POA: Diagnosis not present

## 2016-05-01 NOTE — Progress Notes (Signed)
Subjective:    Patient ID: Martin [name], male    DOB: 1930/07/24, 83 y.o.   MRN: [id number]  HPI   Patient was admitted to the hospital in May for right cerebellar stroke. I have copied relevant portions of the discharge summary and included them below for my reference:  DATE OF ADMISSION: 07/17/2015 DATE OF DISCHARGE: 07/26/2015   DISCHARGE SUMMARY   DISCHARGE DIAGNOSES: 1. Right cerebellar infarction. 2. SCDs for deep vein thrombosis prophylaxis. 3. Anxiety. 4. Hypertension. 5. Coronary artery disease with remote myocardial infarction. 6. Chronic obstructive pulmonary disease. 7. Chronic renal insufficiency. 8. Constipation, resolved.  HISTORY OF PRESENT ILLNESS: This is an 83 year old right-handed male with history of hypertension, COPD, remote tobacco abuse, coronary artery disease. Independent prior to admission living alone. He has a brother who lives across the street. Admitted Jul 15, 2015, with double vision and gait disturbance. MRI of the brain showed moderately large acute mid to inferior right cerebellar infarct. Remote right thalamic and left basal ganglia small infarcts. MRA of the head, right vertebral artery and right posterior-inferior cerebellar artery occluded. The patient did not receive tPA. Carotid Dopplers with left 40-59% ICA stenosis. Echocardiogram with ejection fraction of 63% grade 1 diastolic dysfunction. Neurology consulted, maintained on aspirin and Plavix therapy x3 months then Plavix alone. Repeat CT scan showed focal mass effect but no hydrocephalus. Tolerating a regular diet. The patient was admitted for a comprehensive rehab program.  PAST MEDICAL HISTORY: See discharge diagnoses.  SOCIAL HISTORY: Lives alone. Independent prior to admission.  FUNCTIONAL STATUS UPON ADMISSION TO REHAB SERVICES: Moderate assist 100 feet one-person handheld assistance, minimal guard sit to stand, min  to mod assist activities of daily living.  PHYSICAL EXAMINATION: VITAL SIGNS: Blood pressure 150/69, pulse 71, temperature 97, respirations 20. GENERAL: This was an alert male, in no acute distress. Oriented to person, place, and time. LUNGS: Clear to auscultation without wheeze. CARDIAC: Regular rate and rhythm. No murmur. ABDOMEN: Soft, nontender. Good bowel sounds.  REHABILITATION HOSPITAL COURSE: The patient was admitted to Inpatient Rehab Services with therapies initiated on a 3-hour daily basis, consisting of physical therapy, occupational therapy, and rehabilitation nursing. The following issues were addressed during the patient's rehabilitation stay. Pertaining to Mr. Arboleda right cerebellar infarct remained stable, maintained on aspirin and Plavix therapy. He would follow up Neurology Services. SCDs for DVT prophylaxis. No signs of DVT. He was using Xanax on a limited basis for anxiety with good results. Blood pressures controlled on Toprol. He would follow up with his primary MD. History of coronary artery disease, remote myocardial infarction. Continue Plavix and aspirin therapy. No chest pain or shortness of breath. He did have a history of COPD with remote tobacco abuse. He was using Dulera twice daily. Chronic renal insufficiency, latest creatinine 1.61, encouragement of fluids. Bouts of constipation resolved with laxative assistance. The patient received weekly collaborative interdisciplinary team conferences to discuss estimated length of stay, family teaching, any barriers to discharge. The patient ambulating with a small base quad cane distance supervision. Instructed the patient on stair negotiation of 12 steps with a right hand rail and supervision. High-level gait training through obstacle course, small base quad cane, stepping over and weaving around objects with supervision. He could gather his belongings for activities of  daily living and homemaking. Worked on standing balance using a foam surface in the gym while completing ball toss. It was discussed no driving. Full teaching completed and plan discharge to home with ongoing therapies dictated per  Rehab Services.  DISCHARGE MEDICATIONS: 1. Xanax 0.5 mg p.o. at bedtime as needed. 2. Aspirin 325 mg p.o. daily. 3. Lipitor 20 mg p.o. daily. 4. Plavix 75 mg p.o. daily. 5. Pepcid 20 mg p.o. b.i.d. 6. Toprol-XL 25 mg p.o. daily. 7. Dulera 2 puffs twice daily. 8. MiraLAX daily, hold for loose stools.  DIET: Regular.  FOLLOWUP: The patient would follow up with Dr. Delice Lesch, Rehab Service's office to call for appointment; Dr. Erlinda Hong, Neurology Service in 1 month; Dr. Jenna Luo, Medical Management.  09/27/15 I have not seen the patient since his discharge from the hospital.  Plan of neurology in the hospital was to continue aspirin and Plavix through the end of August and then discontinue aspirin to maintain on Plavix alone. Patient is still on dual antiplatelet therapy. He looks amazing today.  Given the severity of the stroke that he suffered I am very surprised to see him walking without assistance. He requires neither a cane or walker. He is able to drive. He does state that he occasionally loses his balance slightly and overall is doing remarkably well. His memory does not seem to be affected. He shows no evidence of delirium or confusion. His blood pressure today is elevated. I confirmed this number myself. Patient states that he has white coat syndrome. He is checking his blood pressure everyday at home and states that it typically is in the 063-016 range systolic. He denies any shortness of breath or chest pain. He discontinued pravastatin after the stroke and switch to Lipitor which was their recommendation. He is currently on Lipitor 20 mg a day. He denies any myalgias or right upper quadrant pain.  At that time, my plan was: Patient is doing  remarkably well after his stroke. Given the fact he is on dual antiplatelet therapy, will check a CBC to monitor for anemia. I recommended that he discontinue aspirin in August and remain only on Plavix 75 mg a day. His blood pressure is elevated today. The patient is convinced that is white coat syndrome. He will check his blood pressure daily at home and notify me of the values in one week. If consistently greater than 140/90, I would recommend starting the patient on angiotensin receptor blocker. I will check a hemoglobin A1c while the patient's ear. He has a history of diabetes mellitus. He is not fasting and therefore cannot check a fasting lipid panel but I recommended repeating a fasting lipid panel in 3 months on the Lipitor  05/01/16 Patient stopped his Lipitor on Sunday. He developed an erythematous rash in the antecubital fossa's bilaterally the consist of urticaria that coalesced into 1 large plaque. Is extremely itchy. Right is worse than left. He also has small red papules on his forehead. They're slightly violaceous in color. There is no other rash anywhere else on his body. He attributes this to the Lipitor and therefore he stopped it. He denies any chest pain or shortness of breath or dyspnea on exertion. His blood pressure today is significantly elevated at 162/80. He is still taking Plavix. Past Medical History:  Diagnosis Date  . Arthritis   . Bronchitis   . COPD (chronic obstructive pulmonary disease) ([place])   . Diabetes mellitus   . GERD (gastroesophageal reflux disease)   . H/O: GI bleed   . Hiatal hernia   . Hyperlipidemia   . Hypertension   . Myocardial infarction    1978 & 2011  . Peripheral vascular disease, unspecified   . Renal artery  stenosis ([place])   . Stroke [facility])    Past Surgical History:  Procedure Laterality Date  . Aortobifemoral BPG  07/22/10   and Right femoral-popliteal BPG   Current Outpatient Prescriptions on File Prior to Visit  Medication Sig  Dispense Refill  . ALPRAZolam (XANAX) 0.5 MG tablet Take 1 tablet (0.5 mg total) by mouth at bedtime. 30 tablet 2  . clopidogrel (PLAVIX) 75 MG tablet Take 1 tablet (75 mg total) by mouth daily. 90 tablet 1  . metoprolol succinate (TOPROL-XL) 25 MG 24 hr tablet TAKE ONE TABLET BY MOUTH ONCE DAILY 90 tablet 3  . NITROSTAT 0.4 MG SL tablet DISSOLVE ONE TABLET UNDER THE TONGUE AS DIRECTED 25 tablet 0  . atorvastatin (LIPITOR) 20 MG tablet TAKE ONE TABLET BY MOUTH ONCE DAILY AT 6PM (Patient not taking: Reported on 04/03/2016) 30 tablet 3  . fluticasone furoate-vilanterol (BREO ELLIPTA) 200-25 MCG/INH AEPB Inhale 1 puff into the lungs daily. (Patient not taking: Reported on 01/17/2016) 60 each 0  . SYMBICORT 160-4.5 MCG/ACT inhaler INHALE ONE PUFF BY MOUTH TWICE DAILY (Patient not taking: Reported on 01/17/2016) 11 g 6   No current facility-administered medications on file prior to visit.    Allergies  Allergen Reactions  . Penicillins Rash   Social History   Social History  . Marital status: Widowed    Spouse name: N/A  . Number of children: N/A  . Years of education: N/A   Occupational History  . Not on file.   Social History Main Topics  . Smoking status: Former Smoker    Years: 65.00    Types: Cigarettes    Quit date: 07/08/2009  . Smokeless tobacco: Never Used  . Alcohol use No  . Drug use: No  . Sexual activity: Not on file   Other Topics Concern  . Not on file   Social History Narrative  . No narrative on file     Review of Systems  All other systems reviewed and are negative.      Objective:   Physical Exam  Constitutional: He is oriented to person, place, and time. He appears well-developed and well-nourished. No distress.  Neck: Neck supple. No JVD present.  Cardiovascular: Normal rate, regular rhythm and normal heart sounds.   Pulmonary/Chest: Effort normal and breath sounds normal. No respiratory distress. He has no wheezes. He has no rales.  Abdominal: Soft.  Bowel sounds are normal.  Musculoskeletal: He exhibits no edema.  Lymphadenopathy:    He has no cervical adenopathy.  Neurological: He is alert and oriented to person, place, and time. He displays normal reflexes. No cranial nerve deficit. He exhibits normal muscle tone.  Skin: Rash noted. He is not diaphoretic. There is erythema.  Psychiatric: He has a normal mood and affect. His behavior is normal. Judgment and thought content normal.  Vitals reviewed.         Assessment & Plan:  Dermatitis NOS consistent with urticaria.  I am not convinced that this is due to the Lipitor. I recommended waiting until Monday. If the rash does not improve after 1 week off the Lipitor I believe this would be prudent that the Lipitor is innocent and I will have the patient resume the Lipitor. I would then treat the rash with Elocon and an antihistamine. If the rash does improve, I will try the patient on a different statin. He would've been failed Crestor and Lipitor solid try milder less potent statin such as pravastatin.

## 2016-05-06 ENCOUNTER — Telehealth: Payer: Self-pay | Admitting: Family Medicine

## 2016-05-06 MED ORDER — CLOPIDOGREL BISULFATE 75 MG PO TABS: 75.0000 mg | ORAL_TABLET | Freq: Every day | ORAL | 3 refills | Status: DC

## 2016-05-06 MED ORDER — MOMETASONE FUROATE 0.1 % EX CREA: 1.0000 "application " | TOPICAL_CREAM | Freq: Every day | CUTANEOUS | 0 refills | Status: AC

## 2016-05-06 NOTE — Telephone Encounter (Signed)
Pt needs refill on plavix sent to [facility] [address]. Also says doctor pickard was suppose to call in a cream or ointment for his rash but it hasnt been called in yet either. Please call him back.

## 2016-05-06 NOTE — Telephone Encounter (Signed)
Pt states that the rash has not gotten any worse or any better - per your note I sent in for the Elocon and pt restarted his Lipitor. Do you want him on an antihistamine as well? If so which one?

## 2016-05-06 NOTE — Telephone Encounter (Signed)
Try Elocon first. If not improving by friday, I will start prednisone taper pack

## 2016-05-07 NOTE — Telephone Encounter (Signed)
Pt aware and will call if no better

## 2016-05-12 ENCOUNTER — Ambulatory Visit: Payer: Medicare Other | Admitting: Neurology

## 2016-05-23 ENCOUNTER — Other Ambulatory Visit: Payer: Self-pay | Admitting: Family Medicine

## 2016-05-23 MED ORDER — ATORVASTATIN CALCIUM 20 MG PO TABS: ORAL_TABLET | ORAL | 1 refills | Status: DC

## 2016-07-08 ENCOUNTER — Other Ambulatory Visit: Payer: Self-pay | Admitting: Family Medicine

## 2016-07-08 NOTE — Telephone Encounter (Signed)
ok 

## 2016-07-08 NOTE — Telephone Encounter (Signed)
Medication called to pharmacy. 

## 2016-07-08 NOTE — Telephone Encounter (Signed)
Ok to refill 

## 2016-07-23 ENCOUNTER — Other Ambulatory Visit: Payer: Self-pay | Admitting: Family Medicine

## 2016-10-09 ENCOUNTER — Other Ambulatory Visit: Payer: Self-pay | Admitting: Family Medicine

## 2016-10-09 NOTE — Telephone Encounter (Signed)
ok 

## 2016-10-09 NOTE — Telephone Encounter (Signed)
Ok to refill 

## 2016-12-20 DIAGNOSIS — Z23 Encounter for immunization: Secondary | ICD-10-CM | POA: Diagnosis not present

## 2016-12-25 ENCOUNTER — Other Ambulatory Visit: Payer: Self-pay | Admitting: Family Medicine

## 2016-12-26 ENCOUNTER — Other Ambulatory Visit: Payer: Self-pay | Admitting: Family Medicine

## 2017-01-08 ENCOUNTER — Other Ambulatory Visit: Payer: Self-pay | Admitting: Family Medicine

## 2017-01-08 NOTE — Telephone Encounter (Signed)
Ok to refill 

## 2017-01-08 NOTE — Telephone Encounter (Signed)
ok 

## 2017-01-09 ENCOUNTER — Encounter: Payer: Self-pay | Admitting: Family Medicine

## 2017-01-22 ENCOUNTER — Ambulatory Visit (HOSPITAL_COMMUNITY)
Admission: RE | Admit: 2017-01-22 | Discharge: 2017-01-22 | Disposition: A | Discharge status: Home / Self Care | Payer: Medicare Other | Source: Ambulatory Visit | Attending: Vascular Surgery | Admitting: Vascular Surgery

## 2017-01-22 ENCOUNTER — Ambulatory Visit (INDEPENDENT_AMBULATORY_CARE_PROVIDER_SITE_OTHER): Payer: Medicare Other | Admitting: Vascular Surgery

## 2017-01-22 ENCOUNTER — Ambulatory Visit (INDEPENDENT_AMBULATORY_CARE_PROVIDER_SITE_OTHER)
Admission: RE | Admit: 2017-01-22 | Discharge: 2017-01-22 | Disposition: A | Discharge status: Home / Self Care | Payer: Medicare Other | Source: Ambulatory Visit | Attending: Vascular Surgery | Admitting: Vascular Surgery

## 2017-01-22 ENCOUNTER — Encounter: Payer: Self-pay | Admitting: Vascular Surgery

## 2017-01-22 VITALS — BP 174/81 | HR 68 | Temp 97.3°F | Resp 18 | Ht 66.0 in | Wt 147.0 lb

## 2017-01-22 DIAGNOSIS — I739 Peripheral vascular disease, unspecified: Secondary | ICD-10-CM | POA: Insufficient documentation

## 2017-01-22 DIAGNOSIS — I2510 Atherosclerotic heart disease of native coronary artery without angina pectoris: Secondary | ICD-10-CM | POA: Diagnosis not present

## 2017-01-22 DIAGNOSIS — I6523 Occlusion and stenosis of bilateral carotid arteries: Secondary | ICD-10-CM

## 2017-01-22 LAB — VAS US CAROTID
LCCADDIAS: 13 cm/s
LCCADSYS: 84 cm/s
LEFT ECA DIAS: -8 cm/s
LEFT VERTEBRAL DIAS: -17 cm/s
LICADDIAS: -18 cm/s
LICADSYS: -76 cm/s
LICAPSYS: 317 cm/s
Left CCA prox dias: 12 cm/s
Left CCA prox sys: 145 cm/s
Left ICA prox dias: 42 cm/s
RIGHT CCA MID DIAS: 8 cm/s
Right cca dist sys: -68 cm/s

## 2017-01-22 NOTE — Progress Notes (Signed)
Patient is an 83 year old male who returns for follow-up today. He previously underwent aorta bifemoral bypass and a right femoropopliteal in 2012. The right femoropopliteal has been occluded for several years. He has mild to moderate claudication symptoms but is still very active overall and is able to play golf and do the activities that he wishes. He denies any rest pain. He has no nonhealing wounds. He had a stroke one year ago but has recovered fairly well from this. He is on Plavix and Lipitor. We have also been following him for carotid occlusive disease.  Current Outpatient Medications on File Prior to Visit  Medication Sig Dispense Refill  . ALPRAZolam (XANAX) 0.5 MG tablet TAKE 1 TABLET BY MOUTH AT BEDTIME 30 tablet 2  . atorvastatin (LIPITOR) 20 MG tablet Take 1 tablet (20 mg total) by mouth at bedtime. Needs office visit and labs before further refills 90 tablet 0  . atorvastatin (LIPITOR) 20 MG tablet TAKE ONE TABLET BY MOUTH ONCE DAILY AT 6 PM. 90 tablet 1  . clopidogrel (PLAVIX) 75 MG tablet Take 1 tablet (75 mg total) by mouth daily. 90 tablet 3  . famotidine (PEPCID) 20 MG tablet Take 20 mg by mouth 2 (two) times daily.    . famotidine (PEPCID) 20 MG tablet TAKE ONE TABLET BY MOUTH TWICE DAILY 60 tablet 5  . fluticasone furoate-vilanterol (BREO ELLIPTA) 200-25 MCG/INH AEPB Inhale 1 puff into the lungs daily. 60 each 0  . metoprolol succinate (TOPROL-XL) 25 MG 24 hr tablet TAKE ONE TABLET BY MOUTH ONCE DAILY 90 tablet 3  . NITROSTAT 0.4 MG SL tablet DISSOLVE ONE TABLET UNDER THE TONGUE AS DIRECTED 25 tablet 0  . SYMBICORT 160-4.5 MCG/ACT inhaler INHALE ONE PUFF BY MOUTH TWICE DAILY 11 g 6   No current facility-administered medications on file prior to visit.    Review of systems: He denies chest pain. He has shortness of breath with severe exertion. He states his COPD has gotten a little worse.  Physical exam:  Vitals:   01/22/17 1508 01/22/17 1510  BP: (!) 184/80 (!) 174/81   Pulse: 68   Resp: 18   Temp: (!) 97.3 F (36.3 C)   TempSrc: Oral   SpO2: 96%   Weight: 147 lb (66.7 kg)   Height: 5\' 6"  (1.676 m)     Neck: No carotid bruits. Chest: Clear to auscultation bilaterally  Cardiac: Regular rate and rhythm  Abdomen: Soft nontender no mass  Extremities: 2+ femoral pulses absent popliteal pedal pulses bilaterally no ulcerations on the feet  Data: Patient had a bilateral carotid duplex exam today which showed less than 40% right internal carotid artery stenosis 40-60% left internal carotid artery stenosis no change since 2017.  he had bilateral ABIs performed which are 0.4 on the right 0.7 on the left also unchanged over the last several years.  Assessment: Peripheral arterial disease lower extremities stable symptoms with short distance claudication but tolerable.  Carotid occlusive disease bilateral carotid stenosis but both much less than 80%.  Plan: Follow-up 1 year bilateral ABIs carotid duplex scan. He will follow-up sooner if he develops nonhealing wounds on the foot or symptoms of carotid stenosis is just TIA amaurosis or stroke.  Ruta Hinds, MD [facility] Office: [phone] Pager: [phone]

## 2017-01-23 ENCOUNTER — Ambulatory Visit (INDEPENDENT_AMBULATORY_CARE_PROVIDER_SITE_OTHER): Payer: Medicare Other | Admitting: Family Medicine

## 2017-01-23 ENCOUNTER — Encounter: Payer: Self-pay | Admitting: Family Medicine

## 2017-01-23 VITALS — BP 130/80 | HR 67 | Temp 97.9°F | Resp 14 | Ht 66.0 in | Wt 146.0 lb

## 2017-01-23 DIAGNOSIS — I639 Cerebral infarction, unspecified: Secondary | ICD-10-CM | POA: Diagnosis not present

## 2017-01-23 DIAGNOSIS — I70219 Atherosclerosis of native arteries of extremities with intermittent claudication, unspecified extremity: Secondary | ICD-10-CM

## 2017-01-23 DIAGNOSIS — I10 Essential (primary) hypertension: Secondary | ICD-10-CM | POA: Diagnosis not present

## 2017-01-23 DIAGNOSIS — R7303 Prediabetes: Secondary | ICD-10-CM

## 2017-01-23 DIAGNOSIS — I2510 Atherosclerotic heart disease of native coronary artery without angina pectoris: Secondary | ICD-10-CM | POA: Diagnosis not present

## 2017-01-23 NOTE — Progress Notes (Signed)
Subjective:    Patient ID: Martin [name], male    DOB: August 10, 1930, 83 y.o.   MRN: [id number]  HPI 05/01/16 Patient was admitted to the hospital in May for right cerebellar stroke. I have copied relevant portions of the discharge summary and included them below for my reference:  DATE OF ADMISSION: 07/17/2015 DATE OF DISCHARGE: 07/26/2015   DISCHARGE SUMMARY   DISCHARGE DIAGNOSES: 1. Right cerebellar infarction. 2. SCDs for deep vein thrombosis prophylaxis. 3. Anxiety. 4. Hypertension. 5. Coronary artery disease with remote myocardial infarction. 6. Chronic obstructive pulmonary disease. 7. Chronic renal insufficiency. 8. Constipation, resolved.  HISTORY OF PRESENT ILLNESS: This is an 83 year old right-handed male with history of hypertension, COPD, remote tobacco abuse, coronary artery disease. Independent prior to admission living alone. He has a brother who lives across the street. Admitted Jul 15, 2015, with double vision and gait disturbance. MRI of the brain showed moderately large acute mid to inferior right cerebellar infarct. Remote right thalamic and left basal ganglia small infarcts. MRA of the head, right vertebral artery and right posterior-inferior cerebellar artery occluded. The patient did not receive tPA. Carotid Dopplers with left 40-59% ICA stenosis. Echocardiogram with ejection fraction of 44% grade 1 diastolic dysfunction. Neurology consulted, maintained on aspirin and Plavix therapy x3 months then Plavix alone. Repeat CT scan showed focal mass effect but no hydrocephalus. Tolerating a regular diet. The patient was admitted for a comprehensive rehab program.  PAST MEDICAL HISTORY: See discharge diagnoses.  SOCIAL HISTORY: Lives alone. Independent prior to admission.  FUNCTIONAL STATUS UPON ADMISSION TO REHAB SERVICES: Moderate assist 100 feet one-person handheld assistance, minimal guard sit to stand, min  to mod assist activities of daily living.  PHYSICAL EXAMINATION: VITAL SIGNS: Blood pressure 150/69, pulse 71, temperature 97, respirations 20. GENERAL: This was an alert male, in no acute distress. Oriented to person, place, and time. LUNGS: Clear to auscultation without wheeze. CARDIAC: Regular rate and rhythm. No murmur. ABDOMEN: Soft, nontender. Good bowel sounds.  REHABILITATION HOSPITAL COURSE: The patient was admitted to Inpatient Rehab Services with therapies initiated on a 3-hour daily basis, consisting of physical therapy, occupational therapy, and rehabilitation nursing. The following issues were addressed during the patient's rehabilitation stay. Pertaining to Mr. Finnigan right cerebellar infarct remained stable, maintained on aspirin and Plavix therapy. He would follow up Neurology Services. SCDs for DVT prophylaxis. No signs of DVT. He was using Xanax on a limited basis for anxiety with good results. Blood pressures controlled on Toprol. He would follow up with his primary MD. History of coronary artery disease, remote myocardial infarction. Continue Plavix and aspirin therapy. No chest pain or shortness of breath. He did have a history of COPD with remote tobacco abuse. He was using Dulera twice daily. Chronic renal insufficiency, latest creatinine 1.61, encouragement of fluids. Bouts of constipation resolved with laxative assistance. The patient received weekly collaborative interdisciplinary team conferences to discuss estimated length of stay, family teaching, any barriers to discharge. The patient ambulating with a small base quad cane distance supervision. Instructed the patient on stair negotiation of 12 steps with a right hand rail and supervision. High-level gait training through obstacle course, small base quad cane, stepping over and weaving around objects with supervision. He could gather his belongings for activities of  daily living and homemaking. Worked on standing balance using a foam surface in the gym while completing ball toss. It was discussed no driving. Full teaching completed and plan discharge to home with ongoing therapies dictated per Rehab  Services.  DISCHARGE MEDICATIONS: 1. Xanax 0.5 mg p.o. at bedtime as needed. 2. Aspirin 325 mg p.o. daily. 3. Lipitor 20 mg p.o. daily. 4. Plavix 75 mg p.o. daily. 5. Pepcid 20 mg p.o. b.i.d. 6. Toprol-XL 25 mg p.o. daily. 7. Dulera 2 puffs twice daily. 8. MiraLAX daily, hold for loose stools.  DIET: Regular.  FOLLOWUP: The patient would follow up with Dr. Delice Lesch, Rehab Service's office to call for appointment; Dr. Erlinda Hong, Neurology Service in 1 month; Dr. Jenna Luo, Medical Management.  09/27/15 I have not seen the patient since his discharge from the hospital.  Plan of neurology in the hospital was to continue aspirin and Plavix through the end of August and then discontinue aspirin to maintain on Plavix alone. Patient is still on dual antiplatelet therapy. He looks amazing today.  Given the severity of the stroke that he suffered I am very surprised to see him walking without assistance. He requires neither a cane or walker. He is able to drive. He does state that he occasionally loses his balance slightly and overall is doing remarkably well. His memory does not seem to be affected. He shows no evidence of delirium or confusion. His blood pressure today is elevated. I confirmed this number myself. Patient states that he has white coat syndrome. He is checking his blood pressure everyday at home and states that it typically is in the 182-993 range systolic. He denies any shortness of breath or chest pain. He discontinued pravastatin after the stroke and switch to Lipitor which was their recommendation. He is currently on Lipitor 20 mg a day. He denies any myalgias or right upper quadrant pain.  At that time, my plan was: Patient is doing  remarkably well after his stroke. Given the fact he is on dual antiplatelet therapy, will check a CBC to monitor for anemia. I recommended that he discontinue aspirin in August and remain only on Plavix 75 mg a day. His blood pressure is elevated today. The patient is convinced that is white coat syndrome. He will check his blood pressure daily at home and notify me of the values in one week. If consistently greater than 140/90, I would recommend starting the patient on angiotensin receptor blocker. I will check a hemoglobin A1c while the patient's ear. He has a history of diabetes mellitus. He is not fasting and therefore cannot check a fasting lipid panel but I recommended repeating a fasting lipid panel in 3 months on the Lipitor  05/01/16 Patient stopped his Lipitor on Sunday. He developed an erythematous rash in the antecubital fossa's bilaterally the consist of urticaria that coalesced into 1 large plaque. Is extremely itchy. Right is worse than left. He also has small red papules on his forehead. They're slightly violaceous in color. There is no other rash anywhere else on his body. He attributes this to the Lipitor and therefore he stopped it. He denies any chest pain or shortness of breath or dyspnea on exertion. His blood pressure today is significantly elevated at 162/80. He is still taking Plavix. At that time, my plan was: I am not convinced that this is due to the Lipitor. I recommended waiting until Monday. If the rash does not improve after 1 week off the Lipitor I believe this would be prudent that the Lipitor is innocent and I will have the patient resume the Lipitor. I would then treat the rash with Elocon and an antihistamine. If the rash does improve, I will try the patient on  a different statin. He would've been failed Crestor and Lipitor solid try milder less potent statin such as pravastatin.  01/23/17 Patient is here today for regular follow-up of his chronic medical conditions.  His  blood pressure today is well controlled at 130/80.  He denies any chest pain shortness of breath or dyspnea on exertion.  He is currently on Plavix for secondary prevention of stroke.  Last May, the patient had a cerebellar stroke and has been on Plavix since that time.  Problem list includes a history of atrial fibrillation.  However patient is in normal sinus rhythm today.  He denies any palpitations, near syncope, or syncopal episodes.  He denies any myalgias or right upper quadrant pain.  He is overdue for fasting lab work to monitor his cholesterol as well as lab work to monitor his prediabetes.  He is still managing his finances.  He denies any herbal managing his finances.  He is still driving.  He is still active and playing golf.  He denies any problems with falling although he is a little bit unsteady on his feet ever since his cerebellar stroke.  Despite this, he is still not using a cane.  He denies any trouble with his memory.  He denies any depression or anhedonia. Past Medical History:  Diagnosis Date  . Arthritis   . Bronchitis   . COPD (chronic obstructive pulmonary disease) ([place])   . Diabetes mellitus   . GERD (gastroesophageal reflux disease)   . H/O: GI bleed   . Hiatal hernia   . Hyperlipidemia   . Hypertension   . Myocardial infarction [facility])    1978 & 2011  . Peripheral vascular disease, unspecified ([place])   . Renal artery stenosis ([place])   . Stroke [facility])    Past Surgical History:  Procedure Laterality Date  . Aortobifemoral BPG  07/22/10   and Right femoral-popliteal BPG   Current Outpatient Medications on File Prior to Visit  Medication Sig Dispense Refill  . ALPRAZolam (XANAX) 0.5 MG tablet TAKE 1 TABLET BY MOUTH AT BEDTIME 30 tablet 2  . atorvastatin (LIPITOR) 20 MG tablet Take 1 tablet (20 mg total) by mouth at bedtime. Needs office visit and labs before further refills 90 tablet 0  . clopidogrel (PLAVIX) 75 MG tablet Take 1 tablet (75 mg total) by mouth daily. 90  tablet 3  . famotidine (PEPCID) 20 MG tablet Take 20 mg at bedtime by mouth.     . metoprolol succinate (TOPROL-XL) 25 MG 24 hr tablet TAKE ONE TABLET BY MOUTH ONCE DAILY 90 tablet 3  . NITROSTAT 0.4 MG SL tablet DISSOLVE ONE TABLET UNDER THE TONGUE AS DIRECTED 25 tablet 0   No current facility-administered medications on file prior to visit.    Allergies  Allergen Reactions  . Penicillins Rash   Social History   Socioeconomic History  . Marital status: Widowed    Spouse name: Not on file  . Number of children: Not on file  . Years of education: Not on file  . Highest education level: Not on file  Social Needs  . Financial resource strain: Not on file  . Food insecurity - worry: Not on file  . Food insecurity - inability: Not on file  . Transportation needs - medical: Not on file  . Transportation needs - non-medical: Not on file  Occupational History  . Not on file  Tobacco Use  . Smoking status: Former Smoker    Years: 65.00    Types:  Cigarettes    Last attempt to quit: 07/08/2009    Years since quitting: 7.5  . Smokeless tobacco: Never Used  Substance and Sexual Activity  . Alcohol use: No  . Drug use: No  . Sexual activity: Not on file  Other Topics Concern  . Not on file  Social History Narrative  . Not on file     Review of Systems  All other systems reviewed and are negative.      Objective:   Physical Exam  Constitutional: He is oriented to person, place, and time. He appears well-developed and well-nourished. No distress.  Neck: Neck supple. No JVD present.  Cardiovascular: Normal rate, regular rhythm and normal heart sounds.  Pulmonary/Chest: Effort normal and breath sounds normal. No respiratory distress. He has no wheezes. He has no rales.  Abdominal: Soft. Bowel sounds are normal.  Musculoskeletal: He exhibits no edema.  Lymphadenopathy:    He has no cervical adenopathy.  Neurological: He is alert and oriented to person, place, and time. He  displays normal reflexes. No cranial nerve deficit. He exhibits normal muscle tone.  Skin: No rash noted. He is not diaphoretic. No erythema.  Psychiatric: He has a normal mood and affect. His behavior is normal. Judgment and thought content normal.  Vitals reviewed.         Assessment & Plan:  Prediabetes - Plan: COMPLETE METABOLIC PANEL WITH GFR, CBC with Differential/Platelet, Lipid panel, Hemoglobin A1c, Microalbumin, urine  ASCVD (arteriosclerotic cardiovascular disease)  Essential hypertension  Cerebellar stroke (HCC)  Coronary artery disease involving native coronary artery of native heart without angina pectoris  Atherosclerotic PVD with intermittent claudication (HCC)  Patient's blood pressure today is acceptable.  I will make no changes in his antihypertensive therapy.  I have recommended a flu shot which he is already received.  Regarding his ASCVD and history of stroke, I will check a fasting lipid panel.  His goal LDL cholesterol is less than 70.  Regarding his prediabetes, I will check a hemoglobin A1c.  His goal hemoglobin A1c is less than 6.5.  I will also monitor urine microalbumin.  I will check a CBC to rule out an occult anemia given the fact the patient is taking long-term antiplatelet therapy with Plavix.  I will also monitor his liver and kidney function test.  Otherwise the patient is doing well.  There is no evidence of dementia at the present time and he is still independent with a good quality of life

## 2017-01-24 LAB — LIPID PANEL
CHOLESTEROL: 105 mg/dL (ref <200)
HDL: 48 mg/dL (ref >40)
LDL Cholesterol (Calc): 34 mg/dL (calc)
NON-HDL CHOLESTEROL (CALC): 57 mg/dL (ref <130)
TRIGLYCERIDES: 147 mg/dL (ref <150)
Total CHOL/HDL Ratio: 2.2 (calc) (ref <5.0)

## 2017-01-24 LAB — HEMOGLOBIN A1C
Hgb A1c MFr Bld: 6.3 % of total Hgb — ABNORMAL HIGH (ref <5.7)
Mean Plasma Glucose: 134 (calc)
eAG (mmol/L): 7.4 (calc)

## 2017-01-24 LAB — CBC WITH DIFFERENTIAL/PLATELET
BASOS PCT: 0.4 %
Basophils Absolute: 30 cells/uL (ref 0–200)
EOS PCT: 1.6 %
Eosinophils Absolute: 122 cells/uL (ref 15–500)
HCT: 45.7 % (ref 38.5–50.0)
Hemoglobin: 15.9 g/dL (ref 13.2–17.1)
Lymphs Abs: 1680 cells/uL (ref 850–3900)
MCH: 32.4 pg (ref 27.0–33.0)
MCHC: 34.8 g/dL (ref 32.0–36.0)
MCV: 93.1 fL (ref 80.0–100.0)
MONOS PCT: 7.6 %
MPV: 11.6 fL (ref 7.5–12.5)
NEUTROS PCT: 68.3 %
Neutro Abs: 5191 cells/uL (ref 1500–7800)
PLATELETS: 168 10*3/uL (ref 140–400)
RBC: 4.91 10*6/uL (ref 4.20–5.80)
RDW: 11.9 % (ref 11.0–15.0)
TOTAL LYMPHOCYTE: 22.1 %
WBC mixed population: 578 cells/uL (ref 200–950)
WBC: 7.6 10*3/uL (ref 3.8–10.8)

## 2017-01-24 LAB — COMPLETE METABOLIC PANEL WITH GFR
AG Ratio: 1.4 (calc) (ref 1.0–2.5)
ALBUMIN MSPROF: 4 g/dL (ref 3.6–5.1)
ALKALINE PHOSPHATASE (APISO): 86 U/L (ref 40–115)
ALT: 20 U/L (ref 9–46)
AST: 24 U/L (ref 10–35)
BILIRUBIN TOTAL: 0.9 mg/dL (ref 0.2–1.2)
BUN / CREAT RATIO: 20 (calc) (ref 6–22)
BUN: 27 mg/dL — ABNORMAL HIGH (ref 7–25)
CHLORIDE: 102 mmol/L (ref 98–110)
CO2: 30 mmol/L (ref 20–32)
CREATININE: 1.33 mg/dL — AB (ref 0.70–1.11)
Calcium: 9.1 mg/dL (ref 8.6–10.3)
GFR, Est African American: 56 mL/min/{1.73_m2} — ABNORMAL LOW (ref >=60)
GFR, Est Non African American: 49 mL/min/{1.73_m2} — ABNORMAL LOW (ref >=60)
GLOBULIN: 2.8 g/dL (ref 1.9–3.7)
Glucose, Bld: 116 mg/dL — ABNORMAL HIGH (ref 65–99)
POTASSIUM: 4.2 mmol/L (ref 3.5–5.3)
SODIUM: 139 mmol/L (ref 135–146)
Total Protein: 6.8 g/dL (ref 6.1–8.1)

## 2017-01-26 ENCOUNTER — Encounter: Payer: Self-pay | Admitting: Family Medicine

## 2017-02-05 NOTE — Addendum Note (Signed)
Addended by: Lianne Cure A on: 02/05/2017 10:13 AM   Modules accepted: Orders

## 2017-02-12 ENCOUNTER — Other Ambulatory Visit: Payer: Self-pay

## 2017-04-03 ENCOUNTER — Ambulatory Visit: Payer: Medicare Other | Admitting: Physical Medicine & Rehabilitation

## 2017-04-10 ENCOUNTER — Other Ambulatory Visit: Payer: Self-pay | Admitting: Family Medicine

## 2017-04-10 NOTE — Telephone Encounter (Signed)
Ok to refill??  Last office visit 01/23/2017.  Last refill 01/09/2017.

## 2017-04-14 ENCOUNTER — Other Ambulatory Visit: Payer: Self-pay | Admitting: Family Medicine

## 2017-04-23 ENCOUNTER — Ambulatory Visit (INDEPENDENT_AMBULATORY_CARE_PROVIDER_SITE_OTHER): Payer: Medicare Other | Admitting: Family Medicine

## 2017-04-23 ENCOUNTER — Encounter: Payer: Self-pay | Admitting: Family Medicine

## 2017-04-23 VITALS — BP 144/74 | HR 60 | Temp 98.4°F | Resp 14 | Ht 66.0 in | Wt 149.0 lb

## 2017-04-23 DIAGNOSIS — L989 Disorder of the skin and subcutaneous tissue, unspecified: Secondary | ICD-10-CM

## 2017-04-23 DIAGNOSIS — D485 Neoplasm of uncertain behavior of skin: Secondary | ICD-10-CM

## 2017-04-23 NOTE — Progress Notes (Signed)
Subjective:    Patient ID: Martin [name], male    DOB: [birth year], 84 y.o.   MRN: [id number]  HPI  Patient reports a one-month history of a growing mass on the dorsum of his left forearm.  It is approximately 1.7 cm in diameter.  It is a dome-shaped pink nodule with multiple telangiectasias streaking across the surface.  It is firm to palpation.  There is no fluctuance.  Patient states it bleeds if manipulated Past Medical History:  Diagnosis Date  . Arthritis   . Bronchitis   . COPD (chronic obstructive pulmonary disease) ([place])   . Diabetes mellitus   . GERD (gastroesophageal reflux disease)   . H/O: GI bleed   . Hiatal hernia   . Hyperlipidemia   . Hypertension   . Myocardial infarction [facility])    1978 & 2011  . Peripheral vascular disease, unspecified ([place])   . Renal artery stenosis ([place])   . Stroke [facility])    Past Surgical History:  Procedure Laterality Date  . Aortobifemoral BPG  07/22/10   and Right femoral-popliteal BPG   Current Outpatient Medications on File Prior to Visit  Medication Sig Dispense Refill  . ALPRAZolam (XANAX) 0.5 MG tablet TAKE 1 TABLET BY MOUTH AT BEDTIME 30 tablet 2  . atorvastatin (LIPITOR) 20 MG tablet Take 1 tablet (20 mg total) by mouth at bedtime. Needs office visit and labs before further refills 90 tablet 0  . clopidogrel (PLAVIX) 75 MG tablet Take 1 tablet (75 mg total) by mouth daily. 90 tablet 3  . famotidine (PEPCID) 20 MG tablet Take 20 mg at bedtime by mouth.     . metoprolol succinate (TOPROL-XL) 25 MG 24 hr tablet TAKE ONE TABLET BY MOUTH ONCE DAILY 90 tablet 3  . NITROSTAT 0.4 MG SL tablet DISSOLVE ONE TABLET UNDER THE TONGUE AS DIRECTED 25 tablet 0   No current facility-administered medications on file prior to visit.    Allergies  Allergen Reactions  . Penicillins Rash   Social History   Socioeconomic History  . Marital status: Widowed    Spouse name: Not on file  . Number of children: Not on file  . Years of education:  Not on file  . Highest education level: Not on file  Social Needs  . Financial resource strain: Not on file  . Food insecurity - worry: Not on file  . Food insecurity - inability: Not on file  . Transportation needs - medical: Not on file  . Transportation needs - non-medical: Not on file  Occupational History  . Not on file  Tobacco Use  . Smoking status: Former Smoker    Years: 65.00    Types: Cigarettes    Last attempt to quit: 07/08/2009    Years since quitting: 7.7  . Smokeless tobacco: Never Used  Substance and Sexual Activity  . Alcohol use: No  . Drug use: No  . Sexual activity: Not on file  Other Topics Concern  . Not on file  Social History Narrative  . Not on file     Review of Systems  All other systems reviewed and are negative.      Objective:   Physical Exam  Constitutional: He appears well-developed and well-nourished.  Cardiovascular: Normal rate, regular rhythm and normal heart sounds.  Pulmonary/Chest: Effort normal and breath sounds normal.  Musculoskeletal:       Left forearm: He exhibits deformity.       Arms: Vitals reviewed.  Assessment & Plan:  Skin lesion of left arm - Plan: Pathology  Lesion appears to be a nodular basal cell carcinoma versus a keratoacanthoma versus a squamous cell carcinoma.  Recommended a biopsy.  Lesion was anesthetized with 0.1% lidocaine without epinephrine.  Shave biopsy was performed down to the underlying dermis of the entire lesion.  Hemostasis was achieved with Drysol, and a pressure dressing.  Wound care was discussed.  Lesion was sent to pathology in a labeled container.  I anticipate that he will need wide excision but I will await the biopsy results first.

## 2017-04-27 LAB — PATHOLOGY

## 2017-04-27 LAB — TISSUE SPECIMEN

## 2017-05-01 ENCOUNTER — Encounter: Payer: Self-pay | Admitting: Family Medicine

## 2017-05-01 ENCOUNTER — Ambulatory Visit (INDEPENDENT_AMBULATORY_CARE_PROVIDER_SITE_OTHER): Payer: Medicare Other | Admitting: Family Medicine

## 2017-05-01 VITALS — BP 138/76 | HR 68 | Temp 97.7°F | Resp 14 | Ht 66.0 in | Wt 149.0 lb

## 2017-05-01 DIAGNOSIS — C44629 Squamous cell carcinoma of skin of left upper limb, including shoulder: Secondary | ICD-10-CM

## 2017-05-01 MED ORDER — CLOPIDOGREL BISULFATE 75 MG PO TABS: 75.0000 mg | ORAL_TABLET | Freq: Every day | ORAL | 3 refills | Status: DC

## 2017-05-01 NOTE — Progress Notes (Signed)
Subjective:    Patient ID: Martin [name], male    DOB: 02/01/1931, 84 y.o.   MRN: [id number]  HPI 04/23/17 Patient reports a one-month history of a growing mass on the dorsum of his left forearm.  It is approximately 1.7 cm in diameter.  It is a dome-shaped pink nodule with multiple telangiectasias streaking across the surface.  It is firm to palpation.  There is no fluctuance.  Patient states it bleeds if manipulated.  At that time, my plan was: Lesion appears to be a nodular basal cell carcinoma versus a keratoacanthoma versus a squamous cell carcinoma.  Recommended a biopsy.  Lesion was anesthetized with 0.1% lidocaine without epinephrine.  Shave biopsy was performed down to the underlying dermis of the entire lesion.  Hemostasis was achieved with Drysol, and a pressure dressing.  Wound care was discussed.  Lesion was sent to pathology in a labeled container.  I anticipate that he will need wide excision but I will await the biopsy results first.  05/01/17 Biopsy revealed squamous cell carcinoma deep margin was involved.  Patient is here today for wider excision. Past Medical History:  Diagnosis Date  . Arthritis   . Bronchitis   . COPD (chronic obstructive pulmonary disease) ([place])   . Diabetes mellitus   . GERD (gastroesophageal reflux disease)   . H/O: GI bleed   . Hiatal hernia   . Hyperlipidemia   . Hypertension   . Myocardial infarction [facility])    1978 & 2011  . Peripheral vascular disease, unspecified ([place])   . Renal artery stenosis ([place])   . Stroke [facility])    Past Surgical History:  Procedure Laterality Date  . Aortobifemoral BPG  07/22/10   and Right femoral-popliteal BPG   Current Outpatient Medications on File Prior to Visit  Medication Sig Dispense Refill  . ALPRAZolam (XANAX) 0.5 MG tablet TAKE 1 TABLET BY MOUTH AT BEDTIME 30 tablet 2  . atorvastatin (LIPITOR) 20 MG tablet Take 1 tablet (20 mg total) by mouth at bedtime. Needs office visit and labs before further  refills 90 tablet 0  . famotidine (PEPCID) 20 MG tablet Take 20 mg at bedtime by mouth.     . metoprolol succinate (TOPROL-XL) 25 MG 24 hr tablet TAKE ONE TABLET BY MOUTH ONCE DAILY 90 tablet 3  . NITROSTAT 0.4 MG SL tablet DISSOLVE ONE TABLET UNDER THE TONGUE AS DIRECTED 25 tablet 0   No current facility-administered medications on file prior to visit.    Allergies  Allergen Reactions  . Penicillins Rash   Social History   Socioeconomic History  . Marital status: Widowed    Spouse name: Not on file  . Number of children: Not on file  . Years of education: Not on file  . Highest education level: Not on file  Social Needs  . Financial resource strain: Not on file  . Food insecurity - worry: Not on file  . Food insecurity - inability: Not on file  . Transportation needs - medical: Not on file  . Transportation needs - non-medical: Not on file  Occupational History  . Not on file  Tobacco Use  . Smoking status: Former Smoker    Years: 65.00    Types: Cigarettes    Last attempt to quit: 07/08/2009    Years since quitting: 7.8  . Smokeless tobacco: Never Used  Substance and Sexual Activity  . Alcohol use: No  . Drug use: No  . Sexual activity: Not on file  Other Topics Concern  . Not on file  Social History Narrative  . Not on file     Review of Systems  All other systems reviewed and are negative.      Objective:   Physical Exam  Constitutional: He appears well-developed and well-nourished.  Cardiovascular: Normal rate, regular rhythm and normal heart sounds.  Pulmonary/Chest: Effort normal and breath sounds normal.  Musculoskeletal:       Left forearm: He exhibits deformity.       Arms: Vitals reviewed.         Assessment & Plan:  SCC (squamous cell carcinoma), arm, left - Plan: Pathology  Area was anesthetized with 0.1% lidocaine with epinephrine.  Patient was prepped and draped in sterile fashion.  A 3.5 cm x 1.5 cm elliptical excision was performed  around the previous mass including 2-4 mm of visibly clear margin on all sides.  The lesion was excised in its entirety down to the subcutaneous fat.  Skin edges were then approximated using 6, simple interrupted 3-0 Ethilon sutures.  Lesion was sent to pathology in a labeled container to ensure clear margins.  Stitches out in 7 days.

## 2017-05-05 LAB — TISSUE SPECIMEN

## 2017-05-05 LAB — PATHOLOGY

## 2017-05-08 ENCOUNTER — Ambulatory Visit (INDEPENDENT_AMBULATORY_CARE_PROVIDER_SITE_OTHER): Payer: Medicare Other | Admitting: Family Medicine

## 2017-05-08 VITALS — BP 138/74 | HR 70 | Temp 98.3°F | Resp 16 | Ht 66.0 in | Wt 146.0 lb

## 2017-05-08 DIAGNOSIS — Z4802 Encounter for removal of sutures: Secondary | ICD-10-CM

## 2017-05-08 NOTE — Progress Notes (Signed)
Subjective:    Patient ID: Martin [name], male    DOB: 1930-05-20, 84 y.o.   MRN: [id number]  HPI 04/23/17 Patient reports a one-month history of a growing mass on the dorsum of his left forearm.  It is approximately 1.7 cm in diameter.  It is a dome-shaped pink nodule with multiple telangiectasias streaking across the surface.  It is firm to palpation.  There is no fluctuance.  Patient states it bleeds if manipulated.  At that time, my plan was: Lesion appears to be a nodular basal cell carcinoma versus a keratoacanthoma versus a squamous cell carcinoma.  Recommended a biopsy.  Lesion was anesthetized with 0.1% lidocaine without epinephrine.  Shave biopsy was performed down to the underlying dermis of the entire lesion.  Hemostasis was achieved with Drysol, and a pressure dressing.  Wound care was discussed.  Lesion was sent to pathology in a labeled container.  I anticipate that he will need wide excision but I will await the biopsy results first.  05/01/17 Biopsy revealed squamous cell carcinoma deep margin was involved.  Patient is here today for wider excision.  At that time, my plan was: Area was anesthetized with 0.1% lidocaine with epinephrine.  Patient was prepped and draped in sterile fashion.  A 3.5 cm x 1.5 cm elliptical excision was performed around the previous mass including 2-4 mm of visibly clear margin on all sides.  The lesion was excised in its entirety down to the subcutaneous fat.  Skin edges were then approximated using 6, simple interrupted 3-0 Ethilon sutures.  Lesion was sent to pathology in a labeled container to ensure clear margins.  Stitches out in 7 days.  05/08/17 Patient is here today for suture removal.  Biopsy confirmed clear margins.  Cancer has been removed in its entirety Past Medical History:  Diagnosis Date  . Arthritis   . Bronchitis   . COPD (chronic obstructive pulmonary disease) ([place])   . Diabetes mellitus   . GERD (gastroesophageal reflux disease)   .  H/O: GI bleed   . Hiatal hernia   . Hyperlipidemia   . Hypertension   . Myocardial infarction [facility])    1978 & 2011  . Peripheral vascular disease, unspecified ([place])   . Renal artery stenosis ([place])   . Stroke [facility])    Past Surgical History:  Procedure Laterality Date  . Aortobifemoral BPG  07/22/10   and Right femoral-popliteal BPG   Current Outpatient Medications on File Prior to Visit  Medication Sig Dispense Refill  . ALPRAZolam (XANAX) 0.5 MG tablet TAKE 1 TABLET BY MOUTH AT BEDTIME 30 tablet 2  . atorvastatin (LIPITOR) 20 MG tablet Take 1 tablet (20 mg total) by mouth at bedtime. Needs office visit and labs before further refills 90 tablet 0  . clopidogrel (PLAVIX) 75 MG tablet Take 1 tablet (75 mg total) by mouth daily. 90 tablet 3  . famotidine (PEPCID) 20 MG tablet Take 20 mg at bedtime by mouth.     . metoprolol succinate (TOPROL-XL) 25 MG 24 hr tablet TAKE ONE TABLET BY MOUTH ONCE DAILY 90 tablet 3  . NITROSTAT 0.4 MG SL tablet DISSOLVE ONE TABLET UNDER THE TONGUE AS DIRECTED 25 tablet 0   No current facility-administered medications on file prior to visit.    Allergies  Allergen Reactions  . Penicillins Rash   Social History   Socioeconomic History  . Marital status: Widowed    Spouse name: Not on file  . Number of children: Not  on file  . Years of education: Not on file  . Highest education level: Not on file  Social Needs  . Financial resource strain: Not on file  . Food insecurity - worry: Not on file  . Food insecurity - inability: Not on file  . Transportation needs - medical: Not on file  . Transportation needs - non-medical: Not on file  Occupational History  . Not on file  Tobacco Use  . Smoking status: Former Smoker    Years: 65.00    Types: Cigarettes    Last attempt to quit: 07/08/2009    Years since quitting: 7.8  . Smokeless tobacco: Never Used  Substance and Sexual Activity  . Alcohol use: No  . Drug use: No  . Sexual activity: Not on file   Other Topics Concern  . Not on file  Social History Narrative  . Not on file     Review of Systems  All other systems reviewed and are negative.      Objective:   Physical Exam  Constitutional: He appears well-developed and well-nourished.  Cardiovascular: Normal rate, regular rhythm and normal heart sounds.  Pulmonary/Chest: Effort normal and breath sounds normal.  Musculoskeletal:       Left forearm: He exhibits deformity.       Arms: Vitals reviewed.         Assessment & Plan:  Visit for suture removal 6 sutures removed without difficulty.  Wound is reinforced with Steri-Strips.  Follow-up as needed

## 2017-07-07 ENCOUNTER — Other Ambulatory Visit: Payer: Self-pay | Admitting: Family Medicine

## 2017-07-07 NOTE — Telephone Encounter (Signed)
Ok to refill??  Last office visit 05/08/2017.  Last refill 04/10/2017, #2 refills.

## 2017-07-26 ENCOUNTER — Other Ambulatory Visit: Payer: Self-pay | Admitting: Family Medicine

## 2017-07-27 DIAGNOSIS — H02532 Eyelid retraction right lower eyelid: Secondary | ICD-10-CM | POA: Diagnosis not present

## 2017-07-27 DIAGNOSIS — H04561 Stenosis of right lacrimal punctum: Secondary | ICD-10-CM | POA: Diagnosis not present

## 2017-07-27 DIAGNOSIS — H11821 Conjunctivochalasis, right eye: Secondary | ICD-10-CM | POA: Diagnosis not present

## 2017-07-27 DIAGNOSIS — H04223 Epiphora due to insufficient drainage, bilateral lacrimal glands: Secondary | ICD-10-CM | POA: Diagnosis not present

## 2017-07-27 DIAGNOSIS — H05221 Edema of right orbit: Secondary | ICD-10-CM | POA: Diagnosis not present

## 2017-07-27 DIAGNOSIS — H02112 Cicatricial ectropion of right lower eyelid: Secondary | ICD-10-CM | POA: Diagnosis not present

## 2017-07-27 DIAGNOSIS — H04521 Eversion of right lacrimal punctum: Secondary | ICD-10-CM | POA: Diagnosis not present

## 2017-08-19 DIAGNOSIS — H04521 Eversion of right lacrimal punctum: Secondary | ICD-10-CM | POA: Diagnosis not present

## 2017-08-19 DIAGNOSIS — H05221 Edema of right orbit: Secondary | ICD-10-CM | POA: Diagnosis not present

## 2017-08-19 DIAGNOSIS — H11821 Conjunctivochalasis, right eye: Secondary | ICD-10-CM | POA: Diagnosis not present

## 2017-08-19 DIAGNOSIS — H04223 Epiphora due to insufficient drainage, bilateral lacrimal glands: Secondary | ICD-10-CM | POA: Diagnosis not present

## 2017-08-19 DIAGNOSIS — H02132 Senile ectropion of right lower eyelid: Secondary | ICD-10-CM | POA: Diagnosis not present

## 2017-08-19 DIAGNOSIS — H02112 Cicatricial ectropion of right lower eyelid: Secondary | ICD-10-CM | POA: Diagnosis not present

## 2017-08-19 DIAGNOSIS — H04561 Stenosis of right lacrimal punctum: Secondary | ICD-10-CM | POA: Diagnosis not present

## 2017-08-19 DIAGNOSIS — H02532 Eyelid retraction right lower eyelid: Secondary | ICD-10-CM | POA: Diagnosis not present

## 2017-09-24 ENCOUNTER — Other Ambulatory Visit: Payer: Self-pay | Admitting: Family Medicine

## 2017-10-05 ENCOUNTER — Other Ambulatory Visit: Payer: Self-pay | Admitting: Family Medicine

## 2017-10-05 NOTE — Telephone Encounter (Signed)
Ok to refill??  Last office visit 05/08/2017.  Last refill 07/09/2017, #2 refills.

## 2017-10-22 ENCOUNTER — Encounter: Payer: Medicare Other | Admitting: Family Medicine

## 2017-11-19 DIAGNOSIS — H02423 Myogenic ptosis of bilateral eyelids: Secondary | ICD-10-CM | POA: Diagnosis not present

## 2017-11-19 DIAGNOSIS — H0279 Other degenerative disorders of eyelid and periocular area: Secondary | ICD-10-CM | POA: Diagnosis not present

## 2017-11-19 DIAGNOSIS — H53483 Generalized contraction of visual field, bilateral: Secondary | ICD-10-CM | POA: Diagnosis not present

## 2017-11-19 DIAGNOSIS — H02839 Dermatochalasis of unspecified eye, unspecified eyelid: Secondary | ICD-10-CM | POA: Diagnosis not present

## 2017-11-19 DIAGNOSIS — Z09 Encounter for follow-up examination after completed treatment for conditions other than malignant neoplasm: Secondary | ICD-10-CM | POA: Diagnosis not present

## 2017-11-19 DIAGNOSIS — H02413 Mechanical ptosis of bilateral eyelids: Secondary | ICD-10-CM | POA: Diagnosis not present

## 2017-11-19 DIAGNOSIS — H57813 Brow ptosis, bilateral: Secondary | ICD-10-CM | POA: Diagnosis not present

## 2017-12-14 ENCOUNTER — Ambulatory Visit (INDEPENDENT_AMBULATORY_CARE_PROVIDER_SITE_OTHER): Payer: Medicare Other | Admitting: *Deleted

## 2017-12-14 DIAGNOSIS — Z23 Encounter for immunization: Secondary | ICD-10-CM | POA: Diagnosis not present

## 2017-12-23 ENCOUNTER — Telehealth: Payer: Self-pay | Admitting: *Deleted

## 2017-12-23 NOTE — Telephone Encounter (Signed)
Spoke with pt. About 1 year f/u he stated that its no longer needed everything is going ok. He also stated that he spoke with Dr. Oneida Alar and he was told to call when anything is needed. 12/23/17 LL (recall appt. No longer needed)

## 2018-01-04 ENCOUNTER — Other Ambulatory Visit: Payer: Self-pay | Admitting: Family Medicine

## 2018-01-05 NOTE — Telephone Encounter (Signed)
Due for ov.  

## 2018-01-05 NOTE — Telephone Encounter (Signed)
Last OV 04/23/2017 Last refill 10/05/2017 Ok to refill?

## 2018-01-22 ENCOUNTER — Other Ambulatory Visit: Payer: Self-pay

## 2018-01-22 DIAGNOSIS — C44319 Basal cell carcinoma of skin of other parts of face: Secondary | ICD-10-CM | POA: Diagnosis not present

## 2018-02-26 DIAGNOSIS — C4491 Basal cell carcinoma of skin, unspecified: Secondary | ICD-10-CM | POA: Diagnosis not present

## 2018-02-26 DIAGNOSIS — L570 Actinic keratosis: Secondary | ICD-10-CM | POA: Diagnosis not present

## 2018-03-15 ENCOUNTER — Inpatient Hospital Stay (HOSPITAL_COMMUNITY)
Admission: EM | Admit: 2018-03-15 | Discharge: 2018-03-19 | DRG: 167 | Disposition: A | Discharge status: Home Health | Payer: Medicare Other | Attending: Family Medicine | Admitting: Family Medicine

## 2018-03-15 ENCOUNTER — Encounter (HOSPITAL_COMMUNITY): Payer: Self-pay

## 2018-03-15 DIAGNOSIS — J479 Bronchiectasis, uncomplicated: Secondary | ICD-10-CM | POA: Diagnosis not present

## 2018-03-15 DIAGNOSIS — I129 Hypertensive chronic kidney disease with stage 1 through stage 4 chronic kidney disease, or unspecified chronic kidney disease: Secondary | ICD-10-CM | POA: Diagnosis not present

## 2018-03-15 DIAGNOSIS — I252 Old myocardial infarction: Secondary | ICD-10-CM

## 2018-03-15 DIAGNOSIS — I70219 Atherosclerosis of native arteries of extremities with intermittent claudication, unspecified extremity: Secondary | ICD-10-CM | POA: Diagnosis present

## 2018-03-15 DIAGNOSIS — K5730 Diverticulosis of large intestine without perforation or abscess without bleeding: Secondary | ICD-10-CM | POA: Diagnosis not present

## 2018-03-15 DIAGNOSIS — M5440 Lumbago with sciatica, unspecified side: Secondary | ICD-10-CM | POA: Diagnosis present

## 2018-03-15 DIAGNOSIS — R9431 Abnormal electrocardiogram [ECG] [EKG]: Secondary | ICD-10-CM | POA: Diagnosis not present

## 2018-03-15 DIAGNOSIS — Z880 Allergy status to penicillin: Secondary | ICD-10-CM

## 2018-03-15 DIAGNOSIS — R918 Other nonspecific abnormal finding of lung field: Secondary | ICD-10-CM

## 2018-03-15 DIAGNOSIS — N183 Chronic kidney disease, stage 3 unspecified: Secondary | ICD-10-CM | POA: Diagnosis present

## 2018-03-15 DIAGNOSIS — E119 Type 2 diabetes mellitus without complications: Secondary | ICD-10-CM

## 2018-03-15 DIAGNOSIS — C7951 Secondary malignant neoplasm of bone: Secondary | ICD-10-CM | POA: Diagnosis not present

## 2018-03-15 DIAGNOSIS — K8020 Calculus of gallbladder without cholecystitis without obstruction: Secondary | ICD-10-CM | POA: Diagnosis not present

## 2018-03-15 DIAGNOSIS — E1151 Type 2 diabetes mellitus with diabetic peripheral angiopathy without gangrene: Secondary | ICD-10-CM | POA: Diagnosis present

## 2018-03-15 DIAGNOSIS — K5900 Constipation, unspecified: Secondary | ICD-10-CM | POA: Diagnosis present

## 2018-03-15 DIAGNOSIS — C3411 Malignant neoplasm of upper lobe, right bronchus or lung: Secondary | ICD-10-CM | POA: Diagnosis not present

## 2018-03-15 DIAGNOSIS — Z8249 Family history of ischemic heart disease and other diseases of the circulatory system: Secondary | ICD-10-CM

## 2018-03-15 DIAGNOSIS — Z800 Family history of malignant neoplasm of digestive organs: Secondary | ICD-10-CM

## 2018-03-15 DIAGNOSIS — Z8673 Personal history of transient ischemic attack (TIA), and cerebral infarction without residual deficits: Secondary | ICD-10-CM

## 2018-03-15 DIAGNOSIS — Z87891 Personal history of nicotine dependence: Secondary | ICD-10-CM

## 2018-03-15 DIAGNOSIS — E785 Hyperlipidemia, unspecified: Secondary | ICD-10-CM | POA: Diagnosis present

## 2018-03-15 DIAGNOSIS — E1122 Type 2 diabetes mellitus with diabetic chronic kidney disease: Secondary | ICD-10-CM | POA: Diagnosis present

## 2018-03-15 DIAGNOSIS — R222 Localized swelling, mass and lump, trunk: Secondary | ICD-10-CM

## 2018-03-15 DIAGNOSIS — J449 Chronic obstructive pulmonary disease, unspecified: Secondary | ICD-10-CM | POA: Diagnosis not present

## 2018-03-15 DIAGNOSIS — M533 Sacrococcygeal disorders, not elsewhere classified: Secondary | ICD-10-CM | POA: Diagnosis not present

## 2018-03-15 DIAGNOSIS — J984 Other disorders of lung: Secondary | ICD-10-CM | POA: Diagnosis not present

## 2018-03-15 DIAGNOSIS — Z79899 Other long term (current) drug therapy: Secondary | ICD-10-CM

## 2018-03-15 DIAGNOSIS — I70212 Atherosclerosis of native arteries of extremities with intermittent claudication, left leg: Secondary | ICD-10-CM | POA: Diagnosis present

## 2018-03-15 DIAGNOSIS — C799 Secondary malignant neoplasm of unspecified site: Secondary | ICD-10-CM

## 2018-03-15 DIAGNOSIS — I701 Atherosclerosis of renal artery: Secondary | ICD-10-CM | POA: Diagnosis present

## 2018-03-15 DIAGNOSIS — K449 Diaphragmatic hernia without obstruction or gangrene: Secondary | ICD-10-CM | POA: Diagnosis present

## 2018-03-15 DIAGNOSIS — I10 Essential (primary) hypertension: Secondary | ICD-10-CM | POA: Diagnosis present

## 2018-03-15 DIAGNOSIS — F411 Generalized anxiety disorder: Secondary | ICD-10-CM | POA: Diagnosis present

## 2018-03-15 DIAGNOSIS — J441 Chronic obstructive pulmonary disease with (acute) exacerbation: Secondary | ICD-10-CM | POA: Diagnosis present

## 2018-03-15 DIAGNOSIS — M899 Disorder of bone, unspecified: Secondary | ICD-10-CM

## 2018-03-15 DIAGNOSIS — M546 Pain in thoracic spine: Secondary | ICD-10-CM | POA: Diagnosis not present

## 2018-03-15 DIAGNOSIS — Z955 Presence of coronary angioplasty implant and graft: Secondary | ICD-10-CM

## 2018-03-15 DIAGNOSIS — M5489 Other dorsalgia: Secondary | ICD-10-CM

## 2018-03-15 DIAGNOSIS — Z7902 Long term (current) use of antithrombotics/antiplatelets: Secondary | ICD-10-CM

## 2018-03-15 DIAGNOSIS — K219 Gastro-esophageal reflux disease without esophagitis: Secondary | ICD-10-CM | POA: Diagnosis present

## 2018-03-15 DIAGNOSIS — Z7951 Long term (current) use of inhaled steroids: Secondary | ICD-10-CM

## 2018-03-15 DIAGNOSIS — C3490 Malignant neoplasm of unspecified part of unspecified bronchus or lung: Secondary | ICD-10-CM | POA: Diagnosis present

## 2018-03-15 DIAGNOSIS — I2510 Atherosclerotic heart disease of native coronary artery without angina pectoris: Secondary | ICD-10-CM | POA: Diagnosis present

## 2018-03-15 DIAGNOSIS — M545 Low back pain: Secondary | ICD-10-CM | POA: Diagnosis not present

## 2018-03-15 HISTORY — DX: Chronic kidney disease, stage 3 (moderate): N18.3

## 2018-03-15 HISTORY — DX: Chronic kidney disease, stage 3 unspecified: N18.30

## 2018-03-15 LAB — URINALYSIS, ROUTINE W REFLEX MICROSCOPIC
BACTERIA UA: NONE SEEN
Bilirubin Urine: NEGATIVE
Glucose, UA: 50 mg/dL — AB
Hgb urine dipstick: NEGATIVE
Ketones, ur: NEGATIVE mg/dL
Leukocytes, UA: NEGATIVE
Nitrite: NEGATIVE
PROTEIN: 100 mg/dL — AB
Specific Gravity, Urine: 1.013 (ref 1.005–1.030)
pH: 6 (ref 5.0–8.0)

## 2018-03-15 NOTE — ED Triage Notes (Signed)
Pt arrives POV for eval of R sided back pain, R leg knee/shin pain x1.5 month. Hx of bypass w/ harvesting from RLE. States he was supposed to f/u w/ v ascular in nov but was not having pain at that time, so he did not. +DP pulse to R foot, foot feels WWP w/ no new numbness/tingling. Known 55% blood flow to RLE on last imaging per pt

## 2018-03-16 ENCOUNTER — Encounter (HOSPITAL_COMMUNITY): Payer: Self-pay | Admitting: Internal Medicine

## 2018-03-16 ENCOUNTER — Emergency Department (HOSPITAL_COMMUNITY): Payer: Medicare Other

## 2018-03-16 ENCOUNTER — Other Ambulatory Visit: Payer: Self-pay

## 2018-03-16 DIAGNOSIS — Z8673 Personal history of transient ischemic attack (TIA), and cerebral infarction without residual deficits: Secondary | ICD-10-CM | POA: Diagnosis not present

## 2018-03-16 DIAGNOSIS — F411 Generalized anxiety disorder: Secondary | ICD-10-CM | POA: Diagnosis present

## 2018-03-16 DIAGNOSIS — I701 Atherosclerosis of renal artery: Secondary | ICD-10-CM | POA: Diagnosis present

## 2018-03-16 DIAGNOSIS — E785 Hyperlipidemia, unspecified: Secondary | ICD-10-CM | POA: Diagnosis present

## 2018-03-16 DIAGNOSIS — C3491 Malignant neoplasm of unspecified part of right bronchus or lung: Secondary | ICD-10-CM

## 2018-03-16 DIAGNOSIS — I129 Hypertensive chronic kidney disease with stage 1 through stage 4 chronic kidney disease, or unspecified chronic kidney disease: Secondary | ICD-10-CM | POA: Diagnosis present

## 2018-03-16 DIAGNOSIS — C801 Malignant (primary) neoplasm, unspecified: Secondary | ICD-10-CM | POA: Diagnosis not present

## 2018-03-16 DIAGNOSIS — E1151 Type 2 diabetes mellitus with diabetic peripheral angiopathy without gangrene: Secondary | ICD-10-CM | POA: Diagnosis present

## 2018-03-16 DIAGNOSIS — I252 Old myocardial infarction: Secondary | ICD-10-CM | POA: Diagnosis not present

## 2018-03-16 DIAGNOSIS — J449 Chronic obstructive pulmonary disease, unspecified: Secondary | ICD-10-CM | POA: Diagnosis present

## 2018-03-16 DIAGNOSIS — J984 Other disorders of lung: Secondary | ICD-10-CM | POA: Diagnosis not present

## 2018-03-16 DIAGNOSIS — I10 Essential (primary) hypertension: Secondary | ICD-10-CM

## 2018-03-16 DIAGNOSIS — K5900 Constipation, unspecified: Secondary | ICD-10-CM | POA: Diagnosis present

## 2018-03-16 DIAGNOSIS — Z880 Allergy status to penicillin: Secondary | ICD-10-CM | POA: Diagnosis not present

## 2018-03-16 DIAGNOSIS — J479 Bronchiectasis, uncomplicated: Secondary | ICD-10-CM

## 2018-03-16 DIAGNOSIS — K449 Diaphragmatic hernia without obstruction or gangrene: Secondary | ICD-10-CM | POA: Diagnosis present

## 2018-03-16 DIAGNOSIS — C3411 Malignant neoplasm of upper lobe, right bronchus or lung: Secondary | ICD-10-CM | POA: Diagnosis present

## 2018-03-16 DIAGNOSIS — Z800 Family history of malignant neoplasm of digestive organs: Secondary | ICD-10-CM | POA: Diagnosis not present

## 2018-03-16 DIAGNOSIS — I70212 Atherosclerosis of native arteries of extremities with intermittent claudication, left leg: Secondary | ICD-10-CM | POA: Diagnosis present

## 2018-03-16 DIAGNOSIS — C3490 Malignant neoplasm of unspecified part of unspecified bronchus or lung: Secondary | ICD-10-CM | POA: Diagnosis present

## 2018-03-16 DIAGNOSIS — M8588 Other specified disorders of bone density and structure, other site: Secondary | ICD-10-CM | POA: Diagnosis not present

## 2018-03-16 DIAGNOSIS — Z87891 Personal history of nicotine dependence: Secondary | ICD-10-CM | POA: Diagnosis not present

## 2018-03-16 DIAGNOSIS — E1122 Type 2 diabetes mellitus with diabetic chronic kidney disease: Secondary | ICD-10-CM | POA: Diagnosis present

## 2018-03-16 DIAGNOSIS — Z79899 Other long term (current) drug therapy: Secondary | ICD-10-CM | POA: Diagnosis not present

## 2018-03-16 DIAGNOSIS — I70219 Atherosclerosis of native arteries of extremities with intermittent claudication, unspecified extremity: Secondary | ICD-10-CM

## 2018-03-16 DIAGNOSIS — N183 Chronic kidney disease, stage 3 (moderate): Secondary | ICD-10-CM | POA: Diagnosis present

## 2018-03-16 DIAGNOSIS — I2510 Atherosclerotic heart disease of native coronary artery without angina pectoris: Secondary | ICD-10-CM | POA: Diagnosis present

## 2018-03-16 DIAGNOSIS — R918 Other nonspecific abnormal finding of lung field: Secondary | ICD-10-CM | POA: Diagnosis not present

## 2018-03-16 DIAGNOSIS — D381 Neoplasm of uncertain behavior of trachea, bronchus and lung: Secondary | ICD-10-CM | POA: Diagnosis not present

## 2018-03-16 DIAGNOSIS — C7951 Secondary malignant neoplasm of bone: Secondary | ICD-10-CM | POA: Diagnosis present

## 2018-03-16 DIAGNOSIS — K219 Gastro-esophageal reflux disease without esophagitis: Secondary | ICD-10-CM | POA: Diagnosis present

## 2018-03-16 DIAGNOSIS — K5730 Diverticulosis of large intestine without perforation or abscess without bleeding: Secondary | ICD-10-CM | POA: Diagnosis not present

## 2018-03-16 DIAGNOSIS — N189 Chronic kidney disease, unspecified: Secondary | ICD-10-CM | POA: Diagnosis not present

## 2018-03-16 DIAGNOSIS — M5440 Lumbago with sciatica, unspecified side: Secondary | ICD-10-CM | POA: Diagnosis present

## 2018-03-16 DIAGNOSIS — Z7902 Long term (current) use of antithrombotics/antiplatelets: Secondary | ICD-10-CM | POA: Diagnosis not present

## 2018-03-16 DIAGNOSIS — N179 Acute kidney failure, unspecified: Secondary | ICD-10-CM | POA: Diagnosis not present

## 2018-03-16 DIAGNOSIS — K8020 Calculus of gallbladder without cholecystitis without obstruction: Secondary | ICD-10-CM | POA: Diagnosis not present

## 2018-03-16 DIAGNOSIS — G893 Neoplasm related pain (acute) (chronic): Secondary | ICD-10-CM | POA: Diagnosis not present

## 2018-03-16 DIAGNOSIS — Z7951 Long term (current) use of inhaled steroids: Secondary | ICD-10-CM | POA: Diagnosis not present

## 2018-03-16 DIAGNOSIS — M545 Low back pain: Secondary | ICD-10-CM | POA: Diagnosis not present

## 2018-03-16 LAB — CBC WITH DIFFERENTIAL/PLATELET
Abs Immature Granulocytes: 0.04 10*3/uL (ref 0.00–0.07)
BASOS ABS: 0 10*3/uL (ref 0.0–0.1)
Basophils Relative: 0 %
Eosinophils Absolute: 0.2 10*3/uL (ref 0.0–0.5)
Eosinophils Relative: 2 %
HCT: 43.5 % (ref 39.0–52.0)
Hemoglobin: 14.1 g/dL (ref 13.0–17.0)
Immature Granulocytes: 0 %
Lymphocytes Relative: 19 %
Lymphs Abs: 1.8 10*3/uL (ref 0.7–4.0)
MCH: 32.1 pg (ref 26.0–34.0)
MCHC: 32.4 g/dL (ref 30.0–36.0)
MCV: 99.1 fL (ref 80.0–100.0)
Monocytes Absolute: 0.9 10*3/uL (ref 0.1–1.0)
Monocytes Relative: 10 %
Neutro Abs: 6.5 10*3/uL (ref 1.7–7.7)
Neutrophils Relative %: 69 %
Platelets: 191 10*3/uL (ref 150–400)
RBC: 4.39 MIL/uL (ref 4.22–5.81)
RDW: 12.3 % (ref 11.5–15.5)
WBC: 9.5 10*3/uL (ref 4.0–10.5)
nRBC: 0 % (ref 0.0–0.2)

## 2018-03-16 LAB — BASIC METABOLIC PANEL
Anion gap: 12 (ref 5–15)
BUN: 26 mg/dL — ABNORMAL HIGH (ref 8–23)
CO2: 26 mmol/L (ref 22–32)
Calcium: 9.4 mg/dL (ref 8.9–10.3)
Chloride: 101 mmol/L (ref 98–111)
Creatinine, Ser: 1.49 mg/dL — ABNORMAL HIGH (ref 0.61–1.24)
GFR calc non Af Amer: 42 mL/min — ABNORMAL LOW (ref >60)
GFR, EST AFRICAN AMERICAN: 49 mL/min — AB (ref >60)
Glucose, Bld: 131 mg/dL — ABNORMAL HIGH (ref 70–99)
Potassium: 4 mmol/L (ref 3.5–5.1)
Sodium: 139 mmol/L (ref 135–145)

## 2018-03-16 MED ORDER — ACETAMINOPHEN 650 MG RE SUPP: 650.0000 mg | Freq: Four times a day (QID) | RECTAL | Status: DC | PRN

## 2018-03-16 MED ORDER — ONDANSETRON HCL 4 MG PO TABS
4.0000 mg | ORAL_TABLET | Freq: Four times a day (QID) | ORAL | Status: DC | PRN
  Administered 2018-03-18: 4 mg via ORAL
  Filled 2018-03-16: qty 1

## 2018-03-16 MED ORDER — LIDOCAINE 5 % EX PTCH
1.0000 | MEDICATED_PATCH | CUTANEOUS | Status: DC
  Administered 2018-03-16 – 2018-03-19 (×4): 1 via TRANSDERMAL
  Filled 2018-03-16 (×4): qty 1

## 2018-03-16 MED ORDER — ONDANSETRON HCL 4 MG/2ML IJ SOLN: 4.0000 mg | Freq: Four times a day (QID) | INTRAMUSCULAR | Status: DC | PRN

## 2018-03-16 MED ORDER — FAMOTIDINE 20 MG PO TABS
20.0000 mg | ORAL_TABLET | Freq: Every day | ORAL | Status: DC
  Administered 2018-03-16 – 2018-03-18 (×3): 20 mg via ORAL
  Filled 2018-03-16 (×3): qty 1

## 2018-03-16 MED ORDER — ALBUTEROL SULFATE (2.5 MG/3ML) 0.083% IN NEBU: 2.5000 mg | INHALATION_SOLUTION | RESPIRATORY_TRACT | Status: DC | PRN

## 2018-03-16 MED ORDER — HYDROCODONE-ACETAMINOPHEN 5-325 MG PO TABS
1.0000 | ORAL_TABLET | Freq: Once | ORAL | Status: AC
  Administered 2018-03-16: 1 via ORAL
  Filled 2018-03-16: qty 1

## 2018-03-16 MED ORDER — IOPAMIDOL (ISOVUE-370) INJECTION 76%
INTRAVENOUS | Status: AC
  Filled 2018-03-16: qty 100

## 2018-03-16 MED ORDER — FENTANYL CITRATE (PF) 100 MCG/2ML IJ SOLN
50.0000 ug | Freq: Once | INTRAMUSCULAR | Status: AC
  Administered 2018-03-16: 50 ug via INTRAVENOUS
  Filled 2018-03-16: qty 2

## 2018-03-16 MED ORDER — HYDROCODONE-ACETAMINOPHEN 5-325 MG PO TABS
1.0000 | ORAL_TABLET | ORAL | Status: DC | PRN
  Administered 2018-03-16 – 2018-03-17 (×4): 1 via ORAL
  Administered 2018-03-17 (×2): 2 via ORAL
  Administered 2018-03-17: 1 via ORAL
  Administered 2018-03-18: 2 via ORAL
  Administered 2018-03-18: 1 via ORAL
  Administered 2018-03-18 – 2018-03-19 (×4): 2 via ORAL
  Filled 2018-03-16: qty 1
  Filled 2018-03-16 (×4): qty 2
  Filled 2018-03-16 (×2): qty 1
  Filled 2018-03-16: qty 2
  Filled 2018-03-16: qty 1
  Filled 2018-03-16 (×3): qty 2
  Filled 2018-03-16: qty 1

## 2018-03-16 MED ORDER — ALPRAZOLAM 0.5 MG PO TABS
0.5000 mg | ORAL_TABLET | Freq: Every day | ORAL | Status: DC
  Administered 2018-03-16 – 2018-03-18 (×3): 0.5 mg via ORAL
  Filled 2018-03-16 (×3): qty 1

## 2018-03-16 MED ORDER — ONDANSETRON HCL 4 MG/2ML IJ SOLN
4.0000 mg | Freq: Once | INTRAMUSCULAR | Status: AC
  Administered 2018-03-16: 4 mg via INTRAVENOUS
  Filled 2018-03-16: qty 2

## 2018-03-16 MED ORDER — IBUPROFEN 400 MG PO TABS
400.0000 mg | ORAL_TABLET | Freq: Once | ORAL | Status: AC | PRN
  Administered 2018-03-16: 400 mg via ORAL
  Filled 2018-03-16: qty 1

## 2018-03-16 MED ORDER — METOPROLOL SUCCINATE ER 25 MG PO TB24
25.0000 mg | ORAL_TABLET | Freq: Every day | ORAL | Status: DC
  Administered 2018-03-16 – 2018-03-18 (×3): 25 mg via ORAL
  Filled 2018-03-16 (×4): qty 1

## 2018-03-16 MED ORDER — ACETAMINOPHEN 325 MG PO TABS: 650.0000 mg | ORAL_TABLET | Freq: Four times a day (QID) | ORAL | Status: DC | PRN

## 2018-03-16 MED ORDER — ENOXAPARIN SODIUM 40 MG/0.4ML ~~LOC~~ SOLN
40.0000 mg | SUBCUTANEOUS | Status: DC
  Administered 2018-03-16 – 2018-03-18 (×3): 40 mg via SUBCUTANEOUS
  Filled 2018-03-16 (×4): qty 0.4

## 2018-03-16 MED ORDER — DOCUSATE SODIUM 100 MG PO CAPS
100.0000 mg | ORAL_CAPSULE | Freq: Two times a day (BID) | ORAL | Status: DC
  Administered 2018-03-16 – 2018-03-19 (×7): 100 mg via ORAL
  Filled 2018-03-16 (×7): qty 1

## 2018-03-16 MED ORDER — IOPAMIDOL (ISOVUE-370) INJECTION 76%
100.0000 mL | Freq: Once | INTRAVENOUS | Status: AC | PRN
  Administered 2018-03-16: 100 mL via INTRAVENOUS

## 2018-03-16 MED ORDER — IOPAMIDOL (ISOVUE-370) INJECTION 76%
INTRAVENOUS | Status: AC
  Filled 2018-03-16: qty 50

## 2018-03-16 MED ORDER — ATORVASTATIN CALCIUM 10 MG PO TABS
20.0000 mg | ORAL_TABLET | Freq: Every day | ORAL | Status: DC
  Administered 2018-03-16 – 2018-03-18 (×3): 20 mg via ORAL
  Filled 2018-03-16 (×3): qty 2

## 2018-03-16 NOTE — ED Provider Notes (Signed)
Care transferred to me.  The patient's CT scan unfortunately shows a likely metastasis or other lesion to his right sacrum.  This would correlate with the symptoms.  He has no weakness in the leg but likely has some abutment up against the nerve causing sciatica.  I discussed with Dr. Alvy Bimler, who recommends CT chest without contrast to help look for primary lung malignancy.  This does show a spiculated mass as below.  Discussing again with her, she asks for him to be admitted to the hospital service and pulmonology consult.  I have consulted pulmonology and Dr. Lorin Mercy will admit.  This is to help speed up his work-up/biopsies.   Results for orders placed or performed during the hospital encounter of 03/15/18  Urinalysis, Routine w reflex microscopic  Result Value Ref Range   Color, Urine YELLOW YELLOW   APPearance CLEAR CLEAR   Specific Gravity, Urine 1.013 1.005 - 1.030   pH 6.0 5.0 - 8.0   Glucose, UA 50 (A) NEGATIVE mg/dL   Hgb urine dipstick NEGATIVE NEGATIVE   Bilirubin Urine NEGATIVE NEGATIVE   Ketones, ur NEGATIVE NEGATIVE mg/dL   Protein, ur 100 (A) NEGATIVE mg/dL   Nitrite NEGATIVE NEGATIVE   Leukocytes, UA NEGATIVE NEGATIVE   RBC / HPF 0-5 0 - 5 RBC/hpf   WBC, UA 0-5 0 - 5 WBC/hpf   Bacteria, UA NONE SEEN NONE SEEN   Hyaline Casts, UA PRESENT   CBC with Differential  Result Value Ref Range   WBC 9.5 4.0 - 10.5 K/uL   RBC 4.39 4.22 - 5.81 MIL/uL   Hemoglobin 14.1 13.0 - 17.0 g/dL   HCT 43.5 39.0 - 52.0 %   MCV 99.1 80.0 - 100.0 fL   MCH 32.1 26.0 - 34.0 pg   MCHC 32.4 30.0 - 36.0 g/dL   RDW 12.3 11.5 - 15.5 %   Platelets 191 150 - 400 K/uL   nRBC 0.0 0.0 - 0.2 %   Neutrophils Relative % 69 %   Neutro Abs 6.5 1.7 - 7.7 K/uL   Lymphocytes Relative 19 %   Lymphs Abs 1.8 0.7 - 4.0 K/uL   Monocytes Relative 10 %   Monocytes Absolute 0.9 0.1 - 1.0 K/uL   Eosinophils Relative 2 %   Eosinophils Absolute 0.2 0.0 - 0.5 K/uL   Basophils Relative 0 %   Basophils Absolute 0.0  0.0 - 0.1 K/uL   Immature Granulocytes 0 %   Abs Immature Granulocytes 0.04 0.00 - 0.07 K/uL  Basic metabolic panel  Result Value Ref Range   Sodium 139 135 - 145 mmol/L   Potassium 4.0 3.5 - 5.1 mmol/L   Chloride 101 98 - 111 mmol/L   CO2 26 22 - 32 mmol/L   Glucose, Bld 131 (H) 70 - 99 mg/dL   BUN 26 (H) 8 - 23 mg/dL   Creatinine, Ser 1.49 (H) 0.61 - 1.24 mg/dL   Calcium 9.4 8.9 - 10.3 mg/dL   GFR calc non Af Amer 42 (L) >60 mL/min   GFR calc Af Amer 49 (L) >60 mL/min   Anion gap 12 5 - 15   Ct Lumbar Spine Wo Contrast  Result Date: 03/16/2018 CLINICAL DATA:  Back pain EXAM: CT LUMBAR SPINE WITHOUT CONTRAST TECHNIQUE: Multidetector CT imaging of the lumbar spine was performed without intravenous contrast administration. Multiplanar CT image reconstructions were also generated. COMPARISON:  CTA 02/19/2011 FINDINGS: Segmentation: Normal Alignment: Mild dextroscoliosis at L3-4.  Normal sagittal alignment. Vertebrae: Destructive mass lesion in the  right sacrum measuring 3.5 cm. This was not present 2012 and is most consistent with metastatic disease. No other destructive bone lesions identified. Paraspinal and other soft tissues: Atherosclerotic disease. Vascular findings reported separately from CTA today. No Peri spinal mass or adenopathy. Coronary calcification.  Lung bases clear. Disc levels: T12-L1: Negative L1-2: Negative L2-3: Mild disc bulging and mild facet degeneration. Negative for stenosis L3-4: Asymmetric advanced disc degeneration on the left with disc space narrowing and spurring. Bilateral facet degeneration. Negative for stenosis L4-5: Moderate disc and facet degeneration.  Mild spinal stenosis. L5-S1: Asymmetric facet degeneration on the right with mild subarticular stenosis on the right due to spurring. IMPRESSION: Destructive mass lesion right sacrum, probable metastatic disease. No other bone lesions Lumbar scoliosis and multilevel degenerative changes above. These results were  called by telephone at the time of interpretation on 03/16/2018 at 7:57 am to Dr. Regenia Skeeter, who verbally acknowledged these results. Electronically Signed   By: Franchot Gallo M.D.   On: 03/16/2018 07:57   Ct Angio Aortobifemoral W And/or Wo Contrast  Result Date: 03/16/2018 CLINICAL DATA:  Back pain.  Right leg pain. EXAM: CT ANGIOGRAPHY OF ABDOMINAL AORTA WITH ILIOFEMORAL RUNOFF TECHNIQUE: Multidetector CT imaging of the abdomen, pelvis and lower extremities was performed using the standard protocol during bolus administration of intravenous contrast. Multiplanar CT image reconstructions and MIPs were obtained to evaluate the vascular anatomy. CONTRAST:  153mL ISOVUE-370 IOPAMIDOL (ISOVUE-370) INJECTION 76% COMPARISON:  02/19/2011 FINDINGS: VASCULAR Aorta: Prior aorto bi femoral bypass. Negative aorta is heavily calcified. No aneurysm or dissection. Celiac: Calcified plaque in the proximal celiac artery without significant stenosis. SMA: Calcified plaque throughout the vessel, patent. Renals: Probable ostial stenoses bilaterally. IMA: Fills via collaterals. RIGHT Lower Extremity Inflow: Iliofemoral bypass is patent.  No aneurysm or dissection. Outflow: Heavily diseased/calcified common femoral artery. Runoff: Heavily calcified and diseased superficial femoral artery proximally. This occludes in the proximal to mid superficial femoral artery. There is a reported fem-pop bypass graft on the right which is not visualized, presumably occluded. Profunda is disease proximally but patent. Reconstitution of the distal popliteal artery. Proximal trifurcation vessels appear patent but very small and difficult to visualize below the mid calf. LEFT Lower Extremity Inflow: Aortofemoral bypass is patent. Outflow: Common femoral artery is diseased and calcified. Runoff: Occlusion of the left superficial femoral artery proximally. Profunda is patent. Reconstitution of the popliteal artery above the knee with moderate disease.  Proximal trifurcation vessels are visualized and patent. Difficult to visualize below the mid calf. Veins: Grossly patent. Review of the MIP images confirms the above findings. NON-VASCULAR Lower chest: No acute abnormality. Hepatobiliary: Small layering gallstones in the gallbladder. No focal hepatic abnormality. Pancreas: No focal abnormality or ductal dilatation. Spleen: No focal abnormality.  Normal size. Adrenals/Urinary Tract: Areas of decreased enhancement and cortical thinning in the lower poles of both kidneys, likely related to renovascular disease. No hydronephrosis or suspicious renal mass. Adrenal glands and urinary bladder unremarkable. Stomach/Bowel: Normal appendix. Left colonic diverticulosis. No active diverticulitis. No evidence of bowel obstruction. Lymphatic: No adenopathy. Reproductive: Prostate enlargement Other: No free fluid or free air. Musculoskeletal: There is a lytic destructive expansile lesion involving much of the right side of the sacrum measuring up to 4.2 cm on image 108. IMPRESSION: VASCULAR Prior aortobifemoral bypass which is patent. Heavily diseased and occluded superficial femoral arteries bilaterally, in the upper thigh on the left and upper to mid thigh on the right. Reconstitution of the right popliteal artery below the knee which is  heavily diseased. Proximal trifurcation vessels are patent but difficult to visualize below the mid calf. No visible patent bypass graft. Reconstitution of the left popliteal artery above the knee with moderate disease. Proximal trifurcation vessels are patent but difficult to visualize below the mid calf. NON-VASCULAR Lytic destructive lesion within the right sacrum concerning for metastasis or myeloma. Enlarged prostate. Left colonic diverticulosis.  No active diverticulitis. Layering gallstones within the gallbladder. These results were called by telephone at the time of interpretation on 03/16/2018 at 8:28 am to Dr. Regenia Skeeter , who verbally  acknowledged these results. Electronically Signed   By: Rolm Baptise M.D.   On: 03/16/2018 08:29      Sherwood Gambler, MD 03/16/18 1136

## 2018-03-16 NOTE — Progress Notes (Signed)
IR aware of request for possible image-guided lung mass biopsy.  Images reviewed by Dr. Earleen Newport who states patient has a metastatic lesion in the left chest wall and we would not biopsy the lung. Requests PET CT to confirm activity in that chest wall mass and possible outpatient biopsy. Marni Griffon, NP aware.  This order will be canceled. Please re-consult IR after PET/in future if needed.  Please call IR with questions/concerns.  Bea Graff Deward Sebek, PA-C 03/16/2018, 4:27 PM

## 2018-03-16 NOTE — ED Notes (Signed)
Pt arrived to Rm 48 via w/c - wearing personal clothing - does not want to change into gown. Family member at bedside. Aware of tx plan - being admitted.

## 2018-03-16 NOTE — Consult Note (Addendum)
NAME:  Martin [name], MRN:  [id number], DOB:  07-Sep-1930, LOS: 0 ADMISSION DATE:  03/15/2018, CONSULTATION DATE: 1/7 REFERRING MD:  Lorin Mercy, CHIEF COMPLAINT:  Lung mass   Brief History   85 year old male who presented to ER 1/7 w/ cc: back pain and inability to stand/walk. Dx eval In ER c/w newly identified metastatic appearing bone lesions including the lytic destruction  Of lateral left 4th rib, destructive lesion of right sacrum and right apical cavitary lung mass. PCCM was asked to evaluate to determine best biopsy approach  History of present illness    85 year old male w/ 1ppd smoking for about 65 years (stopped 2012), + copd,in [place] up until about 6 weeks ago when he started to develop back pain. This has progressed over the last 2 weeks much more acutely and over last three days been severe enough to make standing and walking unbearable. His only other symptom has been vague right trapezius area discomfort that has been intermittent. He presented to the ER on 1/7 as the symptoms could no longer be tolerated.  In ER: CT imaging was obtained which showed Right apical spiculated lung mass w/ area of cavitation, small scattered nodules, what appears to be lytic bone destruction of the left lateral 4th rib and destructive lesion of the sacrum. Oncology was called and recommended admission for pain control and to expedite diagnosis   Past Medical History  COPD, prior CVA (on plavix), PVD w/ RAS, CAD, DM Significant Hospital Events   1/7 admitted   Consults:  Pulm consulted 1/7  Procedures:    Significant Diagnostic Tests:  CT chest 1/7: 1. Partially cavitary irregular spiculated 4.3 x 1.6 cm apical right upper lobe lung mass. Given the suspected osseous metastases, this is suspicious for a primary bronchogenic carcinoma. Multidisciplinary thoracic oncology consultation suggested. PET-CT is suggested for further characterization and staging evaluation. 2. No thoracic adenopathy. 3.  Lytic destructive expansile lateral left fourth rib mass most compatible with metastasis. 4. Clustered small solid pulmonary nodules in the apical left upper lobe, largest 4 mm, for which follow-up chest CT is advised in 3 months. 5. Scattered mild cylindrical bronchiectasis in both lungs, most prominent in the left lower lobe. 6. Three-vessel coronary atherosclerosis. 7. Cholelithiasis. CT lumbar 1/7: Destructive mass lesion right sacrum, probable metastatic disease. No other bone lesions Micro Data:    Antimicrobials:    Interim history/subjective:  no distress. Pain better   Objective   Blood pressure (Abnormal) 186/78, pulse 75, temperature (Abnormal) 97.5 F (36.4 C), temperature source Oral, resp. rate 18, SpO2 98 %.       No intake or output data in the 24 hours ending 03/16/18 1401 There were no vitals filed for this visit.  Examination: General: 85 year old wm resting at bedside NAD HENT: NCAT no JVD  Lungs: clear no accessory use  Cardiovascular: RRR qw/out MRG Abdomen: soft not tender  Extremities: warm and dry  Neuro: awake and alert  GU: voids   Resolved Hospital Problem list    Assessment & Plan:   Right apical cavitary lung mass w/ what appears to be metastatic lesions involving the left 4th rib and sacrum. Also w/ LUL solitary nodule Plan Admitted for pain control PET scan would be helpful to identify degree of metastasis.  Will need either CT guided BX of the left chest wall mass (around the left rib lesion) or possible navigational approach (defer to [place]). If CT guided Spoke w/ IR. They would  want PET scan first.    CBC: Recent Labs  Lab 03/16/18 0532  WBC 9.5  NEUTROABS 6.5  HGB 14.1  HCT 43.5  MCV 99.1  PLT 161    Basic Metabolic Panel: Recent Labs  Lab 03/16/18 0532  NA 139  K 4.0  CL 101  CO2 26  GLUCOSE 131*  BUN 26*  CREATININE 1.49*  CALCIUM 9.4   GFR: CrCl cannot be calculated (Unknown ideal weight.). Recent Labs    Lab 03/16/18 0532  WBC 9.5    Liver Function Tests: No results for input(s): AST, ALT, ALKPHOS, BILITOT, PROT, ALBUMIN in the last 168 hours. No results for input(s): LIPASE, AMYLASE in the last 168 hours. No results for input(s): AMMONIA in the last 168 hours.  ABG    Component Value Date/Time   PHART 7.382 07/23/2010 0343   PCO2ART 34.2 (L) 07/23/2010 0343   PO2ART 80.0 07/23/2010 0343   HCO3 20.2 07/23/2010 0343   TCO2 23 07/15/2015 0604   ACIDBASEDEF 4.0 (H) 07/23/2010 0343   O2SAT 95.0 07/23/2010 0343     Coagulation Profile: No results for input(s): INR, PROTIME in the last 168 hours.  Cardiac Enzymes: No results for input(s): CKTOTAL, CKMB, CKMBINDEX, TROPONINI in the last 168 hours.  HbA1C: Hgb A1c MFr Bld  Date/Time Value Ref Range Status  01/23/2017 03:26 PM 6.3 (H) <5.7 % of total Hgb Final    Comment:    For someone without known diabetes, a hemoglobin  A1c value between 5.7% and 6.4% is consistent with prediabetes and should be confirmed with a  follow-up test. . For someone with known diabetes, a value <7% indicates that their diabetes is well controlled. A1c targets should be individualized based on duration of diabetes, age, comorbid conditions, and other considerations. . This assay result is consistent with an increased risk of diabetes. . Currently, no consensus exists regarding use of hemoglobin A1c for diagnosis of diabetes for children. Marland Kitchen   09/27/2015 03:12 PM 6.2 (H) <5.7 % Final    Comment:      For someone without known diabetes, a hemoglobin A1c value between 5.7% and 6.4% is consistent with prediabetes and should be confirmed with a follow-up test.   For someone with known diabetes, a value <7% indicates that their diabetes is well controlled. A1c targets should be individualized based on duration of diabetes, age, co-morbid conditions and other considerations.   This assay result is consistent with an increased risk of  diabetes.   Currently, no consensus exists regarding use of hemoglobin A1c for diagnosis of diabetes in children.       CBG: No results for input(s): GLUCAP in the last 168 hours.  Review of Systems:   Review of Systems  Constitutional: Negative for chills, fever, malaise/fatigue and weight loss.  HENT: Negative.   Eyes: Negative.   Respiratory: Positive for shortness of breath. Negative for cough, hemoptysis and sputum production.   Cardiovascular: Positive for chest pain.  Gastrointestinal: Negative.   Genitourinary: Negative.   Musculoskeletal: Positive for back pain.  Skin: Negative.   Neurological: Positive for focal weakness and weakness.  Endo/Heme/Allergies: Negative.   Psychiatric/Behavioral: Negative.      Past Medical History  He,  has a past medical history of Arthritis, CKD (chronic kidney disease) stage 3, GFR 30-59 ml/min (HCC), COPD (chronic obstructive pulmonary disease) ([place]), Diabetes mellitus, GERD (gastroesophageal reflux disease), H/O: GI bleed, Hiatal hernia, Hyperlipidemia, Hypertension, Myocardial infarction [facility]), Peripheral vascular disease, unspecified ([place]), Renal artery stenosis ([place]),  and Stroke [facility]).   Surgical History    Past Surgical History:  Procedure Laterality Date  . Aortobifemoral BPG  07/22/10   and Right femoral-popliteal BPG     Social History   reports that he quit smoking about 8 years ago. His smoking use included cigarettes. He has a 65.00 pack-year smoking history. He has never used smokeless tobacco. He reports that he does not drink alcohol or use drugs.   Family History   His family history includes CAD in his brother; Cancer (age of onset: 11) in his father; Dementia (age of onset: 24) in his brother.   Allergies Allergies  Allergen Reactions  . Penicillins Rash    DID THE REACTION INVOLVE: Swelling of the face/tongue/throat, SOB, or low BP? Yes Sudden or severe rash/hives, skin peeling, or the inside of the mouth  or nose? No Did it require medical treatment? Was already in hosp When did it last happen?40 years ago If all above answers are "NO", may proceed with cephalosporin use.      Home Medications  Prior to Admission medications   Medication Sig Start Date End Date Taking? Authorizing Provider  ALPRAZolam (XANAX) 0.5 MG tablet TAKE 1 TABLET BY MOUTH AT BEDTIME Patient taking differently: Take 0.5 mg by mouth at bedtime.  01/05/18  Yes Susy Frizzle, MD  atorvastatin (LIPITOR) 20 MG tablet TAKE 1 TABLET BY MOUTH ONCE DAILY AT  6  PM Patient taking differently: Take 20 mg by mouth at bedtime.  09/25/17  Yes Susy Frizzle, MD  budesonide-formoterol [facility]) 160-4.5 MCG/ACT inhaler Inhale 2 puffs into the lungs 2 (two) times daily as needed (for COPD flares).   Yes [provider]  clopidogrel (PLAVIX) 75 MG tablet Take 1 tablet (75 mg total) by mouth daily. 05/01/17  Yes Susy Frizzle, MD  famotidine (PEPCID) 20 MG tablet TAKE 1 TABLET BY MOUTH TWICE DAILY Patient taking differently: Take 20 mg by mouth at bedtime.  07/27/17  Yes Susy Frizzle, MD  ibuprofen (ADVIL,MOTRIN) 200 MG tablet Take 200 mg by mouth every 6 (six) hours as needed (for pain).   Yes [provider]  metoprolol succinate (TOPROL-XL) 25 MG 24 hr tablet TAKE ONE TABLET BY MOUTH ONCE DAILY Patient taking differently: Take 25 mg by mouth daily at 6 PM.  04/14/17  Yes Pickard, Cammie Mcgee, MD  naproxen sodium (ALEVE) 220 MG tablet Take 220 mg by mouth 2 (two) times daily as needed (for pain).   Yes [provider]  NITROSTAT 0.4 MG SL tablet DISSOLVE ONE TABLET UNDER THE TONGUE AS DIRECTED Patient taking differently: Place 0.4 mg under the tongue every 5 (five) minutes as needed for chest pain.  08/09/15  Yes Susy Frizzle, MD       Erick Colace ACNP-BC [place] Pager # [phone] OR # [phone] if no answer

## 2018-03-16 NOTE — ED Provider Notes (Signed)
TIME SEEN: 5:15 AM  CHIEF COMPLAINT: Back pain, right leg pain  HPI: Patient is an 85 year old male with history of hypertension, hyperlipidemia, CVA, COPD, CKD, previous aortobifemoral bypass graft and right femoral-popliteal bypass graft in May 2012 on Plavix who presents emergency department with lower back pain that radiates down his right leg.  States this has been ongoing for the past month and a half and started after a very active day.  He does not recall any injury to his leg.  Symptoms are worse with walking and better with rest but he is still having back pain at rest.  States because of the pain he is unable to sleep.  Does not describe any electric shooting pain down the leg.  No numbness, weakness, bowel or bladder incontinence, fever.  He was concerned that something could be happening with his bypass graft.  He has not seen Dr. Oneida Alar since 2018.  Denies chest pain or abdominal pain.  ROS: See HPI Constitutional: no fever  Eyes: no drainage  ENT: no runny nose   Cardiovascular:  no chest pain  Resp: no SOB  GI: no vomiting GU: no dysuria Integumentary: no rash  Allergy: no hives  Musculoskeletal: no leg swelling  Neurological: no slurred speech ROS otherwise negative  PAST MEDICAL HISTORY/PAST SURGICAL HISTORY:  Past Medical History:  Diagnosis Date  . Arthritis   . Bronchitis   . COPD (chronic obstructive pulmonary disease) ([place])   . Diabetes mellitus   . GERD (gastroesophageal reflux disease)   . H/O: GI bleed   . Hiatal hernia   . Hyperlipidemia   . Hypertension   . Myocardial infarction [facility])    1978 & 2011  . Peripheral vascular disease, unspecified ([place])   . Renal artery stenosis ([place])   . Stroke [facility])     MEDICATIONS:  Prior to Admission medications   Medication Sig Start Date End Date Taking? Authorizing Provider  ALPRAZolam (XANAX) 0.5 MG tablet TAKE 1 TABLET BY MOUTH AT BEDTIME Patient taking differently: Take 0.5 mg by mouth at bedtime.  01/05/18   Yes Susy Frizzle, MD  atorvastatin (LIPITOR) 20 MG tablet TAKE 1 TABLET BY MOUTH ONCE DAILY AT  6  PM Patient taking differently: Take 20 mg by mouth at bedtime.  09/25/17  Yes Susy Frizzle, MD  budesonide-formoterol [facility]) 160-4.5 MCG/ACT inhaler Inhale 2 puffs into the lungs 2 (two) times daily as needed (for COPD flares).   Yes [provider]  clopidogrel (PLAVIX) 75 MG tablet Take 1 tablet (75 mg total) by mouth daily. 05/01/17  Yes Susy Frizzle, MD  famotidine (PEPCID) 20 MG tablet TAKE 1 TABLET BY MOUTH TWICE DAILY Patient taking differently: Take 20 mg by mouth at bedtime.  07/27/17  Yes Susy Frizzle, MD  ibuprofen (ADVIL,MOTRIN) 200 MG tablet Take 200 mg by mouth every 6 (six) hours as needed (for pain).   Yes [provider]  metoprolol succinate (TOPROL-XL) 25 MG 24 hr tablet TAKE ONE TABLET BY MOUTH ONCE DAILY Patient taking differently: Take 25 mg by mouth daily at 6 PM.  04/14/17  Yes Pickard, Cammie Mcgee, MD  naproxen sodium (ALEVE) 220 MG tablet Take 220 mg by mouth 2 (two) times daily as needed (for pain).   Yes [provider]  NITROSTAT 0.4 MG SL tablet DISSOLVE ONE TABLET UNDER THE TONGUE AS DIRECTED Patient taking differently: Place 0.4 mg under the tongue every 5 (five) minutes as needed for chest pain.  08/09/15  Yes Susy Frizzle, MD    ALLERGIES:  Allergies  Allergen Reactions  . Penicillins Rash    DID THE REACTION INVOLVE: Swelling of the face/tongue/throat, SOB, or low BP? Yes Sudden or severe rash/hives, skin peeling, or the inside of the mouth or nose? No Did it require medical treatment? Was already in hosp When did it last happen?40 years ago If all above answers are "NO", may proceed with cephalosporin use.     SOCIAL HISTORY:  Social History   Tobacco Use  . Smoking status: Former Smoker    Years: 65.00    Types: Cigarettes    Last attempt to quit: 07/08/2009    Years since quitting: 8.6  . Smokeless  tobacco: Never Used  Substance Use Topics  . Alcohol use: No    FAMILY HISTORY: Family History  Problem Relation Age of Onset  . Cancer Father        LIVER CANCER    EXAM: BP (!) 191/79 (BP Location: Left Arm)   Pulse 81   Temp (!) 97.5 F (36.4 C) (Oral)   Resp 16   SpO2 99%  CONSTITUTIONAL: Alert and oriented and responds appropriately to questions. Well-appearing; well-nourished, elderly, appears uncomfortable especially with ambulation HEAD: Normocephalic EYES: Conjunctivae clear, pupils appear equal, EOMI ENT: normal nose; moist mucous membranes NECK: Supple, no meningismus, no nuchal rigidity, no LAD  CARD: RRR; S1 and S2 appreciated; no murmurs, no clicks, no rubs, no gallops RESP: Normal chest excursion without splinting or tachypnea; breath sounds clear and equal bilaterally; no wheezes, no rhonchi, no rales, no hypoxia or respiratory distress, speaking full sentences ABD/GI: Normal bowel sounds; non-distended; soft, non-tender, no rebound, no guarding, no peritoneal signs, no hepatosplenomegaly BACK:  The back appears normal and is tender over the lower lumbar spine without step-off or deformity, no redness or warmth noted, no ecchymosis or swelling, there is no CVA tenderness EXT: Normal ROM in all joints; non-tender to palpation; no edema; normal capillary refill; no calf tenderness or swelling, I am able to Doppler a DP and PT pulse in the left leg but unable to Doppler DP and PT pulse in the right leg.  Right leg feels slightly more cool to touch than the left.  No cyanosis noted.  No bony deformity or bony tenderness.  No joint effusion.  Compartments soft. SKIN: Normal color for age and race; warm; no rash NEURO: Moves all extremities equally, able to ambulate but walks with a limping gait secondary to pain down the right leg, normal sensation in all 4 extremities, no saddle anesthesia, no drift PSYCH: The patient's mood and manner are appropriate. Grooming and personal  hygiene are appropriate.  MEDICAL DECISION MAKING: Patient here with back pain.  This could be radiculopathy but I am also concerned about arterial occlusion, claudication given I am unable to Doppler or palpate a pulse in his right foot.  States symptoms have been ongoing for the past month and a half.  Worsened over the past several days where he could not sleep and that is what brought him to the ED today.  No chest pain or abdominal pain.  No focal neurologic deficits.  Will obtain CT scan of the aorta with runoff as well as the lumbar spine.  Will give pain medication here.  Urine shows no sign of infection.  Doubt pyelonephritis, kidney stone.  ED PROGRESS: Labs appear to be at baseline.  Creatinine 1.49.  Urine shows no sign of infection.  Signed out to  Dr. Fuller Plan to follow-up on CT scan.  Patient's pain well controlled currently and he is resting.  Patient and son have been updated with plan.   I reviewed all nursing notes, vitals, pertinent previous records, EKGs, lab and urine results, imaging (as available).   EKG Interpretation  Date/Time:  Tuesday March 16 2018 05:53:00 EST Ventricular Rate:  73 PR Interval:  188 QRS Duration: 96 QT Interval:  406 QTC Calculation: 447 R Axis:   62 Text Interpretation:  Normal sinus rhythm Nonspecific ST and T wave abnormality Abnormal ECG No significant change since last tracing Confirmed by Ward, Cyril Mourning [phone]) on 03/16/2018 5:54:58 AM         Ward, Delice Bison, DO 03/16/18 [id number]

## 2018-03-16 NOTE — ED Notes (Signed)
MD in w pt

## 2018-03-16 NOTE — H&P (Signed)
History and Physical    Martin [name] NGE:[id number] DOB: September 18, 1930 DOA: 03/15/2018  PCP: Susy Frizzle, MD Consultants:  [facility] - vascular; [facility] - cardiology Patient coming from:  Home - lives alone; NOK: [place], [phone]; Sister, [phone]  Chief Complaint: back pain  HPI: Martin [name] is a 85 y.o. male with medical history significant of CVA; PVD with renal artery steonsis; CAD; HRN; HLD; GERD; DM; and COPD presenting with back pain.  He came in for back pain.  It has been unbearable for about 6 weeks.  The pain is in his low lumbar/R sacral spine and radiates down his right leg.  He  Has tried taking Advil and Aleve without relief.  +SOB with exertion at times, but has h/o COPD.  Some cough, occasionally productive of whitish or greenish phlegm.  No other pain.  No weight los.  No night sweats.  He golfs regularly (usually 4 days a week) but has been unable to play for the last 6+ weeks due to pain.   ED Course:  Presented with persistent back pain and sciatica.  H/o graft and all is fine but with met to sacrum at site of pain.  Appears to have spiculated lung mass as primary.  Dr. Alvy Bimler recommends admission at [facility] for evaluation.  Pulm to see - IR, CT surgery, pulm may need to get a sample.  Oncology to follow, but try to get a tissue biopsy.  Review of Systems: As per HPI; otherwise review of systems reviewed and negative.   Ambulatory Status:  Ambulates without assistance, but has needed a cane during this last month  Past Medical History:  Diagnosis Date  . Arthritis   . CKD (chronic kidney disease) stage 3, GFR 30-59 ml/min (HCC)   . COPD (chronic obstructive pulmonary disease) ([place])   . Diabetes mellitus    denies, reports resolved with diet  . GERD (gastroesophageal reflux disease)   . H/O: GI bleed   . Hiatal hernia   . Hyperlipidemia   . Hypertension   . Myocardial infarction [facility])    1978 & 2011  . Peripheral vascular disease, unspecified ([place])   .  Renal artery stenosis ([place])   . Stroke [facility])     Past Surgical History:  Procedure Laterality Date  . Aortobifemoral BPG  07/22/10   and Right femoral-popliteal BPG    Social History   Socioeconomic History  . Marital status: Widowed    Spouse name: Not on file  . Number of children: Not on file  . Years of education: Not on file  . Highest education level: Not on file  Occupational History  . Occupation: retired  Scientific laboratory technician  . Financial resource strain: Not on file  . Food insecurity:    Worry: Not on file    Inability: Not on file  . Transportation needs:    Medical: Not on file    Non-medical: Not on file  Tobacco Use  . Smoking status: Former Smoker    Packs/day: 1.00    Years: 65.00    Pack years: 65.00    Types: Cigarettes    Last attempt to quit: 07/08/2009    Years since quitting: 8.6  . Smokeless tobacco: Never Used  Substance and Sexual Activity  . Alcohol use: No  . Drug use: No  . Sexual activity: Not on file  Lifestyle  . Physical activity:    Days per week: Not on file    Minutes per session: Not on  file  . Stress: Not on file  Relationships  . Social connections:    Talks on phone: Not on file    Gets together: Not on file    Attends religious service: Not on file    Active member of club or organization: Not on file    Attends meetings of clubs or organizations: Not on file    Relationship status: Not on file  . Intimate partner violence:    Fear of current or ex partner: Not on file    Emotionally abused: Not on file    Physically abused: Not on file    Forced sexual activity: Not on file  Other Topics Concern  . Not on file  Social History Narrative  . Not on file    Allergies  Allergen Reactions  . Penicillins Rash    DID THE REACTION INVOLVE: Swelling of the face/tongue/throat, SOB, or low BP? Yes Sudden or severe rash/hives, skin peeling, or the inside of the mouth or nose? No Did it require medical treatment? Was already in  hosp When did it last happen?40 years ago If all above answers are "NO", may proceed with cephalosporin use.     Family History  Problem Relation Age of Onset  . Cancer Father 25       LIVER CANCER  . Dementia Brother 64  . CAD Brother     Prior to Admission medications   Medication Sig Start Date End Date Taking? Authorizing Provider  ALPRAZolam (XANAX) 0.5 MG tablet TAKE 1 TABLET BY MOUTH AT BEDTIME Patient taking differently: Take 0.5 mg by mouth at bedtime.  01/05/18  Yes Susy Frizzle, MD  atorvastatin (LIPITOR) 20 MG tablet TAKE 1 TABLET BY MOUTH ONCE DAILY AT  6  PM Patient taking differently: Take 20 mg by mouth at bedtime.  09/25/17  Yes Susy Frizzle, MD  budesonide-formoterol [facility]) 160-4.5 MCG/ACT inhaler Inhale 2 puffs into the lungs 2 (two) times daily as needed (for COPD flares).   Yes [provider]  clopidogrel (PLAVIX) 75 MG tablet Take 1 tablet (75 mg total) by mouth daily. 05/01/17  Yes Susy Frizzle, MD  famotidine (PEPCID) 20 MG tablet TAKE 1 TABLET BY MOUTH TWICE DAILY Patient taking differently: Take 20 mg by mouth at bedtime.  07/27/17  Yes Susy Frizzle, MD  ibuprofen (ADVIL,MOTRIN) 200 MG tablet Take 200 mg by mouth every 6 (six) hours as needed (for pain).   Yes [provider]  metoprolol succinate (TOPROL-XL) 25 MG 24 hr tablet TAKE ONE TABLET BY MOUTH ONCE DAILY Patient taking differently: Take 25 mg by mouth daily at 6 PM.  04/14/17  Yes Pickard, Cammie Mcgee, MD  naproxen sodium (ALEVE) 220 MG tablet Take 220 mg by mouth 2 (two) times daily as needed (for pain).   Yes [provider]  NITROSTAT 0.4 MG SL tablet DISSOLVE ONE TABLET UNDER THE TONGUE AS DIRECTED Patient taking differently: Place 0.4 mg under the tongue every 5 (five) minutes as needed for chest pain.  08/09/15  Yes Susy Frizzle, MD    Physical Exam: Vitals:   03/16/18 0117 03/16/18 0834 03/16/18 0920  BP: (!) 191/79  (!) 186/78  Pulse: 81 83  75  Resp: 16  18  Temp: (!) 97.5 F (36.4 C)    TempSrc: Oral    SpO2: 99% 98% 98%     General:  Appears calm and comfortable and is NAD; he was fully dressed and sitting up in  a chair at the bedside throughout my evaluation Eyes:   EOMI, normal lids, iris ENT:  grossly normal hearing, lips & tongue, mmm Neck:  no LAD, masses or thyromegaly Cardiovascular:  RRR, no m/r/g. No LE edema.  Respiratory:   CTA bilaterally with no wheezes/rales/rhonchi.  Normal respiratory effort. Abdomen:  soft, NT, ND, NABS Back:   normal alignment, no CVAT Skin:  no rash or induration seen on limited exam Musculoskeletal:  grossly normal tone BUE/BLE, good ROM, no bony abnormality; he has TTP along the R sacrum at the location identified as a probable met on CT Psychiatric:  grossly normal mood and affect, speech fluent and appropriate, AOx3 Neurologic:  CN 2-12 grossly intact, moves all extremities in coordinated fashion, sensation intact    Radiological Exams on Admission: Dg Chest 2 View  Result Date: 03/16/2018 CLINICAL DATA:  Back pain, right leg pain. EXAM: CHEST - 2 VIEW COMPARISON:  08/24/2010 FINDINGS: Biapical pleural/parenchymal scarring. Heart is normal size. No confluent airspace opacities or effusions. No acute bony abnormality. IMPRESSION: Biapical scarring.  No active disease. Electronically Signed   By: Rolm Baptise M.D.   On: 03/16/2018 08:56   Ct Chest Wo Contrast  Result Date: 03/16/2018 CLINICAL DATA:  Lytic destructive lesion in the right sacrum on CT angiogram performed earlier today. EXAM: CT CHEST WITHOUT CONTRAST TECHNIQUE: Multidetector CT imaging of the chest was performed following the standard protocol without IV contrast. COMPARISON:  03/16/2018 CT angiogram of the abdomen and pelvis with lower extremity runoff. FINDINGS: Cardiovascular: Normal heart size. No significant pericardial effusion/thickening. Three-vessel coronary atherosclerosis. Atherosclerotic nonaneurysmal  thoracic aorta. Normal caliber pulmonary arteries. Mediastinum/Nodes: No discrete thyroid nodules. Unremarkable esophagus. No pathologically enlarged axillary, mediastinal or hilar lymph nodes, noting limited sensitivity for the detection of hilar adenopathy on this noncontrast study. Lungs/Pleura: No pneumothorax. No pleural effusion. Mild centrilobular and paraseptal emphysema with diffuse bronchial wall thickening. There is an irregular spiculated 4.3 x 1.6 cm apical right upper lobe lung mass (series 4/image 21) with cavitary change inferiorly. A few clustered small solid pulmonary nodules are noted in the apical left upper lobe, largest 4 mm (series 4/image 21). There is scattered mild cylindrical bronchiectasis in both lungs, most prominent in the left lower lobe. Upper abdomen: Cholelithiasis. Simple 2.0 cm medial upper left renal cyst. Partially visualized abdominal aortic surgical bypass graft. Musculoskeletal: Lytic destructive expansile 5.0 x 3.1 cm lateral left fourth rib mass (series 4/image 45). No additional focal osseous lesions in the chest. Mild thoracic spondylosis. IMPRESSION: 1. Partially cavitary irregular spiculated 4.3 x 1.6 cm apical right upper lobe lung mass. Given the suspected osseous metastases, this is suspicious for a primary bronchogenic carcinoma. Multidisciplinary thoracic oncology consultation suggested. PET-CT is suggested for further characterization and staging evaluation. 2. No thoracic adenopathy. 3. Lytic destructive expansile lateral left fourth rib mass most compatible with metastasis. 4. Clustered small solid pulmonary nodules in the apical left upper lobe, largest 4 mm, for which follow-up chest CT is advised in 3 months. 5. Scattered mild cylindrical bronchiectasis in both lungs, most prominent in the left lower lobe. 6. Three-vessel coronary atherosclerosis. 7. Cholelithiasis. Aortic Atherosclerosis (ICD10-I70.0) and Emphysema (ICD10-J43.9). Electronically Signed   By:  Ilona Sorrel M.D.   On: 03/16/2018 09:36   Ct Lumbar Spine Wo Contrast  Result Date: 03/16/2018 CLINICAL DATA:  Back pain EXAM: CT LUMBAR SPINE WITHOUT CONTRAST TECHNIQUE: Multidetector CT imaging of the lumbar spine was performed without intravenous contrast administration. Multiplanar CT image reconstructions were also generated.  COMPARISON:  CTA 02/19/2011 FINDINGS: Segmentation: Normal Alignment: Mild dextroscoliosis at L3-4.  Normal sagittal alignment. Vertebrae: Destructive mass lesion in the right sacrum measuring 3.5 cm. This was not present 2012 and is most consistent with metastatic disease. No other destructive bone lesions identified. Paraspinal and other soft tissues: Atherosclerotic disease. Vascular findings reported separately from CTA today. No Peri spinal mass or adenopathy. Coronary calcification.  Lung bases clear. Disc levels: T12-L1: Negative L1-2: Negative L2-3: Mild disc bulging and mild facet degeneration. Negative for stenosis L3-4: Asymmetric advanced disc degeneration on the left with disc space narrowing and spurring. Bilateral facet degeneration. Negative for stenosis L4-5: Moderate disc and facet degeneration.  Mild spinal stenosis. L5-S1: Asymmetric facet degeneration on the right with mild subarticular stenosis on the right due to spurring. IMPRESSION: Destructive mass lesion right sacrum, probable metastatic disease. No other bone lesions Lumbar scoliosis and multilevel degenerative changes above. These results were called by telephone at the time of interpretation on 03/16/2018 at 7:57 am to Dr. Regenia Skeeter, who verbally acknowledged these results. Electronically Signed   By: Franchot Gallo M.D.   On: 03/16/2018 07:57   Ct Angio Aortobifemoral W And/or Wo Contrast  Result Date: 03/16/2018 CLINICAL DATA:  Back pain.  Right leg pain. EXAM: CT ANGIOGRAPHY OF ABDOMINAL AORTA WITH ILIOFEMORAL RUNOFF TECHNIQUE: Multidetector CT imaging of the abdomen, pelvis and lower extremities was  performed using the standard protocol during bolus administration of intravenous contrast. Multiplanar CT image reconstructions and MIPs were obtained to evaluate the vascular anatomy. CONTRAST:  167m ISOVUE-370 IOPAMIDOL (ISOVUE-370) INJECTION 76% COMPARISON:  02/19/2011 FINDINGS: VASCULAR Aorta: Prior aorto bi femoral bypass. Negative aorta is heavily calcified. No aneurysm or dissection. Celiac: Calcified plaque in the proximal celiac artery without significant stenosis. SMA: Calcified plaque throughout the vessel, patent. Renals: Probable ostial stenoses bilaterally. IMA: Fills via collaterals. RIGHT Lower Extremity Inflow: Iliofemoral bypass is patent.  No aneurysm or dissection. Outflow: Heavily diseased/calcified common femoral artery. Runoff: Heavily calcified and diseased superficial femoral artery proximally. This occludes in the proximal to mid superficial femoral artery. There is a reported fem-pop bypass graft on the right which is not visualized, presumably occluded. Profunda is disease proximally but patent. Reconstitution of the distal popliteal artery. Proximal trifurcation vessels appear patent but very small and difficult to visualize below the mid calf. LEFT Lower Extremity Inflow: Aortofemoral bypass is patent. Outflow: Common femoral artery is diseased and calcified. Runoff: Occlusion of the left superficial femoral artery proximally. Profunda is patent. Reconstitution of the popliteal artery above the knee with moderate disease. Proximal trifurcation vessels are visualized and patent. Difficult to visualize below the mid calf. Veins: Grossly patent. Review of the MIP images confirms the above findings. NON-VASCULAR Lower chest: No acute abnormality. Hepatobiliary: Small layering gallstones in the gallbladder. No focal hepatic abnormality. Pancreas: No focal abnormality or ductal dilatation. Spleen: No focal abnormality.  Normal size. Adrenals/Urinary Tract: Areas of decreased enhancement and  cortical thinning in the lower poles of both kidneys, likely related to renovascular disease. No hydronephrosis or suspicious renal mass. Adrenal glands and urinary bladder unremarkable. Stomach/Bowel: Normal appendix. Left colonic diverticulosis. No active diverticulitis. No evidence of bowel obstruction. Lymphatic: No adenopathy. Reproductive: Prostate enlargement Other: No free fluid or free air. Musculoskeletal: There is a lytic destructive expansile lesion involving much of the right side of the sacrum measuring up to 4.2 cm on image 108. IMPRESSION: VASCULAR Prior aortobifemoral bypass which is patent. Heavily diseased and occluded superficial femoral arteries bilaterally, in the upper thigh  on the left and upper to mid thigh on the right. Reconstitution of the right popliteal artery below the knee which is heavily diseased. Proximal trifurcation vessels are patent but difficult to visualize below the mid calf. No visible patent bypass graft. Reconstitution of the left popliteal artery above the knee with moderate disease. Proximal trifurcation vessels are patent but difficult to visualize below the mid calf. NON-VASCULAR Lytic destructive lesion within the right sacrum concerning for metastasis or myeloma. Enlarged prostate. Left colonic diverticulosis.  No active diverticulitis. Layering gallstones within the gallbladder. These results were called by telephone at the time of interpretation on 03/16/2018 at 8:28 am to Dr. Regenia Skeeter , who verbally acknowledged these results. Electronically Signed   By: Rolm Baptise M.D.   On: 03/16/2018 08:29    EKG: not done   Labs on Admission: I have personally reviewed the available labs and imaging studies at the time of the admission.  Pertinent labs:   Glucose 131 BUN 26/Creatinine 1.49/GFR 42 Normal CBC UA: 50 glucose, 100 protein  Assessment/Plan Principal Problem:   Metastatic lung cancer (metastasis from lung to other site) [facility]) Active Problems:    HLD (hyperlipidemia)   TOBACCO ABUSE, HX OF   Diabetes mellitus, type 2 (HCC)   Atherosclerotic PVD with intermittent claudication (HCC)   Chronic obstructive pulmonary disease (HCC)   CKD (chronic kidney disease) stage 3, GFR 30-59 ml/min (HCC)   Essential hypertension   Anxiety state   Metastatic lung CA, probable, new -Patient presenting with right-sided sacrum pain and progressive inability to walk over the last 6 weeks -CT of LS spine shows a probable metastatic destructive lesion at basically the same place he is having pain -Further evaluation included a negative CXR but then a chest CT with a partially cavitary irregular spiculated 4.3 x 1.6 cm apical RUL mass which is suspicious for primary bronchogenic carcinoma as well as a lytic destructive expansile 5.0 x 3.1 cm lateral left fourth rib mass which is apparently asymptomatic -Dr. Alvy Bimler is aware and will follow the patient but recommends admission for further evaluation and treatment -Pulmonology consult was also requested by Dr. Regenia Skeeter -The patient will need a biopsy; since the sacrum and ribs are bony lesions, these may be difficult to obtain.  Lung biopsy is probably the best option.  This could be obtained via IR; pulmonology; or CT surgery.  Will defer to pulm about whether broch-guided biopsy is the best option.  If not, will next consult IR. -He does not have any current rib pain. -Will treat R sacral pain with lidoderm patch as well as Norco and morphine for breakthrough pain.  HTN -Continue Toprol XL  HLD -Continue Lipitor  DM -He reports that he no longer has diabetes due to lifestyle modifications. -A1c was 6.3 in 11/18 -Will follow with fasting AM labs -It is unlikely that he will need acute or chronic treatment for this issue at this time  COPD -He uses Symbicort prn, which is generally not particularly efficacious -Will give albuterol nebs  Stage 3 CKD -Appears to be stable at this time -Recheck BMP in  AM  PVD with claudication -He is having right leg claudication symptoms with ambulation, improving with rest -His CTA today does show fairly diffuse significant PVD -Continue ASA but hold Plavix in anticipation of biopsy -Consider outpatient vascular f/u once his presenting issue has been stabilized  Anxiety -Continue Xanax  DVT prophylaxis:  Lovenox  Code Status:  Full - confirmed with patient/family Family Communication: Friend  present throughout evaluation  Disposition Plan:  Home once clinically improved Consults called: Pulmonology; oncology  Admission status: Admit - It is my clinical opinion that admission to INPATIENT is reasonable and necessary because of the expectation that this patient will require hospital care that crosses at least 2 midnights to treat this condition based on the medical complexity of the problems presented.  Given the aforementioned information, the predictability of an adverse outcome is felt to be significant.    Karmen Bongo MD [facility]  If note is complete, please contact covering daytime or nighttime physician. [email or web address] Password [email or web address]  03/16/2018, 12:12 PM

## 2018-03-16 NOTE — ED Notes (Signed)
Pt transported to xray 

## 2018-03-16 NOTE — ED Notes (Signed)
Pt ambulatory to bathroom and back to room w/o difficulty.

## 2018-03-16 NOTE — ED Notes (Signed)
Pt talking w/male visitor.

## 2018-03-16 NOTE — ED Notes (Signed)
ED Provider at bedside. 

## 2018-03-16 NOTE — ED Notes (Signed)
Patient transported to CT 

## 2018-03-17 DIAGNOSIS — N179 Acute kidney failure, unspecified: Secondary | ICD-10-CM

## 2018-03-17 DIAGNOSIS — I2510 Atherosclerotic heart disease of native coronary artery without angina pectoris: Secondary | ICD-10-CM

## 2018-03-17 DIAGNOSIS — D381 Neoplasm of uncertain behavior of trachea, bronchus and lung: Secondary | ICD-10-CM

## 2018-03-17 DIAGNOSIS — J449 Chronic obstructive pulmonary disease, unspecified: Secondary | ICD-10-CM

## 2018-03-17 DIAGNOSIS — Z87891 Personal history of nicotine dependence: Secondary | ICD-10-CM

## 2018-03-17 DIAGNOSIS — M545 Low back pain: Secondary | ICD-10-CM

## 2018-03-17 DIAGNOSIS — I252 Old myocardial infarction: Secondary | ICD-10-CM

## 2018-03-17 DIAGNOSIS — I129 Hypertensive chronic kidney disease with stage 1 through stage 4 chronic kidney disease, or unspecified chronic kidney disease: Secondary | ICD-10-CM

## 2018-03-17 LAB — CBC
HEMATOCRIT: 42 % (ref 39.0–52.0)
HEMOGLOBIN: 13.6 g/dL (ref 13.0–17.0)
MCH: 32 pg (ref 26.0–34.0)
MCHC: 32.4 g/dL (ref 30.0–36.0)
MCV: 98.8 fL (ref 80.0–100.0)
Platelets: 215 10*3/uL (ref 150–400)
RBC: 4.25 MIL/uL (ref 4.22–5.81)
RDW: 12.5 % (ref 11.5–15.5)
WBC: 9.1 10*3/uL (ref 4.0–10.5)
nRBC: 0 % (ref 0.0–0.2)

## 2018-03-17 LAB — BASIC METABOLIC PANEL
Anion gap: 7 (ref 5–15)
BUN: 29 mg/dL — ABNORMAL HIGH (ref 8–23)
CHLORIDE: 101 mmol/L (ref 98–111)
CO2: 29 mmol/L (ref 22–32)
Calcium: 9.3 mg/dL (ref 8.9–10.3)
Creatinine, Ser: 1.57 mg/dL — ABNORMAL HIGH (ref 0.61–1.24)
GFR calc Af Amer: 46 mL/min — ABNORMAL LOW (ref >60)
GFR calc non Af Amer: 40 mL/min — ABNORMAL LOW (ref >60)
Glucose, Bld: 105 mg/dL — ABNORMAL HIGH (ref 70–99)
Potassium: 4.4 mmol/L (ref 3.5–5.1)
Sodium: 137 mmol/L (ref 135–145)

## 2018-03-17 MED ORDER — MORPHINE SULFATE (PF) 2 MG/ML IV SOLN
1.0000 mg | Freq: Once | INTRAVENOUS | Status: AC
  Administered 2018-03-17: 1 mg via INTRAVENOUS
  Filled 2018-03-17: qty 1

## 2018-03-17 NOTE — Care Management Note (Signed)
Case Management Note  Patient Details  Name: Martin [name] MRN: [id number] Date of Birth: [birth year]  Subjective/Objective:               New dx lung CA with bone mets.     Action/Plan:  Spoke to patient at bedside. He states that he live at home alone, he drives, he has no barriers to medication or physician access. He states up to 3 months ago he was golfing.  Expected Discharge Date:                  Expected Discharge Plan:  Home/Self Care  In-House Referral:     Discharge planning Services  CM Consult  Post Acute Care Choice:    Choice offered to:     DME Arranged:    DME Agency:     HH Arranged:    HH Agency:     Status of Service:  In process, will continue to follow  If discussed at Long Length of Stay Meetings, dates discussed:    Additional Comments:  Carles Collet, RN 03/17/2018, 11:55 AM

## 2018-03-17 NOTE — Evaluation (Signed)
Physical Therapy Evaluation Patient Details Name: Martin [name] MRN: [id number] DOB: 21-Feb-1931 Today's Date: 03/17/2018   History of Present Illness  Patient is 85 year old male with past medical history of CVA, peripheral vascular disease with renal artery stenosis,  Coronary artery disease with stents, hyperlipidemia, GERD, diabetes, COPD who presents with a complaint of back pain. CT demonstrated METS, oncology consult pending.     Clinical Impression  Pt admitted with above diagnosis. Pt currently with functional limitations due to the deficits listed below (see PT Problem List). PTA, pt living alone, independent with mobility, with several week progression of pain that limited his ability to walk. Today patient pain controled and ambulating hallway and stairs with out external support. Family concerned for patient at home once he is beginning CA treatment, discussed at this time unsure what plan is and that patient is moving very well. If patients pain is controlled and progresses well next session believe he can safely return home until course of care is determined with oncology.  Pt will benefit from skilled PT to increase their independence and safety with mobility to allow discharge to the venue listed below.    VSS on RA with activity.     Follow Up Recommendations Home health PT;Supervision for mobility/OOB     Equipment Recommendations  None recommended by PT    Recommendations for Other Services       Precautions / Restrictions Precautions Precautions: Fall Restrictions Weight Bearing Restrictions: No      Mobility  Bed Mobility Overal bed mobility: Modified Independent                Transfers Overall transfer level: Modified independent Equipment used: None                Ambulation/Gait Ambulation/Gait assistance: Supervision Gait Distance (Feet): 140 Feet Assistive device: None Gait Pattern/deviations: Step-to pattern;Step-through pattern Gait  velocity: decreased   General Gait Details: Patient ambulating unit without difficulty, stating his leg pain feels much better since medication. no overt LOB, SpO2 WNL on RA.   Stairs Stairs: Yes Stairs assistance: Supervision Stair Management: One rail Right;Step to pattern;Alternating pattern;Sideways Number of Stairs: 6 General stair comments: patient with alternating pattern, educated on step to step to reduce pain in R leg and increase safety.   Wheelchair Mobility    Modified Rankin (Stroke Patients Only)       Balance Overall balance assessment: Needs assistance   Sitting balance-Leahy Scale: Good       Standing balance-Leahy Scale: Fair                               Pertinent Vitals/Pain Pain Assessment: Faces Faces Pain Scale: Hurts a little bit Pain Location: R leg Pain Descriptors / Indicators: Cramping;Discomfort Pain Intervention(s): Limited activity within patient's tolerance;Monitored during session    [facility] expects to be discharged to:: Private residence Living Arrangements: Alone Available Help at Discharge: Family;Available PRN/intermittently Type of Home: House Home Access: Stairs to enter Entrance Stairs-Rails: Can reach both Entrance Stairs-Number of Steps: 4 Home Layout: Two level;Able to live on main level with bedroom/bathroom Home Equipment: Gilford Rile - 2 wheels;Wheelchair - manual      Prior Function Level of Independence: Independent         Comments: golfing 3 months ago, pt drives and ambulates without assistive device. last several weeks increasing pain and inability to walk      Hand Dominance  Dominant Hand: Right    Extremity/Trunk Assessment   Upper Extremity Assessment Upper Extremity Assessment: Overall WFL for tasks assessed    Lower Extremity Assessment Lower Extremity Assessment: Overall WFL for tasks assessed       Communication   Communication: No difficulties  Cognition  Arousal/Alertness: Awake/alert Behavior During Therapy: WFL for tasks assessed/performed Overall Cognitive Status: Within Functional Limits for tasks assessed                                        General Comments      Exercises     Assessment/Plan    PT Assessment Patient needs continued PT services  PT Problem List Decreased strength       PT Treatment Interventions DME instruction;Gait training;Stair training;Functional mobility training;Therapeutic activities;Therapeutic exercise    PT Goals (Current goals can be found in the Care Plan section)  Acute Rehab PT Goals Patient Stated Goal: "I'm not going to no nursing home. i want to go home with work with HHPT" PT Goal Formulation: With patient/family Time For Goal Achievement: 03/31/18 Potential to Achieve Goals: Good    Frequency Min 3X/week   Barriers to discharge Decreased caregiver support lives alone    Co-evaluation               AM-PAC PT "6 Clicks" Mobility  Outcome Measure Help needed turning from your back to your side while in a flat bed without using bedrails?: None Help needed moving from lying on your back to sitting on the side of a flat bed without using bedrails?: None Help needed moving to and from a bed to a chair (including a wheelchair)?: None Help needed standing up from a chair using your arms (e.g., wheelchair or bedside chair)?: A Little Help needed to walk in hospital room?: A Little Help needed climbing 3-5 steps with a railing? : A Little 6 Click Score: 21    End of Session Equipment Utilized During Treatment: Gait belt Activity Tolerance: Patient tolerated treatment well Patient left: in bed;with call bell/phone within reach;with family/visitor present Nurse Communication: Mobility status PT Visit Diagnosis: Unsteadiness on feet (R26.81);Pain    Time: 1416-1440 PT Time Calculation (min) (ACUTE ONLY): 24 min   Charges:   PT Evaluation $PT Eval Low  Complexity: 1 Low PT Treatments $Gait Training: 8-22 mins       Reinaldo Berber, PT, DPT Acute Rehabilitation Services Pager: [phone] Office: [phone]    Reinaldo Berber 03/17/2018, 4:33 PM

## 2018-03-17 NOTE — Consult Note (Signed)
[facility] [phone]   [phone]  CONSULT NOTE  REFERRING PHYSICIAN: Dr. Shelly Coss  REASON FOR CONSULTATION:  85 years old white male with suspicious lung cancer.  HPI Martin [name] is a 85 y.o. male with past medical history significant for osteoarthritis, chronic kidney disease, COPD, hypertension, dyslipidemia, coronary artery disease with myocardial infarction, for vascular disease, stroke and renal artery stenosis.  The patient also has a long history of smoking but quit 8 years ago.  Patient presented to the emergency department on 03/16/2018 complaining of back pain that was getting worse over the last 6 weeks involving the lower lumbar and right sacral areas with radiation to the right leg.  He was using over-the-counter NSAIDs.  He was also noticed to have shortness of breath with exertion and cough productive of whitish/greenish sputum.  He was very active in the past and used to play golf regularly.  CT scan of the lumbar spine without contrast was performed on 03/16/2017 and showed destructive mass lesion in the right sacrum suspicious to be metastatic in origin.  There was also a lumbar scoliosis and multilevel degenerative changes.  Patient also had CT of the chest without contrast yesterday and that showed an irregular spiculated 4.3 x 1.6 cm apical right upper lobe lung mass with cavitary changes inferiorly.  There was few clusters of small solid pulmonary nodules in the apical left upper lobe the largest measured 0.4 cm.  There was also a lytic destructive expansie 5.0 x 3.1 cm lateral left fourth rib mass.  I was asked to see the patient today for evaluation and recommendation regarding his condition and further investigation to confirm his diagnosis. When seen today the patient is feeling fine except for the low back pain.  He denied having any current chest pain but has shortness of breath with exertion with mild cough and no hemoptysis.   He denied having any fever or chills.  He has no nausea, vomiting, diarrhea or constipation.  He has no headache or visual changes. Family history significant for father died from liver cancer. The patient is a widow and has no children.  He is to work for [employer].  He has a history of smoking 1 pack/day for around 65 years and quit 8 years ago.  He has no history of alcohol or drug abuse. HPI  Past Medical History:  Diagnosis Date  . Arthritis   . CKD (chronic kidney disease) stage 3, GFR 30-59 ml/min (HCC)   . COPD (chronic obstructive pulmonary disease) ([place])   . Diabetes mellitus    denies, reports resolved with diet  . GERD (gastroesophageal reflux disease)   . H/O: GI bleed   . Hiatal hernia   . Hyperlipidemia   . Hypertension   . Myocardial infarction [facility])    1978 & 2011  . Peripheral vascular disease, unspecified ([place])   . Renal artery stenosis ([place])   . Stroke [facility])     Past Surgical History:  Procedure Laterality Date  . Aortobifemoral BPG  07/22/10   and Right femoral-popliteal BPG    Family History  Problem Relation Age of Onset  . Cancer Father 55       LIVER CANCER  . Dementia Brother 87  . CAD Brother     Social History Social History   Tobacco Use  . Smoking status: Former Smoker    Packs/day: 1.00    Years: 65.00    Pack years: 65.00  Types: Cigarettes    Last attempt to quit: 07/08/2009    Years since quitting: 8.6  . Smokeless tobacco: Never Used  Substance Use Topics  . Alcohol use: No  . Drug use: No    Allergies  Allergen Reactions  . Penicillins Rash    DID THE REACTION INVOLVE: Swelling of the face/tongue/throat, SOB, or low BP? Yes Sudden or severe rash/hives, skin peeling, or the inside of the mouth or nose? No Did it require medical treatment? Was already in hosp When did it last happen?40 years ago If all above answers are "NO", may proceed with cephalosporin use.     Current Facility-Administered  Medications  Medication Dose Route Frequency Provider Last Rate Last Dose  . acetaminophen (TYLENOL) tablet 650 mg  650 mg Oral Q6H PRN Karmen Bongo, MD       Or  . acetaminophen (TYLENOL) suppository 650 mg  650 mg Rectal Q6H PRN Karmen Bongo, MD      . albuterol (PROVENTIL) (2.5 MG/3ML) 0.083% nebulizer solution 2.5 mg  2.5 mg Nebulization Q2H PRN Karmen Bongo, MD      . ALPRAZolam Duanne Moron) tablet 0.5 mg  0.5 mg Oral Ivery Quale, MD   0.5 mg at 03/16/18 2300  . atorvastatin (LIPITOR) tablet 20 mg  20 mg Oral QHS Karmen Bongo, MD   20 mg at 03/16/18 2258  . docusate sodium (COLACE) capsule 100 mg  100 mg Oral BID Karmen Bongo, MD   100 mg at 03/17/18 1034  . enoxaparin (LOVENOX) injection 40 mg  40 mg Subcutaneous Q24H Karmen Bongo, MD   40 mg at 03/16/18 2009  . famotidine (PEPCID) tablet 20 mg  20 mg Oral QHS Karmen Bongo, MD   20 mg at 03/16/18 2259  . HYDROcodone-acetaminophen (NORCO/VICODIN) 5-325 MG per tablet 1-2 tablet  1-2 tablet Oral Q4H PRN Karmen Bongo, MD   2 tablet at 03/17/18 1035  . lidocaine (LIDODERM) 5 % 1 patch  1 patch Transdermal Q24H Karmen Bongo, MD   1 patch at 03/17/18 1034  . metoprolol succinate (TOPROL-XL) 24 hr tablet 25 mg  25 mg Oral q1800 Karmen Bongo, MD   25 mg at 03/16/18 1847  . ondansetron (ZOFRAN) tablet 4 mg  4 mg Oral Q6H PRN Karmen Bongo, MD       Or  . ondansetron [facility]) injection 4 mg  4 mg Intravenous Q6H PRN Karmen Bongo, MD        Review of Systems  Constitutional: positive for fatigue Eyes: negative Ears, nose, mouth, throat, and face: negative Respiratory: positive for cough and dyspnea on exertion Cardiovascular: negative Gastrointestinal: negative Genitourinary:negative Integument/breast: negative Hematologic/lymphatic: negative Musculoskeletal:positive for back pain Neurological: negative Behavioral/Psych: negative Endocrine: negative Allergic/Immunologic: negative  Physical  Exam  TML:YYTKP, healthy, no distress, well nourished, well developed and anxious SKIN: skin color, texture, turgor are normal, no rashes or significant lesions HEAD: Normocephalic, No masses, lesions, tenderness or abnormalities EYES: normal, PERRLA, Conjunctiva are pink and non-injected EARS: External ears normal, Canals clear OROPHARYNX:no exudate, no erythema and lips, buccal mucosa, and tongue normal  NECK: supple, no adenopathy, no JVD LYMPH:  no palpable lymphadenopathy, no hepatosplenomegaly LUNGS: clear to auscultation , and palpation HEART: regular rate & rhythm, no murmurs and no gallops ABDOMEN:abdomen soft, non-tender, normal bowel sounds and no masses or organomegaly BACK: Back symmetric, no curvature., No CVA tenderness EXTREMITIES:no joint deformities, effusion, or inflammation, no edema  NEURO: alert & oriented x 3 with fluent speech, no focal motor/sensory deficits  PERFORMANCE STATUS: ECOG 1  LABORATORY DATA: Lab Results  Component Value Date   WBC 9.1 03/17/2018   HGB 13.6 03/17/2018   HCT 42.0 03/17/2018   MCV 98.8 03/17/2018   PLT 215 03/17/2018    @LASTCHEM @  RADIOGRAPHIC STUDIES: Dg Chest 2 View  Result Date: 03/16/2018 CLINICAL DATA:  Back pain, right leg pain. EXAM: CHEST - 2 VIEW COMPARISON:  08/24/2010 FINDINGS: Biapical pleural/parenchymal scarring. Heart is normal size. No confluent airspace opacities or effusions. No acute bony abnormality. IMPRESSION: Biapical scarring.  No active disease. Electronically Signed   By: Rolm Baptise M.D.   On: 03/16/2018 08:56   Ct Chest Wo Contrast  Result Date: 03/16/2018 CLINICAL DATA:  Lytic destructive lesion in the right sacrum on CT angiogram performed earlier today. EXAM: CT CHEST WITHOUT CONTRAST TECHNIQUE: Multidetector CT imaging of the chest was performed following the standard protocol without IV contrast. COMPARISON:  03/16/2018 CT angiogram of the abdomen and pelvis with lower extremity runoff. FINDINGS:  Cardiovascular: Normal heart size. No significant pericardial effusion/thickening. Three-vessel coronary atherosclerosis. Atherosclerotic nonaneurysmal thoracic aorta. Normal caliber pulmonary arteries. Mediastinum/Nodes: No discrete thyroid nodules. Unremarkable esophagus. No pathologically enlarged axillary, mediastinal or hilar lymph nodes, noting limited sensitivity for the detection of hilar adenopathy on this noncontrast study. Lungs/Pleura: No pneumothorax. No pleural effusion. Mild centrilobular and paraseptal emphysema with diffuse bronchial wall thickening. There is an irregular spiculated 4.3 x 1.6 cm apical right upper lobe lung mass (series 4/image 21) with cavitary change inferiorly. A few clustered small solid pulmonary nodules are noted in the apical left upper lobe, largest 4 mm (series 4/image 21). There is scattered mild cylindrical bronchiectasis in both lungs, most prominent in the left lower lobe. Upper abdomen: Cholelithiasis. Simple 2.0 cm medial upper left renal cyst. Partially visualized abdominal aortic surgical bypass graft. Musculoskeletal: Lytic destructive expansile 5.0 x 3.1 cm lateral left fourth rib mass (series 4/image 45). No additional focal osseous lesions in the chest. Mild thoracic spondylosis. IMPRESSION: 1. Partially cavitary irregular spiculated 4.3 x 1.6 cm apical right upper lobe lung mass. Given the suspected osseous metastases, this is suspicious for a primary bronchogenic carcinoma. Multidisciplinary thoracic oncology consultation suggested. PET-CT is suggested for further characterization and staging evaluation. 2. No thoracic adenopathy. 3. Lytic destructive expansile lateral left fourth rib mass most compatible with metastasis. 4. Clustered small solid pulmonary nodules in the apical left upper lobe, largest 4 mm, for which follow-up chest CT is advised in 3 months. 5. Scattered mild cylindrical bronchiectasis in both lungs, most prominent in the left lower lobe. 6.  Three-vessel coronary atherosclerosis. 7. Cholelithiasis. Aortic Atherosclerosis (ICD10-I70.0) and Emphysema (ICD10-J43.9). Electronically Signed   By: Ilona Sorrel M.D.   On: 03/16/2018 09:36   Ct Lumbar Spine Wo Contrast  Result Date: 03/16/2018 CLINICAL DATA:  Back pain EXAM: CT LUMBAR SPINE WITHOUT CONTRAST TECHNIQUE: Multidetector CT imaging of the lumbar spine was performed without intravenous contrast administration. Multiplanar CT image reconstructions were also generated. COMPARISON:  CTA 02/19/2011 FINDINGS: Segmentation: Normal Alignment: Mild dextroscoliosis at L3-4.  Normal sagittal alignment. Vertebrae: Destructive mass lesion in the right sacrum measuring 3.5 cm. This was not present 2012 and is most consistent with metastatic disease. No other destructive bone lesions identified. Paraspinal and other soft tissues: Atherosclerotic disease. Vascular findings reported separately from CTA today. No Peri spinal mass or adenopathy. Coronary calcification.  Lung bases clear. Disc levels: T12-L1: Negative L1-2: Negative L2-3: Mild disc bulging and mild facet degeneration. Negative for stenosis L3-4: Asymmetric advanced  disc degeneration on the left with disc space narrowing and spurring. Bilateral facet degeneration. Negative for stenosis L4-5: Moderate disc and facet degeneration.  Mild spinal stenosis. L5-S1: Asymmetric facet degeneration on the right with mild subarticular stenosis on the right due to spurring. IMPRESSION: Destructive mass lesion right sacrum, probable metastatic disease. No other bone lesions Lumbar scoliosis and multilevel degenerative changes above. These results were called by telephone at the time of interpretation on 03/16/2018 at 7:57 am to Dr. Regenia Skeeter, who verbally acknowledged these results. Electronically Signed   By: Franchot Gallo M.D.   On: 03/16/2018 07:57   Ct Angio Aortobifemoral W And/or Wo Contrast  Result Date: 03/16/2018 CLINICAL DATA:  Back pain.  Right leg pain.  EXAM: CT ANGIOGRAPHY OF ABDOMINAL AORTA WITH ILIOFEMORAL RUNOFF TECHNIQUE: Multidetector CT imaging of the abdomen, pelvis and lower extremities was performed using the standard protocol during bolus administration of intravenous contrast. Multiplanar CT image reconstructions and MIPs were obtained to evaluate the vascular anatomy. CONTRAST:  119mL ISOVUE-370 IOPAMIDOL (ISOVUE-370) INJECTION 76% COMPARISON:  02/19/2011 FINDINGS: VASCULAR Aorta: Prior aorto bi femoral bypass. Negative aorta is heavily calcified. No aneurysm or dissection. Celiac: Calcified plaque in the proximal celiac artery without significant stenosis. SMA: Calcified plaque throughout the vessel, patent. Renals: Probable ostial stenoses bilaterally. IMA: Fills via collaterals. RIGHT Lower Extremity Inflow: Iliofemoral bypass is patent.  No aneurysm or dissection. Outflow: Heavily diseased/calcified common femoral artery. Runoff: Heavily calcified and diseased superficial femoral artery proximally. This occludes in the proximal to mid superficial femoral artery. There is a reported fem-pop bypass graft on the right which is not visualized, presumably occluded. Profunda is disease proximally but patent. Reconstitution of the distal popliteal artery. Proximal trifurcation vessels appear patent but very small and difficult to visualize below the mid calf. LEFT Lower Extremity Inflow: Aortofemoral bypass is patent. Outflow: Common femoral artery is diseased and calcified. Runoff: Occlusion of the left superficial femoral artery proximally. Profunda is patent. Reconstitution of the popliteal artery above the knee with moderate disease. Proximal trifurcation vessels are visualized and patent. Difficult to visualize below the mid calf. Veins: Grossly patent. Review of the MIP images confirms the above findings. NON-VASCULAR Lower chest: No acute abnormality. Hepatobiliary: Small layering gallstones in the gallbladder. No focal hepatic abnormality.  Pancreas: No focal abnormality or ductal dilatation. Spleen: No focal abnormality.  Normal size. Adrenals/Urinary Tract: Areas of decreased enhancement and cortical thinning in the lower poles of both kidneys, likely related to renovascular disease. No hydronephrosis or suspicious renal mass. Adrenal glands and urinary bladder unremarkable. Stomach/Bowel: Normal appendix. Left colonic diverticulosis. No active diverticulitis. No evidence of bowel obstruction. Lymphatic: No adenopathy. Reproductive: Prostate enlargement Other: No free fluid or free air. Musculoskeletal: There is a lytic destructive expansile lesion involving much of the right side of the sacrum measuring up to 4.2 cm on image 108. IMPRESSION: VASCULAR Prior aortobifemoral bypass which is patent. Heavily diseased and occluded superficial femoral arteries bilaterally, in the upper thigh on the left and upper to mid thigh on the right. Reconstitution of the right popliteal artery below the knee which is heavily diseased. Proximal trifurcation vessels are patent but difficult to visualize below the mid calf. No visible patent bypass graft. Reconstitution of the left popliteal artery above the knee with moderate disease. Proximal trifurcation vessels are patent but difficult to visualize below the mid calf. NON-VASCULAR Lytic destructive lesion within the right sacrum concerning for metastasis or myeloma. Enlarged prostate. Left colonic diverticulosis.  No active diverticulitis. Layering gallstones  within the gallbladder. These results were called by telephone at the time of interpretation on 03/16/2018 at 8:28 am to Dr. Regenia Skeeter , who verbally acknowledged these results. Electronically Signed   By: Rolm Baptise M.D.   On: 03/16/2018 08:29    ASSESSMENT: This is a very pleasant 85 years old white male with highly suspicious metastatic lung cancer pending tissue diagnosis and further staging work-up.  He presented with large expansile left fourth rib  lesion in addition to right upper lobe lung mass and questionable metastatic disease to the lower lumbar and sacral spine.   PLAN: I had a lengthy discussion with the patient today about his current condition and further investigation to confirm his diagnosis. I would arrange for the patient to have a PET scan on outpatient basis to complete the staging work-up.  The patient may also benefit from half MRI of the brain either during his hospitalization or on outpatient basis. I strongly recommend for the patient to have CT-guided core biopsy of the lytic left fourth rib lesion for tissue diagnosis.  He may not be a good candidate for bronchoscopy and biopsy of the right upper lobe lung nodule because of the peripheral location. I will arrange for the patient an appointment to see me at the cancer center after his discharge for further evaluation and more recommendation regarding treatment of his condition. For the low back pain, he may benefit from evaluation by radiation oncology for consideration of palliative radiotherapy to that area.  We will also need to continue on his current pain medication for pain management.  The patient voices understanding of current disease status and treatment options and is in agreement with the current care plan.  All questions were answered. The patient knows to call the clinic with any problems, questions or concerns. We can certainly see the patient much sooner if necessary.  Thank you so much for allowing me to participate in the care of [employer]. I will continue to follow up the patient with you and assist in his care.  Disclaimer: This note was dictated with voice recognition software. Similar sounding words can inadvertently be transcribed and may not be corrected upon review.   Eilleen Kempf March 17, 2018, 3:29 PM

## 2018-03-17 NOTE — Progress Notes (Signed)
PROGRESS NOTE    Martin [name]  YIR:[id number] DOB: 1930/04/05 DOA: 03/15/2018 PCP: Susy Frizzle, MD   Brief Narrative: Patient is 85 year old male with past medical history of CVA, peripheral vascular disease with renal artery stenosis,  Coronary artery disease with stents, hyperlipidemia, GERD, diabetes, COPD who presents with a complaint of back pain.  It has been there for last 6 weeks.  Imaging showed cavitary spiculated 4.3x 1.6 cm left apical right upper lobe mass.  Also showed lytic destruction in the right sacrum suggestive of metastatic disease.  Pulmonology, oncology and IR consulted.  Assessment & Plan:   Principal Problem:   Metastatic lung cancer (metastasis from lung to other site) [facility]) Active Problems:   HLD (hyperlipidemia)   TOBACCO ABUSE, HX OF   Diabetes mellitus, type 2 (HCC)   Atherosclerotic PVD with intermittent claudication (HCC)   Chronic obstructive pulmonary disease (HCC)   CKD (chronic kidney disease) stage 3, GFR 30-59 ml/min (HCC)   Essential hypertension   Anxiety state  Metastatic lung cancer: Presented with right-sided sacrum pain, progressive inability to walk for the last 6 weeks.  CT of the lumbar spine showed metastatic destructive lesion on the area of pain.  CT chest showed partially cavitary irregular spiculated 4.3x 1.6 cm left apical right upper lobe mass suspicious for primary bronchogenic carcinoma.  Also found to have lateral left fourth rib mass which is apparently asymptomatic. Oncology consulted. Pulmonary evaluated the patient and concluded that bronchoscopic approach will be difficult. IR also consulted for biopsy of the chest wall mass.  IR recommended PET/CT to confirm activity in the chest wall mass and possible outpatient biopsy. Waiting for oncology evaluation for further direction on work-up and management. Also requested for PT evaluation.  Hypertension: Currently blood pressure stable.  Continue current meds  CKD stage  III: Currently kidney function on baseline.  COPD: Currently stable.  Continue bronchodilators as needed.  Hyperlipidemia: Continue Lipitor  Diabetes mellitus: Last hemoglobin A1c was 6.3 as per 11/18.  Anxiety: Continue Xanax         DVT prophylaxis: Lovenox Code Status: Full Family Communication: None present at the bedside Disposition Plan: Home after PT/oncology evaluation   Consultants: Oncology  Procedures: None  Antimicrobials: None  Subjective: Patient seen and examined at bedside this morning.  He looked comfortable.  Still complains of back pain but looks like pain is well controlled.  No complaint of chest pain or shortness of breath.  Objective: Vitals:   03/16/18 2233 03/17/18 0300 03/17/18 0717 03/17/18 1037  BP: (!) 145/70  (!) 141/72 (!) 141/72  Pulse: 88  65 65  Resp: 16  16 16   Temp: 97.9 F (36.6 C)  (!) 97.5 F (36.4 C) (!) 97.5 F (36.4 C)  TempSrc: Oral  Oral Oral  SpO2: 96%  95%   Weight:  65.3 kg  65.3 kg  Height:    5\' 6"  (1.676 m)    Intake/Output Summary (Last 24 hours) at 03/17/2018 1347 Last data filed at 03/16/2018 1800 Gross per 24 hour  Intake 240 ml  Output -  Net 240 ml   Filed Weights   03/17/18 0300 03/17/18 1037  Weight: 65.3 kg 65.3 kg    Examination:  General exam: Not in distress,average built, elderly male HEENT:PERRL,Oral mucosa moist, Ear/Nose normal on gross exam, no lymphadenopathy Respiratory system: Bilateral decreased air entry in the bases, normal vesicular breath sounds, no wheezes or crackles  Cardiovascular system: S1 & S2 heard, RRR. No JVD, murmurs,  rubs, gallops or clicks. No pedal edema. Gastrointestinal system: Abdomen is nondistended, soft and nontender. No organomegaly or masses felt. Normal bowel sounds heard. Central nervous system: Alert and oriented. No focal neurological deficits. Extremities: No edema, no clubbing ,no cyanosis, distal peripheral pulses palpable. Skin: No rashes, lesions or  ulcers,no icterus ,no pallor MSK: Normal muscle bulk,tone ,power Psychiatry: Judgement and insight appear normal. Mood & affect appropriate.     Data Reviewed: I have personally reviewed following labs and imaging studies  CBC: Recent Labs  Lab 03/16/18 0532 03/17/18 0215  WBC 9.5 9.1  NEUTROABS 6.5  --   HGB 14.1 13.6  HCT 43.5 42.0  MCV 99.1 98.8  PLT 191 742   Basic Metabolic Panel: Recent Labs  Lab 03/16/18 0532 03/17/18 0215  NA 139 137  K 4.0 4.4  CL 101 101  CO2 26 29  GLUCOSE 131* 105*  BUN 26* 29*  CREATININE 1.49* 1.57*  CALCIUM 9.4 9.3   GFR: Estimated Creatinine Clearance: 31 mL/min (A) (by C-G formula based on SCr of 1.57 mg/dL (H)). Liver Function Tests: No results for input(s): AST, ALT, ALKPHOS, BILITOT, PROT, ALBUMIN in the last 168 hours. No results for input(s): LIPASE, AMYLASE in the last 168 hours. No results for input(s): AMMONIA in the last 168 hours. Coagulation Profile: No results for input(s): INR, PROTIME in the last 168 hours. Cardiac Enzymes: No results for input(s): CKTOTAL, CKMB, CKMBINDEX, TROPONINI in the last 168 hours. BNP (last 3 results) No results for input(s): PROBNP in the last 8760 hours. HbA1C: No results for input(s): HGBA1C in the last 72 hours. CBG: No results for input(s): GLUCAP in the last 168 hours. Lipid Profile: No results for input(s): CHOL, HDL, LDLCALC, TRIG, CHOLHDL, LDLDIRECT in the last 72 hours. Thyroid Function Tests: No results for input(s): TSH, T4TOTAL, FREET4, T3FREE, THYROIDAB in the last 72 hours. Anemia Panel: No results for input(s): VITAMINB12, FOLATE, FERRITIN, TIBC, IRON, RETICCTPCT in the last 72 hours. Sepsis Labs: No results for input(s): PROCALCITON, LATICACIDVEN in the last 168 hours.  No results found for this or any previous visit (from the past 240 hour(s)).       Radiology Studies: Dg Chest 2 View  Result Date: 03/16/2018 CLINICAL DATA:  Back pain, right leg pain. EXAM:  CHEST - 2 VIEW COMPARISON:  08/24/2010 FINDINGS: Biapical pleural/parenchymal scarring. Heart is normal size. No confluent airspace opacities or effusions. No acute bony abnormality. IMPRESSION: Biapical scarring.  No active disease. Electronically Signed   By: Rolm Baptise M.D.   On: 03/16/2018 08:56   Ct Chest Wo Contrast  Result Date: 03/16/2018 CLINICAL DATA:  Lytic destructive lesion in the right sacrum on CT angiogram performed earlier today. EXAM: CT CHEST WITHOUT CONTRAST TECHNIQUE: Multidetector CT imaging of the chest was performed following the standard protocol without IV contrast. COMPARISON:  03/16/2018 CT angiogram of the abdomen and pelvis with lower extremity runoff. FINDINGS: Cardiovascular: Normal heart size. No significant pericardial effusion/thickening. Three-vessel coronary atherosclerosis. Atherosclerotic nonaneurysmal thoracic aorta. Normal caliber pulmonary arteries. Mediastinum/Nodes: No discrete thyroid nodules. Unremarkable esophagus. No pathologically enlarged axillary, mediastinal or hilar lymph nodes, noting limited sensitivity for the detection of hilar adenopathy on this noncontrast study. Lungs/Pleura: No pneumothorax. No pleural effusion. Mild centrilobular and paraseptal emphysema with diffuse bronchial wall thickening. There is an irregular spiculated 4.3 x 1.6 cm apical right upper lobe lung mass (series 4/image 21) with cavitary change inferiorly. A few clustered small solid pulmonary nodules are noted in the apical left  upper lobe, largest 4 mm (series 4/image 21). There is scattered mild cylindrical bronchiectasis in both lungs, most prominent in the left lower lobe. Upper abdomen: Cholelithiasis. Simple 2.0 cm medial upper left renal cyst. Partially visualized abdominal aortic surgical bypass graft. Musculoskeletal: Lytic destructive expansile 5.0 x 3.1 cm lateral left fourth rib mass (series 4/image 45). No additional focal osseous lesions in the chest. Mild thoracic  spondylosis. IMPRESSION: 1. Partially cavitary irregular spiculated 4.3 x 1.6 cm apical right upper lobe lung mass. Given the suspected osseous metastases, this is suspicious for a primary bronchogenic carcinoma. Multidisciplinary thoracic oncology consultation suggested. PET-CT is suggested for further characterization and staging evaluation. 2. No thoracic adenopathy. 3. Lytic destructive expansile lateral left fourth rib mass most compatible with metastasis. 4. Clustered small solid pulmonary nodules in the apical left upper lobe, largest 4 mm, for which follow-up chest CT is advised in 3 months. 5. Scattered mild cylindrical bronchiectasis in both lungs, most prominent in the left lower lobe. 6. Three-vessel coronary atherosclerosis. 7. Cholelithiasis. Aortic Atherosclerosis (ICD10-I70.0) and Emphysema (ICD10-J43.9). Electronically Signed   By: Ilona Sorrel M.D.   On: 03/16/2018 09:36   Ct Lumbar Spine Wo Contrast  Result Date: 03/16/2018 CLINICAL DATA:  Back pain EXAM: CT LUMBAR SPINE WITHOUT CONTRAST TECHNIQUE: Multidetector CT imaging of the lumbar spine was performed without intravenous contrast administration. Multiplanar CT image reconstructions were also generated. COMPARISON:  CTA 02/19/2011 FINDINGS: Segmentation: Normal Alignment: Mild dextroscoliosis at L3-4.  Normal sagittal alignment. Vertebrae: Destructive mass lesion in the right sacrum measuring 3.5 cm. This was not present 2012 and is most consistent with metastatic disease. No other destructive bone lesions identified. Paraspinal and other soft tissues: Atherosclerotic disease. Vascular findings reported separately from CTA today. No Peri spinal mass or adenopathy. Coronary calcification.  Lung bases clear. Disc levels: T12-L1: Negative L1-2: Negative L2-3: Mild disc bulging and mild facet degeneration. Negative for stenosis L3-4: Asymmetric advanced disc degeneration on the left with disc space narrowing and spurring. Bilateral facet  degeneration. Negative for stenosis L4-5: Moderate disc and facet degeneration.  Mild spinal stenosis. L5-S1: Asymmetric facet degeneration on the right with mild subarticular stenosis on the right due to spurring. IMPRESSION: Destructive mass lesion right sacrum, probable metastatic disease. No other bone lesions Lumbar scoliosis and multilevel degenerative changes above. These results were called by telephone at the time of interpretation on 03/16/2018 at 7:57 am to Dr. Regenia Skeeter, who verbally acknowledged these results. Electronically Signed   By: Franchot Gallo M.D.   On: 03/16/2018 07:57   Ct Angio Aortobifemoral W And/or Wo Contrast  Result Date: 03/16/2018 CLINICAL DATA:  Back pain.  Right leg pain. EXAM: CT ANGIOGRAPHY OF ABDOMINAL AORTA WITH ILIOFEMORAL RUNOFF TECHNIQUE: Multidetector CT imaging of the abdomen, pelvis and lower extremities was performed using the standard protocol during bolus administration of intravenous contrast. Multiplanar CT image reconstructions and MIPs were obtained to evaluate the vascular anatomy. CONTRAST:  147mL ISOVUE-370 IOPAMIDOL (ISOVUE-370) INJECTION 76% COMPARISON:  02/19/2011 FINDINGS: VASCULAR Aorta: Prior aorto bi femoral bypass. Negative aorta is heavily calcified. No aneurysm or dissection. Celiac: Calcified plaque in the proximal celiac artery without significant stenosis. SMA: Calcified plaque throughout the vessel, patent. Renals: Probable ostial stenoses bilaterally. IMA: Fills via collaterals. RIGHT Lower Extremity Inflow: Iliofemoral bypass is patent.  No aneurysm or dissection. Outflow: Heavily diseased/calcified common femoral artery. Runoff: Heavily calcified and diseased superficial femoral artery proximally. This occludes in the proximal to mid superficial femoral artery. There is a reported fem-pop bypass graft  on the right which is not visualized, presumably occluded. Profunda is disease proximally but patent. Reconstitution of the distal popliteal  artery. Proximal trifurcation vessels appear patent but very small and difficult to visualize below the mid calf. LEFT Lower Extremity Inflow: Aortofemoral bypass is patent. Outflow: Common femoral artery is diseased and calcified. Runoff: Occlusion of the left superficial femoral artery proximally. Profunda is patent. Reconstitution of the popliteal artery above the knee with moderate disease. Proximal trifurcation vessels are visualized and patent. Difficult to visualize below the mid calf. Veins: Grossly patent. Review of the MIP images confirms the above findings. NON-VASCULAR Lower chest: No acute abnormality. Hepatobiliary: Small layering gallstones in the gallbladder. No focal hepatic abnormality. Pancreas: No focal abnormality or ductal dilatation. Spleen: No focal abnormality.  Normal size. Adrenals/Urinary Tract: Areas of decreased enhancement and cortical thinning in the lower poles of both kidneys, likely related to renovascular disease. No hydronephrosis or suspicious renal mass. Adrenal glands and urinary bladder unremarkable. Stomach/Bowel: Normal appendix. Left colonic diverticulosis. No active diverticulitis. No evidence of bowel obstruction. Lymphatic: No adenopathy. Reproductive: Prostate enlargement Other: No free fluid or free air. Musculoskeletal: There is a lytic destructive expansile lesion involving much of the right side of the sacrum measuring up to 4.2 cm on image 108. IMPRESSION: VASCULAR Prior aortobifemoral bypass which is patent. Heavily diseased and occluded superficial femoral arteries bilaterally, in the upper thigh on the left and upper to mid thigh on the right. Reconstitution of the right popliteal artery below the knee which is heavily diseased. Proximal trifurcation vessels are patent but difficult to visualize below the mid calf. No visible patent bypass graft. Reconstitution of the left popliteal artery above the knee with moderate disease. Proximal trifurcation vessels are  patent but difficult to visualize below the mid calf. NON-VASCULAR Lytic destructive lesion within the right sacrum concerning for metastasis or myeloma. Enlarged prostate. Left colonic diverticulosis.  No active diverticulitis. Layering gallstones within the gallbladder. These results were called by telephone at the time of interpretation on 03/16/2018 at 8:28 am to Dr. Regenia Skeeter , who verbally acknowledged these results. Electronically Signed   By: Rolm Baptise M.D.   On: 03/16/2018 08:29        Scheduled Meds: . ALPRAZolam  0.5 mg Oral QHS  . atorvastatin  20 mg Oral QHS  . docusate sodium  100 mg Oral BID  . enoxaparin (LOVENOX) injection  40 mg Subcutaneous Q24H  . famotidine  20 mg Oral QHS  . lidocaine  1 patch Transdermal Q24H  . metoprolol succinate  25 mg Oral q1800   Continuous Infusions:   LOS: 1 day    Time spent:35 mins. More than 50% of that time was spent in counseling and/or coordination of care.      Shelly Coss, MD [facility] Pager [phone]  If 7PM-7AM, please contact night-coverage [email or web address] Password [email or web address] 03/17/2018, 1:47 PM

## 2018-03-18 ENCOUNTER — Inpatient Hospital Stay (HOSPITAL_COMMUNITY): Payer: Medicare Other

## 2018-03-18 ENCOUNTER — Ambulatory Visit
Admission: RE | Admit: 2018-03-18 | Discharge: 2018-03-18 | Disposition: A | Discharge status: Home / Self Care | Payer: Medicare Other | Source: Ambulatory Visit | Attending: Radiation Oncology | Admitting: Radiation Oncology

## 2018-03-18 DIAGNOSIS — C7951 Secondary malignant neoplasm of bone: Secondary | ICD-10-CM | POA: Insufficient documentation

## 2018-03-18 DIAGNOSIS — Z510 Encounter for antineoplastic radiation therapy: Secondary | ICD-10-CM | POA: Insufficient documentation

## 2018-03-18 LAB — BASIC METABOLIC PANEL
Anion gap: 8 (ref 5–15)
BUN: 28 mg/dL — ABNORMAL HIGH (ref 8–23)
CO2: 29 mmol/L (ref 22–32)
Calcium: 9.2 mg/dL (ref 8.9–10.3)
Chloride: 98 mmol/L (ref 98–111)
Creatinine, Ser: 1.46 mg/dL — ABNORMAL HIGH (ref 0.61–1.24)
GFR calc Af Amer: 50 mL/min — ABNORMAL LOW (ref >60)
GFR calc non Af Amer: 43 mL/min — ABNORMAL LOW (ref >60)
GLUCOSE: 102 mg/dL — AB (ref 70–99)
Potassium: 5 mmol/L (ref 3.5–5.1)
Sodium: 135 mmol/L (ref 135–145)

## 2018-03-18 LAB — PROTIME-INR
INR: 1.07
Prothrombin Time: 13.8 seconds (ref 11.4–15.2)

## 2018-03-18 MED ORDER — LIDOCAINE HCL 1 % IJ SOLN
INTRAMUSCULAR | Status: AC
  Filled 2018-03-18: qty 20

## 2018-03-18 MED ORDER — HYDROCODONE-ACETAMINOPHEN 5-325 MG PO TABS: 1.0000 | ORAL_TABLET | ORAL | 0 refills | Status: DC | PRN

## 2018-03-18 MED ORDER — GADOBUTROL 1 MMOL/ML IV SOLN
7.5000 mL | Freq: Once | INTRAVENOUS | Status: AC | PRN
  Administered 2018-03-18: 6.5 mL via INTRAVENOUS

## 2018-03-18 MED ORDER — FENTANYL CITRATE (PF) 100 MCG/2ML IJ SOLN
INTRAMUSCULAR | Status: AC
  Filled 2018-03-18: qty 2

## 2018-03-18 MED ORDER — MIDAZOLAM HCL 2 MG/2ML IJ SOLN
INTRAMUSCULAR | Status: AC | PRN
  Administered 2018-03-18: 1 mg via INTRAVENOUS

## 2018-03-18 MED ORDER — MIDAZOLAM HCL 2 MG/2ML IJ SOLN
INTRAMUSCULAR | Status: AC
  Filled 2018-03-18: qty 2

## 2018-03-18 MED ORDER — FENTANYL CITRATE (PF) 100 MCG/2ML IJ SOLN
INTRAMUSCULAR | Status: AC | PRN
  Administered 2018-03-18: 50 ug via INTRAVENOUS

## 2018-03-18 NOTE — Progress Notes (Signed)
Chief Complaint: Patient was seen in consultation today for chest wall mass biopsy at the request of Dr. Curt Bears  Referring Physician(s): *Dr. Curt Bears  Supervising Physician: Markus Daft  Patient Status: [facility] - In-pt  History of Present Illness: Martin [name] is a 85 y.o. male with multiple medical issues. He was admitted with back and chest pain and is being worked up for left chest wall mass concerning for malignant process. Oncology has been consulted and requests biopsy while inpatient. PMHx, meds, labs, imaging, allergies reviewed. Normally on daily Plavix for PVD, has been held since admission. Feels well, no recent fevers, chills, illness. Had some breakfast but has been NPO since about 0830.   Past Medical History:  Diagnosis Date  . Arthritis   . CKD (chronic kidney disease) stage 3, GFR 30-59 ml/min (HCC)   . COPD (chronic obstructive pulmonary disease) ([place])   . Diabetes mellitus    denies, reports resolved with diet  . GERD (gastroesophageal reflux disease)   . H/O: GI bleed   . Hiatal hernia   . Hyperlipidemia   . Hypertension   . Myocardial infarction [facility])    1978 & 2011  . Peripheral vascular disease, unspecified ([place])   . Renal artery stenosis ([place])   . Stroke [facility])     Past Surgical History:  Procedure Laterality Date  . Aortobifemoral BPG  07/22/10   and Right femoral-popliteal BPG    Allergies: Penicillins  Medications:  Current Facility-Administered Medications:  .  acetaminophen (TYLENOL) tablet 650 mg, 650 mg, Oral, Q6H PRN **OR** acetaminophen (TYLENOL) suppository 650 mg, 650 mg, Rectal, Q6H PRN, Karmen Bongo, MD .  albuterol (PROVENTIL) (2.5 MG/3ML) 0.083% nebulizer solution 2.5 mg, 2.5 mg, Nebulization, Q2H PRN, Karmen Bongo, MD .  ALPRAZolam Duanne Moron) tablet 0.5 mg, 0.5 mg, Oral, Ivery Quale, MD, 0.5 mg at 03/17/18 2117 .  atorvastatin (LIPITOR) tablet 20 mg, 20 mg, Oral, QHS, Karmen Bongo, MD, 20  mg at 03/17/18 2117 .  docusate sodium (COLACE) capsule 100 mg, 100 mg, Oral, BID, Karmen Bongo, MD, 100 mg at 03/18/18 1045 .  enoxaparin (LOVENOX) injection 40 mg, 40 mg, Subcutaneous, Q24H, Karmen Bongo, MD, 40 mg at 03/17/18 2053 .  famotidine (PEPCID) tablet 20 mg, 20 mg, Oral, QHS, Karmen Bongo, MD, 20 mg at 03/17/18 2118 .  HYDROcodone-acetaminophen (NORCO/VICODIN) 5-325 MG per tablet 1-2 tablet, 1-2 tablet, Oral, Q4H PRN, Karmen Bongo, MD, 1 tablet at 03/18/18 1045 .  lidocaine (LIDODERM) 5 % 1 patch, 1 patch, Transdermal, Q24H, Karmen Bongo, MD, 1 patch at 03/18/18 1045 .  metoprolol succinate (TOPROL-XL) 24 hr tablet 25 mg, 25 mg, Oral, q1800, Karmen Bongo, MD, 25 mg at 03/17/18 1659 .  ondansetron (ZOFRAN) tablet 4 mg, 4 mg, Oral, Q6H PRN **OR** ondansetron (ZOFRAN) injection 4 mg, 4 mg, Intravenous, Q6H PRN, Karmen Bongo, MD    Family History  Problem Relation Age of Onset  . Cancer Father 60       LIVER CANCER  . Dementia Brother 57  . CAD Brother     Social History   Socioeconomic History  . Marital status: Widowed    Spouse name: Not on file  . Number of children: Not on file  . Years of education: Not on file  . Highest education level: Not on file  Occupational History  . Occupation: retired  Scientific laboratory technician  . Financial resource strain: Not on file  . Food insecurity:    Worry: Not on file  Inability: Not on file  . Transportation needs:    Medical: Not on file    Non-medical: Not on file  Tobacco Use  . Smoking status: Former Smoker    Packs/day: 1.00    Years: 65.00    Pack years: 65.00    Types: Cigarettes    Last attempt to quit: 07/08/2009    Years since quitting: 8.6  . Smokeless tobacco: Never Used  Substance and Sexual Activity  . Alcohol use: No  . Drug use: No  . Sexual activity: Not on file  Lifestyle  . Physical activity:    Days per week: Not on file    Minutes per session: Not on file  . Stress: Not on file    Relationships  . Social connections:    Talks on phone: Not on file    Gets together: Not on file    Attends religious service: Not on file    Active member of club or organization: Not on file    Attends meetings of clubs or organizations: Not on file    Relationship status: Not on file  Other Topics Concern  . Not on file  Social History Narrative  . Not on file     Review of Systems: A 12 point ROS discussed and pertinent positives are indicated in the HPI above.  All other systems are negative.  Review of Systems  Vital Signs: BP 124/67 (BP Location: Left Arm)   Pulse 62   Temp (!) 97.5 F (36.4 C) (Oral)   Resp 14   Ht 5\' 6"  (1.676 m)   Wt 65.3 kg   SpO2 97%   BMI 23.24 kg/m   Physical Exam Constitutional:      Appearance: Normal appearance.  HENT:     Mouth/Throat:     Mouth: Mucous membranes are moist.     Pharynx: Oropharynx is clear.  Cardiovascular:     Rate and Rhythm: Normal rate and regular rhythm.     Heart sounds: Normal heart sounds.  Pulmonary:     Effort: Pulmonary effort is normal. No respiratory distress.     Breath sounds: Normal breath sounds.  Skin:    General: Skin is warm and dry.  Neurological:     General: No focal deficit present.     Mental Status: He is alert and oriented to person, place, and time.  Psychiatric:        Mood and Affect: Mood normal.        Judgment: Judgment normal.     Imaging: Dg Chest 2 View  Result Date: 03/16/2018 CLINICAL DATA:  Back pain, right leg pain. EXAM: CHEST - 2 VIEW COMPARISON:  08/24/2010 FINDINGS: Biapical pleural/parenchymal scarring. Heart is normal size. No confluent airspace opacities or effusions. No acute bony abnormality. IMPRESSION: Biapical scarring.  No active disease. Electronically Signed   By: Rolm Baptise M.D.   On: 03/16/2018 08:56   Ct Chest Wo Contrast  Result Date: 03/16/2018 CLINICAL DATA:  Lytic destructive lesion in the right sacrum on CT angiogram performed earlier  today. EXAM: CT CHEST WITHOUT CONTRAST TECHNIQUE: Multidetector CT imaging of the chest was performed following the standard protocol without IV contrast. COMPARISON:  03/16/2018 CT angiogram of the abdomen and pelvis with lower extremity runoff. FINDINGS: Cardiovascular: Normal heart size. No significant pericardial effusion/thickening. Three-vessel coronary atherosclerosis. Atherosclerotic nonaneurysmal thoracic aorta. Normal caliber pulmonary arteries. Mediastinum/Nodes: No discrete thyroid nodules. Unremarkable esophagus. No pathologically enlarged axillary, mediastinal or hilar lymph nodes, noting limited  sensitivity for the detection of hilar adenopathy on this noncontrast study. Lungs/Pleura: No pneumothorax. No pleural effusion. Mild centrilobular and paraseptal emphysema with diffuse bronchial wall thickening. There is an irregular spiculated 4.3 x 1.6 cm apical right upper lobe lung mass (series 4/image 21) with cavitary change inferiorly. A few clustered small solid pulmonary nodules are noted in the apical left upper lobe, largest 4 mm (series 4/image 21). There is scattered mild cylindrical bronchiectasis in both lungs, most prominent in the left lower lobe. Upper abdomen: Cholelithiasis. Simple 2.0 cm medial upper left renal cyst. Partially visualized abdominal aortic surgical bypass graft. Musculoskeletal: Lytic destructive expansile 5.0 x 3.1 cm lateral left fourth rib mass (series 4/image 45). No additional focal osseous lesions in the chest. Mild thoracic spondylosis. IMPRESSION: 1. Partially cavitary irregular spiculated 4.3 x 1.6 cm apical right upper lobe lung mass. Given the suspected osseous metastases, this is suspicious for a primary bronchogenic carcinoma. Multidisciplinary thoracic oncology consultation suggested. PET-CT is suggested for further characterization and staging evaluation. 2. No thoracic adenopathy. 3. Lytic destructive expansile lateral left fourth rib mass most compatible  with metastasis. 4. Clustered small solid pulmonary nodules in the apical left upper lobe, largest 4 mm, for which follow-up chest CT is advised in 3 months. 5. Scattered mild cylindrical bronchiectasis in both lungs, most prominent in the left lower lobe. 6. Three-vessel coronary atherosclerosis. 7. Cholelithiasis. Aortic Atherosclerosis (ICD10-I70.0) and Emphysema (ICD10-J43.9). Electronically Signed   By: Ilona Sorrel M.D.   On: 03/16/2018 09:36   Ct Lumbar Spine Wo Contrast  Result Date: 03/16/2018 CLINICAL DATA:  Back pain EXAM: CT LUMBAR SPINE WITHOUT CONTRAST TECHNIQUE: Multidetector CT imaging of the lumbar spine was performed without intravenous contrast administration. Multiplanar CT image reconstructions were also generated. COMPARISON:  CTA 02/19/2011 FINDINGS: Segmentation: Normal Alignment: Mild dextroscoliosis at L3-4.  Normal sagittal alignment. Vertebrae: Destructive mass lesion in the right sacrum measuring 3.5 cm. This was not present 2012 and is most consistent with metastatic disease. No other destructive bone lesions identified. Paraspinal and other soft tissues: Atherosclerotic disease. Vascular findings reported separately from CTA today. No Peri spinal mass or adenopathy. Coronary calcification.  Lung bases clear. Disc levels: T12-L1: Negative L1-2: Negative L2-3: Mild disc bulging and mild facet degeneration. Negative for stenosis L3-4: Asymmetric advanced disc degeneration on the left with disc space narrowing and spurring. Bilateral facet degeneration. Negative for stenosis L4-5: Moderate disc and facet degeneration.  Mild spinal stenosis. L5-S1: Asymmetric facet degeneration on the right with mild subarticular stenosis on the right due to spurring. IMPRESSION: Destructive mass lesion right sacrum, probable metastatic disease. No other bone lesions Lumbar scoliosis and multilevel degenerative changes above. These results were called by telephone at the time of interpretation on  03/16/2018 at 7:57 am to Dr. Regenia Skeeter, who verbally acknowledged these results. Electronically Signed   By: Franchot Gallo M.D.   On: 03/16/2018 07:57   Ct Angio Aortobifemoral W And/or Wo Contrast  Result Date: 03/16/2018 CLINICAL DATA:  Back pain.  Right leg pain. EXAM: CT ANGIOGRAPHY OF ABDOMINAL AORTA WITH ILIOFEMORAL RUNOFF TECHNIQUE: Multidetector CT imaging of the abdomen, pelvis and lower extremities was performed using the standard protocol during bolus administration of intravenous contrast. Multiplanar CT image reconstructions and MIPs were obtained to evaluate the vascular anatomy. CONTRAST:  157mL ISOVUE-370 IOPAMIDOL (ISOVUE-370) INJECTION 76% COMPARISON:  02/19/2011 FINDINGS: VASCULAR Aorta: Prior aorto bi femoral bypass. Negative aorta is heavily calcified. No aneurysm or dissection. Celiac: Calcified plaque in the proximal celiac artery without significant  stenosis. SMA: Calcified plaque throughout the vessel, patent. Renals: Probable ostial stenoses bilaterally. IMA: Fills via collaterals. RIGHT Lower Extremity Inflow: Iliofemoral bypass is patent.  No aneurysm or dissection. Outflow: Heavily diseased/calcified common femoral artery. Runoff: Heavily calcified and diseased superficial femoral artery proximally. This occludes in the proximal to mid superficial femoral artery. There is a reported fem-pop bypass graft on the right which is not visualized, presumably occluded. Profunda is disease proximally but patent. Reconstitution of the distal popliteal artery. Proximal trifurcation vessels appear patent but very small and difficult to visualize below the mid calf. LEFT Lower Extremity Inflow: Aortofemoral bypass is patent. Outflow: Common femoral artery is diseased and calcified. Runoff: Occlusion of the left superficial femoral artery proximally. Profunda is patent. Reconstitution of the popliteal artery above the knee with moderate disease. Proximal trifurcation vessels are visualized and  patent. Difficult to visualize below the mid calf. Veins: Grossly patent. Review of the MIP images confirms the above findings. NON-VASCULAR Lower chest: No acute abnormality. Hepatobiliary: Small layering gallstones in the gallbladder. No focal hepatic abnormality. Pancreas: No focal abnormality or ductal dilatation. Spleen: No focal abnormality.  Normal size. Adrenals/Urinary Tract: Areas of decreased enhancement and cortical thinning in the lower poles of both kidneys, likely related to renovascular disease. No hydronephrosis or suspicious renal mass. Adrenal glands and urinary bladder unremarkable. Stomach/Bowel: Normal appendix. Left colonic diverticulosis. No active diverticulitis. No evidence of bowel obstruction. Lymphatic: No adenopathy. Reproductive: Prostate enlargement Other: No free fluid or free air. Musculoskeletal: There is a lytic destructive expansile lesion involving much of the right side of the sacrum measuring up to 4.2 cm on image 108. IMPRESSION: VASCULAR Prior aortobifemoral bypass which is patent. Heavily diseased and occluded superficial femoral arteries bilaterally, in the upper thigh on the left and upper to mid thigh on the right. Reconstitution of the right popliteal artery below the knee which is heavily diseased. Proximal trifurcation vessels are patent but difficult to visualize below the mid calf. No visible patent bypass graft. Reconstitution of the left popliteal artery above the knee with moderate disease. Proximal trifurcation vessels are patent but difficult to visualize below the mid calf. NON-VASCULAR Lytic destructive lesion within the right sacrum concerning for metastasis or myeloma. Enlarged prostate. Left colonic diverticulosis.  No active diverticulitis. Layering gallstones within the gallbladder. These results were called by telephone at the time of interpretation on 03/16/2018 at 8:28 am to Dr. Regenia Skeeter , who verbally acknowledged these results. Electronically Signed    By: Rolm Baptise M.D.   On: 03/16/2018 08:29    Labs:  CBC: Recent Labs    03/16/18 0532 03/17/18 0215  WBC 9.5 9.1  HGB 14.1 13.6  HCT 43.5 42.0  PLT 191 215    COAGS: Recent Labs    03/18/18 1043  INR 1.07    BMP: Recent Labs    03/16/18 0532 03/17/18 0215 03/18/18 0238  NA 139 137 135  K 4.0 4.4 5.0  CL 101 101 98  CO2 26 29 29   GLUCOSE 131* 105* 102*  BUN 26* 29* 28*  CALCIUM 9.4 9.3 9.2  CREATININE 1.49* 1.57* 1.46*  GFRNONAA 42* 40* 43*  GFRAA 49* 46* 50*    LIVER FUNCTION TESTS: No results for input(s): BILITOT, AST, ALT, ALKPHOS, PROT, ALBUMIN in the last 8760 hours.  TUMOR MARKERS: No results for input(s): AFPTM, CEA, CA199, CHROMGRNA in the last 8760 hours.  Assessment and Plan: (L)chest wall/4th rib mass Pulmonary and Oncology recommend proceeding with image guided biopsy. Imaging  reviewed with Dr. Anselm Pancoast. Pt has been off Plavix approx 3 days, so there is still elevated risk of bleeding with procedure. Ate breakfast, but NPO since 0830 Can try to proceed today if schedule permits. Risks and benefits discussed with the patient including, but not limited to bleeding, hemoptysis, respiratory failure requiring intubation, infection, pneumothorax requiring chest tube placement, stroke from air embolism or even death.  All of the patient's questions were answered, patient is agreeable to proceed. Consent signed and in chart.    Thank you for this interesting consult.  I greatly enjoyed meeting Martin [name] and look forward to participating in their care.  A copy of this report was sent to the requesting provider on this date.  Electronically Signed: Ascencion Dike, PA-C 03/18/2018, 11:33 AM   I spent a total of 20 minutes in face to face in clinical consultation, greater than 50% of which was counseling/coordinating care for left chest wall mass biopsy

## 2018-03-18 NOTE — Consult Note (Signed)
Radiation Oncology         [phone] ________________________________  Initial inpatient Consultation  Name: Martin [name] MRN: [id number]  Date of Service: 03/15/2018 DOB: 19-Mar-1930  [place]:[id number], Martin Mcgee, MD  No ref. provider found   REFERRING PHYSICIAN: Dr. Tawanna [name]  DIAGNOSIS: The primary encounter diagnosis was Midline back pain, unspecified back location, unspecified chronicity. Diagnoses of Sacral mass, Lung mass, Metastatic lung cancer (metastasis from lung to other site) [facility]), Metastasis ([place]), and Rib lesion were also pertinent to this visit.    ICD-10-CM   1. Midline back pain, unspecified back location, unspecified chronicity M54.89   2. Sacral mass R22.2   3. Lung mass R91.8   4. Metastatic lung cancer (metastasis from lung to other site) ([place]) C34.90 CANCELED: IR Radiologist Eval & Mgmt    CANCELED: IR Radiologist Eval & Mgmt  5. Metastasis ([place]) C79.9 CT guided needle placement    CT guided needle placement    CANCELED: IR Radiologist Eval & Mgmt    CANCELED: IR Radiologist Eval & Mgmt  6. Rib lesion M89.9 DG Chest Port 1 View    DG Chest Port 1 View    HISTORY OF PRESENT ILLNESS: Martin [name] is a 85 y.o. male seen at the request of Dr. Tawanna [name].  He recently presented to the emergency department on 03/16/2018 due to severe low back pain radiating into the right lower extremity present for the past 6 weeks and progressively worsening.  The pain had gotten to the point where it was unbearable and keeping him up at night.  A CT of the lumbar spine was performed on admission and demonstrated a 4.2 cm destructive mass lesion at the right sacrum, highly suspicious for metastatic disease.  There were no other bony lesions noted.  Having no prior history of malignancy, he proceeded with a CT chest for further disease staging and this demonstrated a 4.3 cm spiculated apical right upper lobe mass as well as a 5 cm lytic, destructive, expansile mass in the lateral left fourth  rib.  No other bony lesions were noted in the chest.  There was no suspicious lymphadenopathy but this exam was limited due to the lack of IV contrast secondary to his chronic kidney disease.   Today, he reports his pain is much better controlled since the time of admission.  He denies numbness or tingling in the lower extremities, focal weakness or bowel/bladder incontinence.  He had an MRI of the brain for further disease staging and this was without evidence of intracranial metastatic disease.  We are asked to consult for consideration of palliative radiotherapy to the painful metastatic lesion in the right sacrum.  PREVIOUS RADIATION THERAPY: No  PAST MEDICAL HISTORY:  Past Medical History:  Diagnosis Date  . Arthritis   . CKD (chronic kidney disease) stage 3, GFR 30-59 ml/min ([facility])   . COPD (chronic obstructive pulmonary disease) ([place])   . Diabetes mellitus    denies, reports resolved with diet  . GERD (gastroesophageal reflux disease)   . H/O: GI bleed   . Hiatal hernia   . Hyperlipidemia   . Hypertension   . Myocardial infarction [facility])    1978 & 2011  . Peripheral vascular disease, unspecified ([place])   . Renal artery stenosis ([place])   . Stroke [facility])       PAST SURGICAL HISTORY: Past Surgical History:  Procedure Laterality Date  . Aortobifemoral BPG  07/22/10   and Right femoral-popliteal BPG    FAMILY HISTORY:  Family History  Problem Relation Age of Onset  . Cancer Father 70       LIVER CANCER  . Dementia Brother 83  . CAD Brother     SOCIAL HISTORY:  Social History   Socioeconomic History  . Marital status: Widowed    Spouse name: Not on file  . Number of children: Not on file  . Years of education: Not on file  . Highest education level: Not on file  Occupational History  . Occupation: retired  Scientific laboratory technician  . Financial resource strain: Not on file  . Food insecurity:    Worry: Not on file    Inability: Not on file  . Transportation needs:     Medical: Not on file    Non-medical: Not on file  Tobacco Use  . Smoking status: Former Smoker    Packs/day: 1.00    Years: 65.00    Pack years: 65.00    Types: Cigarettes    Last attempt to quit: 07/08/2009    Years since quitting: 8.7  . Smokeless tobacco: Never Used  Substance and Sexual Activity  . Alcohol use: No  . Drug use: No  . Sexual activity: Not on file  Lifestyle  . Physical activity:    Days per week: Not on file    Minutes per session: Not on file  . Stress: Not on file  Relationships  . Social connections:    Talks on phone: Not on file    Gets together: Not on file    Attends religious service: Not on file    Active member of club or organization: Not on file    Attends meetings of clubs or organizations: Not on file    Relationship status: Not on file  . Intimate partner violence:    Fear of current or ex partner: Not on file    Emotionally abused: Not on file    Physically abused: Not on file    Forced sexual activity: Not on file  Other Topics Concern  . Not on file  Social History Narrative  . Not on file    ALLERGIES: Penicillins  MEDICATIONS:  Current Facility-Administered Medications  Medication Dose Route Frequency Provider Last Rate Last Dose  . acetaminophen (TYLENOL) tablet 650 mg  650 mg Oral Q6H PRN Karmen Bongo, MD       Or  . acetaminophen (TYLENOL) suppository 650 mg  650 mg Rectal Q6H PRN Karmen Bongo, MD      . albuterol (PROVENTIL) (2.5 MG/3ML) 0.083% nebulizer solution 2.5 mg  2.5 mg Nebulization Q2H PRN Karmen Bongo, MD      . ALPRAZolam Duanne Moron) tablet 0.5 mg  0.5 mg Oral Ivery Quale, MD   0.5 mg at 03/18/18 2207  . atorvastatin (LIPITOR) tablet 20 mg  20 mg Oral QHS Karmen Bongo, MD   20 mg at 03/18/18 2204  . docusate sodium (COLACE) capsule 100 mg  100 mg Oral BID Karmen Bongo, MD   Stopped at 03/19/18 1000  . enoxaparin (LOVENOX) injection 40 mg  40 mg Subcutaneous Q24H Karmen Bongo, MD   40 mg at  03/18/18 1956  . famotidine (PEPCID) tablet 20 mg  20 mg Oral QHS Karmen Bongo, MD   20 mg at 03/18/18 2205  . HYDROcodone-acetaminophen (NORCO/VICODIN) 5-325 MG per tablet 1-2 tablet  1-2 tablet Oral Q4H PRN Karmen Bongo, MD   2 tablet at 03/19/18 0516  . lidocaine (LIDODERM) 5 % 1 patch  1 patch Transdermal Q24H  Karmen Bongo, MD   1 patch at 03/18/18 1045  . metoprolol succinate (TOPROL-XL) 24 hr tablet 25 mg  25 mg Oral q1800 Karmen Bongo, MD   25 mg at 03/18/18 1804  . ondansetron (ZOFRAN) tablet 4 mg  4 mg Oral Q6H PRN Karmen Bongo, MD   4 mg at 03/18/18 1956   Or  . ondansetron [facility]) injection 4 mg  4 mg Intravenous Q6H PRN Karmen Bongo, MD        REVIEW OF SYSTEMS:  On review of systems, the patient reports that he is doing well overall.  His low back pain is relatively well controlled at this point.  He denies radiation of pain and/or paraesthesias into the LEs.  He denies any chest pain, shortness of breath, cough, fevers, chills, night sweats, unintended weight changes.  He denies any bowel or bladder disturbances, and denies abdominal pain, nausea or vomiting.  He denies any new musculoskeletal or joint aches or pains. A complete review of systems is obtained and is otherwise negative.  PHYSICAL EXAM:  Wt Readings from Last 3 Encounters:  03/17/18 144 lb (65.3 kg)  05/08/17 146 lb (66.2 kg)  05/01/17 149 lb (67.6 kg)   Temp Readings from Last 3 Encounters:  03/19/18 97.9 F (36.6 C) (Oral)  05/08/17 98.3 F (36.8 C) (Oral)  05/01/17 97.7 F (36.5 C) (Oral)   BP Readings from Last 3 Encounters:  03/19/18 (!) 111/59  05/08/17 138/74  05/01/17 138/76   Pulse Readings from Last 3 Encounters:  03/19/18 78  05/08/17 70  05/01/17 68   Pain Assessment Pain Score: 0-No pain/10  In general this is a well appearing Caucasian male in no acute distress.  He is resting comfortably in bed and alert and oriented x4 and appropriate throughout the examination.  HEENT reveals that the patient is normocephalic, atraumatic. EOMs are intact. PERRLA. Skin is intact without any evidence of gross lesions. Cardiovascular exam reveals a regular rate and rhythm, no clicks rubs or murmurs are auscultated. Chest is clear to auscultation bilaterally. Lymphatic assessment is performed and does not reveal any adenopathy in the cervical, supraclavicular, axillary, or inguinal chains. Abdomen has active bowel sounds in all quadrants and is intact. The abdomen is soft, non tender, non distended. Lower extremities are negative for pretibial pitting edema, deep calf tenderness, cyanosis or clubbing.  Strength is 5/5 and equal in the bilateral LEs.  Sensation is intact to light touch bilaterally.   KPS = 80  100 - Normal; no complaints; no evidence of disease. 90   - Able to carry on normal activity; minor signs or symptoms of disease. 80   - Normal activity with effort; some signs or symptoms of disease. 59   - Cares for self; unable to carry on normal activity or to do active work. 60   - Requires occasional assistance, but is able to care for most of his personal needs. 50   - Requires considerable assistance and frequent medical care. 41   - Disabled; requires special care and assistance. 13   - Severely disabled; hospital admission is indicated although death not imminent. 89   - Very sick; hospital admission necessary; active supportive treatment necessary. 10   - Moribund; fatal processes progressing rapidly. 0     - Dead  Karnofsky DA, Abelmann WH, Craver LS and Burchenal [facility] [phone]) The use of the nitrogen mustards in the palliative treatment of carcinoma: with particular reference to bronchogenic carcinoma Cancer 1 634-56  LABORATORY  DATA:  Lab Results  Component Value Date   WBC 9.1 03/17/2018   HGB 13.6 03/17/2018   HCT 42.0 03/17/2018   MCV 98.8 03/17/2018   PLT 215 03/17/2018   Lab Results  Component Value Date   NA 135 03/18/2018   K 5.0 03/18/2018     CL 98 03/18/2018   CO2 29 03/18/2018   Lab Results  Component Value Date   ALT 20 01/23/2017   AST 24 01/23/2017   ALKPHOS 69 09/27/2015   BILITOT 0.9 01/23/2017     RADIOGRAPHY: Dg Chest 2 View  Result Date: 03/16/2018 CLINICAL DATA:  Back pain, right leg pain. EXAM: CHEST - 2 VIEW COMPARISON:  08/24/2010 FINDINGS: Biapical pleural/parenchymal scarring. Heart is normal size. No confluent airspace opacities or effusions. No acute bony abnormality. IMPRESSION: Biapical scarring.  No active disease. Electronically Signed   By: Rolm Baptise M.D.   On: 03/16/2018 08:56   Ct Chest Wo Contrast  Result Date: 03/16/2018 CLINICAL DATA:  Lytic destructive lesion in the right sacrum on CT angiogram performed earlier today. EXAM: CT CHEST WITHOUT CONTRAST TECHNIQUE: Multidetector CT imaging of the chest was performed following the standard protocol without IV contrast. COMPARISON:  03/16/2018 CT angiogram of the abdomen and pelvis with lower extremity runoff. FINDINGS: Cardiovascular: Normal heart size. No significant pericardial effusion/thickening. Three-vessel coronary atherosclerosis. Atherosclerotic nonaneurysmal thoracic aorta. Normal caliber pulmonary arteries. Mediastinum/Nodes: No discrete thyroid nodules. Unremarkable esophagus. No pathologically enlarged axillary, mediastinal or hilar lymph nodes, noting limited sensitivity for the detection of hilar adenopathy on this noncontrast study. Lungs/Pleura: No pneumothorax. No pleural effusion. Mild centrilobular and paraseptal emphysema with diffuse bronchial wall thickening. There is an irregular spiculated 4.3 x 1.6 cm apical right upper lobe lung mass (series 4/image 21) with cavitary change inferiorly. A few clustered small solid pulmonary nodules are noted in the apical left upper lobe, largest 4 mm (series 4/image 21). There is scattered mild cylindrical bronchiectasis in both lungs, most prominent in the left lower lobe. Upper abdomen:  Cholelithiasis. Simple 2.0 cm medial upper left renal cyst. Partially visualized abdominal aortic surgical bypass graft. Musculoskeletal: Lytic destructive expansile 5.0 x 3.1 cm lateral left fourth rib mass (series 4/image 45). No additional focal osseous lesions in the chest. Mild thoracic spondylosis. IMPRESSION: 1. Partially cavitary irregular spiculated 4.3 x 1.6 cm apical right upper lobe lung mass. Given the suspected osseous metastases, this is suspicious for a primary bronchogenic carcinoma. Multidisciplinary thoracic oncology consultation suggested. PET-CT is suggested for further characterization and staging evaluation. 2. No thoracic adenopathy. 3. Lytic destructive expansile lateral left fourth rib mass most compatible with metastasis. 4. Clustered small solid pulmonary nodules in the apical left upper lobe, largest 4 mm, for which follow-up chest CT is advised in 3 months. 5. Scattered mild cylindrical bronchiectasis in both lungs, most prominent in the left lower lobe. 6. Three-vessel coronary atherosclerosis. 7. Cholelithiasis. Aortic Atherosclerosis (ICD10-I70.0) and Emphysema (ICD10-J43.9). Electronically Signed   By: Ilona Sorrel M.D.   On: 03/16/2018 09:36   Ct Lumbar Spine Wo Contrast  Result Date: 03/16/2018 CLINICAL DATA:  Back pain EXAM: CT LUMBAR SPINE WITHOUT CONTRAST TECHNIQUE: Multidetector CT imaging of the lumbar spine was performed without intravenous contrast administration. Multiplanar CT image reconstructions were also generated. COMPARISON:  CTA 02/19/2011 FINDINGS: Segmentation: Normal Alignment: Mild dextroscoliosis at L3-4.  Normal sagittal alignment. Vertebrae: Destructive mass lesion in the right sacrum measuring 3.5 cm. This was not present 2012 and is most consistent with metastatic disease. No other  destructive bone lesions identified. Paraspinal and other soft tissues: Atherosclerotic disease. Vascular findings reported separately from CTA today. No Peri spinal mass or  adenopathy. Coronary calcification.  Lung bases clear. Disc levels: T12-L1: Negative L1-2: Negative L2-3: Mild disc bulging and mild facet degeneration. Negative for stenosis L3-4: Asymmetric advanced disc degeneration on the left with disc space narrowing and spurring. Bilateral facet degeneration. Negative for stenosis L4-5: Moderate disc and facet degeneration.  Mild spinal stenosis. L5-S1: Asymmetric facet degeneration on the right with mild subarticular stenosis on the right due to spurring. IMPRESSION: Destructive mass lesion right sacrum, probable metastatic disease. No other bone lesions Lumbar scoliosis and multilevel degenerative changes above. These results were called by telephone at the time of interpretation on 03/16/2018 at 7:57 am to Dr. Regenia Skeeter, who verbally acknowledged these results. Electronically Signed   By: Franchot Gallo M.D.   On: 03/16/2018 07:57   Ct Guided Needle Placement  Result Date: 03/18/2018 INDICATION: 85 year old with destructive bone lesions in the right sacrum and left fourth rib. There is also a suspicious lesion in the right lung. Findings are concerning for metastatic disease. Tissue diagnosis is needed. EXAM: CT-GUIDED CORE BIOPSY OF LEFT FOURTH RIB LESION MEDICATIONS: None. ANESTHESIA/SEDATION: Moderate (conscious) sedation was employed during this procedure. A total of Versed 1.0 mg and Fentanyl 50 mcg was administered intravenously. Moderate Sedation Time: 21 minutes. The patient's level of consciousness and vital signs were monitored continuously by radiology nursing throughout the procedure under my direct supervision. FLUOROSCOPY TIME:  None COMPLICATIONS: None immediate. PROCEDURE: Informed written consent was obtained from the patient after a thorough discussion of the procedural risks, benefits and alternatives. All questions were addressed. A timeout was performed prior to the initiation of the procedure. Patient was placed supine on the CT scanner with the left  arm elevated. Images through the chest were obtained. The destructive lesion involving the lateral left chest wall and left fourth rib was identified. The left axilla was prepped with chlorhexidine and sterile field was created. Skin and soft tissues were anesthetized with 1% lidocaine. 17 gauge coaxial needle directed into the rib lesion using CT guidance. Total of 5 core biopsies were obtained with an 18 gauge core device. Small pieces of core biopsy were obtained and placed in formalin. Needle was removed without complication. Bandage placed over the puncture site. FINDINGS: Destructive soft tissue lesion along the lateral left chest wall involving the left fourth rib. Needle position confirmed within the lesion. Small amount of core biopsy material was obtained. IMPRESSION: CT-guided core biopsies of the left chest wall/fourth rib lesion. Electronically Signed   By: Markus Daft M.D.   On: 03/18/2018 16:10   Mr Jeri Cos KG Contrast  Result Date: 03/18/2018 CLINICAL DATA:  Newly discovered lung mass.  Lung cancer staging. EXAM: MRI HEAD WITHOUT AND WITH CONTRAST TECHNIQUE: Multiplanar, multiecho pulse sequences of the brain and surrounding structures were obtained without and with intravenous contrast. CONTRAST:  6.5 mL Gadavist COMPARISON:  Head CT 07/18/2015 and MRI 07/15/2015 FINDINGS: Brain: There is no evidence of acute infarct, midline shift, or extra-axial fluid collection. A large chronic right cerebellar infarct is noted with minimal chronic blood products. Chronic left basal ganglia, pontine, and right thalamic lacunar infarcts are again noted. Small foci of T2 hyperintensity in the cerebral white matter bilaterally and pons are nonspecific but compatible with mild chronic small vessel ischemic disease at most slightly progressed since the prior MRI. There is minimal non masslike enhancement associated with the chronic right  cerebellar infarct. No abnormal brain parenchymal or meningeal enhancement  suggestive of metastatic disease is identified. Cerebral atrophy is mild for age. Vascular: Major intracranial vascular flow voids are preserved. Skull and upper cervical spine: No suspicious marrow lesion. Moderate ligamentous hypertrophy posterior to the dens. Sinuses/Orbits: Bilateral cataract extraction. Clear paranasal sinuses. Trace mastoid effusions. Other: None. IMPRESSION: 1. No evidence of intracranial metastatic disease. 2. Chronic ischemic changes including a large old cerebellar infarct as detailed above. Electronically Signed   By: Logan Bores M.D.   On: 03/18/2018 13:55   Ct Angio Aortobifemoral W And/or Wo Contrast  Result Date: 03/16/2018 CLINICAL DATA:  Back pain.  Right leg pain. EXAM: CT ANGIOGRAPHY OF ABDOMINAL AORTA WITH ILIOFEMORAL RUNOFF TECHNIQUE: Multidetector CT imaging of the abdomen, pelvis and lower extremities was performed using the standard protocol during bolus administration of intravenous contrast. Multiplanar CT image reconstructions and MIPs were obtained to evaluate the vascular anatomy. CONTRAST:  131mL ISOVUE-370 IOPAMIDOL (ISOVUE-370) INJECTION 76% COMPARISON:  02/19/2011 FINDINGS: VASCULAR Aorta: Prior aorto bi femoral bypass. Negative aorta is heavily calcified. No aneurysm or dissection. Celiac: Calcified plaque in the proximal celiac artery without significant stenosis. SMA: Calcified plaque throughout the vessel, patent. Renals: Probable ostial stenoses bilaterally. IMA: Fills via collaterals. RIGHT Lower Extremity Inflow: Iliofemoral bypass is patent.  No aneurysm or dissection. Outflow: Heavily diseased/calcified common femoral artery. Runoff: Heavily calcified and diseased superficial femoral artery proximally. This occludes in the proximal to mid superficial femoral artery. There is a reported fem-pop bypass graft on the right which is not visualized, presumably occluded. Profunda is disease proximally but patent. Reconstitution of the distal popliteal artery.  Proximal trifurcation vessels appear patent but very small and difficult to visualize below the mid calf. LEFT Lower Extremity Inflow: Aortofemoral bypass is patent. Outflow: Common femoral artery is diseased and calcified. Runoff: Occlusion of the left superficial femoral artery proximally. Profunda is patent. Reconstitution of the popliteal artery above the knee with moderate disease. Proximal trifurcation vessels are visualized and patent. Difficult to visualize below the mid calf. Veins: Grossly patent. Review of the MIP images confirms the above findings. NON-VASCULAR Lower chest: No acute abnormality. Hepatobiliary: Small layering gallstones in the gallbladder. No focal hepatic abnormality. Pancreas: No focal abnormality or ductal dilatation. Spleen: No focal abnormality.  Normal size. Adrenals/Urinary Tract: Areas of decreased enhancement and cortical thinning in the lower poles of both kidneys, likely related to renovascular disease. No hydronephrosis or suspicious renal mass. Adrenal glands and urinary bladder unremarkable. Stomach/Bowel: Normal appendix. Left colonic diverticulosis. No active diverticulitis. No evidence of bowel obstruction. Lymphatic: No adenopathy. Reproductive: Prostate enlargement Other: No free fluid or free air. Musculoskeletal: There is a lytic destructive expansile lesion involving much of the right side of the sacrum measuring up to 4.2 cm on image 108. IMPRESSION: VASCULAR Prior aortobifemoral bypass which is patent. Heavily diseased and occluded superficial femoral arteries bilaterally, in the upper thigh on the left and upper to mid thigh on the right. Reconstitution of the right popliteal artery below the knee which is heavily diseased. Proximal trifurcation vessels are patent but difficult to visualize below the mid calf. No visible patent bypass graft. Reconstitution of the left popliteal artery above the knee with moderate disease. Proximal trifurcation vessels are patent  but difficult to visualize below the mid calf. NON-VASCULAR Lytic destructive lesion within the right sacrum concerning for metastasis or myeloma. Enlarged prostate. Left colonic diverticulosis.  No active diverticulitis. Layering gallstones within the gallbladder. These results were called by telephone  at the time of interpretation on 03/16/2018 at 8:28 am to Dr. Regenia Skeeter , who verbally acknowledged these results. Electronically Signed   By: Rolm Baptise M.D.   On: 03/16/2018 08:29   Dg Chest Port 1 View  Result Date: 03/18/2018 CLINICAL DATA:  Rib lesion EXAM: PORTABLE CHEST 1 VIEW COMPARISON:  03/16/2018 FINDINGS: The destructive mass involving the left fourth rib is stable. Lungs are otherwise clear. Normal heart size. No pneumothorax. IMPRESSION: No pneumothorax post left rib lesion biopsy. Electronically Signed   By: Marybelle Killings M.D.   On: 03/18/2018 17:12      IMPRESSION/PLAN: 1. 85 y.o. with painful bony metastatic disease in the right sacrum, highly suspicious for metastatic lung cancer pending tissue diagnosis and further staging work-up. Today, I talked to the patient and family about the findings and workup thus far.  Dr. Tammi Klippel was involved in the thorough case review, personal review of imaging and formulation of the treatment plan.  The patient is scheduled for a CT core needle biopsy of the left chest wall mass later today.  We discussed the natural history of metastatic carcinoma and general treatment, highlighting the role of palliative radiotherapy in the management of painful bony metastases. We discussed the available radiation techniques, and focused on the details of logistics and delivery.  Pending pathology confirmation, the recommendation is to proceed with a course of palliative radiotherapy to the painful right sacral metastasis, delivered in 10 daily treatments over a period of 2 weeks.  He understands that the final pathology could potentially affect/change our treatment  recommendation but will not prevent the ability to move forward with initiating radiotherapy to the right sacral lesion for pain control.  We reviewed the anticipated acute and late sequelae associated with radiation in this setting. The patient was encouraged to ask questions that were answered to his satisfaction.  At the conclusion of our conversation, the patient elects to proceed with palliative radiotherapy to the right sacral lesion as recommended.  He is tentatively scheduled for CT simulation/treatment planning at 9 AM on Friday, 03/19/2018.  We have arranged for [facility] transportation to transport him over to the cancer center for planning appointment and then back to [facility] hospital for discharge.  We anticipate beginning radiotherapy later that same day, preferably after discharge, to save an additional trip via [facility] but he understands that the timing for discharge will be at the discretion of his hospitalist.   We enjoyed meeting this patient and look forward to participating in his care.   Nicholos Johns, PA-C    Tyler Pita, MD  [facility] Direct Dial: [phone]  Fax: [phone] [email or web address]  Skype  LinkedIn

## 2018-03-18 NOTE — Sedation Documentation (Signed)
PT mouth breathing unable to accurately monitor ETCO2.

## 2018-03-18 NOTE — Care Management Note (Signed)
Case Management Note  Patient Details  Name: PAULINO CORK MRN: [id number] Date of Birth: August 29, 1930  Subjective/Objective:   From home alone, patient will benefit from the [place] with [place], [place] made referral to [facility] with Alvis Lemmings.  Patient will be going to [facility] in am for bx and will return back to [facility] per RN.  Tommi Rumps will call patient and speak to him over the phone.                 Action/Plan: NCM will follow for transition of care needs.   Expected Discharge Date:                  Expected Discharge Plan:  [place]  In-House Referral:     Discharge planning Services  CM Consult  Post Acute Care Choice:  Home Health Choice offered to:  Patient  DME Arranged:    DME Agency:     HH Arranged:    [place] Agency:     Status of Service:  In process, will continue to follow  If discussed at Long Length of Stay Meetings, dates discussed:    Additional Comments:  Zenon Mayo, RN 03/18/2018, 4:27 PM

## 2018-03-18 NOTE — Discharge Summary (Signed)
Physician Discharge Summary  Martin [name] BZJ:[id number] DOB: 02-Apr-1930 DOA: 03/15/2018  PCP: Susy Frizzle, MD  Admit date: 03/15/2018 Discharge date: 03/18/2018  Admitted From: Home Disposition:  Home  Discharge Condition:Stable CODE STATUS:FULL Diet recommendation: Heart Healthy   Brief/Interim Summary: Patient is 85 year old male with past medical history of CVA, peripheral vascular disease with renal artery stenosis,  Coronary artery disease with stents, hyperlipidemia, GERD, diabetes, COPD who presents with  complaint of back pain.  He was bothered with back pain  for last 6 weeks.  Imagings done here on presentation showed cavitary spiculated 4.3x 1.6 cm left apical right upper lobe mass.  Also showed lytic destruction in the right sacrum suggestive of metastatic disease. He was also found to have left fourth rib mass highly suspicious for metastatic disease.  Pulmonology, oncology and IR consulted.IR  did chest wall mass biopsy today.  Radiation oncology also consulted for radiation therapy for the sacral metastatic lesion.He will be undergoing simulation for radiation therapy early morning tomorrow and will be discharged home. He will follow up with oncology and radiation oncology as an outpatient.  Following problems were addressed during his hospitalization:  Metastatic lung cancer: Presented with right-sided sacrum pain, progressive inability to walk for the last 6 weeks.  CT of the lumbar spine showed metastatic destructive lesion on the area of pain.  CT chest showed partially cavitary irregular spiculated 4.3x 1.6 cm left apical right upper lobe mass suspicious for primary bronchogenic carcinoma.  Also found to have lateral left fourth rib mass which is apparently asymptomatic. Oncology consulted.  Will be followed by oncology as an outpatient for PET scan and further management. Pulmonary evaluated the patient and concluded that bronchoscopic approach will be difficult for the  lung mass. IR  consulted for biopsy of the chest wall mass.  Underwent biopsy on 03/17/18. We have also consulted with radiation oncology. Marland KitchenHe will be undergoing simulation for radiation therapy early morning tomorrow. He will be followed by radiation oncology as an outpatient.  Back pain: Seen by PT evaluation.  Continue supportive care, pain medications.  Home health on discharge.  Hypertension: Currently blood pressure stable.  Continue current meds  CKD stage III: Currently kidney function on baseline.  COPD: Currently stable.  Continue bronchodilators as needed.  Hyperlipidemia: Continue Lipitor  Diabetes mellitus: Last hemoglobin A1c was 6.3 as per 11/18.  Anxiety: Continue Xanax     Discharge Diagnoses:  Principal Problem:   Metastatic lung cancer (metastasis from lung to other site) [facility]) Active Problems:   HLD (hyperlipidemia)   TOBACCO ABUSE, HX OF   Diabetes mellitus, type 2 (HCC)   Atherosclerotic PVD with intermittent claudication (HCC)   Chronic obstructive pulmonary disease (HCC)   CKD (chronic kidney disease) stage 3, GFR 30-59 ml/min (HCC)   Essential hypertension   Anxiety state    Discharge Instructions  Discharge Instructions    Diet - low sodium heart healthy   Complete by:  As directed    Discharge instructions   Complete by:  As directed    1)Please take prescribed medications as instructed. 2)Follow up with your PCP as an outpatient. 3)You will be called by Oncology and Radiation Oncology for outpatient appointment. 4)Follow up with Home Health.   Increase activity slowly   Complete by:  As directed      Allergies as of 03/18/2018      Reactions   Penicillins Rash   DID THE REACTION INVOLVE: Swelling of the face/tongue/throat, SOB, or low BP?  Yes Sudden or severe rash/hives, skin peeling, or the inside of the mouth or nose? No Did it require medical treatment? Was already in hosp When did it last happen?40 years ago If all above  answers are "NO", may proceed with cephalosporin use.      Medication List    STOP taking these medications   naproxen sodium 220 MG tablet Commonly known as:  ALEVE     TAKE these medications   ALPRAZolam 0.5 MG tablet Commonly known as:  XANAX TAKE 1 TABLET BY MOUTH AT BEDTIME   atorvastatin 20 MG tablet Commonly known as:  LIPITOR TAKE 1 TABLET BY MOUTH ONCE DAILY AT  6  PM What changed:  See the new instructions.   budesonide-formoterol 160-4.5 MCG/ACT inhaler Commonly known as:  SYMBICORT Inhale 2 puffs into the lungs 2 (two) times daily as needed (for COPD flares).   clopidogrel 75 MG tablet Commonly known as:  PLAVIX Take 1 tablet (75 mg total) by mouth daily.   famotidine 20 MG tablet Commonly known as:  PEPCID TAKE 1 TABLET BY MOUTH TWICE DAILY What changed:  when to take this   HYDROcodone-acetaminophen 5-325 MG tablet Commonly known as:  NORCO/VICODIN Take 1-2 tablets by mouth every 4 (four) hours as needed for moderate pain.   ibuprofen 200 MG tablet Commonly known as:  ADVIL,MOTRIN Take 200 mg by mouth every 6 (six) hours as needed (for pain).   metoprolol succinate 25 MG 24 hr tablet Commonly known as:  TOPROL-XL TAKE ONE TABLET BY MOUTH ONCE DAILY What changed:  when to take this   NITROSTAT 0.4 MG SL tablet Generic drug:  nitroGLYCERIN DISSOLVE ONE TABLET UNDER THE TONGUE AS DIRECTED What changed:  See the new instructions.      Follow-up Information    Susy Frizzle, MD. Schedule an appointment as soon as possible for a visit in 1 week(s).   Specialty:  Family Medicine Contact information: [address] [place] [address] [phone]          Allergies  Allergen Reactions  . Penicillins Rash    DID THE REACTION INVOLVE: Swelling of the face/tongue/throat, SOB, or low BP? Yes Sudden or severe rash/hives, skin peeling, or the inside of the mouth or nose? No Did it require medical treatment? Was already in hosp When  did it last happen?40 years ago If all above answers are "NO", may proceed with cephalosporin use.     Consultations:  Oncology,radiation oncology,IR   Procedures/Studies: Dg Chest 2 View  Result Date: 03/16/2018 CLINICAL DATA:  Back pain, right leg pain. EXAM: CHEST - 2 VIEW COMPARISON:  08/24/2010 FINDINGS: Biapical pleural/parenchymal scarring. Heart is normal size. No confluent airspace opacities or effusions. No acute bony abnormality. IMPRESSION: Biapical scarring.  No active disease. Electronically Signed   By: Rolm Baptise M.D.   On: 03/16/2018 08:56   Ct Chest Wo Contrast  Result Date: 03/16/2018 CLINICAL DATA:  Lytic destructive lesion in the right sacrum on CT angiogram performed earlier today. EXAM: CT CHEST WITHOUT CONTRAST TECHNIQUE: Multidetector CT imaging of the chest was performed following the standard protocol without IV contrast. COMPARISON:  03/16/2018 CT angiogram of the abdomen and pelvis with lower extremity runoff. FINDINGS: Cardiovascular: Normal heart size. No significant pericardial effusion/thickening. Three-vessel coronary atherosclerosis. Atherosclerotic nonaneurysmal thoracic aorta. Normal caliber pulmonary arteries. Mediastinum/Nodes: No discrete thyroid nodules. Unremarkable esophagus. No pathologically enlarged axillary, mediastinal or hilar lymph nodes, noting limited sensitivity for the detection of hilar adenopathy  on this noncontrast study. Lungs/Pleura: No pneumothorax. No pleural effusion. Mild centrilobular and paraseptal emphysema with diffuse bronchial wall thickening. There is an irregular spiculated 4.3 x 1.6 cm apical right upper lobe lung mass (series 4/image 21) with cavitary change inferiorly. A few clustered small solid pulmonary nodules are noted in the apical left upper lobe, largest 4 mm (series 4/image 21). There is scattered mild cylindrical bronchiectasis in both lungs, most prominent in the left lower lobe. Upper abdomen: Cholelithiasis.  Simple 2.0 cm medial upper left renal cyst. Partially visualized abdominal aortic surgical bypass graft. Musculoskeletal: Lytic destructive expansile 5.0 x 3.1 cm lateral left fourth rib mass (series 4/image 45). No additional focal osseous lesions in the chest. Mild thoracic spondylosis. IMPRESSION: 1. Partially cavitary irregular spiculated 4.3 x 1.6 cm apical right upper lobe lung mass. Given the suspected osseous metastases, this is suspicious for a primary bronchogenic carcinoma. Multidisciplinary thoracic oncology consultation suggested. PET-CT is suggested for further characterization and staging evaluation. 2. No thoracic adenopathy. 3. Lytic destructive expansile lateral left fourth rib mass most compatible with metastasis. 4. Clustered small solid pulmonary nodules in the apical left upper lobe, largest 4 mm, for which follow-up chest CT is advised in 3 months. 5. Scattered mild cylindrical bronchiectasis in both lungs, most prominent in the left lower lobe. 6. Three-vessel coronary atherosclerosis. 7. Cholelithiasis. Aortic Atherosclerosis (ICD10-I70.0) and Emphysema (ICD10-J43.9). Electronically Signed   By: Ilona Sorrel M.D.   On: 03/16/2018 09:36   Ct Lumbar Spine Wo Contrast  Result Date: 03/16/2018 CLINICAL DATA:  Back pain EXAM: CT LUMBAR SPINE WITHOUT CONTRAST TECHNIQUE: Multidetector CT imaging of the lumbar spine was performed without intravenous contrast administration. Multiplanar CT image reconstructions were also generated. COMPARISON:  CTA 02/19/2011 FINDINGS: Segmentation: Normal Alignment: Mild dextroscoliosis at L3-4.  Normal sagittal alignment. Vertebrae: Destructive mass lesion in the right sacrum measuring 3.5 cm. This was not present 2012 and is most consistent with metastatic disease. No other destructive bone lesions identified. Paraspinal and other soft tissues: Atherosclerotic disease. Vascular findings reported separately from CTA today. No Peri spinal mass or adenopathy.  Coronary calcification.  Lung bases clear. Disc levels: T12-L1: Negative L1-2: Negative L2-3: Mild disc bulging and mild facet degeneration. Negative for stenosis L3-4: Asymmetric advanced disc degeneration on the left with disc space narrowing and spurring. Bilateral facet degeneration. Negative for stenosis L4-5: Moderate disc and facet degeneration.  Mild spinal stenosis. L5-S1: Asymmetric facet degeneration on the right with mild subarticular stenosis on the right due to spurring. IMPRESSION: Destructive mass lesion right sacrum, probable metastatic disease. No other bone lesions Lumbar scoliosis and multilevel degenerative changes above. These results were called by telephone at the time of interpretation on 03/16/2018 at 7:57 am to Dr. Regenia Skeeter, who verbally acknowledged these results. Electronically Signed   By: Franchot Gallo M.D.   On: 03/16/2018 07:57   Ct Guided Needle Placement  Result Date: 03/18/2018 INDICATION: 85 year old with destructive bone lesions in the right sacrum and left fourth rib. There is also a suspicious lesion in the right lung. Findings are concerning for metastatic disease. Tissue diagnosis is needed. EXAM: CT-GUIDED CORE BIOPSY OF LEFT FOURTH RIB LESION MEDICATIONS: None. ANESTHESIA/SEDATION: Moderate (conscious) sedation was employed during this procedure. A total of Versed 1.0 mg and Fentanyl 50 mcg was administered intravenously. Moderate Sedation Time: 21 minutes. The patient's level of consciousness and vital signs were monitored continuously by radiology nursing throughout the procedure under my direct supervision. FLUOROSCOPY TIME:  None COMPLICATIONS: None immediate. PROCEDURE: Informed  written consent was obtained from the patient after a thorough discussion of the procedural risks, benefits and alternatives. All questions were addressed. A timeout was performed prior to the initiation of the procedure. Patient was placed supine on the CT scanner with the left arm  elevated. Images through the chest were obtained. The destructive lesion involving the lateral left chest wall and left fourth rib was identified. The left axilla was prepped with chlorhexidine and sterile field was created. Skin and soft tissues were anesthetized with 1% lidocaine. 17 gauge coaxial needle directed into the rib lesion using CT guidance. Total of 5 core biopsies were obtained with an 18 gauge core device. Small pieces of core biopsy were obtained and placed in formalin. Needle was removed without complication. Bandage placed over the puncture site. FINDINGS: Destructive soft tissue lesion along the lateral left chest wall involving the left fourth rib. Needle position confirmed within the lesion. Small amount of core biopsy material was obtained. IMPRESSION: CT-guided core biopsies of the left chest wall/fourth rib lesion. Electronically Signed   By: Markus Daft M.D.   On: 03/18/2018 16:10   Mr Jeri Cos RK Contrast  Result Date: 03/18/2018 CLINICAL DATA:  Newly discovered lung mass.  Lung cancer staging. EXAM: MRI HEAD WITHOUT AND WITH CONTRAST TECHNIQUE: Multiplanar, multiecho pulse sequences of the brain and surrounding structures were obtained without and with intravenous contrast. CONTRAST:  6.5 mL Gadavist COMPARISON:  Head CT 07/18/2015 and MRI 07/15/2015 FINDINGS: Brain: There is no evidence of acute infarct, midline shift, or extra-axial fluid collection. A large chronic right cerebellar infarct is noted with minimal chronic blood products. Chronic left basal ganglia, pontine, and right thalamic lacunar infarcts are again noted. Small foci of T2 hyperintensity in the cerebral white matter bilaterally and pons are nonspecific but compatible with mild chronic small vessel ischemic disease at most slightly progressed since the prior MRI. There is minimal non masslike enhancement associated with the chronic right cerebellar infarct. No abnormal brain parenchymal or meningeal enhancement  suggestive of metastatic disease is identified. Cerebral atrophy is mild for age. Vascular: Major intracranial vascular flow voids are preserved. Skull and upper cervical spine: No suspicious marrow lesion. Moderate ligamentous hypertrophy posterior to the dens. Sinuses/Orbits: Bilateral cataract extraction. Clear paranasal sinuses. Trace mastoid effusions. Other: None. IMPRESSION: 1. No evidence of intracranial metastatic disease. 2. Chronic ischemic changes including a large old cerebellar infarct as detailed above. Electronically Signed   By: Logan Bores M.D.   On: 03/18/2018 13:55   Ct Angio Aortobifemoral W And/or Wo Contrast  Result Date: 03/16/2018 CLINICAL DATA:  Back pain.  Right leg pain. EXAM: CT ANGIOGRAPHY OF ABDOMINAL AORTA WITH ILIOFEMORAL RUNOFF TECHNIQUE: Multidetector CT imaging of the abdomen, pelvis and lower extremities was performed using the standard protocol during bolus administration of intravenous contrast. Multiplanar CT image reconstructions and MIPs were obtained to evaluate the vascular anatomy. CONTRAST:  131mL ISOVUE-370 IOPAMIDOL (ISOVUE-370) INJECTION 76% COMPARISON:  02/19/2011 FINDINGS: VASCULAR Aorta: Prior aorto bi femoral bypass. Negative aorta is heavily calcified. No aneurysm or dissection. Celiac: Calcified plaque in the proximal celiac artery without significant stenosis. SMA: Calcified plaque throughout the vessel, patent. Renals: Probable ostial stenoses bilaterally. IMA: Fills via collaterals. RIGHT Lower Extremity Inflow: Iliofemoral bypass is patent.  No aneurysm or dissection. Outflow: Heavily diseased/calcified common femoral artery. Runoff: Heavily calcified and diseased superficial femoral artery proximally. This occludes in the proximal to mid superficial femoral artery. There is a reported fem-pop bypass graft on the right which is  not visualized, presumably occluded. Profunda is disease proximally but patent. Reconstitution of the distal popliteal artery.  Proximal trifurcation vessels appear patent but very small and difficult to visualize below the mid calf. LEFT Lower Extremity Inflow: Aortofemoral bypass is patent. Outflow: Common femoral artery is diseased and calcified. Runoff: Occlusion of the left superficial femoral artery proximally. Profunda is patent. Reconstitution of the popliteal artery above the knee with moderate disease. Proximal trifurcation vessels are visualized and patent. Difficult to visualize below the mid calf. Veins: Grossly patent. Review of the MIP images confirms the above findings. NON-VASCULAR Lower chest: No acute abnormality. Hepatobiliary: Small layering gallstones in the gallbladder. No focal hepatic abnormality. Pancreas: No focal abnormality or ductal dilatation. Spleen: No focal abnormality.  Normal size. Adrenals/Urinary Tract: Areas of decreased enhancement and cortical thinning in the lower poles of both kidneys, likely related to renovascular disease. No hydronephrosis or suspicious renal mass. Adrenal glands and urinary bladder unremarkable. Stomach/Bowel: Normal appendix. Left colonic diverticulosis. No active diverticulitis. No evidence of bowel obstruction. Lymphatic: No adenopathy. Reproductive: Prostate enlargement Other: No free fluid or free air. Musculoskeletal: There is a lytic destructive expansile lesion involving much of the right side of the sacrum measuring up to 4.2 cm on image 108. IMPRESSION: VASCULAR Prior aortobifemoral bypass which is patent. Heavily diseased and occluded superficial femoral arteries bilaterally, in the upper thigh on the left and upper to mid thigh on the right. Reconstitution of the right popliteal artery below the knee which is heavily diseased. Proximal trifurcation vessels are patent but difficult to visualize below the mid calf. No visible patent bypass graft. Reconstitution of the left popliteal artery above the knee with moderate disease. Proximal trifurcation vessels are patent  but difficult to visualize below the mid calf. NON-VASCULAR Lytic destructive lesion within the right sacrum concerning for metastasis or myeloma. Enlarged prostate. Left colonic diverticulosis.  No active diverticulitis. Layering gallstones within the gallbladder. These results were called by telephone at the time of interpretation on 03/16/2018 at 8:28 am to Dr. Regenia Skeeter , who verbally acknowledged these results. Electronically Signed   By: Rolm Baptise M.D.   On: 03/16/2018 08:29   Dg Chest Port 1 View  Result Date: 03/18/2018 CLINICAL DATA:  Rib lesion EXAM: PORTABLE CHEST 1 VIEW COMPARISON:  03/16/2018 FINDINGS: The destructive mass involving the left fourth rib is stable. Lungs are otherwise clear. Normal heart size. No pneumothorax. IMPRESSION: No pneumothorax post left rib lesion biopsy. Electronically Signed   By: Marybelle Killings M.D.   On: 03/18/2018 17:12      Subjective: Patient seen and examined at bedside this morning.  He looked comfortable.  Hemodynamically stable. still complains of back pain but looks like pain is well controlled.  No complaint of chest pain or shortness of breath. Discuss about the further management plan and discharge tomorrow.   Patient verbalized the understanding   Discharge Exam: Vitals:   03/18/18 1630 03/18/18 1658  BP: (!) 167/82 (!) 162/84  Pulse: 74 78  Resp: 16 18  Temp: 98.1 F (36.7 C)   SpO2: 99% 98%   Vitals:   03/18/18 1600 03/18/18 1615 03/18/18 1630 03/18/18 1658  BP: (!) 134/55 (!) 152/71 (!) 167/82 (!) 162/84  Pulse: 73 67 74 78  Resp: 16 16 16 18   Temp: 97.8 F (36.6 C) 98.1 F (36.7 C) 98.1 F (36.7 C)   TempSrc: Oral Oral Oral   SpO2: 98% 98% 99% 98%  Weight:      Height:  General: Pt is alert, awake, not in acute distress Cardiovascular: RRR, S1/S2 +, no rubs, no gallops Respiratory: CTA bilaterally, no wheezing, no rhonchi Abdominal: Soft, NT, ND, bowel sounds + Extremities: no edema, no cyanosis    The  results of significant diagnostics from this hospitalization (including imaging, microbiology, ancillary and laboratory) are listed below for reference.     Microbiology: No results found for this or any previous visit (from the past 240 hour(s)).   Labs: BNP (last 3 results) No results for input(s): BNP in the last 8760 hours. Basic Metabolic Panel: Recent Labs  Lab 03/16/18 0532 03/17/18 0215 03/18/18 0238  NA 139 137 135  K 4.0 4.4 5.0  CL 101 101 98  CO2 26 29 29   GLUCOSE 131* 105* 102*  BUN 26* 29* 28*  CREATININE 1.49* 1.57* 1.46*  CALCIUM 9.4 9.3 9.2   Liver Function Tests: No results for input(s): AST, ALT, ALKPHOS, BILITOT, PROT, ALBUMIN in the last 168 hours. No results for input(s): LIPASE, AMYLASE in the last 168 hours. No results for input(s): AMMONIA in the last 168 hours. CBC: Recent Labs  Lab 03/16/18 0532 03/17/18 0215  WBC 9.5 9.1  NEUTROABS 6.5  --   HGB 14.1 13.6  HCT 43.5 42.0  MCV 99.1 98.8  PLT 191 215   Cardiac Enzymes: No results for input(s): CKTOTAL, CKMB, CKMBINDEX, TROPONINI in the last 168 hours. BNP: Invalid input(s): POCBNP CBG: No results for input(s): GLUCAP in the last 168 hours. D-Dimer No results for input(s): DDIMER in the last 72 hours. Hgb A1c No results for input(s): HGBA1C in the last 72 hours. Lipid Profile No results for input(s): CHOL, HDL, LDLCALC, TRIG, CHOLHDL, LDLDIRECT in the last 72 hours. Thyroid function studies No results for input(s): TSH, T4TOTAL, T3FREE, THYROIDAB in the last 72 hours.  Invalid input(s): FREET3 Anemia work up No results for input(s): VITAMINB12, FOLATE, FERRITIN, TIBC, IRON, RETICCTPCT in the last 72 hours. Urinalysis    Component Value Date/Time   COLORURINE YELLOW 03/15/2018 2147   APPEARANCEUR CLEAR 03/15/2018 2147   LABSPEC 1.013 03/15/2018 2147   PHURINE 6.0 03/15/2018 2147   GLUCOSEU 50 (A) 03/15/2018 2147   HGBUR NEGATIVE 03/15/2018 2147   BILIRUBINUR NEGATIVE 03/15/2018  2147   [place] NEGATIVE 03/15/2018 2147   PROTEINUR 100 (A) 03/15/2018 2147   UROBILINOGEN 0.2 07/16/2010 1429   NITRITE NEGATIVE 03/15/2018 2147   LEUKOCYTESUR NEGATIVE 03/15/2018 2147   Sepsis Labs Invalid input(s): PROCALCITONIN,  WBC,  LACTICIDVEN Microbiology No results found for this or any previous visit (from the past 240 hour(s)).  Please note: You were cared for by a hospitalist during your hospital stay. Once you are discharged, your primary care physician will handle any further medical issues. Please note that NO REFILLS for any discharge medications will be authorized once you are discharged, as it is imperative that you return to your primary care physician (or establish a relationship with a primary care physician if you do not have one) for your post hospital discharge needs so that they can reassess your need for medications and monitor your lab values.    Time coordinating discharge: 40 minutes  SIGNED:   Shelly Coss, MD  [facility] 03/18/2018, 5:16 PM Pager [phone]  If 7PM-7AM, please contact night-coverage [email or web address] Password [email or web address]

## 2018-03-18 NOTE — Progress Notes (Signed)
PROGRESS NOTE    Martin [name]  ZTI:[id number] DOB: [birth year] DOA: 03/15/2018 PCP: Susy Frizzle, MD   Brief Narrative: Patient is 85 year old male with past medical history of CVA, peripheral vascular disease with renal artery stenosis,  Coronary artery disease with stents, hyperlipidemia, GERD, diabetes, COPD who presents with  complaint of back pain.  He was bothered with back pain  for last 6 weeks.  Imagings done here on presentation showed cavitary spiculated 4.3x 1.6 cm left apical right upper lobe mass.  Also showed lytic destruction in the right sacrum suggestive of metastatic disease. He was also found to have left fourth rib mass highly suspicious for metastatic disease.  Pulmonology, oncology and IR consulted.IR planning for chest wall mass biopsy tomorrow.  Radiation oncology also consulted for radiation therapy for the sacral metastatic lesion.  Assessment & Plan:   Principal Problem:   Metastatic lung cancer (metastasis from lung to other site) [facility]) Active Problems:   HLD (hyperlipidemia)   TOBACCO ABUSE, HX OF   Diabetes mellitus, type 2 (HCC)   Atherosclerotic PVD with intermittent claudication (HCC)   Chronic obstructive pulmonary disease (HCC)   CKD (chronic kidney disease) stage 3, GFR 30-59 ml/min (HCC)   Essential hypertension   Anxiety state  Metastatic lung cancer: Presented with right-sided sacrum pain, progressive inability to walk for the last 6 weeks.  CT of the lumbar spine showed metastatic destructive lesion on the area of pain.  CT chest showed partially cavitary irregular spiculated 4.3x 1.6 cm left apical right upper lobe mass suspicious for primary bronchogenic carcinoma.  Also found to have lateral left fourth rib mass which is apparently asymptomatic. Oncology consulted.  Will be followed by oncology as an outpatient for PET scan and further management. Pulmonary evaluated the patient and concluded that bronchoscopic approach will be difficult for  the lung mass. IR  consulted for biopsy of the chest wall mass.  Plan for biopsy tomorrow. I have also consulted and discussed with radiation oncology.  He will be followed by radiation oncology as an outpatient.  Back pain: Seen by PT evaluation.  Continue supportive care, pain medications.  Home health on discharge.  Hypertension: Currently blood pressure stable.  Continue current meds  CKD stage III: Currently kidney function on baseline.  COPD: Currently stable.  Continue bronchodilators as needed.  Hyperlipidemia: Continue Lipitor  Diabetes mellitus: Last hemoglobin A1c was 6.3 as per 11/18.  Anxiety: Continue Xanax         DVT prophylaxis: Lovenox Code Status: Full Family Communication: None present at the bedside Disposition Plan: Home after biopsy of the chest wall mass   Consultants: Oncology  Procedures: None  Antimicrobials: None  Subjective: Patient seen and examined at bedside this morning.  He looked comfortable.  Hemodynamically stable. still complains of back pain but looks like pain is well controlled.  No complaint of chest pain or shortness of breath. Discuss about the further management plan.  Patient verbalized the understanding.  Objective: Vitals:   03/17/18 1037 03/17/18 1642 03/17/18 2245 03/18/18 0711  BP: (!) 141/72 135/71 130/76 124/67  Pulse: 65 65 67 62  Resp: 16 15 16 14   Temp: (!) 97.5 F (36.4 C) 97.6 F (36.4 C) 97.6 F (36.4 C) (!) 97.5 F (36.4 C)  TempSrc: Oral Oral Oral Oral  SpO2:  99% 100% 97%  Weight: 65.3 kg     Height: 5\' 6"  (1.676 m)       Intake/Output Summary (Last 24 hours) at 03/18/2018  [place] filed at 03/18/2018 0900 Gross per 24 hour  Intake 240 ml  Output -  Net 240 ml   Filed Weights   03/17/18 0300 03/17/18 1037  Weight: 65.3 kg 65.3 kg    Examination:  General exam: Very pleasant elderly male,not  in distress HEENT:PERRL,Oral mucosa moist, Ear/Nose normal on gross exam, no  lymphadenopathy Respiratory system: Bilateral decreased air entry on the bases, normal vesicular breath sounds, no wheezes or crackles  Cardiovascular system: S1 & S2 heard, RRR. No JVD, murmurs, rubs, gallops or clicks. No pedal edema. Gastrointestinal system: Abdomen is nondistended, soft and nontender. No organomegaly or masses felt. Normal bowel sounds heard. Central nervous system: Alert and oriented. No focal neurological deficits. Extremities: No edema, no clubbing ,no cyanosis, distal peripheral pulses palpable. Skin: No rashes, lesions or ulcers,no icterus ,no pallor MSK: Normal muscle bulk,tone ,power Tenderness  on the lower back. Psychiatry: Judgement and insight appear normal. Mood & affect appropriate.     Data Reviewed: I have personally reviewed following labs and imaging studies  CBC: Recent Labs  Lab 03/16/18 0532 03/17/18 0215  WBC 9.5 9.1  NEUTROABS 6.5  --   HGB 14.1 13.6  HCT 43.5 42.0  MCV 99.1 98.8  PLT 191 725   Basic Metabolic Panel: Recent Labs  Lab 03/16/18 0532 03/17/18 0215 03/18/18 0238  NA 139 137 135  K 4.0 4.4 5.0  CL 101 101 98  CO2 26 29 29   GLUCOSE 131* 105* 102*  BUN 26* 29* 28*  CREATININE 1.49* 1.57* 1.46*  CALCIUM 9.4 9.3 9.2   GFR: Estimated Creatinine Clearance: 33.4 mL/min (A) (by C-G formula based on SCr of 1.46 mg/dL (H)). Liver Function Tests: No results for input(s): AST, ALT, ALKPHOS, BILITOT, PROT, ALBUMIN in the last 168 hours. No results for input(s): LIPASE, AMYLASE in the last 168 hours. No results for input(s): AMMONIA in the last 168 hours. Coagulation Profile: Recent Labs  Lab 03/18/18 1043  INR 1.07   Cardiac Enzymes: No results for input(s): CKTOTAL, CKMB, CKMBINDEX, TROPONINI in the last 168 hours. BNP (last 3 results) No results for input(s): PROBNP in the last 8760 hours. HbA1C: No results for input(s): HGBA1C in the last 72 hours. CBG: No results for input(s): GLUCAP in the last 168  hours. Lipid Profile: No results for input(s): CHOL, HDL, LDLCALC, TRIG, CHOLHDL, LDLDIRECT in the last 72 hours. Thyroid Function Tests: No results for input(s): TSH, T4TOTAL, FREET4, T3FREE, THYROIDAB in the last 72 hours. Anemia Panel: No results for input(s): VITAMINB12, FOLATE, FERRITIN, TIBC, IRON, RETICCTPCT in the last 72 hours. Sepsis Labs: No results for input(s): PROCALCITON, LATICACIDVEN in the last 168 hours.  No results found for this or any previous visit (from the past 240 hour(s)).       Radiology Studies: No results found.      Scheduled Meds: . ALPRAZolam  0.5 mg Oral QHS  . atorvastatin  20 mg Oral QHS  . docusate sodium  100 mg Oral BID  . enoxaparin (LOVENOX) injection  40 mg Subcutaneous Q24H  . famotidine  20 mg Oral QHS  . lidocaine  1 patch Transdermal Q24H  . metoprolol succinate  25 mg Oral q1800   Continuous Infusions:   LOS: 2 days    Time spent:35 mins. More than 50% of that time was spent in counseling and/or coordination of care.      Shelly Coss, MD [facility] Pager [phone]  If 7PM-7AM, please contact night-coverage [email or web address] Password  [email or web address] 03/18/2018, 1:41 PM

## 2018-03-18 NOTE — Progress Notes (Signed)
Doug from [place] confirming that pt is being transported from [facility] to [facility] on 03/19/2018 at 0830 for cancer treatment. RN communicated this with pt and pt is aware.  Riley Kill RN

## 2018-03-18 NOTE — Consult Note (Signed)
[facility] CM Primary Care Navigator  03/18/2018  Martin [name] 1930/11/12 [id number]   Met with patient and sister Parke Simmers) at the bedside to identify possible discharge needs. Patient lives alone and sister is not physically able to assist him when patient gets back home since she is recuperating from being sick herself.  Inpatient CM came in the room during this visit and was notified of discharge issues and concerns.  Patient endorses Dr. Jenna Luo with [facility].  Anticipated discharge plan is home with home health services per therapy recommendation.  Inpatient CM spoke with patient and sister regarding [facility] and referral to [facility] from [place] was made.  [facility] hospital liaison notified of patient's discharge disposition.  There are no identifiable [place] Care Management needs at this point.    For additional questions please contact:  Edwena Felty A. Kellin Bartling, BSN, RN-BC [facility] PRIMARY CARE Navigator Cell: [phone]

## 2018-03-18 NOTE — Progress Notes (Signed)
Phoned Doug at [facility] to arrange 0830 pick up for 0900 simulation for this patient. Assured Doug [facility] [place] would be calling report per [employer], PA-C.

## 2018-03-18 NOTE — Procedures (Signed)
Interventional Radiology Procedure:   Indications: Bone lesions, metastatic disease   Procedure: CT guided biopsy of left 4th rib lesion  Findings: Core specimens obtained from left 4th lesion  Complications: None     EBL: Minimal  Plan: Bedrest 2 hours, CXR in one hour.   Keyundra Fant R. Anselm Pancoast, MD  Pager: [phone]

## 2018-03-19 ENCOUNTER — Ambulatory Visit
Admit: 2018-03-19 | Discharge: 2018-03-19 | Disposition: A | Discharge status: Home / Self Care | Payer: Medicare Other | Attending: Radiation Oncology | Admitting: Radiation Oncology

## 2018-03-19 ENCOUNTER — Institutional Professional Consult (permissible substitution): Payer: Medicare Other | Admitting: Radiation Oncology

## 2018-03-19 DIAGNOSIS — C7951 Secondary malignant neoplasm of bone: Secondary | ICD-10-CM

## 2018-03-19 MED ORDER — HYDROCODONE-ACETAMINOPHEN 5-325 MG PO TABS: 1.0000 | ORAL_TABLET | Freq: Four times a day (QID) | ORAL | 0 refills | Status: DC | PRN

## 2018-03-19 MED ORDER — POLYETHYLENE GLYCOL 3350 17 G PO PACK: 17.0000 g | PACK | Freq: Every day | ORAL | 0 refills | Status: DC

## 2018-03-19 MED ORDER — POLYETHYLENE GLYCOL 3350 17 G PO PACK: 17.0000 g | PACK | Freq: Every day | ORAL | Status: DC

## 2018-03-19 MED ORDER — SENNA 8.6 MG PO TABS: 1.0000 | ORAL_TABLET | Freq: Every day | ORAL | 0 refills | Status: DC

## 2018-03-19 NOTE — Progress Notes (Addendum)
  Radiation Oncology         [phone] ________________________________  Name: Martin [name] MRN: [id number]  Date: 03/19/2018  DOB: 12/23/1930  INPATIENT  SIMULATION AND TREATMENT PLANNING NOTE    ICD-10-CM   1. Bone metastasis ([place]) C79.51     DIAGNOSIS:  85 y.o. patient with sacral metastasis  NARRATIVE:  The patient was brought to the [facility].  Identity was confirmed.  All relevant records and images related to the planned course of therapy were reviewed.  The patient freely provided informed written consent to proceed with treatment after reviewing the details related to the planned course of therapy. The consent form was witnessed and verified by the simulation staff.  Then, the patient was set-up in a stable reproducible  supine position for radiation therapy.  CT images were obtained.  Surface markings were placed.  The CT images were loaded into the planning software.  Then the target and avoidance structures were contoured including kidneys.  Treatment planning then occurred.  The radiation prescription was entered and confirmed.  Then, I designed and supervised the construction of a total of 3 medically necessary complex treatment devices with VacLoc positioner and 2 MLCs to shield kidneys.  I have requested : 3D Simulation  I have requested a DVH of the following structures: Left Kidney, Right Kidney and target.  PLAN:  The patient will receive 30 Gy in 10 fractions.  ________________________________  Sheral Apley Tammi Klippel, M.D.

## 2018-03-19 NOTE — Progress Notes (Signed)
Patient seen and examined. Patient with constipation. Likely secondary to narcotics. Will add a stool regimen on discharge. Norco prescription sent electronically to pharmacy. Stable for discharge.  Cordelia Poche, MD [facility] 03/19/2018, 3:08 PM

## 2018-03-19 NOTE — Discharge Instructions (Signed)
Martin [name],  You were found to have metastatic cancer. You are being set up for radiation outpatient. Please also follow-up with your primary care physician.

## 2018-03-19 NOTE — Care Management Note (Signed)
Case Management Note  Patient Details  Name: Martin [name] MRN: [id number] Date of Birth: [birth year]  Subjective/Objective:  From home alone, patient will benefit from the [place] with [place], [place] made referral to [facility] with Alvis Lemmings.  Patient will be going to [facility] in am for bx and will return back to [facility] per RN.  Tommi Rumps will call patient and speak to him over the phone.          1/10 Tomi Bamberger RN, BSN- Tommi Rumps spoke with patient about the [facility], [facility], [facility] and he would like to have this program started.  NCM notified MD that will need HHRN, and Social worker added to [facility] orders prior to dc today.                 Action/Plan: DC home when ready.  Expected Discharge Date:  03/19/18               Expected Discharge Plan:  [place]  In-House Referral:     Discharge planning Services  CM Consult  Post Acute Care Choice:  Home Health Choice offered to:  Patient  DME Arranged:    DME Agency:     HH Arranged:  RN, PT, Social Work [place] Agency:  [place]  Status of Service:  Completed, signed off  If discussed at [employer] of Stay Meetings, dates discussed:    Additional Comments:  Zenon Mayo, RN 03/19/2018, 9:37 AM

## 2018-03-19 NOTE — Progress Notes (Signed)
Physical Therapy Treatment Patient Details Name: Martin [name] MRN: [id number] DOB: 1930/12/14 Today's Date: 03/19/2018    History of Present Illness Patient is 85 year old male with past medical history of CVA, peripheral vascular disease with renal artery stenosis,  Coronary artery disease with stents, hyperlipidemia, GERD, diabetes, COPD who presents with a complaint of back pain. CT demonstrated METS, oncology consult pending.     PT Comments    Pt progressing well with mobility. He demonstrates modified independence with bed mobility and transfers. Supervision required for ambulation 180 feet. Pt demonstrated LOB x 1 but able to self correct. Pt returned to bed at end of session having just returned from procedure at [facility].    Follow Up Recommendations        Equipment Recommendations  None recommended by PT    Recommendations for Other Services       Precautions / Restrictions Precautions Precautions: Fall    Mobility  Bed Mobility Overal bed mobility: Modified Independent                Transfers Overall transfer level: Modified independent Equipment used: None                Ambulation/Gait Ambulation/Gait assistance: Supervision Gait Distance (Feet): 180 Feet Assistive device: None Gait Pattern/deviations: Step-through pattern;Decreased stride length Gait velocity: decreased Gait velocity interpretation: 1.31 - 2.62 ft/sec, indicative of limited community ambulator General Gait Details: LOB x 1 but pt able to self correct. SpO2 WNL on RA.   Stairs             Wheelchair Mobility    Modified Rankin (Stroke Patients Only)       Balance Overall balance assessment: Needs assistance Sitting-balance support: No upper extremity supported;Feet supported Sitting balance-Leahy Scale: Good     Standing balance support: No upper extremity supported;During functional activity Standing balance-Leahy Scale: Fair                               Cognition Arousal/Alertness: Awake/alert Behavior During Therapy: WFL for tasks assessed/performed Overall Cognitive Status: Within Functional Limits for tasks assessed                                        Exercises      General Comments        Pertinent Vitals/Pain Pain Assessment: Faces Faces Pain Scale: Hurts a little bit Pain Location: R leg with ambulation Pain Descriptors / Indicators: Cramping;Sore Pain Intervention(s): Limited activity within patient's tolerance;Monitored during session    Home Living                      Prior Function            PT Goals (current goals can now be found in the care plan section) Acute Rehab PT Goals Patient Stated Goal: home today PT Goal Formulation: With patient/family Time For Goal Achievement: 03/31/18 Potential to Achieve Goals: Good Progress towards PT goals: Progressing toward goals    Frequency    Min 3X/week      PT Plan Current plan remains appropriate    Co-evaluation              AM-PAC PT "6 Clicks" Mobility   Outcome Measure  Help needed turning from your back to your side while in a flat  bed without using bedrails?: None Help needed moving from lying on your back to sitting on the side of a flat bed without using bedrails?: None Help needed moving to and from a bed to a chair (including a wheelchair)?: None Help needed standing up from a chair using your arms (e.g., wheelchair or bedside chair)?: A Little Help needed to walk in hospital room?: A Little Help needed climbing 3-5 steps with a railing? : A Little 6 Click Score: 21    End of Session Equipment Utilized During Treatment: Gait belt Activity Tolerance: Patient tolerated treatment well Patient left: in bed;with call bell/phone within reach Nurse Communication: Mobility status PT Visit Diagnosis: Unsteadiness on feet (R26.81);Pain Pain - Right/Left: Right Pain - part of body: Leg     Time:  8841-6606 PT Time Calculation (min) (ACUTE ONLY): 10 min  Charges:  $Gait Training: 8-22 mins                     Lorrin Goodell, PT  Office # [phone] Pager [phone]    Lorriane Shire 03/19/2018, 10:47 AM

## 2018-03-20 DIAGNOSIS — I70219 Atherosclerosis of native arteries of extremities with intermittent claudication, unspecified extremity: Secondary | ICD-10-CM | POA: Diagnosis not present

## 2018-03-20 DIAGNOSIS — I252 Old myocardial infarction: Secondary | ICD-10-CM | POA: Diagnosis not present

## 2018-03-20 DIAGNOSIS — Z79891 Long term (current) use of opiate analgesic: Secondary | ICD-10-CM | POA: Diagnosis not present

## 2018-03-20 DIAGNOSIS — Z9181 History of falling: Secondary | ICD-10-CM | POA: Diagnosis not present

## 2018-03-20 DIAGNOSIS — Z7901 Long term (current) use of anticoagulants: Secondary | ICD-10-CM | POA: Diagnosis not present

## 2018-03-20 DIAGNOSIS — F411 Generalized anxiety disorder: Secondary | ICD-10-CM | POA: Diagnosis not present

## 2018-03-20 DIAGNOSIS — K219 Gastro-esophageal reflux disease without esophagitis: Secondary | ICD-10-CM | POA: Diagnosis not present

## 2018-03-20 DIAGNOSIS — E1122 Type 2 diabetes mellitus with diabetic chronic kidney disease: Secondary | ICD-10-CM | POA: Diagnosis not present

## 2018-03-20 DIAGNOSIS — C7951 Secondary malignant neoplasm of bone: Secondary | ICD-10-CM | POA: Diagnosis not present

## 2018-03-20 DIAGNOSIS — E1151 Type 2 diabetes mellitus with diabetic peripheral angiopathy without gangrene: Secondary | ICD-10-CM | POA: Diagnosis not present

## 2018-03-20 DIAGNOSIS — Z87891 Personal history of nicotine dependence: Secondary | ICD-10-CM | POA: Diagnosis not present

## 2018-03-20 DIAGNOSIS — K5901 Slow transit constipation: Secondary | ICD-10-CM | POA: Diagnosis not present

## 2018-03-20 DIAGNOSIS — N183 Chronic kidney disease, stage 3 (moderate): Secondary | ICD-10-CM | POA: Diagnosis not present

## 2018-03-20 DIAGNOSIS — G4700 Insomnia, unspecified: Secondary | ICD-10-CM | POA: Diagnosis not present

## 2018-03-20 DIAGNOSIS — I129 Hypertensive chronic kidney disease with stage 1 through stage 4 chronic kidney disease, or unspecified chronic kidney disease: Secondary | ICD-10-CM | POA: Diagnosis not present

## 2018-03-20 DIAGNOSIS — I4891 Unspecified atrial fibrillation: Secondary | ICD-10-CM | POA: Diagnosis not present

## 2018-03-20 DIAGNOSIS — Z95818 Presence of other cardiac implants and grafts: Secondary | ICD-10-CM | POA: Diagnosis not present

## 2018-03-20 DIAGNOSIS — J449 Chronic obstructive pulmonary disease, unspecified: Secondary | ICD-10-CM | POA: Diagnosis not present

## 2018-03-20 DIAGNOSIS — E785 Hyperlipidemia, unspecified: Secondary | ICD-10-CM | POA: Diagnosis not present

## 2018-03-20 DIAGNOSIS — I2510 Atherosclerotic heart disease of native coronary artery without angina pectoris: Secondary | ICD-10-CM | POA: Diagnosis not present

## 2018-03-20 DIAGNOSIS — Z8673 Personal history of transient ischemic attack (TIA), and cerebral infarction without residual deficits: Secondary | ICD-10-CM | POA: Diagnosis not present

## 2018-03-20 DIAGNOSIS — C3400 Malignant neoplasm of unspecified main bronchus: Secondary | ICD-10-CM | POA: Diagnosis not present

## 2018-03-21 DIAGNOSIS — N183 Chronic kidney disease, stage 3 (moderate): Secondary | ICD-10-CM | POA: Diagnosis not present

## 2018-03-21 DIAGNOSIS — C7951 Secondary malignant neoplasm of bone: Secondary | ICD-10-CM | POA: Diagnosis not present

## 2018-03-21 DIAGNOSIS — I129 Hypertensive chronic kidney disease with stage 1 through stage 4 chronic kidney disease, or unspecified chronic kidney disease: Secondary | ICD-10-CM | POA: Diagnosis not present

## 2018-03-21 DIAGNOSIS — E1122 Type 2 diabetes mellitus with diabetic chronic kidney disease: Secondary | ICD-10-CM | POA: Diagnosis not present

## 2018-03-21 DIAGNOSIS — I2510 Atherosclerotic heart disease of native coronary artery without angina pectoris: Secondary | ICD-10-CM | POA: Diagnosis not present

## 2018-03-21 DIAGNOSIS — C3400 Malignant neoplasm of unspecified main bronchus: Secondary | ICD-10-CM | POA: Diagnosis not present

## 2018-03-21 NOTE — Addendum Note (Signed)
Encounter addended by: Tyler Pita, MD on: 03/21/2018 4:19 PM  Actions taken: Clinical Note Signed

## 2018-03-22 ENCOUNTER — Telehealth: Payer: Self-pay | Admitting: *Deleted

## 2018-03-22 ENCOUNTER — Encounter: Payer: Self-pay | Admitting: *Deleted

## 2018-03-22 ENCOUNTER — Ambulatory Visit
Admission: RE | Admit: 2018-03-22 | Discharge: 2018-03-22 | Disposition: A | Discharge status: Home / Self Care | Payer: Medicare Other | Source: Ambulatory Visit | Attending: Radiation Oncology | Admitting: Radiation Oncology

## 2018-03-22 DIAGNOSIS — Z510 Encounter for antineoplastic radiation therapy: Secondary | ICD-10-CM | POA: Diagnosis not present

## 2018-03-22 DIAGNOSIS — I2510 Atherosclerotic heart disease of native coronary artery without angina pectoris: Secondary | ICD-10-CM | POA: Diagnosis not present

## 2018-03-22 DIAGNOSIS — C3490 Malignant neoplasm of unspecified part of unspecified bronchus or lung: Secondary | ICD-10-CM

## 2018-03-22 DIAGNOSIS — I129 Hypertensive chronic kidney disease with stage 1 through stage 4 chronic kidney disease, or unspecified chronic kidney disease: Secondary | ICD-10-CM | POA: Diagnosis not present

## 2018-03-22 DIAGNOSIS — C3400 Malignant neoplasm of unspecified main bronchus: Secondary | ICD-10-CM | POA: Diagnosis not present

## 2018-03-22 DIAGNOSIS — C7951 Secondary malignant neoplasm of bone: Secondary | ICD-10-CM | POA: Diagnosis not present

## 2018-03-22 DIAGNOSIS — E1122 Type 2 diabetes mellitus with diabetic chronic kidney disease: Secondary | ICD-10-CM | POA: Diagnosis not present

## 2018-03-22 DIAGNOSIS — N183 Chronic kidney disease, stage 3 (moderate): Secondary | ICD-10-CM | POA: Diagnosis not present

## 2018-03-22 NOTE — Telephone Encounter (Signed)
Oncology Nurse Navigator Documentation  Oncology Nurse Navigator Flowsheets 03/22/2018  Navigator Location [facility]  Referral date to RadOnc/MedOnc 03/22/2018  Navigator Encounter Type Telephone/I called Mr. Heinze to introduce myself and update him on Dr. Julien Nordmann ordered PET scan.  Patient will be seen with Dr. Julien Nordmann after PET scan.  Patient is aware of above information.  Questions addressed and clarified.   Telephone Outgoing Call  Abnormal Finding Date 03/16/2018  Confirmed Diagnosis Date 03/18/2018  Treatment Initiated Date 03/19/2018  Treatment Phase Treatment  Barriers/Navigation Needs Education;Coordination of Care  Education Other  Interventions Coordination of Care;Education  Coordination of Care Other  Education Method Verbal  Acuity Level 2  Time Spent with Patient 30

## 2018-03-23 ENCOUNTER — Ambulatory Visit
Admission: RE | Admit: 2018-03-23 | Discharge: 2018-03-23 | Disposition: A | Discharge status: Home / Self Care | Payer: Medicare Other | Source: Ambulatory Visit | Attending: Radiation Oncology | Admitting: Radiation Oncology

## 2018-03-23 ENCOUNTER — Encounter: Payer: Self-pay | Admitting: Internal Medicine

## 2018-03-23 ENCOUNTER — Telehealth: Payer: Self-pay | Admitting: Internal Medicine

## 2018-03-23 DIAGNOSIS — C3400 Malignant neoplasm of unspecified main bronchus: Secondary | ICD-10-CM | POA: Diagnosis not present

## 2018-03-23 DIAGNOSIS — N183 Chronic kidney disease, stage 3 (moderate): Secondary | ICD-10-CM | POA: Diagnosis not present

## 2018-03-23 DIAGNOSIS — C7951 Secondary malignant neoplasm of bone: Secondary | ICD-10-CM | POA: Diagnosis not present

## 2018-03-23 DIAGNOSIS — I2510 Atherosclerotic heart disease of native coronary artery without angina pectoris: Secondary | ICD-10-CM | POA: Diagnosis not present

## 2018-03-23 DIAGNOSIS — E1122 Type 2 diabetes mellitus with diabetic chronic kidney disease: Secondary | ICD-10-CM | POA: Diagnosis not present

## 2018-03-23 DIAGNOSIS — I129 Hypertensive chronic kidney disease with stage 1 through stage 4 chronic kidney disease, or unspecified chronic kidney disease: Secondary | ICD-10-CM | POA: Diagnosis not present

## 2018-03-23 DIAGNOSIS — Z510 Encounter for antineoplastic radiation therapy: Secondary | ICD-10-CM | POA: Diagnosis not present

## 2018-03-23 NOTE — Telephone Encounter (Signed)
A hospital follow up appt has been scheduled for the pt to see Dr. Julien Nordmann on 1/28 at 330pm. I cld and left the appt date and time on the pt's vm. Letter mailed.

## 2018-03-24 ENCOUNTER — Ambulatory Visit
Admission: RE | Admit: 2018-03-24 | Discharge: 2018-03-24 | Disposition: A | Discharge status: Home / Self Care | Payer: Medicare Other | Source: Ambulatory Visit | Attending: Radiation Oncology | Admitting: Radiation Oncology

## 2018-03-24 ENCOUNTER — Telehealth: Payer: Self-pay | Admitting: Internal Medicine

## 2018-03-24 DIAGNOSIS — C7951 Secondary malignant neoplasm of bone: Secondary | ICD-10-CM | POA: Diagnosis not present

## 2018-03-24 DIAGNOSIS — C3400 Malignant neoplasm of unspecified main bronchus: Secondary | ICD-10-CM | POA: Diagnosis not present

## 2018-03-24 DIAGNOSIS — N183 Chronic kidney disease, stage 3 (moderate): Secondary | ICD-10-CM | POA: Diagnosis not present

## 2018-03-24 DIAGNOSIS — I129 Hypertensive chronic kidney disease with stage 1 through stage 4 chronic kidney disease, or unspecified chronic kidney disease: Secondary | ICD-10-CM | POA: Diagnosis not present

## 2018-03-24 DIAGNOSIS — Z510 Encounter for antineoplastic radiation therapy: Secondary | ICD-10-CM | POA: Diagnosis not present

## 2018-03-24 DIAGNOSIS — I2510 Atherosclerotic heart disease of native coronary artery without angina pectoris: Secondary | ICD-10-CM | POA: Diagnosis not present

## 2018-03-24 DIAGNOSIS — E1122 Type 2 diabetes mellitus with diabetic chronic kidney disease: Secondary | ICD-10-CM | POA: Diagnosis not present

## 2018-03-24 NOTE — Telephone Encounter (Signed)
Pt cld and confirmed appt w/Dr. Julien Nordmann on 1/28

## 2018-03-25 ENCOUNTER — Ambulatory Visit
Admission: RE | Admit: 2018-03-25 | Discharge: 2018-03-25 | Disposition: A | Discharge status: Home / Self Care | Payer: Medicare Other | Source: Ambulatory Visit | Attending: Radiation Oncology | Admitting: Radiation Oncology

## 2018-03-25 ENCOUNTER — Inpatient Hospital Stay: Payer: Medicare Other | Admitting: Family Medicine

## 2018-03-25 DIAGNOSIS — C7951 Secondary malignant neoplasm of bone: Secondary | ICD-10-CM | POA: Diagnosis not present

## 2018-03-25 DIAGNOSIS — Z510 Encounter for antineoplastic radiation therapy: Secondary | ICD-10-CM | POA: Diagnosis not present

## 2018-03-26 ENCOUNTER — Other Ambulatory Visit: Payer: Self-pay | Admitting: Family Medicine

## 2018-03-26 ENCOUNTER — Ambulatory Visit
Admission: RE | Admit: 2018-03-26 | Discharge: 2018-03-26 | Disposition: A | Discharge status: Home / Self Care | Payer: Medicare Other | Source: Ambulatory Visit | Attending: Radiation Oncology | Admitting: Radiation Oncology

## 2018-03-26 DIAGNOSIS — C7951 Secondary malignant neoplasm of bone: Secondary | ICD-10-CM | POA: Diagnosis not present

## 2018-03-26 DIAGNOSIS — Z510 Encounter for antineoplastic radiation therapy: Secondary | ICD-10-CM | POA: Diagnosis not present

## 2018-03-28 NOTE — Addendum Note (Signed)
Encounter addended by: Tyler Pita, MD on: 03/28/2018 2:29 PM  Actions taken: Medication List reviewed, Problem List reviewed, Allergies reviewed

## 2018-03-29 ENCOUNTER — Ambulatory Visit
Admission: RE | Admit: 2018-03-29 | Discharge: 2018-03-29 | Disposition: A | Discharge status: Home / Self Care | Payer: Medicare Other | Source: Ambulatory Visit | Attending: Radiation Oncology | Admitting: Radiation Oncology

## 2018-03-29 DIAGNOSIS — C3400 Malignant neoplasm of unspecified main bronchus: Secondary | ICD-10-CM | POA: Diagnosis not present

## 2018-03-29 DIAGNOSIS — E1122 Type 2 diabetes mellitus with diabetic chronic kidney disease: Secondary | ICD-10-CM | POA: Diagnosis not present

## 2018-03-29 DIAGNOSIS — N183 Chronic kidney disease, stage 3 (moderate): Secondary | ICD-10-CM | POA: Diagnosis not present

## 2018-03-29 DIAGNOSIS — I2510 Atherosclerotic heart disease of native coronary artery without angina pectoris: Secondary | ICD-10-CM | POA: Diagnosis not present

## 2018-03-29 DIAGNOSIS — I129 Hypertensive chronic kidney disease with stage 1 through stage 4 chronic kidney disease, or unspecified chronic kidney disease: Secondary | ICD-10-CM | POA: Diagnosis not present

## 2018-03-29 DIAGNOSIS — Z510 Encounter for antineoplastic radiation therapy: Secondary | ICD-10-CM | POA: Diagnosis not present

## 2018-03-29 DIAGNOSIS — C7951 Secondary malignant neoplasm of bone: Secondary | ICD-10-CM | POA: Diagnosis not present

## 2018-03-30 ENCOUNTER — Ambulatory Visit
Admission: RE | Admit: 2018-03-30 | Discharge: 2018-03-30 | Disposition: A | Discharge status: Home / Self Care | Payer: Medicare Other | Source: Ambulatory Visit | Attending: Radiation Oncology | Admitting: Radiation Oncology

## 2018-03-30 ENCOUNTER — Encounter: Payer: Self-pay | Admitting: Family Medicine

## 2018-03-30 ENCOUNTER — Ambulatory Visit (INDEPENDENT_AMBULATORY_CARE_PROVIDER_SITE_OTHER): Payer: Medicare Other | Admitting: Family Medicine

## 2018-03-30 VITALS — BP 122/70 | HR 80 | Temp 97.6°F | Resp 16 | Ht 66.0 in | Wt 146.0 lb

## 2018-03-30 DIAGNOSIS — Z510 Encounter for antineoplastic radiation therapy: Secondary | ICD-10-CM | POA: Diagnosis not present

## 2018-03-30 DIAGNOSIS — C7951 Secondary malignant neoplasm of bone: Secondary | ICD-10-CM

## 2018-03-30 DIAGNOSIS — C3491 Malignant neoplasm of unspecified part of right bronchus or lung: Secondary | ICD-10-CM | POA: Diagnosis not present

## 2018-03-30 DIAGNOSIS — E119 Type 2 diabetes mellitus without complications: Secondary | ICD-10-CM | POA: Diagnosis not present

## 2018-03-30 MED ORDER — TRAMADOL HCL 50 MG PO TABS: 50.0000 mg | ORAL_TABLET | Freq: Three times a day (TID) | ORAL | 0 refills | Status: DC | PRN

## 2018-03-30 NOTE — Progress Notes (Signed)
Subjective:    Patient ID: Martin [name], male    DOB: [birth year], 85 y.o.   MRN: [id number]  HPI  Patient was recently admitted with severe low back pain radiating down his left leg.  Unfortunately was found to be a bone metastasis ultimately due to underlying metastatic lung cancer.  I have copied relevant portions of the discharge summary and included them below for my reference:  Admit date: 03/15/2018 Discharge date: 03/18/2018  Admitted From: Home Disposition:  Home  Discharge Condition:Stable CODE STATUS:FULL Diet recommendation: Heart Healthy   Brief/Interim Summary: Patient is 85 year old male with past medical history of CVA, peripheral vascular disease with renal artery stenosis, Coronary artery disease with stents, hyperlipidemia, GERD, diabetes, COPD who presents with complaint of back pain. He was bothered with back painfor last 6 weeks. Imagings done here on presentationshowed cavitary spiculated 4.3x 1.6 cm left apical right upper lobe mass. Also showed lytic destruction in the right sacrum suggestive of metastatic disease. He was alsofound to have left fourth rib mass highly suspicious for metastatic disease. Pulmonology, oncology and IR consulted.IR  did chest wall mass biopsy today. Radiation oncology also consulted for radiation therapy for the sacral metastatic lesion.He will be undergoing simulation for radiation therapy early morning tomorrow and will be discharged home. He will follow up with oncology and radiation oncology as an outpatient.  Following problems were addressed during his hospitalization:  Metastatic lung cancer: Presented with right-sided sacrum pain, progressive inability to walk for the last 6 weeks. CT of the lumbar spine showed metastatic destructive lesion on the area of pain. CT chest showed partially cavitary irregular spiculated 4.3x 1.6 cm left apical right upper lobe mass suspicious for primary bronchogenic carcinoma. Also  found to have lateral left fourth rib mass which is apparently asymptomatic. Oncology consulted.Will be followed by oncology as an outpatient for PET scan and further management. Pulmonary evaluated the patient and concluded that bronchoscopic approach will be difficultfor the lung mass. IR consulted for biopsy of the chest wall mass. Underwent biopsy on 03/17/18. We have also consultedwithradiation oncology.Marland KitchenHe will be undergoing simulation for radiation therapy early morning tomorrow.He will be followed by radiation oncology as an outpatient.  Back pain: Seen byPT evaluation.Continue supportive care, pain medications. Home health on discharge.  Hypertension: Currently blood pressure stable. Continue current meds  CKD stage WEX:HBZJIRCVE kidney function on baseline.  COPD: Currently stable. Continue bronchodilators as needed.  Hyperlipidemia:Continue Lipitor  Diabetes mellitus:Last hemoglobin A1c was 6.3 as per 11/18.  Anxiety: Continue Xanax     Discharge Diagnoses:  Principal Problem:   Metastatic lung cancer (metastasis from lung to other site) [facility]) Active Problems:   HLD (hyperlipidemia)   TOBACCO ABUSE, HX OF   Diabetes mellitus, type 2 (HCC)   Atherosclerotic PVD with intermittent claudication (HCC)   Chronic obstructive pulmonary disease (HCC)   CKD (chronic kidney disease) stage 3, GFR 30-59 ml/min (HCC)   Essential hypertension   Anxiety state    Discharge Instructions      Discharge Instructions    Diet - low sodium heart healthy   Complete by:  As directed    Discharge instructions   Complete by:  As directed    1)Please take prescribed medications as instructed. 2)Follow up with your PCP as an outpatient. 3)You will be called by Oncology and Radiation Oncology for outpatient appointment. 4)Follow up with Home Health.   Increase activity slowly   Complete by:  As directed     Patient is here  today for follow-up.   He is actually doing quite well.  He is already received several treatments of radiation therapy for the bone metastasis.  This is improved his low back pain dramatically.  He continues to have some pain radiating into his right femur however this has improved dramatically on the radiation therapy.  He is asking for something that he can use for pain.  He tried hydrocodone however this left him terribly constipated.  He is wondering if there is something milder that he could take for breakthrough pain particularly at night.  Patient denies any shortness of breath.  He denies any chest pain.  He denies any hemoptysis.  He denies any pleurisy.  He is scheduled to see Dr. Earlie Server in February to discuss chemotherapy.    Patient is interested in chemotherapy as long as he is able to prolong his life and still have a good quality of life.  At the present time he denies any nausea or anxiety or insomnia.  He realizes that this is stage IV cancer and that it cannot be cured however his outlook is incredibly optimistic and his attitude is very upbeat and positive despite everything he has learned in the last few weeks. Past Medical History:  Diagnosis Date  . Arthritis   . CKD (chronic kidney disease) stage 3, GFR 30-59 ml/min (HCC)   . COPD (chronic obstructive pulmonary disease) ([place])   . Diabetes mellitus    denies, reports resolved with diet  . GERD (gastroesophageal reflux disease)   . H/O: GI bleed   . Hiatal hernia   . Hyperlipidemia   . Hypertension   . Myocardial infarction [facility])    1978 & 2011  . Peripheral vascular disease, unspecified ([place])   . Renal artery stenosis ([place])   . Stroke [facility])    Past Surgical History:  Procedure Laterality Date  . Aortobifemoral BPG  07/22/10   and Right femoral-popliteal BPG   Current Outpatient Medications on File Prior to Visit  Medication Sig Dispense Refill  . ALPRAZolam (XANAX) 0.5 MG tablet TAKE 1 TABLET BY MOUTH AT BEDTIME (Patient taking  differently: Take 0.5 mg by mouth at bedtime. ) 30 tablet 2  . atorvastatin (LIPITOR) 20 MG tablet TAKE 1 TABLET BY MOUTH ONCE DAILY AT  6PM. 90 tablet 0  . budesonide-formoterol (SYMBICORT) 160-4.5 MCG/ACT inhaler Inhale 2 puffs into the lungs 2 (two) times daily as needed (for COPD flares).    . clopidogrel (PLAVIX) 75 MG tablet Take 1 tablet (75 mg total) by mouth daily. 90 tablet 3  . famotidine (PEPCID) 20 MG tablet TAKE 1 TABLET BY MOUTH TWICE DAILY (Patient taking differently: Take 20 mg by mouth at bedtime. ) 60 tablet 5  . HYDROcodone-acetaminophen (NORCO/VICODIN) 5-325 MG tablet Take 1-2 tablets by mouth every 6 (six) hours as needed for moderate pain. 20 tablet 0  . ibuprofen (ADVIL,MOTRIN) 200 MG tablet Take 200 mg by mouth every 6 (six) hours as needed (for pain).    . metoprolol succinate (TOPROL-XL) 25 MG 24 hr tablet TAKE ONE TABLET BY MOUTH ONCE DAILY (Patient taking differently: Take 25 mg by mouth daily at 6 PM. ) 90 tablet 3  . NITROSTAT 0.4 MG SL tablet DISSOLVE ONE TABLET UNDER THE TONGUE AS DIRECTED (Patient taking differently: Place 0.4 mg under the tongue every 5 (five) minutes as needed for chest pain. ) 25 tablet 0  . polyethylene glycol (MIRALAX / GLYCOLAX) packet Take 17 g by mouth daily. 14 each 0  .  senna (SENOKOT) 8.6 MG TABS tablet Take 1 tablet (8.6 mg total) by mouth daily. 14 each 0   No current facility-administered medications on file prior to visit.    Allergies  Allergen Reactions  . Penicillins Rash    DID THE REACTION INVOLVE: Swelling of the face/tongue/throat, SOB, or low BP? Yes Sudden or severe rash/hives, skin peeling, or the inside of the mouth or nose? No Did it require medical treatment? Was already in hosp When did it last happen?40 years ago If all above answers are "NO", may proceed with cephalosporin use.    Social History   Socioeconomic History  . Marital status: Widowed    Spouse name: Not on file  . Number of children: Not on  file  . Years of education: Not on file  . Highest education level: Not on file  Occupational History  . Occupation: retired  Scientific laboratory technician  . Financial resource strain: Not on file  . Food insecurity:    Worry: Not on file    Inability: Not on file  . Transportation needs:    Medical: Not on file    Non-medical: Not on file  Tobacco Use  . Smoking status: Former Smoker    Packs/day: 1.00    Years: 65.00    Pack years: 65.00    Types: Cigarettes    Last attempt to quit: 07/08/2009    Years since quitting: 8.7  . Smokeless tobacco: Never Used  Substance and Sexual Activity  . Alcohol use: No  . Drug use: No  . Sexual activity: Not on file  Lifestyle  . Physical activity:    Days per week: Not on file    Minutes per session: Not on file  . Stress: Not on file  Relationships  . Social connections:    Talks on phone: Not on file    Gets together: Not on file    Attends religious service: Not on file    Active member of club or organization: Not on file    Attends meetings of clubs or organizations: Not on file    Relationship status: Not on file  . Intimate partner violence:    Fear of current or ex partner: Not on file    Emotionally abused: Not on file    Physically abused: Not on file    Forced sexual activity: Not on file  Other Topics Concern  . Not on file  Social History Narrative  . Not on file         Review of Systems     Objective:   Physical Exam Vitals signs reviewed.  Constitutional:      General: He is not in acute distress.    Appearance: Normal appearance. He is not ill-appearing, toxic-appearing or diaphoretic.  Cardiovascular:     Rate and Rhythm: Normal rate and regular rhythm.     Heart sounds: Normal heart sounds.  Pulmonary:     Effort: Pulmonary effort is normal.     Breath sounds: Normal breath sounds. No wheezing or rales.  Abdominal:     General: Bowel sounds are normal.     Palpations: Abdomen is soft.  Musculoskeletal:      Lumbar back: He exhibits pain. He exhibits normal range of motion, no tenderness, no swelling, no edema, no deformity and no spasm.       Legs:  Neurological:     Mental Status: He is alert.           Assessment &  Plan:  Controlled type 2 diabetes mellitus without complication, without long-term current use of insulin ([place]) - Plan: Hemoglobin A1c, CBC with Differential/Platelet, BASIC METABOLIC PANEL WITH GFR  Bone metastasis (HCC)  Primary malignant neoplasm of right lung metastatic to other site [facility])  Patient has neuropathic pain radiating down his right leg.  We will try tramadol 50 mg every 8 hours as needed for pain his hydrocodone was too strong.  He can use MiraLAX as needed for constipation.  I encouraged him to use this early if he feels like he is getting constipated.  Otherwise he is doing quite well.  His diabetes has not been addressed in quite some time.  Although this is not the most important issue for him right now we will check an A1c to ensure that his diabetes is not out of control to avoid any unnecessary complications in his cancer treatment

## 2018-03-31 ENCOUNTER — Ambulatory Visit (HOSPITAL_COMMUNITY): Payer: Medicare Other

## 2018-03-31 ENCOUNTER — Ambulatory Visit
Admission: RE | Admit: 2018-03-31 | Discharge: 2018-03-31 | Disposition: A | Discharge status: Home / Self Care | Payer: Medicare Other | Source: Ambulatory Visit | Attending: Radiation Oncology | Admitting: Radiation Oncology

## 2018-03-31 DIAGNOSIS — N183 Chronic kidney disease, stage 3 (moderate): Secondary | ICD-10-CM | POA: Diagnosis not present

## 2018-03-31 DIAGNOSIS — C7951 Secondary malignant neoplasm of bone: Secondary | ICD-10-CM | POA: Diagnosis not present

## 2018-03-31 DIAGNOSIS — I2510 Atherosclerotic heart disease of native coronary artery without angina pectoris: Secondary | ICD-10-CM | POA: Diagnosis not present

## 2018-03-31 DIAGNOSIS — I129 Hypertensive chronic kidney disease with stage 1 through stage 4 chronic kidney disease, or unspecified chronic kidney disease: Secondary | ICD-10-CM | POA: Diagnosis not present

## 2018-03-31 DIAGNOSIS — Z510 Encounter for antineoplastic radiation therapy: Secondary | ICD-10-CM | POA: Diagnosis not present

## 2018-03-31 DIAGNOSIS — E1122 Type 2 diabetes mellitus with diabetic chronic kidney disease: Secondary | ICD-10-CM | POA: Diagnosis not present

## 2018-03-31 DIAGNOSIS — C3400 Malignant neoplasm of unspecified main bronchus: Secondary | ICD-10-CM | POA: Diagnosis not present

## 2018-03-31 LAB — CBC WITH DIFFERENTIAL/PLATELET
Absolute Monocytes: 687 cells/uL (ref 200–950)
Basophils Absolute: 40 cells/uL (ref 0–200)
Basophils Relative: 0.5 %
Eosinophils Absolute: 190 cells/uL (ref 15–500)
Eosinophils Relative: 2.4 %
HCT: 37.6 % — ABNORMAL LOW (ref 38.5–50.0)
HEMOGLOBIN: 12.7 g/dL — AB (ref 13.2–17.1)
Lymphs Abs: 901 cells/uL (ref 850–3900)
MCH: 31.7 pg (ref 27.0–33.0)
MCHC: 33.8 g/dL (ref 32.0–36.0)
MCV: 93.8 fL (ref 80.0–100.0)
MPV: 11 fL (ref 7.5–12.5)
Monocytes Relative: 8.7 %
NEUTROS ABS: 6083 {cells}/uL (ref 1500–7800)
Neutrophils Relative %: 77 %
Platelets: 182 10*3/uL (ref 140–400)
RBC: 4.01 10*6/uL — ABNORMAL LOW (ref 4.20–5.80)
RDW: 11.8 % (ref 11.0–15.0)
Total Lymphocyte: 11.4 %
WBC: 7.9 10*3/uL (ref 3.8–10.8)

## 2018-03-31 LAB — HEMOGLOBIN A1C
EAG (MMOL/L): 8.1 (calc)
Hgb A1c MFr Bld: 6.7 % of total Hgb — ABNORMAL HIGH (ref <5.7)
Mean Plasma Glucose: 146 (calc)

## 2018-03-31 LAB — BASIC METABOLIC PANEL WITH GFR
BUN/Creatinine Ratio: 19 (calc) (ref 6–22)
BUN: 22 mg/dL (ref 7–25)
CO2: 26 mmol/L (ref 20–32)
CREATININE: 1.15 mg/dL — AB (ref 0.70–1.11)
Calcium: 8.7 mg/dL (ref 8.6–10.3)
Chloride: 103 mmol/L (ref 98–110)
GFR, Est African American: 67 mL/min/{1.73_m2} (ref >=60)
GFR, Est Non African American: 58 mL/min/{1.73_m2} — ABNORMAL LOW (ref >=60)
Glucose, Bld: 129 mg/dL — ABNORMAL HIGH (ref 65–99)
Potassium: 3.8 mmol/L (ref 3.5–5.3)
Sodium: 140 mmol/L (ref 135–146)

## 2018-04-01 ENCOUNTER — Ambulatory Visit (HOSPITAL_COMMUNITY)
Admission: RE | Admit: 2018-04-01 | Discharge: 2018-04-01 | Disposition: A | Discharge status: Home / Self Care | Payer: Medicare Other | Source: Ambulatory Visit | Attending: Internal Medicine | Admitting: Internal Medicine

## 2018-04-01 ENCOUNTER — Ambulatory Visit
Admission: RE | Admit: 2018-04-01 | Discharge: 2018-04-01 | Disposition: A | Discharge status: Home / Self Care | Payer: Medicare Other | Source: Ambulatory Visit | Attending: Radiation Oncology | Admitting: Radiation Oncology

## 2018-04-01 DIAGNOSIS — C3490 Malignant neoplasm of unspecified part of unspecified bronchus or lung: Secondary | ICD-10-CM | POA: Diagnosis not present

## 2018-04-01 DIAGNOSIS — Z510 Encounter for antineoplastic radiation therapy: Secondary | ICD-10-CM | POA: Diagnosis not present

## 2018-04-01 DIAGNOSIS — C7951 Secondary malignant neoplasm of bone: Secondary | ICD-10-CM | POA: Diagnosis not present

## 2018-04-01 LAB — GLUCOSE, CAPILLARY: Glucose-Capillary: 159 mg/dL — ABNORMAL HIGH (ref 70–99)

## 2018-04-01 MED ORDER — FLUDEOXYGLUCOSE F - 18 (FDG) INJECTION
7.2000 | Freq: Once | INTRAVENOUS | Status: AC
  Administered 2018-04-01: 7.2 via INTRAVENOUS

## 2018-04-02 ENCOUNTER — Encounter: Payer: Self-pay | Admitting: Radiation Oncology

## 2018-04-02 NOTE — Progress Notes (Signed)
  Radiation Oncology         [phone] ________________________________  Name: Martin [name] MRN: [id number]  Date: 04/02/2018  DOB: March 03, 1931  End of Treatment Note  Diagnosis:   85 y.o. gentleman with right sacral metastasis from presumed lung cancer primary     Indication for treatment:  Palliative       Radiation treatment dates:   03/19/2018 - 04/01/2018  Site/dose:   Right Sacrum / treated to 30 Gy delivered in 10 fractions of 3 Gy  Beams/energy:   3D, photons / 10X, 6X  Narrative: The patient tolerated radiation treatment relatively well. He experienced an increase in urinary frequency, mild fatigue, and constipation. He denied nausea or vomiting, focal weakness, and paresthesias. He reported a significant improvement in his back pain with treatments.  Plan: The patient has completed radiation treatment. The patient will return to radiation oncology clinic for routine followup in one month. I advised him to call or return sooner if he has any questions or concerns related to his recovery or treatment. ________________________________  Sheral Apley. Tammi Klippel, M.D.   This document serves as a record of services personally performed by Tyler Pita, MD. It was created on his behalf by Wilburn Mylar, a trained medical scribe. The creation of this record is based on the scribe's personal observations and the provider's statements to them. This document has been checked and approved by the attending provider.

## 2018-04-05 ENCOUNTER — Other Ambulatory Visit: Payer: Self-pay | Admitting: Family Medicine

## 2018-04-05 ENCOUNTER — Other Ambulatory Visit: Payer: Self-pay | Admitting: *Deleted

## 2018-04-05 DIAGNOSIS — N183 Chronic kidney disease, stage 3 (moderate): Secondary | ICD-10-CM | POA: Diagnosis not present

## 2018-04-05 DIAGNOSIS — I129 Hypertensive chronic kidney disease with stage 1 through stage 4 chronic kidney disease, or unspecified chronic kidney disease: Secondary | ICD-10-CM | POA: Diagnosis not present

## 2018-04-05 DIAGNOSIS — E1122 Type 2 diabetes mellitus with diabetic chronic kidney disease: Secondary | ICD-10-CM | POA: Diagnosis not present

## 2018-04-05 DIAGNOSIS — C3400 Malignant neoplasm of unspecified main bronchus: Secondary | ICD-10-CM | POA: Diagnosis not present

## 2018-04-05 DIAGNOSIS — C3490 Malignant neoplasm of unspecified part of unspecified bronchus or lung: Secondary | ICD-10-CM

## 2018-04-05 DIAGNOSIS — I2510 Atherosclerotic heart disease of native coronary artery without angina pectoris: Secondary | ICD-10-CM | POA: Diagnosis not present

## 2018-04-05 DIAGNOSIS — C7951 Secondary malignant neoplasm of bone: Secondary | ICD-10-CM | POA: Diagnosis not present

## 2018-04-05 NOTE — Telephone Encounter (Signed)
Ok to refill??  Last office visit 03/30/2018.  Last refill 01/05/2018, #2 refills.

## 2018-04-06 ENCOUNTER — Encounter: Payer: Self-pay | Admitting: *Deleted

## 2018-04-06 ENCOUNTER — Inpatient Hospital Stay: Payer: Medicare Other

## 2018-04-06 ENCOUNTER — Encounter: Payer: Self-pay | Admitting: Internal Medicine

## 2018-04-06 ENCOUNTER — Inpatient Hospital Stay: Payer: Medicare Other | Attending: Internal Medicine | Admitting: Internal Medicine

## 2018-04-06 VITALS — BP 147/63 | HR 83 | Temp 97.7°F | Resp 20 | Ht 66.0 in | Wt 144.8 lb

## 2018-04-06 DIAGNOSIS — C3490 Malignant neoplasm of unspecified part of unspecified bronchus or lung: Secondary | ICD-10-CM

## 2018-04-06 DIAGNOSIS — C3411 Malignant neoplasm of upper lobe, right bronchus or lung: Secondary | ICD-10-CM | POA: Insufficient documentation

## 2018-04-06 DIAGNOSIS — Z5111 Encounter for antineoplastic chemotherapy: Secondary | ICD-10-CM

## 2018-04-06 DIAGNOSIS — R5382 Chronic fatigue, unspecified: Secondary | ICD-10-CM

## 2018-04-06 DIAGNOSIS — Z7189 Other specified counseling: Secondary | ICD-10-CM

## 2018-04-06 DIAGNOSIS — C7951 Secondary malignant neoplasm of bone: Secondary | ICD-10-CM | POA: Diagnosis not present

## 2018-04-06 DIAGNOSIS — C3491 Malignant neoplasm of unspecified part of right bronchus or lung: Secondary | ICD-10-CM

## 2018-04-06 DIAGNOSIS — Z5112 Encounter for antineoplastic immunotherapy: Secondary | ICD-10-CM | POA: Insufficient documentation

## 2018-04-06 LAB — CBC WITH DIFFERENTIAL (CANCER CENTER ONLY)
Abs Immature Granulocytes: 0.03 10*3/uL (ref 0.00–0.07)
Basophils Absolute: 0 10*3/uL (ref 0.0–0.1)
Basophils Relative: 0 %
Eosinophils Absolute: 0.1 10*3/uL (ref 0.0–0.5)
Eosinophils Relative: 1 %
HCT: 37 % — ABNORMAL LOW (ref 39.0–52.0)
Hemoglobin: 12 g/dL — ABNORMAL LOW (ref 13.0–17.0)
Immature Granulocytes: 0 %
LYMPHS PCT: 9 %
Lymphs Abs: 0.7 10*3/uL (ref 0.7–4.0)
MCH: 31.9 pg (ref 26.0–34.0)
MCHC: 32.4 g/dL (ref 30.0–36.0)
MCV: 98.4 fL (ref 80.0–100.0)
MONOS PCT: 7 %
Monocytes Absolute: 0.5 10*3/uL (ref 0.1–1.0)
Neutro Abs: 5.9 10*3/uL (ref 1.7–7.7)
Neutrophils Relative %: 83 %
Platelet Count: 191 10*3/uL (ref 150–400)
RBC: 3.76 MIL/uL — ABNORMAL LOW (ref 4.22–5.81)
RDW: 12.9 % (ref 11.5–15.5)
WBC: 7.2 10*3/uL (ref 4.0–10.5)
nRBC: 0 % (ref 0.0–0.2)

## 2018-04-06 LAB — CMP (CANCER CENTER ONLY)
ALT: 16 U/L (ref 0–44)
AST: 13 U/L — ABNORMAL LOW (ref 15–41)
Albumin: 3.1 g/dL — ABNORMAL LOW (ref 3.5–5.0)
Alkaline Phosphatase: 94 U/L (ref 38–126)
Anion gap: 10 (ref 5–15)
BUN: 25 mg/dL — ABNORMAL HIGH (ref 8–23)
CALCIUM: 9.4 mg/dL (ref 8.9–10.3)
CO2: 29 mmol/L (ref 22–32)
Chloride: 99 mmol/L (ref 98–111)
Creatinine: 1.35 mg/dL — ABNORMAL HIGH (ref 0.61–1.24)
GFR, EST NON AFRICAN AMERICAN: 48 mL/min — AB (ref >60)
GFR, Est AFR Am: 55 mL/min — ABNORMAL LOW (ref >60)
Glucose, Bld: 191 mg/dL — ABNORMAL HIGH (ref 70–99)
Potassium: 4 mmol/L (ref 3.5–5.1)
Sodium: 138 mmol/L (ref 135–145)
Total Bilirubin: 0.6 mg/dL (ref 0.3–1.2)
Total Protein: 7 g/dL (ref 6.5–8.1)

## 2018-04-06 MED ORDER — PROCHLORPERAZINE MALEATE 10 MG PO TABS: 10.0000 mg | ORAL_TABLET | Freq: Four times a day (QID) | ORAL | 0 refills | Status: DC | PRN

## 2018-04-06 NOTE — Progress Notes (Signed)
[place] [phone]) [phone]   Fax:([phone]) [phone]  OFFICE PROGRESS NOTE  Susy Frizzle, MD [address] [place] [address]  DIAGNOSIS: Stage IV (T2b, N0, M1C) non-small cell carcinoma presented with right upper lobe lung mass in addition to metastatic disease to the left fourth rib as well as right sacral area and pleural-based nodularity.  This was diagnosed in January 2020  PRIOR THERAPY: Palliative radiotherapy to the right sacral metastatic bone lesion under the care of Dr. Tammi Klippel  CURRENT THERAPY: Systemic chemotherapy with carboplatin for AUC of 5, paclitaxel 175 mg/M2 and Keytruda 200 mg IV every 3 weeks with Neulasta support  INTERVAL HISTORY: Martin [name] 85 y.o. male returns to the clinic today for hospital follow-up visit.  The patient was seen during his hospitalization before the initial diagnosis of lung cancer.  He was admitted with back pain and imaging studies showed large lytic sacral lesion.  CT scan of the chest at that time showed right upper lobe lung mass as well as chest wall left fourth rib mass.  He had MRI of the brain at that time that showed no evidence for metastatic disease to the brain.  The patient had CT-guided core biopsy of the left fourth rib mass lesion by interventional audiology and the final pathology was consistent with metastatic carcinoma of unknown specified histology.  He underwent palliative radiotherapy under the care of Dr. Tammi Klippel to the right sacral metastatic bone lesion.  His pain has significantly improved in the past but the patient started having some increasing pain in the left chest wall area.  He denied having any shortness of breath, cough or hemoptysis.  He denied having any nausea, vomiting, diarrhea or constipation.  He has no headache or visual changes.  He had a PET scan performed recently.  He is here today for evaluation and discussion of his treatment options.   MEDICAL HISTORY: Past  Medical History:  Diagnosis Date  . Arthritis   . CKD (chronic kidney disease) stage 3, GFR 30-59 ml/min (HCC)   . COPD (chronic obstructive pulmonary disease) ([place])   . Diabetes mellitus    denies, reports resolved with diet  . GERD (gastroesophageal reflux disease)   . H/O: GI bleed   . Hiatal hernia   . Hyperlipidemia   . Hypertension   . Myocardial infarction [facility])    1978 & 2011  . Peripheral vascular disease, unspecified ([place])   . Renal artery stenosis ([place])   . Stroke [facility])     ALLERGIES:  is allergic to penicillins.  MEDICATIONS:  Current Outpatient Medications  Medication Sig Dispense Refill  . ALPRAZolam (XANAX) 0.5 MG tablet TAKE 1 TABLET BY MOUTH AT BEDTIME 30 tablet 2  . atorvastatin (LIPITOR) 20 MG tablet TAKE 1 TABLET BY MOUTH ONCE DAILY AT  6PM. 90 tablet 0  . budesonide-formoterol (SYMBICORT) 160-4.5 MCG/ACT inhaler Inhale 2 puffs into the lungs 2 (two) times daily as needed (for COPD flares).    . clopidogrel (PLAVIX) 75 MG tablet Take 1 tablet (75 mg total) by mouth daily. 90 tablet 3  . famotidine (PEPCID) 20 MG tablet TAKE 1 TABLET BY MOUTH TWICE DAILY (Patient taking differently: Take 20 mg by mouth at bedtime. ) 60 tablet 5  . HYDROcodone-acetaminophen (NORCO/VICODIN) 5-325 MG tablet Take 1-2 tablets by mouth every 6 (six) hours as needed for moderate pain. 20 tablet 0  . ibuprofen (ADVIL,MOTRIN) 200 MG tablet Take 200 mg by  mouth every 6 (six) hours as needed (for pain).    . metoprolol succinate (TOPROL-XL) 25 MG 24 hr tablet TAKE ONE TABLET BY MOUTH ONCE DAILY (Patient taking differently: Take 25 mg by mouth daily at 6 PM. ) 90 tablet 3  . NITROSTAT 0.4 MG SL tablet DISSOLVE ONE TABLET UNDER THE TONGUE AS DIRECTED (Patient taking differently: Place 0.4 mg under the tongue every 5 (five) minutes as needed for chest pain. ) 25 tablet 0  . polyethylene glycol (MIRALAX / GLYCOLAX) packet Take 17 g by mouth daily. 14 each 0  . senna (SENOKOT) 8.6 MG TABS tablet  Take 1 tablet (8.6 mg total) by mouth daily. 14 each 0  . traMADol (ULTRAM) 50 MG tablet Take 1 tablet (50 mg total) by mouth every 8 (eight) hours as needed. 30 tablet 0   No current facility-administered medications for this visit.     SURGICAL HISTORY:  Past Surgical History:  Procedure Laterality Date  . Aortobifemoral BPG  07/22/10   and Right femoral-popliteal BPG    REVIEW OF SYSTEMS:  Constitutional: positive for fatigue Eyes: negative Ears, nose, mouth, throat, and face: negative Respiratory: positive for pleurisy/chest pain Cardiovascular: negative Gastrointestinal: negative Genitourinary:negative Integument/breast: negative Hematologic/lymphatic: negative Musculoskeletal:positive for bone pain Neurological: negative Behavioral/Psych: negative Endocrine: negative Allergic/Immunologic: negative   PHYSICAL EXAMINATION: General appearance: alert, cooperative, fatigued and no distress Head: Normocephalic, without obvious abnormality, atraumatic Neck: no adenopathy, no JVD, supple, symmetrical, trachea midline and thyroid not enlarged, symmetric, no tenderness/mass/nodules Lymph nodes: Cervical, supraclavicular, and axillary nodes normal. Resp: clear to auscultation bilaterally Back: symmetric, no curvature. ROM normal. No CVA tenderness. Cardio: regular rate and rhythm, S1, S2 normal, no murmur, click, rub or gallop GI: soft, non-tender; bowel sounds normal; no masses,  no organomegaly Extremities: extremities normal, atraumatic, no cyanosis or edema Neurologic: Alert and oriented X 3, normal strength and tone. Normal symmetric reflexes. Normal coordination and gait  ECOG PERFORMANCE STATUS: 1 - Symptomatic but completely ambulatory  Blood pressure (!) 147/63, pulse 83, temperature 97.7 F (36.5 C), temperature source Oral, resp. rate 20, height 5\' 6"  (1.676 m), weight 144 lb 12.8 oz (65.7 kg), SpO2 97 %.  LABORATORY DATA: Lab Results  Component Value Date   WBC  7.2 04/06/2018   HGB 12.0 (L) 04/06/2018   HCT 37.0 (L) 04/06/2018   MCV 98.4 04/06/2018   PLT 191 04/06/2018      Chemistry      Component Value Date/Time   NA 140 03/30/2018 1239   K 3.8 03/30/2018 1239   CL 103 03/30/2018 1239   CO2 26 03/30/2018 1239   BUN 22 03/30/2018 1239   CREATININE 1.15 (H) 03/30/2018 1239      Component Value Date/Time   CALCIUM 8.7 03/30/2018 1239   ALKPHOS 69 09/27/2015 1512   AST 24 01/23/2017 1526   ALT 20 01/23/2017 1526   BILITOT 0.9 01/23/2017 1526       RADIOGRAPHIC STUDIES: Dg Chest 2 View  Result Date: 03/16/2018 CLINICAL DATA:  Back pain, right leg pain. EXAM: CHEST - 2 VIEW COMPARISON:  08/24/2010 FINDINGS: Biapical pleural/parenchymal scarring. Heart is normal size. No confluent airspace opacities or effusions. No acute bony abnormality. IMPRESSION: Biapical scarring.  No active disease. Electronically Signed   By: Rolm Baptise M.D.   On: 03/16/2018 08:56   Ct Chest Wo Contrast  Result Date: 03/16/2018 CLINICAL DATA:  Lytic destructive lesion in the right sacrum on CT angiogram performed earlier today. EXAM: CT CHEST WITHOUT  CONTRAST TECHNIQUE: Multidetector CT imaging of the chest was performed following the standard protocol without IV contrast. COMPARISON:  03/16/2018 CT angiogram of the abdomen and pelvis with lower extremity runoff. FINDINGS: Cardiovascular: Normal heart size. No significant pericardial effusion/thickening. Three-vessel coronary atherosclerosis. Atherosclerotic nonaneurysmal thoracic aorta. Normal caliber pulmonary arteries. Mediastinum/Nodes: No discrete thyroid nodules. Unremarkable esophagus. No pathologically enlarged axillary, mediastinal or hilar lymph nodes, noting limited sensitivity for the detection of hilar adenopathy on this noncontrast study. Lungs/Pleura: No pneumothorax. No pleural effusion. Mild centrilobular and paraseptal emphysema with diffuse bronchial wall thickening. There is an irregular spiculated  4.3 x 1.6 cm apical right upper lobe lung mass (series 4/image 21) with cavitary change inferiorly. A few clustered small solid pulmonary nodules are noted in the apical left upper lobe, largest 4 mm (series 4/image 21). There is scattered mild cylindrical bronchiectasis in both lungs, most prominent in the left lower lobe. Upper abdomen: Cholelithiasis. Simple 2.0 cm medial upper left renal cyst. Partially visualized abdominal aortic surgical bypass graft. Musculoskeletal: Lytic destructive expansile 5.0 x 3.1 cm lateral left fourth rib mass (series 4/image 45). No additional focal osseous lesions in the chest. Mild thoracic spondylosis. IMPRESSION: 1. Partially cavitary irregular spiculated 4.3 x 1.6 cm apical right upper lobe lung mass. Given the suspected osseous metastases, this is suspicious for a primary bronchogenic carcinoma. Multidisciplinary thoracic oncology consultation suggested. PET-CT is suggested for further characterization and staging evaluation. 2. No thoracic adenopathy. 3. Lytic destructive expansile lateral left fourth rib mass most compatible with metastasis. 4. Clustered small solid pulmonary nodules in the apical left upper lobe, largest 4 mm, for which follow-up chest CT is advised in 3 months. 5. Scattered mild cylindrical bronchiectasis in both lungs, most prominent in the left lower lobe. 6. Three-vessel coronary atherosclerosis. 7. Cholelithiasis. Aortic Atherosclerosis (ICD10-I70.0) and Emphysema (ICD10-J43.9). Electronically Signed   By: Ilona Sorrel M.D.   On: 03/16/2018 09:36   Ct Lumbar Spine Wo Contrast  Result Date: 03/16/2018 CLINICAL DATA:  Back pain EXAM: CT LUMBAR SPINE WITHOUT CONTRAST TECHNIQUE: Multidetector CT imaging of the lumbar spine was performed without intravenous contrast administration. Multiplanar CT image reconstructions were also generated. COMPARISON:  CTA 02/19/2011 FINDINGS: Segmentation: Normal Alignment: Mild dextroscoliosis at L3-4.  Normal sagittal  alignment. Vertebrae: Destructive mass lesion in the right sacrum measuring 3.5 cm. This was not present 2012 and is most consistent with metastatic disease. No other destructive bone lesions identified. Paraspinal and other soft tissues: Atherosclerotic disease. Vascular findings reported separately from CTA today. No Peri spinal mass or adenopathy. Coronary calcification.  Lung bases clear. Disc levels: T12-L1: Negative L1-2: Negative L2-3: Mild disc bulging and mild facet degeneration. Negative for stenosis L3-4: Asymmetric advanced disc degeneration on the left with disc space narrowing and spurring. Bilateral facet degeneration. Negative for stenosis L4-5: Moderate disc and facet degeneration.  Mild spinal stenosis. L5-S1: Asymmetric facet degeneration on the right with mild subarticular stenosis on the right due to spurring. IMPRESSION: Destructive mass lesion right sacrum, probable metastatic disease. No other bone lesions Lumbar scoliosis and multilevel degenerative changes above. These results were called by telephone at the time of interpretation on 03/16/2018 at 7:57 am to Dr. Regenia Skeeter, who verbally acknowledged these results. Electronically Signed   By: Franchot Gallo M.D.   On: 03/16/2018 07:57   Ct Guided Needle Placement  Result Date: 03/18/2018 INDICATION: 85 year old with destructive bone lesions in the right sacrum and left fourth rib. There is also a suspicious lesion in the right lung. Findings are  concerning for metastatic disease. Tissue diagnosis is needed. EXAM: CT-GUIDED CORE BIOPSY OF LEFT FOURTH RIB LESION MEDICATIONS: None. ANESTHESIA/SEDATION: Moderate (conscious) sedation was employed during this procedure. A total of Versed 1.0 mg and Fentanyl 50 mcg was administered intravenously. Moderate Sedation Time: 21 minutes. The patient's level of consciousness and vital signs were monitored continuously by radiology nursing throughout the procedure under my direct supervision. FLUOROSCOPY  TIME:  None COMPLICATIONS: None immediate. PROCEDURE: Informed written consent was obtained from the patient after a thorough discussion of the procedural risks, benefits and alternatives. All questions were addressed. A timeout was performed prior to the initiation of the procedure. Patient was placed supine on the CT scanner with the left arm elevated. Images through the chest were obtained. The destructive lesion involving the lateral left chest wall and left fourth rib was identified. The left axilla was prepped with chlorhexidine and sterile field was created. Skin and soft tissues were anesthetized with 1% lidocaine. 17 gauge coaxial needle directed into the rib lesion using CT guidance. Total of 5 core biopsies were obtained with an 18 gauge core device. Small pieces of core biopsy were obtained and placed in formalin. Needle was removed without complication. Bandage placed over the puncture site. FINDINGS: Destructive soft tissue lesion along the lateral left chest wall involving the left fourth rib. Needle position confirmed within the lesion. Small amount of core biopsy material was obtained. IMPRESSION: CT-guided core biopsies of the left chest wall/fourth rib lesion. Electronically Signed   By: Markus Daft M.D.   On: 03/18/2018 16:10   Mr Jeri Cos WG Contrast  Result Date: 03/18/2018 CLINICAL DATA:  Newly discovered lung mass.  Lung cancer staging. EXAM: MRI HEAD WITHOUT AND WITH CONTRAST TECHNIQUE: Multiplanar, multiecho pulse sequences of the brain and surrounding structures were obtained without and with intravenous contrast. CONTRAST:  6.5 mL Gadavist COMPARISON:  Head CT 07/18/2015 and MRI 07/15/2015 FINDINGS: Brain: There is no evidence of acute infarct, midline shift, or extra-axial fluid collection. A large chronic right cerebellar infarct is noted with minimal chronic blood products. Chronic left basal ganglia, pontine, and right thalamic lacunar infarcts are again noted. Small foci of T2  hyperintensity in the cerebral white matter bilaterally and pons are nonspecific but compatible with mild chronic small vessel ischemic disease at most slightly progressed since the prior MRI. There is minimal non masslike enhancement associated with the chronic right cerebellar infarct. No abnormal brain parenchymal or meningeal enhancement suggestive of metastatic disease is identified. Cerebral atrophy is mild for age. Vascular: Major intracranial vascular flow voids are preserved. Skull and upper cervical spine: No suspicious marrow lesion. Moderate ligamentous hypertrophy posterior to the dens. Sinuses/Orbits: Bilateral cataract extraction. Clear paranasal sinuses. Trace mastoid effusions. Other: None. IMPRESSION: 1. No evidence of intracranial metastatic disease. 2. Chronic ischemic changes including a large old cerebellar infarct as detailed above. Electronically Signed   By: Logan Bores M.D.   On: 03/18/2018 13:55   Ct Angio Aortobifemoral W And/or Wo Contrast  Result Date: 03/16/2018 CLINICAL DATA:  Back pain.  Right leg pain. EXAM: CT ANGIOGRAPHY OF ABDOMINAL AORTA WITH ILIOFEMORAL RUNOFF TECHNIQUE: Multidetector CT imaging of the abdomen, pelvis and lower extremities was performed using the standard protocol during bolus administration of intravenous contrast. Multiplanar CT image reconstructions and MIPs were obtained to evaluate the vascular anatomy. CONTRAST:  12mL ISOVUE-370 IOPAMIDOL (ISOVUE-370) INJECTION 76% COMPARISON:  02/19/2011 FINDINGS: VASCULAR Aorta: Prior aorto bi femoral bypass. Negative aorta is heavily calcified. No aneurysm or dissection. Celiac:  Calcified plaque in the proximal celiac artery without significant stenosis. SMA: Calcified plaque throughout the vessel, patent. Renals: Probable ostial stenoses bilaterally. IMA: Fills via collaterals. RIGHT Lower Extremity Inflow: Iliofemoral bypass is patent.  No aneurysm or dissection. Outflow: Heavily diseased/calcified common  femoral artery. Runoff: Heavily calcified and diseased superficial femoral artery proximally. This occludes in the proximal to mid superficial femoral artery. There is a reported fem-pop bypass graft on the right which is not visualized, presumably occluded. Profunda is disease proximally but patent. Reconstitution of the distal popliteal artery. Proximal trifurcation vessels appear patent but very small and difficult to visualize below the mid calf. LEFT Lower Extremity Inflow: Aortofemoral bypass is patent. Outflow: Common femoral artery is diseased and calcified. Runoff: Occlusion of the left superficial femoral artery proximally. Profunda is patent. Reconstitution of the popliteal artery above the knee with moderate disease. Proximal trifurcation vessels are visualized and patent. Difficult to visualize below the mid calf. Veins: Grossly patent. Review of the MIP images confirms the above findings. NON-VASCULAR Lower chest: No acute abnormality. Hepatobiliary: Small layering gallstones in the gallbladder. No focal hepatic abnormality. Pancreas: No focal abnormality or ductal dilatation. Spleen: No focal abnormality.  Normal size. Adrenals/Urinary Tract: Areas of decreased enhancement and cortical thinning in the lower poles of both kidneys, likely related to renovascular disease. No hydronephrosis or suspicious renal mass. Adrenal glands and urinary bladder unremarkable. Stomach/Bowel: Normal appendix. Left colonic diverticulosis. No active diverticulitis. No evidence of bowel obstruction. Lymphatic: No adenopathy. Reproductive: Prostate enlargement Other: No free fluid or free air. Musculoskeletal: There is a lytic destructive expansile lesion involving much of the right side of the sacrum measuring up to 4.2 cm on image 108. IMPRESSION: VASCULAR Prior aortobifemoral bypass which is patent. Heavily diseased and occluded superficial femoral arteries bilaterally, in the upper thigh on the left and upper to mid  thigh on the right. Reconstitution of the right popliteal artery below the knee which is heavily diseased. Proximal trifurcation vessels are patent but difficult to visualize below the mid calf. No visible patent bypass graft. Reconstitution of the left popliteal artery above the knee with moderate disease. Proximal trifurcation vessels are patent but difficult to visualize below the mid calf. NON-VASCULAR Lytic destructive lesion within the right sacrum concerning for metastasis or myeloma. Enlarged prostate. Left colonic diverticulosis.  No active diverticulitis. Layering gallstones within the gallbladder. These results were called by telephone at the time of interpretation on 03/16/2018 at 8:28 am to Dr. Regenia Skeeter , who verbally acknowledged these results. Electronically Signed   By: Rolm Baptise M.D.   On: 03/16/2018 08:29   Nm Pet Image Initial (pi) Skull Base To Thigh  Result Date: 04/01/2018 CLINICAL DATA:  Initial treatment strategy for lung cancer. EXAM: NUCLEAR MEDICINE PET SKULL BASE TO THIGH TECHNIQUE: 7.2 mCi F-18 FDG was injected intravenously. Full-ring PET imaging was performed from the skull base to thigh after the radiotracer. CT data was obtained and used for attenuation correction and anatomic localization. Fasting blood glucose: 159 mg/dl COMPARISON:  Multiple exams, including CT chest from 03/16/2018 FINDINGS: Mediastinal blood pool activity: SUV max 2.7 NECK: Expected photopenia associated with the right inferior medial cerebellar encephalomalacia. Incidental CT findings: Bilateral common carotid atherosclerotic calcification. CHEST: Irregular right apical mass, about 4.9 by 1.2 cm, maximum SUV 6.5 along the upper pleural margin. Destructive lesion of the left fourth rib with associated pleural mass, approximately 5.1 by 2.9 cm, maximum SUV 12.8. Adjacent hypermetabolic pleural nodularity in the fourth intercostal region posteriorly, maximum SUV  6.6. Incidental CT findings: Coronary, aortic  arch, and branch vessel atherosclerotic vascular disease. Trace left pleural effusion. Mild airway plugging in the left lower lobe. ABDOMEN/PELVIS: No significant abnormal hypermetabolic activity in this region. Incidental CT findings: Small gallstones are present. Aorta bi-iliac graft. Sigmoid colon diverticulosis. Moderate prostatomegaly. SKELETON: 4.8 by 3.7 cm lytic/destructive lesion of the right sacral ala, maximum SUV 6.1, impingement on the right sacral plexus not excluded. There is a small central lucency in the right fourth rib without accentuated metabolic activity, significance uncertain. Incidental CT findings: Degenerative glenohumeral arthropathy bilaterally. IMPRESSION: 1. Right apical mass measures 4.9 by 1.2 cm and extends to the pleural surface, maximum SUV 6.5, concerning for Pancoast tumor. 2. Destructive lesion of the left fourth rib, maximum SUV 12.8, with extensive soft tissue component. There is also hypermetabolic nodularity along the left pleura posterior to this lesion, and a trace left pleural effusion. 3. 4.8 by 3.7 cm lytic lesion of the right sacral ala, maximum SUV 6.1, favoring metastatic disease. A possibility of myeloma is not completely excluded, and serologic tests for myeloma would be prudent. 4. Other imaging findings of potential clinical significance: Aortic Atherosclerosis (ICD10-I70.0). Coronary atherosclerosis. Mild airway plugging in the left lower lobe. Cholelithiasis. Aorta bi-iliac graft. Sigmoid colon diverticulosis. Moderate cardiomegaly. Degenerative glenohumeral arthropathy bilaterally. Old right inferior cerebellar stroke. Electronically Signed   By: Van Clines M.D.   On: 04/01/2018 16:20   Dg Chest Port 1 View  Result Date: 03/18/2018 CLINICAL DATA:  Rib lesion EXAM: PORTABLE CHEST 1 VIEW COMPARISON:  03/16/2018 FINDINGS: The destructive mass involving the left fourth rib is stable. Lungs are otherwise clear. Normal heart size. No pneumothorax.  IMPRESSION: No pneumothorax post left rib lesion biopsy. Electronically Signed   By: Marybelle Killings M.D.   On: 03/18/2018 17:12    ASSESSMENT AND PLAN: This is a very pleasant 85 years old white male with metastatic non-small cell lung cancer of undifferentiated histology presented with right upper lobe lung mass in addition to left chest wall mass lesion as well as metastatic sacral lytic lesion diagnosed in January 2020. He had a PET scan performed recently.  I personally and independently reviewed the scan images and discussed the results with the patient today. I had a lengthy discussion with the patient about his current disease status and treatment options.  He understands that he has incurable condition and all the treatment will be of palliative nature. The patient underwent palliative radiotherapy to the right sacral area and he has improvement in his pain.  He started having some pain on the left chest wall secondary to the metastatic disease in the left fourth rib.  I would consider referring the patient to Dr. Tammi Klippel if needed for management of this pain issues. I discussed with the patient his treatment options including palliative care versus palliative systemic chemotherapy with carboplatin for AUC of 5, paclitaxel 175 mg/M2 and Keytruda 200 mg IV every 3 weeks.  The patient is interested in proceeding with systemic chemotherapy.  I discussed with him the adverse effect of this treatment including but not limited to alopecia, myelosuppression, nausea and vomiting, peripheral neuropathy, liver or renal dysfunction. I will arrange for the patient have a chemotherapy education class before the first dose of his treatment. I will call his pharmacy with prescription for Compazine 10 mg p.o. every 6 hours as needed for nausea. He is expected to start the first cycle of his treatment next week. I will see the patient back for follow-up  visit in 2 weeks for evaluation and management of any adverse  effect of his treatment. The patient was advised to call immediately if he has any concerning symptoms in the interval. The patient voices understanding of current disease status and treatment options and is in agreement with the current care plan. All questions were answered. The patient knows to call the clinic with any problems, questions or concerns. We can certainly see the patient much sooner if necessary.  I spent 20 minutes counseling the patient face to face. The total time spent in the appointment was 30 minutes.  Disclaimer: This note was dictated with voice recognition software. Similar sounding words can inadvertently be transcribed and may not be corrected upon review.

## 2018-04-06 NOTE — Progress Notes (Signed)
START ON PATHWAY REGIMEN - Non-Small Cell Lung     A cycle is every 21 days:     Pembrolizumab      Paclitaxel      Carboplatin   **Always confirm dose/schedule in your pharmacy ordering system**  Patient Characteristics: Stage IV Metastatic, Squamous, PS = 0, 1, First Line, PD-L1 Expression Positive 1-49% (TPS) / Negative / Not Tested / Awaiting Test Results and Immunotherapy Candidate AJCC T Category: T2b Current Disease Status: Distant Metastases AJCC N Category: N0 AJCC M Category: M1c AJCC 8 Stage Grouping: IVB Histology: Squamous Cell Line of therapy: First Line PD-L1 Expression Status: Awaiting Test Results Performance Status: PS = 0, 1 Immunotherapy Candidate Status: Candidate for Immunotherapy Intent of Therapy: Non-Curative / Palliative Intent, Discussed with Patient

## 2018-04-06 NOTE — Progress Notes (Signed)
Oncology Nurse Navigator Documentation  Oncology Nurse Navigator Flowsheets 04/06/2018  Navigator Location [facility]  Navigator Encounter Type Other/per Dr. Julien Nordmann, I requested PDL 1 to be sent on tissue obtained on 03/18/2018.   Barriers/Navigation Needs Coordination of Care  Interventions Coordination of Care  Coordination of Care Other  Acuity Level 1  Time Spent with Patient 15

## 2018-04-07 ENCOUNTER — Other Ambulatory Visit (HOSPITAL_COMMUNITY)
Admission: RE | Admit: 2018-04-07 | Discharge: 2018-04-07 | Disposition: A | Discharge status: Home / Self Care | Payer: Medicare Other | Source: Ambulatory Visit | Attending: Internal Medicine | Admitting: Internal Medicine

## 2018-04-07 ENCOUNTER — Other Ambulatory Visit: Payer: Self-pay | Admitting: Internal Medicine

## 2018-04-07 DIAGNOSIS — C7951 Secondary malignant neoplasm of bone: Secondary | ICD-10-CM | POA: Diagnosis not present

## 2018-04-07 DIAGNOSIS — C3491 Malignant neoplasm of unspecified part of right bronchus or lung: Secondary | ICD-10-CM | POA: Insufficient documentation

## 2018-04-07 MED ORDER — HYDROCODONE-ACETAMINOPHEN 5-325 MG PO TABS: 1.0000 | ORAL_TABLET | Freq: Four times a day (QID) | ORAL | 0 refills | Status: DC | PRN

## 2018-04-08 ENCOUNTER — Telehealth: Payer: Self-pay | Admitting: Internal Medicine

## 2018-04-08 DIAGNOSIS — E1122 Type 2 diabetes mellitus with diabetic chronic kidney disease: Secondary | ICD-10-CM | POA: Diagnosis not present

## 2018-04-08 DIAGNOSIS — I129 Hypertensive chronic kidney disease with stage 1 through stage 4 chronic kidney disease, or unspecified chronic kidney disease: Secondary | ICD-10-CM | POA: Diagnosis not present

## 2018-04-08 DIAGNOSIS — C3400 Malignant neoplasm of unspecified main bronchus: Secondary | ICD-10-CM | POA: Diagnosis not present

## 2018-04-08 DIAGNOSIS — I2510 Atherosclerotic heart disease of native coronary artery without angina pectoris: Secondary | ICD-10-CM | POA: Diagnosis not present

## 2018-04-08 DIAGNOSIS — N183 Chronic kidney disease, stage 3 (moderate): Secondary | ICD-10-CM | POA: Diagnosis not present

## 2018-04-08 DIAGNOSIS — C7951 Secondary malignant neoplasm of bone: Secondary | ICD-10-CM | POA: Diagnosis not present

## 2018-04-08 NOTE — Telephone Encounter (Signed)
Scheduled appt per 1/28 los - pt is aware of appt date and time

## 2018-04-11 ENCOUNTER — Other Ambulatory Visit: Payer: Self-pay | Admitting: Family Medicine

## 2018-04-13 DIAGNOSIS — C3400 Malignant neoplasm of unspecified main bronchus: Secondary | ICD-10-CM | POA: Diagnosis not present

## 2018-04-13 DIAGNOSIS — I129 Hypertensive chronic kidney disease with stage 1 through stage 4 chronic kidney disease, or unspecified chronic kidney disease: Secondary | ICD-10-CM | POA: Diagnosis not present

## 2018-04-13 DIAGNOSIS — N183 Chronic kidney disease, stage 3 (moderate): Secondary | ICD-10-CM | POA: Diagnosis not present

## 2018-04-13 DIAGNOSIS — C7951 Secondary malignant neoplasm of bone: Secondary | ICD-10-CM | POA: Diagnosis not present

## 2018-04-13 DIAGNOSIS — E1122 Type 2 diabetes mellitus with diabetic chronic kidney disease: Secondary | ICD-10-CM | POA: Diagnosis not present

## 2018-04-13 DIAGNOSIS — I2510 Atherosclerotic heart disease of native coronary artery without angina pectoris: Secondary | ICD-10-CM | POA: Diagnosis not present

## 2018-04-14 ENCOUNTER — Inpatient Hospital Stay: Payer: Medicare Other | Attending: Internal Medicine

## 2018-04-14 ENCOUNTER — Inpatient Hospital Stay: Payer: Medicare Other

## 2018-04-14 DIAGNOSIS — C7951 Secondary malignant neoplasm of bone: Secondary | ICD-10-CM | POA: Insufficient documentation

## 2018-04-14 DIAGNOSIS — R5382 Chronic fatigue, unspecified: Secondary | ICD-10-CM

## 2018-04-14 DIAGNOSIS — J449 Chronic obstructive pulmonary disease, unspecified: Secondary | ICD-10-CM | POA: Insufficient documentation

## 2018-04-14 DIAGNOSIS — R197 Diarrhea, unspecified: Secondary | ICD-10-CM | POA: Insufficient documentation

## 2018-04-14 DIAGNOSIS — Z5112 Encounter for antineoplastic immunotherapy: Secondary | ICD-10-CM | POA: Insufficient documentation

## 2018-04-14 DIAGNOSIS — R112 Nausea with vomiting, unspecified: Secondary | ICD-10-CM | POA: Diagnosis not present

## 2018-04-14 DIAGNOSIS — Z79899 Other long term (current) drug therapy: Secondary | ICD-10-CM | POA: Diagnosis not present

## 2018-04-14 DIAGNOSIS — Z5111 Encounter for antineoplastic chemotherapy: Secondary | ICD-10-CM | POA: Diagnosis not present

## 2018-04-14 DIAGNOSIS — C3411 Malignant neoplasm of upper lobe, right bronchus or lung: Secondary | ICD-10-CM | POA: Diagnosis not present

## 2018-04-14 DIAGNOSIS — N183 Chronic kidney disease, stage 3 (moderate): Secondary | ICD-10-CM | POA: Diagnosis not present

## 2018-04-14 DIAGNOSIS — C3491 Malignant neoplasm of unspecified part of right bronchus or lung: Secondary | ICD-10-CM

## 2018-04-14 DIAGNOSIS — Z5189 Encounter for other specified aftercare: Secondary | ICD-10-CM | POA: Insufficient documentation

## 2018-04-14 LAB — CMP (CANCER CENTER ONLY)
ALT: 25 U/L (ref 0–44)
ANION GAP: 11 (ref 5–15)
AST: 20 U/L (ref 15–41)
Albumin: 3.3 g/dL — ABNORMAL LOW (ref 3.5–5.0)
Alkaline Phosphatase: 94 U/L (ref 38–126)
BUN: 29 mg/dL — ABNORMAL HIGH (ref 8–23)
CO2: 27 mmol/L (ref 22–32)
Calcium: 9.8 mg/dL (ref 8.9–10.3)
Chloride: 99 mmol/L (ref 98–111)
Creatinine: 1.52 mg/dL — ABNORMAL HIGH (ref 0.61–1.24)
GFR, Est AFR Am: 48 mL/min — ABNORMAL LOW (ref >60)
GFR, Estimated: 41 mL/min — ABNORMAL LOW (ref >60)
Glucose, Bld: 212 mg/dL — ABNORMAL HIGH (ref 70–99)
Potassium: 3.9 mmol/L (ref 3.5–5.1)
Sodium: 137 mmol/L (ref 135–145)
Total Bilirubin: 0.8 mg/dL (ref 0.3–1.2)
Total Protein: 7.5 g/dL (ref 6.5–8.1)

## 2018-04-14 LAB — CBC WITH DIFFERENTIAL (CANCER CENTER ONLY)
Abs Immature Granulocytes: 0.03 10*3/uL (ref 0.00–0.07)
BASOS PCT: 0 %
Basophils Absolute: 0 10*3/uL (ref 0.0–0.1)
Eosinophils Absolute: 0 10*3/uL (ref 0.0–0.5)
Eosinophils Relative: 0 %
HCT: 40.5 % (ref 39.0–52.0)
Hemoglobin: 12.9 g/dL — ABNORMAL LOW (ref 13.0–17.0)
Immature Granulocytes: 0 %
Lymphocytes Relative: 9 %
Lymphs Abs: 0.8 10*3/uL (ref 0.7–4.0)
MCH: 31.2 pg (ref 26.0–34.0)
MCHC: 31.9 g/dL (ref 30.0–36.0)
MCV: 98.1 fL (ref 80.0–100.0)
Monocytes Absolute: 0.7 10*3/uL (ref 0.1–1.0)
Monocytes Relative: 8 %
NEUTROS PCT: 83 %
Neutro Abs: 7.3 10*3/uL (ref 1.7–7.7)
PLATELETS: 177 10*3/uL (ref 150–400)
RBC: 4.13 MIL/uL — AB (ref 4.22–5.81)
RDW: 13.2 % (ref 11.5–15.5)
WBC Count: 8.9 10*3/uL (ref 4.0–10.5)
nRBC: 0 % (ref 0.0–0.2)

## 2018-04-14 LAB — TSH: TSH: 2.156 u[IU]/mL (ref 0.320–4.118)

## 2018-04-15 ENCOUNTER — Other Ambulatory Visit: Payer: Medicare Other

## 2018-04-15 ENCOUNTER — Inpatient Hospital Stay: Payer: Medicare Other

## 2018-04-15 VITALS — BP 137/61 | HR 80 | Temp 97.9°F | Resp 18

## 2018-04-15 DIAGNOSIS — Z5189 Encounter for other specified aftercare: Secondary | ICD-10-CM | POA: Diagnosis not present

## 2018-04-15 DIAGNOSIS — C3491 Malignant neoplasm of unspecified part of right bronchus or lung: Secondary | ICD-10-CM

## 2018-04-15 DIAGNOSIS — Z79899 Other long term (current) drug therapy: Secondary | ICD-10-CM | POA: Diagnosis not present

## 2018-04-15 DIAGNOSIS — C3411 Malignant neoplasm of upper lobe, right bronchus or lung: Secondary | ICD-10-CM | POA: Diagnosis not present

## 2018-04-15 DIAGNOSIS — Z5111 Encounter for antineoplastic chemotherapy: Secondary | ICD-10-CM | POA: Diagnosis not present

## 2018-04-15 DIAGNOSIS — Z5112 Encounter for antineoplastic immunotherapy: Secondary | ICD-10-CM | POA: Diagnosis not present

## 2018-04-15 DIAGNOSIS — C7951 Secondary malignant neoplasm of bone: Secondary | ICD-10-CM | POA: Diagnosis not present

## 2018-04-15 MED ORDER — SODIUM CHLORIDE 0.9 % IV SOLN
Freq: Once | INTRAVENOUS | Status: AC
  Administered 2018-04-15: 10:00:00 via INTRAVENOUS
  Filled 2018-04-15: qty 5

## 2018-04-15 MED ORDER — SODIUM CHLORIDE 0.9 % IV SOLN
200.0000 mg | Freq: Once | INTRAVENOUS | Status: AC
  Administered 2018-04-15: 200 mg via INTRAVENOUS
  Filled 2018-04-15: qty 8

## 2018-04-15 MED ORDER — SODIUM CHLORIDE 0.9 % IV SOLN
Freq: Once | INTRAVENOUS | Status: AC
  Administered 2018-04-15: 09:00:00 via INTRAVENOUS
  Filled 2018-04-15: qty 250

## 2018-04-15 MED ORDER — SODIUM CHLORIDE 0.9 % IV SOLN
175.0000 mg/m2 | Freq: Once | INTRAVENOUS | Status: AC
  Administered 2018-04-15: 306 mg via INTRAVENOUS
  Filled 2018-04-15: qty 51

## 2018-04-15 MED ORDER — SODIUM CHLORIDE 0.9 % IV SOLN
311.0000 mg | Freq: Once | INTRAVENOUS | Status: AC
  Administered 2018-04-15: 310 mg via INTRAVENOUS
  Filled 2018-04-15: qty 31

## 2018-04-15 MED ORDER — DIPHENHYDRAMINE HCL 50 MG/ML IJ SOLN
INTRAMUSCULAR | Status: AC
  Filled 2018-04-15: qty 1

## 2018-04-15 MED ORDER — PALONOSETRON HCL INJECTION 0.25 MG/5ML
INTRAVENOUS | Status: AC
  Filled 2018-04-15: qty 5

## 2018-04-15 MED ORDER — FAMOTIDINE IN NACL 20-0.9 MG/50ML-% IV SOLN
INTRAVENOUS | Status: AC
  Filled 2018-04-15: qty 50

## 2018-04-15 MED ORDER — PALONOSETRON HCL INJECTION 0.25 MG/5ML
0.2500 mg | Freq: Once | INTRAVENOUS | Status: AC
  Administered 2018-04-15: 0.25 mg via INTRAVENOUS

## 2018-04-15 MED ORDER — DIPHENHYDRAMINE HCL 50 MG/ML IJ SOLN
50.0000 mg | Freq: Once | INTRAMUSCULAR | Status: AC
  Administered 2018-04-15: 50 mg via INTRAVENOUS

## 2018-04-15 MED ORDER — FAMOTIDINE IN NACL 20-0.9 MG/50ML-% IV SOLN
20.0000 mg | Freq: Once | INTRAVENOUS | Status: AC
  Administered 2018-04-15: 20 mg via INTRAVENOUS

## 2018-04-15 NOTE — Progress Notes (Signed)
Ed ok to tx with creatinine of 1.52. No new orders

## 2018-04-15 NOTE — Patient Instructions (Addendum)
[place] [facility] Discharge Instructions for Patients Receiving Chemotherapy  Today you received the following chemotherapy agents Keytruda, Taxol and Craboplatin  To help prevent nausea and vomiting after your treatment, we encourage you to take your nausea medication as prescribed.   If you develop nausea and vomiting that is not controlled by your nausea medication, call the clinic.   BELOW ARE SYMPTOMS THAT SHOULD BE REPORTED IMMEDIATELY:  *FEVER GREATER THAN 100.5 F  *CHILLS WITH OR WITHOUT FEVER  NAUSEA AND VOMITING THAT IS NOT CONTROLLED WITH YOUR NAUSEA MEDICATION  *UNUSUAL SHORTNESS OF BREATH  *UNUSUAL BRUISING OR BLEEDING  TENDERNESS IN MOUTH AND THROAT WITH OR WITHOUT PRESENCE OF ULCERS  *URINARY PROBLEMS  *BOWEL PROBLEMS  UNUSUAL RASH Items with * indicate a potential emergency and should be followed up as soon as possible.  Feel free to call the clinic should you have any questions or concerns. The clinic phone number is [phone].  Please show the [facility] at check-in to the Emergency Department and triage nurse.  Pembrolizumab injection(Keytruda) What is this medicine? PEMBROLIZUMAB (pem broe liz ue mab) is a monoclonal antibody. It is used to treat cervical cancer, esophageal cancer, head and neck cancer, hepatocellular cancer, Hodgkin lymphoma, kidney cancer, lymphoma, melanoma, Merkel cell carcinoma, lung cancer, stomach cancer, urothelial cancer, and cancers that have a certain genetic condition. This medicine may be used for other purposes; ask your health care provider or pharmacist if you have questions. COMMON BRAND NAME(S): Keytruda What should I tell my health care provider before I take this medicine? They need to know if you have any of these conditions: -diabetes -immune system problems -inflammatory bowel disease -liver disease -lung or breathing disease -lupus -received or scheduled to receive an organ transplant or a  stem-cell transplant that uses donor stem cells -an unusual or allergic reaction to pembrolizumab, other medicines, foods, dyes, or preservatives -pregnant or trying to get pregnant -breast-feeding How should I use this medicine? This medicine is for infusion into a vein. It is given by a health care professional in a hospital or clinic setting. A special MedGuide will be given to you before each treatment. Be sure to read this information carefully each time. Talk to your pediatrician regarding the use of this medicine in children. While this drug may be prescribed for selected conditions, precautions do apply. Overdosage: If you think you have taken too much of this medicine contact a poison control center or emergency room at once. NOTE: This medicine is only for you. Do not share this medicine with others. What if I miss a dose? It is important not to miss your dose. Call your doctor or health care professional if you are unable to keep an appointment. What may interact with this medicine? Interactions have not been studied. Give your health care provider a list of all the medicines, herbs, non-prescription drugs, or dietary supplements you use. Also tell them if you smoke, drink alcohol, or use illegal drugs. Some items may interact with your medicine. This list may not describe all possible interactions. Give your health care provider a list of all the medicines, herbs, non-prescription drugs, or dietary supplements you use. Also tell them if you smoke, drink alcohol, or use illegal drugs. Some items may interact with your medicine. What should I watch for while using this medicine? Your condition will be monitored carefully while you are receiving this medicine. You may need blood work done while you are taking this medicine. Do not  become pregnant while taking this medicine or for 4 months after stopping it. Women should inform their doctor if they wish to become pregnant or think they  might be pregnant. There is a potential for serious side effects to an unborn child. Talk to your health care professional or pharmacist for more information. Do not breast-feed an infant while taking this medicine or for 4 months after the last dose. What side effects may I notice from receiving this medicine? Side effects that you should report to your doctor or health care professional as soon as possible: -allergic reactions like skin rash, itching or hives, swelling of the face, lips, or tongue -bloody or black, tarry -breathing problems -changes in vision -chest pain -chills -confusion -constipation -cough -diarrhea -dizziness or feeling faint or lightheaded -fast or irregular heartbeat -fever -flushing -hair loss -joint pain -low blood counts - this medicine may decrease the number of white blood cells, red blood cells and platelets. You may be at increased risk for infections and bleeding. -muscle pain -muscle weakness -persistent headache -redness, blistering, peeling or loosening of the skin, including inside the mouth -signs and symptoms of high blood sugar such as dizziness; dry mouth; dry skin; fruity breath; nausea; stomach pain; increased hunger or thirst; increased urination -signs and symptoms of kidney injury like trouble passing urine or change in the amount of urine -signs and symptoms of liver injury like dark urine, light-colored stools, loss of appetite, nausea, right upper belly pain, yellowing of the eyes or skin -sweating -swollen lymph nodes -weight loss Side effects that usually do not require medical attention (report to your doctor or health care professional if they continue or are bothersome): -decreased appetite -muscle pain -tiredness This list may not describe all possible side effects. Call your doctor for medical advice about side effects. You may report side effects to FDA at 1-800-FDA-1088. Where should I keep my medicine? This drug is given  in a hospital or clinic and will not be stored at home. NOTE: This sheet is a summary. It may not cover all possible information. If you have questions about this medicine, talk to your doctor, pharmacist, or health care provider.  2019 Elsevier/Gold Standard (2017-10-08 15:06:10)  Paclitaxel injection(Taxol) What is this medicine? PACLITAXEL (PAK li TAX el) is a chemotherapy drug. It targets fast dividing cells, like cancer cells, and causes these cells to die. This medicine is used to treat ovarian cancer, breast cancer, lung cancer, Kaposi's sarcoma, and other cancers. This medicine may be used for other purposes; ask your health care provider or pharmacist if you have questions. COMMON BRAND NAME(S): Onxol, Taxol What should I tell my health care provider before I take this medicine? They need to know if you have any of these conditions: -history of irregular heartbeat -liver disease -low blood counts, like low white cell, platelet, or red cell counts -lung or breathing disease, like asthma -tingling of the fingers or toes, or other nerve disorder -an unusual or allergic reaction to paclitaxel, alcohol, polyoxyethylated castor oil, other chemotherapy, other medicines, foods, dyes, or preservatives -pregnant or trying to get pregnant -breast-feeding How should I use this medicine? This drug is given as an infusion into a vein. It is administered in a hospital or clinic by a specially trained health care professional. Talk to your pediatrician regarding the use of this medicine in children. Special care may be needed. Overdosage: If you think you have taken too much of this medicine contact a poison control center or emergency  room at once. NOTE: This medicine is only for you. Do not share this medicine with others. What if I miss a dose? It is important not to miss your dose. Call your doctor or health care professional if you are unable to keep an appointment. What may interact with  this medicine? Do not take this medicine with any of the following medications: -disulfiram -metronidazole This medicine may also interact with the following medications: -antiviral medicines for hepatitis, HIV or AIDS -certain antibiotics like erythromycin and clarithromycin -certain medicines for fungal infections like ketoconazole and itraconazole -certain medicines for seizures like carbamazepine, phenobarbital, phenytoin -gemfibrozil -nefazodone -rifampin -St. John's wort This list may not describe all possible interactions. Give your health care provider a list of all the medicines, herbs, non-prescription drugs, or dietary supplements you use. Also tell them if you smoke, drink alcohol, or use illegal drugs. Some items may interact with your medicine. What should I watch for while using this medicine? Your condition will be monitored carefully while you are receiving this medicine. You will need important blood work done while you are taking this medicine. This medicine can cause serious allergic reactions. To reduce your risk you will need to take other medicine(s) before treatment with this medicine. If you experience allergic reactions like skin rash, itching or hives, swelling of the face, lips, or tongue, tell your doctor or health care professional right away. In some cases, you may be given additional medicines to help with side effects. Follow all directions for their use. This drug may make you feel generally unwell. This is not uncommon, as chemotherapy can affect healthy cells as well as cancer cells. Report any side effects. Continue your course of treatment even though you feel ill unless your doctor tells you to stop. Call your doctor or health care professional for advice if you get a fever, chills or sore throat, or other symptoms of a cold or flu. Do not treat yourself. This drug decreases your body's ability to fight infections. Try to avoid being around people who are  sick. This medicine may increase your risk to bruise or bleed. Call your doctor or health care professional if you notice any unusual bleeding. Be careful brushing and flossing your teeth or using a toothpick because you may get an infection or bleed more easily. If you have any dental work done, tell your dentist you are receiving this medicine. Avoid taking products that contain aspirin, acetaminophen, ibuprofen, naproxen, or ketoprofen unless instructed by your doctor. These medicines may hide a fever. Do not become pregnant while taking this medicine. Women should inform their doctor if they wish to become pregnant or think they might be pregnant. There is a potential for serious side effects to an unborn child. Talk to your health care professional or pharmacist for more information. Do not breast-feed an infant while taking this medicine. Men are advised not to father a child while receiving this medicine. This product may contain alcohol. Ask your pharmacist or healthcare provider if this medicine contains alcohol. Be sure to tell all healthcare providers you are taking this medicine. Certain medicines, like metronidazole and disulfiram, can cause an unpleasant reaction when taken with alcohol. The reaction includes flushing, headache, nausea, vomiting, sweating, and increased thirst. The reaction can last from 30 minutes to several hours. What side effects may I notice from receiving this medicine? Side effects that you should report to your doctor or health care professional as soon as possible: -allergic reactions like skin rash,  itching or hives, swelling of the face, lips, or tongue -breathing problems -changes in vision -fast, irregular heartbeat -high or low blood pressure -mouth sores -pain, tingling, numbness in the hands or feet -signs of decreased platelets or bleeding - bruising, pinpoint red spots on the skin, black, tarry stools, blood in the urine -signs of decreased red blood  cells - unusually weak or tired, feeling faint or lightheaded, falls -signs of infection - fever or chills, cough, sore throat, pain or difficulty passing urine -signs and symptoms of liver injury like dark yellow or brown urine; general ill feeling or flu-like symptoms; light-colored stools; loss of appetite; nausea; right upper belly pain; unusually weak or tired; yellowing of the eyes or skin -swelling of the ankles, feet, hands -unusually slow heartbeat Side effects that usually do not require medical attention (report to your doctor or health care professional if they continue or are bothersome): -diarrhea -hair loss -loss of appetite -muscle or joint pain -nausea, vomiting -pain, redness, or irritation at site where injected -tiredness This list may not describe all possible side effects. Call your doctor for medical advice about side effects. You may report side effects to FDA at 1-800-FDA-1088. Where should I keep my medicine? This drug is given in a hospital or clinic and will not be stored at home. NOTE: This sheet is a summary. It may not cover all possible information. If you have questions about this medicine, talk to your doctor, pharmacist, or health care provider.  2019 Elsevier/Gold Standard (2016-10-28 13:14:55)   Carboplatin injection What is this medicine? CARBOPLATIN (KAR boe pla tin) is a chemotherapy drug. It targets fast dividing cells, like cancer cells, and causes these cells to die. This medicine is used to treat ovarian cancer and many other cancers. This medicine may be used for other purposes; ask your health care provider or pharmacist if you have questions. COMMON BRAND NAME(S): Paraplatin What should I tell my health care provider before I take this medicine? They need to know if you have any of these conditions: -blood disorders -hearing problems -kidney disease -recent or ongoing radiation therapy -an unusual or allergic reaction to carboplatin,  cisplatin, other chemotherapy, other medicines, foods, dyes, or preservatives -pregnant or trying to get pregnant -breast-feeding How should I use this medicine? This drug is usually given as an infusion into a vein. It is administered in a hospital or clinic by a specially trained health care professional. Talk to your pediatrician regarding the use of this medicine in children. Special care may be needed. Overdosage: If you think you have taken too much of this medicine contact a poison control center or emergency room at once. NOTE: This medicine is only for you. Do not share this medicine with others. What if I miss a dose? It is important not to miss a dose. Call your doctor or health care professional if you are unable to keep an appointment. What may interact with this medicine? -medicines for seizures -medicines to increase blood counts like filgrastim, pegfilgrastim, sargramostim -some antibiotics like amikacin, gentamicin, neomycin, streptomycin, tobramycin -vaccines Talk to your doctor or health care professional before taking any of these medicines: -acetaminophen -aspirin -ibuprofen -ketoprofen -naproxen This list may not describe all possible interactions. Give your health care provider a list of all the medicines, herbs, non-prescription drugs, or dietary supplements you use. Also tell them if you smoke, drink alcohol, or use illegal drugs. Some items may interact with your medicine. What should I watch for while using this  medicine? Your condition will be monitored carefully while you are receiving this medicine. You will need important blood work done while you are taking this medicine. This drug may make you feel generally unwell. This is not uncommon, as chemotherapy can affect healthy cells as well as cancer cells. Report any side effects. Continue your course of treatment even though you feel ill unless your doctor tells you to stop. In some cases, you may be given  additional medicines to help with side effects. Follow all directions for their use. Call your doctor or health care professional for advice if you get a fever, chills or sore throat, or other symptoms of a cold or flu. Do not treat yourself. This drug decreases your body's ability to fight infections. Try to avoid being around people who are sick. This medicine may increase your risk to bruise or bleed. Call your doctor or health care professional if you notice any unusual bleeding. Be careful brushing and flossing your teeth or using a toothpick because you may get an infection or bleed more easily. If you have any dental work done, tell your dentist you are receiving this medicine. Avoid taking products that contain aspirin, acetaminophen, ibuprofen, naproxen, or ketoprofen unless instructed by your doctor. These medicines may hide a fever. Do not become pregnant while taking this medicine. Women should inform their doctor if they wish to become pregnant or think they might be pregnant. There is a potential for serious side effects to an unborn child. Talk to your health care professional or pharmacist for more information. Do not breast-feed an infant while taking this medicine. What side effects may I notice from receiving this medicine? Side effects that you should report to your doctor or health care professional as soon as possible: -allergic reactions like skin rash, itching or hives, swelling of the face, lips, or tongue -signs of infection - fever or chills, cough, sore throat, pain or difficulty passing urine -signs of decreased platelets or bleeding - bruising, pinpoint red spots on the skin, black, tarry stools, nosebleeds -signs of decreased red blood cells - unusually weak or tired, fainting spells, lightheadedness -breathing problems -changes in hearing -changes in vision -chest pain -high blood pressure -low blood counts - This drug may decrease the number of white blood cells, red  blood cells and platelets. You may be at increased risk for infections and bleeding. -nausea and vomiting -pain, swelling, redness or irritation at the injection site -pain, tingling, numbness in the hands or feet -problems with balance, talking, walking -trouble passing urine or change in the amount of urine Side effects that usually do not require medical attention (report to your doctor or health care professional if they continue or are bothersome): -hair loss -loss of appetite -metallic taste in the mouth or changes in taste This list may not describe all possible side effects. Call your doctor for medical advice about side effects. You may report side effects to FDA at 1-800-FDA-1088. Where should I keep my medicine? This drug is given in a hospital or clinic and will not be stored at home. NOTE: This sheet is a summary. It may not cover all possible information. If you have questions about this medicine, talk to your doctor, pharmacist, or health care provider.  2019 Elsevier/Gold Standard (2007-06-01 14:38:05)   Pegfilgrastim injection (Neulasta) What is this medicine? PEGFILGRASTIM (PEG fil gra stim) is a long-acting granulocyte colony-stimulating factor that stimulates the growth of neutrophils, a type of white blood cell important in the  body's fight against infection. It is used to reduce the incidence of fever and infection in patients with certain types of cancer who are receiving chemotherapy that affects the bone marrow, and to increase survival after being exposed to high doses of radiation. This medicine may be used for other purposes; ask your health care provider or pharmacist if you have questions. COMMON BRAND NAME(S): Domenic Moras, UDENYCA What should I tell my health care provider before I take this medicine? They need to know if you have any of these conditions: -kidney disease -latex allergy -ongoing radiation therapy -sickle cell disease -skin reactions  to acrylic adhesives (On-Body Injector only) -an unusual or allergic reaction to pegfilgrastim, filgrastim, other medicines, foods, dyes, or preservatives -pregnant or trying to get pregnant -breast-feeding How should I use this medicine? This medicine is for injection under the skin. If you get this medicine at home, you will be taught how to prepare and give the pre-filled syringe or how to use the On-body Injector. Refer to the patient Instructions for Use for detailed instructions. Use exactly as directed. Tell your healthcare provider immediately if you suspect that the On-body Injector may not have performed as intended or if you suspect the use of the On-body Injector resulted in a missed or partial dose. It is important that you put your used needles and syringes in a special sharps container. Do not put them in a trash can. If you do not have a sharps container, call your pharmacist or healthcare provider to get one. Talk to your pediatrician regarding the use of this medicine in children. While this drug may be prescribed for selected conditions, precautions do apply. Overdosage: If you think you have taken too much of this medicine contact a poison control center or emergency room at once. NOTE: This medicine is only for you. Do not share this medicine with others. What if I miss a dose? It is important not to miss your dose. Call your doctor or health care professional if you miss your dose. If you miss a dose due to an On-body Injector failure or leakage, a new dose should be administered as soon as possible using a single prefilled syringe for manual use. What may interact with this medicine? Interactions have not been studied. Give your health care provider a list of all the medicines, herbs, non-prescription drugs, or dietary supplements you use. Also tell them if you smoke, drink alcohol, or use illegal drugs. Some items may interact with your medicine. This list may not describe all  possible interactions. Give your health care provider a list of all the medicines, herbs, non-prescription drugs, or dietary supplements you use. Also tell them if you smoke, drink alcohol, or use illegal drugs. Some items may interact with your medicine. What should I watch for while using this medicine? You may need blood work done while you are taking this medicine. If you are going to need a MRI, CT scan, or other procedure, tell your doctor that you are using this medicine (On-Body Injector only). What side effects may I notice from receiving this medicine? Side effects that you should report to your doctor or health care professional as soon as possible: -allergic reactions like skin rash, itching or hives, swelling of the face, lips, or tongue -back pain -dizziness -fever -pain, redness, or irritation at site where injected -pinpoint red spots on the skin -red or dark-brown urine -shortness of breath or breathing problems -stomach or side pain, or pain at the shoulder -  swelling -tiredness -trouble passing urine or change in the amount of urine Side effects that usually do not require medical attention (report to your doctor or health care professional if they continue or are bothersome): -bone pain -muscle pain This list may not describe all possible side effects. Call your doctor for medical advice about side effects. You may report side effects to FDA at 1-800-FDA-1088. Where should I keep my medicine? Keep out of the reach of children. If you are using this medicine at home, you will be instructed on how to store it. Throw away any unused medicine after the expiration date on the label. NOTE: This sheet is a summary. It may not cover all possible information. If you have questions about this medicine, talk to your doctor, pharmacist, or health care provider.  2019 Elsevier/Gold Standard (2017-06-01 16:57:08)

## 2018-04-16 DIAGNOSIS — E1122 Type 2 diabetes mellitus with diabetic chronic kidney disease: Secondary | ICD-10-CM | POA: Diagnosis not present

## 2018-04-16 DIAGNOSIS — I2510 Atherosclerotic heart disease of native coronary artery without angina pectoris: Secondary | ICD-10-CM | POA: Diagnosis not present

## 2018-04-16 DIAGNOSIS — N183 Chronic kidney disease, stage 3 (moderate): Secondary | ICD-10-CM | POA: Diagnosis not present

## 2018-04-16 DIAGNOSIS — C3400 Malignant neoplasm of unspecified main bronchus: Secondary | ICD-10-CM | POA: Diagnosis not present

## 2018-04-16 DIAGNOSIS — C7951 Secondary malignant neoplasm of bone: Secondary | ICD-10-CM | POA: Diagnosis not present

## 2018-04-16 DIAGNOSIS — I129 Hypertensive chronic kidney disease with stage 1 through stage 4 chronic kidney disease, or unspecified chronic kidney disease: Secondary | ICD-10-CM | POA: Diagnosis not present

## 2018-04-17 ENCOUNTER — Inpatient Hospital Stay: Payer: Medicare Other

## 2018-04-17 VITALS — BP 166/72 | HR 79 | Temp 97.8°F | Resp 18

## 2018-04-17 DIAGNOSIS — C7951 Secondary malignant neoplasm of bone: Secondary | ICD-10-CM | POA: Diagnosis not present

## 2018-04-17 DIAGNOSIS — Z5111 Encounter for antineoplastic chemotherapy: Secondary | ICD-10-CM | POA: Diagnosis not present

## 2018-04-17 DIAGNOSIS — Z79899 Other long term (current) drug therapy: Secondary | ICD-10-CM | POA: Diagnosis not present

## 2018-04-17 DIAGNOSIS — C3411 Malignant neoplasm of upper lobe, right bronchus or lung: Secondary | ICD-10-CM | POA: Diagnosis not present

## 2018-04-17 DIAGNOSIS — Z5189 Encounter for other specified aftercare: Secondary | ICD-10-CM | POA: Diagnosis not present

## 2018-04-17 DIAGNOSIS — Z5112 Encounter for antineoplastic immunotherapy: Secondary | ICD-10-CM | POA: Diagnosis not present

## 2018-04-17 MED ORDER — PEGFILGRASTIM-CBQV 6 MG/0.6ML ~~LOC~~ SOSY
PREFILLED_SYRINGE | SUBCUTANEOUS | Status: AC
  Filled 2018-04-17: qty 0.6

## 2018-04-17 MED ORDER — PEGFILGRASTIM-CBQV 6 MG/0.6ML ~~LOC~~ SOSY
6.0000 mg | PREFILLED_SYRINGE | Freq: Once | SUBCUTANEOUS | Status: AC
  Administered 2018-04-17: 6 mg via SUBCUTANEOUS

## 2018-04-17 NOTE — Patient Instructions (Signed)
Pegfilgrastim injection  What is this medicine?  PEGFILGRASTIM (PEG fil gra stim) is a long-acting granulocyte colony-stimulating factor that stimulates the growth of neutrophils, a type of white blood cell important in the body's fight against infection. It is used to reduce the incidence of fever and infection in patients with certain types of cancer who are receiving chemotherapy that affects the bone marrow, and to increase survival after being exposed to high doses of radiation.  This medicine may be used for other purposes; ask your health care provider or pharmacist if you have questions.  COMMON BRAND NAME(S): Fulphila, Neulasta, UDENYCA  What should I tell my health care provider before I take this medicine?  They need to know if you have any of these conditions:  -kidney disease  -latex allergy  -ongoing radiation therapy  -sickle cell disease  -skin reactions to acrylic adhesives (On-Body Injector only)  -an unusual or allergic reaction to pegfilgrastim, filgrastim, other medicines, foods, dyes, or preservatives  -pregnant or trying to get pregnant  -breast-feeding  How should I use this medicine?  This medicine is for injection under the skin. If you get this medicine at home, you will be taught how to prepare and give the pre-filled syringe or how to use the On-body Injector. Refer to the patient Instructions for Use for detailed instructions. Use exactly as directed. Tell your healthcare provider immediately if you suspect that the On-body Injector may not have performed as intended or if you suspect the use of the On-body Injector resulted in a missed or partial dose.  It is important that you put your used needles and syringes in a special sharps container. Do not put them in a trash can. If you do not have a sharps container, call your pharmacist or healthcare provider to get one.  Talk to your pediatrician regarding the use of this medicine in children. While this drug may be prescribed for  selected conditions, precautions do apply.  Overdosage: If you think you have taken too much of this medicine contact a poison control center or emergency room at once.  NOTE: This medicine is only for you. Do not share this medicine with others.  What if I miss a dose?  It is important not to miss your dose. Call your doctor or health care professional if you miss your dose. If you miss a dose due to an On-body Injector failure or leakage, a new dose should be administered as soon as possible using a single prefilled syringe for manual use.  What may interact with this medicine?  Interactions have not been studied.  Give your health care provider a list of all the medicines, herbs, non-prescription drugs, or dietary supplements you use. Also tell them if you smoke, drink alcohol, or use illegal drugs. Some items may interact with your medicine.  This list may not describe all possible interactions. Give your health care provider a list of all the medicines, herbs, non-prescription drugs, or dietary supplements you use. Also tell them if you smoke, drink alcohol, or use illegal drugs. Some items may interact with your medicine.  What should I watch for while using this medicine?  You may need blood work done while you are taking this medicine.  If you are going to need a MRI, CT scan, or other procedure, tell your doctor that you are using this medicine (On-Body Injector only).  What side effects may I notice from receiving this medicine?  Side effects that you should report to   your doctor or health care professional as soon as possible:  -allergic reactions like skin rash, itching or hives, swelling of the face, lips, or tongue  -back pain  -dizziness  -fever  -pain, redness, or irritation at site where injected  -pinpoint red spots on the skin  -red or dark-brown urine  -shortness of breath or breathing problems  -stomach or side pain, or pain at the shoulder  -swelling  -tiredness  -trouble passing urine or  change in the amount of urine  Side effects that usually do not require medical attention (report to your doctor or health care professional if they continue or are bothersome):  -bone pain  -muscle pain  This list may not describe all possible side effects. Call your doctor for medical advice about side effects. You may report side effects to FDA at 1-800-FDA-1088.  Where should I keep my medicine?  Keep out of the reach of children.  If you are using this medicine at home, you will be instructed on how to store it. Throw away any unused medicine after the expiration date on the label.  NOTE: This sheet is a summary. It may not cover all possible information. If you have questions about this medicine, talk to your doctor, pharmacist, or health care provider.   2019 Elsevier/Gold Standard (2017-06-01 16:57:08)

## 2018-04-19 ENCOUNTER — Telehealth: Payer: Self-pay | Admitting: *Deleted

## 2018-04-19 NOTE — Telephone Encounter (Signed)
TC from patient. He states he had a neulasta injection on Saturday. He had his chemotherapy Nat Math and Taxol) on 04/15/18.  He states he has body aches and diarrhea plus a sore throat.  Advised that neulasta could cause body aches but that his symptoms  Can also be caused by his treatm,ent on the 6th.  He states he had 2 loose stools on Sunday and only one today. He has not taken anything for this.  He is taking the claritin while on neulasta.  Reviewed things he can do to feel better. OK for Tylenol/ibuprofen for sore throat and body aches. Denies fever.  Encouraged to increase fluid intake-bland diet for now.  He does have chicken soup/crackers/applesauce etc at home.  He will have his sister get him the imodium for diarrhea. Instructed him on the use of the imodium.  Also advised to call back here if symptoms do not subside or they get worse, develops fever, chills, nausea, vomiting. Pt voiced understanding.

## 2018-04-20 ENCOUNTER — Other Ambulatory Visit: Payer: Self-pay

## 2018-04-20 ENCOUNTER — Inpatient Hospital Stay (HOSPITAL_COMMUNITY)
Admission: EM | Admit: 2018-04-20 | Discharge: 2018-04-24 | DRG: 394 | Disposition: A | Discharge status: Home Health | Payer: Medicare Other | Attending: Internal Medicine | Admitting: Internal Medicine

## 2018-04-20 ENCOUNTER — Encounter (HOSPITAL_COMMUNITY): Payer: Self-pay | Admitting: *Deleted

## 2018-04-20 DIAGNOSIS — Z923 Personal history of irradiation: Secondary | ICD-10-CM

## 2018-04-20 DIAGNOSIS — I10 Essential (primary) hypertension: Secondary | ICD-10-CM | POA: Diagnosis present

## 2018-04-20 DIAGNOSIS — E860 Dehydration: Secondary | ICD-10-CM

## 2018-04-20 DIAGNOSIS — C3490 Malignant neoplasm of unspecified part of unspecified bronchus or lung: Secondary | ICD-10-CM | POA: Diagnosis present

## 2018-04-20 DIAGNOSIS — N183 Chronic kidney disease, stage 3 unspecified: Secondary | ICD-10-CM | POA: Diagnosis present

## 2018-04-20 DIAGNOSIS — C3411 Malignant neoplasm of upper lobe, right bronchus or lung: Secondary | ICD-10-CM | POA: Diagnosis not present

## 2018-04-20 DIAGNOSIS — E1151 Type 2 diabetes mellitus with diabetic peripheral angiopathy without gangrene: Secondary | ICD-10-CM | POA: Diagnosis present

## 2018-04-20 DIAGNOSIS — Z87891 Personal history of nicotine dependence: Secondary | ICD-10-CM

## 2018-04-20 DIAGNOSIS — D701 Agranulocytosis secondary to cancer chemotherapy: Secondary | ICD-10-CM | POA: Diagnosis not present

## 2018-04-20 DIAGNOSIS — F419 Anxiety disorder, unspecified: Secondary | ICD-10-CM | POA: Diagnosis present

## 2018-04-20 DIAGNOSIS — K521 Toxic gastroenteritis and colitis: Principal | ICD-10-CM | POA: Diagnosis present

## 2018-04-20 DIAGNOSIS — D709 Neutropenia, unspecified: Secondary | ICD-10-CM | POA: Diagnosis not present

## 2018-04-20 DIAGNOSIS — R197 Diarrhea, unspecified: Secondary | ICD-10-CM | POA: Diagnosis not present

## 2018-04-20 DIAGNOSIS — E119 Type 2 diabetes mellitus without complications: Secondary | ICD-10-CM

## 2018-04-20 DIAGNOSIS — R0902 Hypoxemia: Secondary | ICD-10-CM | POA: Diagnosis not present

## 2018-04-20 DIAGNOSIS — J449 Chronic obstructive pulmonary disease, unspecified: Secondary | ICD-10-CM | POA: Diagnosis present

## 2018-04-20 DIAGNOSIS — I2510 Atherosclerotic heart disease of native coronary artery without angina pectoris: Secondary | ICD-10-CM | POA: Diagnosis present

## 2018-04-20 DIAGNOSIS — E1122 Type 2 diabetes mellitus with diabetic chronic kidney disease: Secondary | ICD-10-CM | POA: Diagnosis present

## 2018-04-20 DIAGNOSIS — Z7902 Long term (current) use of antithrombotics/antiplatelets: Secondary | ICD-10-CM

## 2018-04-20 DIAGNOSIS — Z9221 Personal history of antineoplastic chemotherapy: Secondary | ICD-10-CM

## 2018-04-20 DIAGNOSIS — D696 Thrombocytopenia, unspecified: Secondary | ICD-10-CM | POA: Diagnosis present

## 2018-04-20 DIAGNOSIS — J441 Chronic obstructive pulmonary disease with (acute) exacerbation: Secondary | ICD-10-CM | POA: Diagnosis present

## 2018-04-20 DIAGNOSIS — Z8249 Family history of ischemic heart disease and other diseases of the circulatory system: Secondary | ICD-10-CM

## 2018-04-20 DIAGNOSIS — T451X5A Adverse effect of antineoplastic and immunosuppressive drugs, initial encounter: Secondary | ICD-10-CM

## 2018-04-20 DIAGNOSIS — Z880 Allergy status to penicillin: Secondary | ICD-10-CM

## 2018-04-20 DIAGNOSIS — K219 Gastro-esophageal reflux disease without esophagitis: Secondary | ICD-10-CM | POA: Diagnosis present

## 2018-04-20 DIAGNOSIS — E785 Hyperlipidemia, unspecified: Secondary | ICD-10-CM | POA: Diagnosis present

## 2018-04-20 DIAGNOSIS — Z800 Family history of malignant neoplasm of digestive organs: Secondary | ICD-10-CM

## 2018-04-20 DIAGNOSIS — I129 Hypertensive chronic kidney disease with stage 1 through stage 4 chronic kidney disease, or unspecified chronic kidney disease: Secondary | ICD-10-CM | POA: Diagnosis present

## 2018-04-20 DIAGNOSIS — Z8673 Personal history of transient ischemic attack (TIA), and cerebral infarction without residual deficits: Secondary | ICD-10-CM

## 2018-04-20 DIAGNOSIS — Z66 Do not resuscitate: Secondary | ICD-10-CM | POA: Diagnosis not present

## 2018-04-20 DIAGNOSIS — D6481 Anemia due to antineoplastic chemotherapy: Secondary | ICD-10-CM | POA: Diagnosis present

## 2018-04-20 DIAGNOSIS — C7951 Secondary malignant neoplasm of bone: Secondary | ICD-10-CM | POA: Diagnosis not present

## 2018-04-20 DIAGNOSIS — K591 Functional diarrhea: Secondary | ICD-10-CM | POA: Diagnosis not present

## 2018-04-20 DIAGNOSIS — I252 Old myocardial infarction: Secondary | ICD-10-CM

## 2018-04-20 DIAGNOSIS — Z79899 Other long term (current) drug therapy: Secondary | ICD-10-CM

## 2018-04-20 LAB — COMPREHENSIVE METABOLIC PANEL
ALT: 21 U/L (ref 0–44)
AST: 18 U/L (ref 15–41)
Albumin: 3.2 g/dL — ABNORMAL LOW (ref 3.5–5.0)
Alkaline Phosphatase: 60 U/L (ref 38–126)
Anion gap: 9 (ref 5–15)
BUN: 37 mg/dL — ABNORMAL HIGH (ref 8–23)
CO2: 23 mmol/L (ref 22–32)
Calcium: 7.9 mg/dL — ABNORMAL LOW (ref 8.9–10.3)
Chloride: 102 mmol/L (ref 98–111)
Creatinine, Ser: 1.23 mg/dL (ref 0.61–1.24)
GFR calc Af Amer: >60 mL/min (ref >60)
GFR calc non Af Amer: 53 mL/min — ABNORMAL LOW (ref >60)
Glucose, Bld: 187 mg/dL — ABNORMAL HIGH (ref 70–99)
Potassium: 3.6 mmol/L (ref 3.5–5.1)
Sodium: 134 mmol/L — ABNORMAL LOW (ref 135–145)
Total Bilirubin: 1 mg/dL (ref 0.3–1.2)
Total Protein: 6.3 g/dL — ABNORMAL LOW (ref 6.5–8.1)

## 2018-04-20 LAB — CBC WITH DIFFERENTIAL/PLATELET
Abs Immature Granulocytes: 0 10*3/uL (ref 0.00–0.07)
Basophils Absolute: 0 10*3/uL (ref 0.0–0.1)
Basophils Relative: 0 %
Eosinophils Absolute: 0.2 10*3/uL (ref 0.0–0.5)
Eosinophils Relative: 36 %
HCT: 38.7 % — ABNORMAL LOW (ref 39.0–52.0)
Hemoglobin: 12.2 g/dL — ABNORMAL LOW (ref 13.0–17.0)
Immature Granulocytes: 0 %
Lymphocytes Relative: 31 %
Lymphs Abs: 0.2 10*3/uL — ABNORMAL LOW (ref 0.7–4.0)
MCH: 31.4 pg (ref 26.0–34.0)
MCHC: 31.5 g/dL (ref 30.0–36.0)
MCV: 99.7 fL (ref 80.0–100.0)
Monocytes Absolute: 0 10*3/uL — ABNORMAL LOW (ref 0.1–1.0)
Monocytes Relative: 6 %
Neutro Abs: 0.1 10*3/uL — ABNORMAL LOW (ref 1.7–7.7)
Neutrophils Relative %: 27 %
PLATELETS: 89 10*3/uL — AB (ref 150–400)
RBC: 3.88 MIL/uL — ABNORMAL LOW (ref 4.22–5.81)
RDW: 13 % (ref 11.5–15.5)
WBC: 0.5 10*3/uL — CL (ref 4.0–10.5)
nRBC: 0 % (ref 0.0–0.2)

## 2018-04-20 MED ORDER — FAMOTIDINE 20 MG PO TABS
20.0000 mg | ORAL_TABLET | Freq: Every day | ORAL | Status: DC
  Administered 2018-04-20 – 2018-04-23 (×4): 20 mg via ORAL
  Filled 2018-04-20 (×4): qty 1

## 2018-04-20 MED ORDER — LOPERAMIDE HCL 2 MG PO CAPS
4.0000 mg | ORAL_CAPSULE | ORAL | Status: DC | PRN
  Administered 2018-04-20: 4 mg via ORAL
  Filled 2018-04-20: qty 2

## 2018-04-20 MED ORDER — ENOXAPARIN SODIUM 40 MG/0.4ML ~~LOC~~ SOLN: 40.0000 mg | SUBCUTANEOUS | Status: DC

## 2018-04-20 MED ORDER — SODIUM CHLORIDE 0.9 % IV SOLN
INTRAVENOUS | Status: DC
  Administered 2018-04-20 – 2018-04-23 (×7): via INTRAVENOUS

## 2018-04-20 MED ORDER — SODIUM CHLORIDE 0.9 % IV BOLUS
1000.0000 mL | Freq: Once | INTRAVENOUS | Status: AC
  Administered 2018-04-20: 1000 mL via INTRAVENOUS

## 2018-04-20 MED ORDER — ONDANSETRON HCL 4 MG/2ML IJ SOLN: 4.0000 mg | Freq: Four times a day (QID) | INTRAMUSCULAR | Status: DC | PRN

## 2018-04-20 MED ORDER — METOPROLOL SUCCINATE ER 25 MG PO TB24
25.0000 mg | ORAL_TABLET | Freq: Every day | ORAL | Status: DC
  Administered 2018-04-21 – 2018-04-23 (×3): 25 mg via ORAL
  Filled 2018-04-20 (×3): qty 1

## 2018-04-20 MED ORDER — PREDNISONE 20 MG PO TABS
60.0000 mg | ORAL_TABLET | Freq: Every day | ORAL | Status: DC
  Administered 2018-04-21 – 2018-04-24 (×4): 60 mg via ORAL
  Filled 2018-04-20 (×4): qty 3

## 2018-04-20 MED ORDER — ACETAMINOPHEN 325 MG PO TABS
650.0000 mg | ORAL_TABLET | Freq: Four times a day (QID) | ORAL | Status: DC | PRN
  Administered 2018-04-23 (×2): 650 mg via ORAL
  Filled 2018-04-20 (×2): qty 2

## 2018-04-20 MED ORDER — HYDROCODONE-ACETAMINOPHEN 5-325 MG PO TABS: 1.0000 | ORAL_TABLET | Freq: Four times a day (QID) | ORAL | Status: DC | PRN

## 2018-04-20 MED ORDER — BOOST / RESOURCE BREEZE PO LIQD CUSTOM
1.0000 | Freq: Three times a day (TID) | ORAL | Status: DC
  Administered 2018-04-20 – 2018-04-23 (×6): 1 via ORAL

## 2018-04-20 MED ORDER — CLOPIDOGREL BISULFATE 75 MG PO TABS
75.0000 mg | ORAL_TABLET | Freq: Every day | ORAL | Status: DC
  Administered 2018-04-21 – 2018-04-24 (×4): 75 mg via ORAL
  Filled 2018-04-20 (×4): qty 1

## 2018-04-20 MED ORDER — MOMETASONE FURO-FORMOTEROL FUM 200-5 MCG/ACT IN AERO
2.0000 | INHALATION_SPRAY | Freq: Two times a day (BID) | RESPIRATORY_TRACT | Status: DC
  Administered 2018-04-21 – 2018-04-24 (×7): 2 via RESPIRATORY_TRACT
  Filled 2018-04-20: qty 8.8

## 2018-04-20 MED ORDER — ONDANSETRON HCL 4 MG PO TABS
4.0000 mg | ORAL_TABLET | Freq: Four times a day (QID) | ORAL | Status: DC | PRN
  Administered 2018-04-24: 4 mg via ORAL
  Filled 2018-04-20: qty 1

## 2018-04-20 MED ORDER — MORPHINE SULFATE (PF) 2 MG/ML IV SOLN: 2.0000 mg | INTRAVENOUS | Status: DC | PRN

## 2018-04-20 MED ORDER — TRAMADOL HCL 50 MG PO TABS: 50.0000 mg | ORAL_TABLET | Freq: Three times a day (TID) | ORAL | Status: DC | PRN

## 2018-04-20 MED ORDER — ACETAMINOPHEN 650 MG RE SUPP: 650.0000 mg | Freq: Four times a day (QID) | RECTAL | Status: DC | PRN

## 2018-04-20 MED ORDER — ATORVASTATIN CALCIUM 20 MG PO TABS
20.0000 mg | ORAL_TABLET | Freq: Every day | ORAL | Status: DC
  Administered 2018-04-20 – 2018-04-23 (×4): 20 mg via ORAL
  Filled 2018-04-20: qty 1
  Filled 2018-04-20 (×2): qty 2
  Filled 2018-04-20 (×2): qty 1
  Filled 2018-04-20: qty 2
  Filled 2018-04-20: qty 1
  Filled 2018-04-20: qty 2
  Filled 2018-04-20: qty 1

## 2018-04-20 MED ORDER — ALPRAZOLAM 0.5 MG PO TABS
0.5000 mg | ORAL_TABLET | Freq: Every day | ORAL | Status: DC
  Administered 2018-04-20 – 2018-04-23 (×4): 0.5 mg via ORAL
  Filled 2018-04-20 (×4): qty 1

## 2018-04-20 MED ORDER — PREDNISONE 20 MG PO TABS
60.0000 mg | ORAL_TABLET | Freq: Once | ORAL | Status: AC
  Administered 2018-04-20: 60 mg via ORAL
  Filled 2018-04-20: qty 3

## 2018-04-20 NOTE — ED Notes (Signed)
Pt provided water, encouraged to drink

## 2018-04-20 NOTE — ED Provider Notes (Signed)
[facility] DEPT Provider Note   CSN: [id number] Arrival date & time: 04/20/18  1040     History   Chief Complaint No chief complaint on file.   HPI Martin [name] is a 85 y.o. male.  HPI   He is here for evaluation of diarrhea.  He had a chemotherapy treatment 04/15/2018, and Neulasta treatment on 04/17/2018.  He is being treated for lung cancer.  He feels generally weak.  He states he does not feel hungry.  He has not seen any blood in the stool.  He thinks that he has had about 10 loose bowel movements in the last 24 hours.  He denies focal weakness or paresthesia.  He denies fever, chills, cough or chest pain.  There are no other known modifying factors.  Past Medical History:  Diagnosis Date  . Arthritis   . CKD (chronic kidney disease) stage 3, GFR 30-59 ml/min (HCC)   . COPD (chronic obstructive pulmonary disease) ([place])   . Diabetes mellitus    denies, reports resolved with diet  . GERD (gastroesophageal reflux disease)   . H/O: GI bleed   . Hiatal hernia   . Hyperlipidemia   . Hypertension   . Myocardial infarction [facility])    1978 & 2011  . Peripheral vascular disease, unspecified ([place])   . Renal artery stenosis ([place])   . Stroke [facility])     Patient Active Problem List   Diagnosis Date Noted  . Goals of care, counseling/discussion 04/06/2018  . Encounter for antineoplastic chemotherapy 04/06/2018  . Encounter for antineoplastic immunotherapy 04/06/2018  . Bone metastasis ([place]) 03/18/2018  . Metastatic lung cancer (metastasis from lung to other site) ([place]) 03/16/2018  . Insomnia 10/05/2015  . Late effect of stroke 10/05/2015  . Slow transit constipation   . Nausea without vomiting   . Essential hypertension   . Anxiety state   . Cerebellar infarct ([place]) 07/17/2015  . Cerebellar stroke ([place])   . Coronary artery disease involving native coronary artery of native heart without angina pectoris   . Chronic obstructive pulmonary disease  ([place])   . CKD (chronic kidney disease) stage 3, GFR 30-59 ml/min (HCC)   . Leukocytosis   . Thrombocytopenia ([place])   . Cerebrovascular accident (CVA) ([place])   . Stroke ([place]) 07/15/2015  . CVA (cerebral infarction) 07/15/2015  . Hypothermia 07/15/2015  . Atherosclerotic PVD with intermittent claudication ([place]) 12/29/2013  . Aftercare following surgery of the circulatory system, [place] 12/23/2012  . Atherosclerosis of native arteries of the extremities with intermittent claudication 06/17/2012  . Diabetes mellitus, type 2 ([place]) 06/11/2012  . Peripheral vascular disease, unspecified ([place]) 05/29/2011  . HLD (hyperlipidemia) 08/14/2009  . MYOCARDIAL INFARCTION 08/14/2009  . ATRIAL FIBRILLATION 08/14/2009  . COPD 08/14/2009  . TOBACCO ABUSE, HX OF 08/14/2009    Past Surgical History:  Procedure Laterality Date  . Aortobifemoral BPG  07/22/10   and Right femoral-popliteal BPG        Home Medications    Prior to Admission medications   Medication Sig Start Date End Date Taking? Authorizing Provider  ALPRAZolam Duanne Moron) 0.5 MG tablet TAKE 1 TABLET BY MOUTH AT BEDTIME 04/05/18  Yes Susy Frizzle, MD  atorvastatin (LIPITOR) 20 MG tablet TAKE 1 TABLET BY MOUTH ONCE DAILY AT  6PM. Patient taking differently: Take 20 mg by mouth daily at 6 PM.  03/27/18  Yes Pickard, Cammie Mcgee, MD  budesonide-formoterol (SYMBICORT) 160-4.5 MCG/ACT inhaler Inhale 2 puffs into the lungs 2 (two)  times daily as needed (for COPD flares).   Yes [provider]  clopidogrel (PLAVIX) 75 MG tablet Take 1 tablet (75 mg total) by mouth daily. 05/01/17  Yes Susy Frizzle, MD  famotidine (PEPCID) 20 MG tablet TAKE 1 TABLET BY MOUTH TWICE DAILY Patient taking differently: Take 20 mg by mouth at bedtime.  07/27/17  Yes Susy Frizzle, MD  HYDROcodone-acetaminophen (NORCO/VICODIN) 5-325 MG tablet Take 1 tablet by mouth every 6 (six) hours as needed for moderate pain. 04/07/18  Yes Curt Bears, MD  ibuprofen  (ADVIL,MOTRIN) 200 MG tablet Take 200 mg by mouth every 6 (six) hours as needed (for pain).   Yes [provider]  metoprolol succinate (TOPROL-XL) 25 MG 24 hr tablet TAKE 1 TABLET BY MOUTH ONCE DAILY 04/12/18  Yes Susy Frizzle, MD  polyethylene glycol (MIRALAX / GLYCOLAX) packet Take 17 g by mouth daily. 03/19/18  Yes Mariel Aloe, MD  senna (SENOKOT) 8.6 MG TABS tablet Take 1 tablet (8.6 mg total) by mouth daily. 03/19/18  Yes Mariel Aloe, MD  traMADol (ULTRAM) 50 MG tablet Take 1 tablet (50 mg total) by mouth every 8 (eight) hours as needed. 03/30/18  Yes Pickard, Cammie Mcgee, MD  NITROSTAT 0.4 MG SL tablet DISSOLVE ONE TABLET UNDER THE TONGUE AS DIRECTED Patient taking differently: Place 0.4 mg under the tongue every 5 (five) minutes as needed for chest pain.  08/09/15   Susy Frizzle, MD  prochlorperazine (COMPAZINE) 10 MG tablet Take 1 tablet (10 mg total) by mouth every 6 (six) hours as needed for nausea or vomiting. Patient not taking: Reported on 04/20/2018 04/06/18   Curt Bears, MD    Family History Family History  Problem Relation Age of Onset  . Cancer Father 32       LIVER CANCER  . Dementia Brother 31  . CAD Brother     Social History Social History   Tobacco Use  . Smoking status: Former Smoker    Packs/day: 1.00    Years: 65.00    Pack years: 65.00    Types: Cigarettes    Last attempt to quit: 07/08/2009    Years since quitting: 8.7  . Smokeless tobacco: Never Used  Substance Use Topics  . Alcohol use: No  . Drug use: No     Allergies   Penicillins   Review of Systems Review of Systems  All other systems reviewed and are negative.    Physical Exam Updated Vital Signs BP 122/60 (BP Location: Left Arm)   Pulse 95   Temp 99.4 F (37.4 C) (Oral)   Resp 17   Ht 5\' 6"  (1.676 m)   Wt 65.1 kg   SpO2 98%   BMI 23.16 kg/m   Physical Exam Vitals signs and nursing note reviewed.  Constitutional:      General: He is not in acute  distress.    Appearance: Normal appearance. He is well-developed. He is not ill-appearing, toxic-appearing or diaphoretic.  HENT:     Head: Normocephalic and atraumatic.     Right Ear: External ear normal.     Left Ear: External ear normal.     Nose: Nose normal.     Mouth/Throat:     Mouth: Mucous membranes are moist.     Pharynx: No oropharyngeal exudate or posterior oropharyngeal erythema.  Eyes:     Conjunctiva/sclera: Conjunctivae normal.     Pupils: Pupils are equal, round, and reactive to light.  Neck:  Musculoskeletal: Normal range of motion and neck supple.     Trachea: Phonation normal.  Cardiovascular:     Rate and Rhythm: Normal rate and regular rhythm.     Heart sounds: Normal heart sounds.  Pulmonary:     Effort: Pulmonary effort is normal.     Breath sounds: Normal breath sounds.  Abdominal:     Palpations: Abdomen is soft.     Tenderness: There is no abdominal tenderness.  Musculoskeletal: Normal range of motion.  Skin:    General: Skin is warm and dry.  Neurological:     Mental Status: He is alert and oriented to person, place, and time.     Cranial Nerves: No cranial nerve deficit.     Sensory: No sensory deficit.     Motor: No abnormal muscle tone.     Coordination: Coordination normal.  Psychiatric:        Mood and Affect: Mood normal.        Behavior: Behavior normal.        Thought Content: Thought content normal.        Judgment: Judgment normal.      ED Treatments / Results  Labs (all labs ordered are listed, but only abnormal results are displayed) Labs Reviewed  CBC WITH DIFFERENTIAL/PLATELET - Abnormal; Notable for the following components:      Result Value   WBC 0.5 (*)    RBC 3.88 (*)    Hemoglobin 12.2 (*)    HCT 38.7 (*)    Platelets 89 (*)    Neutro Abs 0.1 (*)    Lymphs Abs 0.2 (*)    Monocytes Absolute 0.0 (*)    All other components within normal limits  COMPREHENSIVE METABOLIC PANEL - Abnormal; Notable for the following  components:   Sodium 134 (*)    Glucose, Bld 187 (*)    BUN 37 (*)    Calcium 7.9 (*)    Total Protein 6.3 (*)    Albumin 3.2 (*)    GFR calc non Af Amer 53 (*)    All other components within normal limits    EKG None  Radiology No results found.  Procedures Procedures (including critical care time)  Medications Ordered in ED Medications  loperamide (IMODIUM) capsule 4 mg (4 mg Oral Given 04/20/18 1215)  0.9 %  sodium chloride infusion (has no administration in time range)  sodium chloride 0.9 % bolus 1,000 mL (1,000 mLs Intravenous New Bag/Given 04/20/18 1215)     Initial Impression / Assessment and Plan / ED Course  I have reviewed the triage vital signs and the nursing notes.  Pertinent labs & imaging results that were available during my care of the patient were reviewed by me and considered in my medical decision making (see chart for details).  Clinical Course as of Apr 20 1401  Tue Apr 20, 2018  1357 Case discussed with oncology, Dr. Julien Nordmann.  He states that since the patient is having frequent diarrhea, and this is a known complication of his chemotherapy, he will require prednisone 1 mg/kg, daily, to improve his symptoms.  He states that the patient will require hospitalization for management.   [EW]  1358 Normal except white count low, hemoglobin low, platelets low  CBC with Differential(!!) [EW]  1358 Normal except sodium low, glucose high, BUN high, calcium low, total protein low, albumin low, GFR low  Comprehensive metabolic panel(!) [EW]  1027 Trending of CBC indicates that white count and platelets are significantly lower  than prior.   [EW]    Clinical Course User Index [EW] Daleen Bo, MD     Patient Vitals for the past 24 hrs:  BP Temp Temp src Pulse Resp SpO2 Height Weight  04/20/18 1328 122/60 99.4 F (37.4 C) Oral 95 17 98 % - -  04/20/18 1234 131/63 - - 96 19 98 % - -  04/20/18 1051 127/72 99 F (37.2 C) Oral - - - 5\' 6"  (1.676 m) 65.1 kg    04/20/18 1046 - - - - - 94 % - -    1:59 PM Reevaluation with update and discussion. After initial assessment and treatment, an updated evaluation reveals patient is currently resting comfortably.  Patient's sister updated on findings and plan. Daleen Bo   Medical Decision Making: Diarrhea, likely secondary to chemotherapy, with neutropenia and thrombocytopenia.  Patient requires treatment with high-dose prednisone, and will need to be monitored in the hospital setting.  Suspect some element of dehydration with elevated BUN from baseline.  CRITICAL CARE-yes Performed by: Daleen Bo  Nursing Notes Reviewed/ Care Coordinated Applicable Imaging Reviewed Interpretation of Laboratory Data incorporated into ED treatment   2:00 PM-Consult complete with hospitalist. Patient case explained and discussed.  She agrees to admit patient for further evaluation and treatment. Call ended at [place]  Final Clinical Impressions(s) / ED Diagnoses   Final diagnoses:  Diarrhea, unspecified type  Dehydration  Neutropenia, unspecified type ([place])  Thrombocytopenia [facility])    ED Discharge Orders    None       Daleen Bo, MD 04/20/18 1537

## 2018-04-20 NOTE — ED Notes (Signed)
RN notified of abnormal lab 

## 2018-04-20 NOTE — Care Management (Signed)
ED CM reviewed CM consult. Patient is being admitted. Unit CM to follow for discharge needs. Venita Sheffield RN CCM

## 2018-04-20 NOTE — ED Triage Notes (Addendum)
Pt BIBA from home c/o diarrhea and fatigue x3 days. Received 1st dose of chemo on Saturday, woke up Sunday feeling ill.    Denies n/v.  AOx4, ambulatory with assistance, uses walker at home.   Hx of lung cancer.

## 2018-04-20 NOTE — ED Notes (Signed)
Pt provided chicken noodle soup and saltine crackers, per his request.

## 2018-04-20 NOTE — ED Notes (Signed)
Transport notified for need to transport pt to inpatient bed.

## 2018-04-20 NOTE — ED Notes (Signed)
Bed: WA17 Expected date: 04/20/18 Expected time:  Means of arrival:  Comments: EMS

## 2018-04-20 NOTE — ED Notes (Signed)
ED TO INPATIENT HANDOFF REPORT  Name/Age/Gender Martin [name] 85 y.o. male  Code Status    Code Status Orders  (From admission, onward)         Start     Ordered   04/20/18 1654  Do not attempt resuscitation (DNR)  Continuous    Question Answer Comment  In the event of cardiac or respiratory ARREST Do not call a "code blue"   In the event of cardiac or respiratory ARREST Do not perform Intubation, CPR, defibrillation or ACLS   In the event of cardiac or respiratory ARREST Use medication by any route, position, wound care, and other measures to relive pain and suffering. May use oxygen, suction and manual treatment of airway obstruction as needed for comfort.      04/20/18 1654        Code Status History    Date Active Date Inactive Code Status Order ID Comments User Context   03/16/2018 1207 03/19/2018 1856 Full Code [id number]  Karmen Bongo, MD ED   07/17/2015 1812 07/26/2015 1436 Full Code [id number]  Cathlyn Parsons, PA-C Inpatient   07/17/2015 1812 07/17/2015 1812 Full Code [id number]  Cathlyn Parsons, PA-C Inpatient   07/15/2015 1709 07/17/2015 1812 Full Code [id number]  Willia Craze, NP Inpatient    Advance Directive Documentation     Most Recent Value  Type of Advance Directive  Living will, Healthcare Power of Attorney  Pre-existing out of facility DNR order (yellow form or pink MOST form)  -  "MOST" Form in Place?  -      Home/SNF/Other Home  Chief Complaint Diarrhea; Fatigue  Level of Care/Admitting Diagnosis ED Disposition    ED Disposition Condition Comment   Admit  Hospital Area: [facility] [[id number]]  Level of Care: Med-Surg [16]  Diagnosis: Diarrhea [787.91.ICD-9-CM]  Admitting Physician: Karmen Bongo [[id number]]  Attending Physician: Karmen Bongo [[id number]]  PT Class (Do Not Modify): Observation [104]  PT Acc Code (Do Not Modify): Observation [10022]       Medical History Past Medical History:  Diagnosis Date  . Arthritis    . CKD (chronic kidney disease) stage 3, GFR 30-59 ml/min (HCC)   . COPD (chronic obstructive pulmonary disease) ([place])   . Diabetes mellitus    denies, reports resolved with diet  . GERD (gastroesophageal reflux disease)   . H/O: GI bleed   . Hiatal hernia   . Hyperlipidemia   . Hypertension   . Myocardial infarction [facility])    1978 & 2011  . Peripheral vascular disease, unspecified ([place])   . Renal artery stenosis ([place])   . Stroke [facility])     Allergies Allergies  Allergen Reactions  . Penicillins Rash    DID THE REACTION INVOLVE: Swelling of the face/tongue/throat, SOB, or low BP? Yes Sudden or severe rash/hives, skin peeling, or the inside of the mouth or nose? No Did it require medical treatment? Was already in hosp When did it last happen?40 years ago If all above answers are "NO", may proceed with cephalosporin use.     IV Location/Drains/Wounds Patient Lines/Drains/Airways Status   Active Line/Drains/Airways    Name:   Placement date:   Placement time:   Site:   Days:   Peripheral IV 04/20/18 Right Antecubital   04/20/18    1214    Antecubital   less than 1          Labs/Imaging Results for orders placed or performed during the hospital encounter of 04/20/18 (  from the past 48 hour(s))  CBC with Differential     Status: Abnormal   Collection Time: 04/20/18 12:25 PM  Result Value Ref Range   WBC 0.5 (LL) 4.0 - 10.5 K/uL    Comment: This critical result has verified and been called to WEST, S. RN by Raelyn Ensign on 02 11 2020 at 1253, and has been read back. Critical Result Verified   RBC 3.88 (L) 4.22 - 5.81 MIL/uL   Hemoglobin 12.2 (L) 13.0 - 17.0 g/dL   HCT 38.7 (L) 39.0 - 52.0 %   MCV 99.7 80.0 - 100.0 fL   MCH 31.4 26.0 - 34.0 pg   MCHC 31.5 30.0 - 36.0 g/dL   RDW 13.0 11.5 - 15.5 %   Platelets 89 (L) 150 - 400 K/uL    Comment: REPEATED TO VERIFY PLATELET COUNT CONFIRMED BY SMEAR SPECIMEN CHECKED FOR CLOTS Immature Platelet Fraction may be clinically  indicated, consider ordering this additional test [id number]    nRBC 0.0 0.0 - 0.2 %   Neutrophils Relative % 27 %   Neutro Abs 0.1 (L) 1.7 - 7.7 K/uL   Lymphocytes Relative 31 %   Lymphs Abs 0.2 (L) 0.7 - 4.0 K/uL   Monocytes Relative 6 %   Monocytes Absolute 0.0 (L) 0.1 - 1.0 K/uL   Eosinophils Relative 36 %   Eosinophils Absolute 0.2 0.0 - 0.5 K/uL   Basophils Relative 0 %   Basophils Absolute 0.0 0.0 - 0.1 K/uL   WBC Morphology MORPHOLOGY UNREMARKABLE    Immature Granulocytes 0 %   Abs Immature Granulocytes 0.00 0.00 - 0.07 K/uL    Comment: Performed at [facility], [place] [address] [place], [place] [address]  Comprehensive metabolic panel     Status: Abnormal   Collection Time: 04/20/18 12:25 PM  Result Value Ref Range   Sodium 134 (L) 135 - 145 mmol/L   Potassium 3.6 3.5 - 5.1 mmol/L   Chloride 102 98 - 111 mmol/L   CO2 23 22 - 32 mmol/L   Glucose, Bld 187 (H) 70 - 99 mg/dL   BUN 37 (H) 8 - 23 mg/dL   Creatinine, Ser 1.23 0.61 - 1.24 mg/dL   Calcium 7.9 (L) 8.9 - 10.3 mg/dL   Total Protein 6.3 (L) 6.5 - 8.1 g/dL   Albumin 3.2 (L) 3.5 - 5.0 g/dL   AST 18 15 - 41 U/L   ALT 21 0 - 44 U/L   Alkaline Phosphatase 60 38 - 126 U/L   Total Bilirubin 1.0 0.3 - 1.2 mg/dL   GFR calc non Af Amer 53 (L) >60 mL/min   GFR calc Af Amer >60 >60 mL/min   Anion gap 9 5 - 15    Comment: Performed at [facility], [place] [address], [place], [place] [address]   No results found.  Pending Labs Unresulted Labs (From admission, onward)    Start     Ordered   04/21/18 8250  Basic metabolic panel  Tomorrow morning,   R     04/20/18 1654   04/21/18 0500  CBC  Tomorrow morning,   R     04/20/18 1654          Vitals/Pain Today's Vitals   04/20/18 1500 04/20/18 1530 04/20/18 1534 04/20/18 1630  BP: (!) 113/57 (!) 104/54 (!) 104/54 (!) 133/59  Pulse: (!) 101 (!) 101 99   Resp: (!) 27 17 17 13   Temp:      TempSrc:  SpO2: 97% 94% 95%   Weight:       Height:      PainSc:        Isolation Precautions No active isolations  Medications Medications  loperamide (IMODIUM) capsule 4 mg (4 mg Oral Given 04/20/18 1215)  0.9 %  sodium chloride infusion ( Intravenous New Bag/Given 04/20/18 1641)  HYDROcodone-acetaminophen (NORCO/VICODIN) 5-325 MG per tablet 1 tablet (has no administration in time range)  traMADol (ULTRAM) tablet 50 mg (has no administration in time range)  atorvastatin (LIPITOR) tablet 20 mg (has no administration in time range)  metoprolol succinate (TOPROL-XL) 24 hr tablet 25 mg (has no administration in time range)  ALPRAZolam (XANAX) tablet 0.5 mg (has no administration in time range)  famotidine (PEPCID) tablet 20 mg (has no administration in time range)  clopidogrel (PLAVIX) tablet 75 mg (has no administration in time range)  mometasone-formoterol (DULERA) 200-5 MCG/ACT inhaler 2 puff (has no administration in time range)  acetaminophen (TYLENOL) tablet 650 mg (has no administration in time range)    Or  acetaminophen (TYLENOL) suppository 650 mg (has no administration in time range)  ondansetron (ZOFRAN) tablet 4 mg (has no administration in time range)    Or  ondansetron (ZOFRAN) injection 4 mg (has no administration in time range)  morphine 2 MG/ML injection 2 mg (has no administration in time range)  predniSONE (DELTASONE) tablet 60 mg (has no administration in time range)  sodium chloride 0.9 % bolus 1,000 mL (0 mLs Intravenous Stopped 04/20/18 1406)  predniSONE (DELTASONE) tablet 60 mg (60 mg Oral Given 04/20/18 1607)    Mobility walks with device

## 2018-04-20 NOTE — H&P (Signed)
History and Physical    Martin [name] OJJ:[id number] DOB: 1930/09/02 DOA: 04/20/2018  PCP: Susy Frizzle, MD Consultants:  [facility] - vascular; [facility] - cardiology; [facility] - oncology Patient coming from:  Home - lives alone; NOK: [place], [phone]; Sister, [phone]  Chief Complaint: Diarrhea  HPI: Martin [name] is a 85 y.o. male with medical history significant of CVA; PVD with renal artery steonsis; CAD; HRN; HLD; GERD; DM; and COPD presenting with diarrhea. He hasn't been able to eat, sleep, walk and was having nausea, vomiting, and diarrhea.  He took his shot Saturday at [facility] and he felt fine that evening.  His symptoms started on Sunday AM.  He has had 1 radiation treatment and has 2 more.  His back pain is gone after his radiation.  He was admitted from 1/6-9 for metastatic lung cancer with back pain.  ED Course:  Profuse diarrhea, likely related to oncology medication.  20 non-bloody stools x 24 hours.  Prednisone should improve.  Likely needs IVF.  May need placement.  Review of Systems: As per HPI; otherwise review of systems reviewed and negative.   Ambulatory Status:  Ambulates without assistance, but has been using a cane this weekend  Past Medical History:  Diagnosis Date  . Arthritis   . CKD (chronic kidney disease) stage 3, GFR 30-59 ml/min (HCC)   . COPD (chronic obstructive pulmonary disease) ([place])   . Diabetes mellitus    denies, reports resolved with diet  . GERD (gastroesophageal reflux disease)   . H/O: GI bleed   . Hiatal hernia   . Hyperlipidemia   . Hypertension   . Myocardial infarction [facility])    1978 & 2011  . Peripheral vascular disease, unspecified ([place])   . Renal artery stenosis ([place])   . Stroke [facility])     Past Surgical History:  Procedure Laterality Date  . Aortobifemoral BPG  07/22/10   and Right femoral-popliteal BPG    Social History   Socioeconomic History  . Marital status: Widowed    Spouse name: Not on file  . Number of  children: Not on file  . Years of education: Not on file  . Highest education level: Not on file  Occupational History  . Occupation: retired  Scientific laboratory technician  . Financial resource strain: Not on file  . Food insecurity:    Worry: Not on file    Inability: Not on file  . Transportation needs:    Medical: Not on file    Non-medical: Not on file  Tobacco Use  . Smoking status: Former Smoker    Packs/day: 1.00    Years: 65.00    Pack years: 65.00    Types: Cigarettes    Last attempt to quit: 07/08/2009    Years since quitting: 8.7  . Smokeless tobacco: Never Used  Substance and Sexual Activity  . Alcohol use: No  . Drug use: No  . Sexual activity: Not on file  Lifestyle  . Physical activity:    Days per week: Not on file    Minutes per session: Not on file  . Stress: Not on file  Relationships  . Social connections:    Talks on phone: Not on file    Gets together: Not on file    Attends religious service: Not on file    Active member of club or organization: Not on file    Attends meetings of clubs or organizations: Not on file    Relationship status: Not on file  .  Intimate partner violence:    Fear of current or ex partner: Not on file    Emotionally abused: Not on file    Physically abused: Not on file    Forced sexual activity: Not on file  Other Topics Concern  . Not on file  Social History Narrative  . Not on file    Allergies  Allergen Reactions  . Penicillins Rash    DID THE REACTION INVOLVE: Swelling of the face/tongue/throat, SOB, or low BP? Yes Sudden or severe rash/hives, skin peeling, or the inside of the mouth or nose? No Did it require medical treatment? Was already in hosp When did it last happen?40 years ago If all above answers are "NO", may proceed with cephalosporin use.     Family History  Problem Relation Age of Onset  . Cancer Father 33       LIVER CANCER  . Dementia Brother 62  . CAD Brother     Prior to Admission medications     Medication Sig Start Date End Date Taking? Authorizing Provider  ALPRAZolam Duanne Moron) 0.5 MG tablet TAKE 1 TABLET BY MOUTH AT BEDTIME 04/05/18  Yes Susy Frizzle, MD  atorvastatin (LIPITOR) 20 MG tablet TAKE 1 TABLET BY MOUTH ONCE DAILY AT  6PM. Patient taking differently: Take 20 mg by mouth daily at 6 PM.  03/27/18  Yes Pickard, Cammie Mcgee, MD  budesonide-formoterol (SYMBICORT) 160-4.5 MCG/ACT inhaler Inhale 2 puffs into the lungs 2 (two) times daily as needed (for COPD flares).   Yes [provider]  clopidogrel (PLAVIX) 75 MG tablet Take 1 tablet (75 mg total) by mouth daily. 05/01/17  Yes Susy Frizzle, MD  famotidine (PEPCID) 20 MG tablet TAKE 1 TABLET BY MOUTH TWICE DAILY Patient taking differently: Take 20 mg by mouth at bedtime.  07/27/17  Yes Susy Frizzle, MD  HYDROcodone-acetaminophen (NORCO/VICODIN) 5-325 MG tablet Take 1 tablet by mouth every 6 (six) hours as needed for moderate pain. 04/07/18  Yes Curt Bears, MD  ibuprofen (ADVIL,MOTRIN) 200 MG tablet Take 200 mg by mouth every 6 (six) hours as needed (for pain).   Yes [provider]  metoprolol succinate (TOPROL-XL) 25 MG 24 hr tablet TAKE 1 TABLET BY MOUTH ONCE DAILY 04/12/18  Yes Susy Frizzle, MD  polyethylene glycol (MIRALAX / GLYCOLAX) packet Take 17 g by mouth daily. 03/19/18  Yes Mariel Aloe, MD  senna (SENOKOT) 8.6 MG TABS tablet Take 1 tablet (8.6 mg total) by mouth daily. 03/19/18  Yes Mariel Aloe, MD  traMADol (ULTRAM) 50 MG tablet Take 1 tablet (50 mg total) by mouth every 8 (eight) hours as needed. 03/30/18  Yes Pickard, Cammie Mcgee, MD  NITROSTAT 0.4 MG SL tablet DISSOLVE ONE TABLET UNDER THE TONGUE AS DIRECTED Patient taking differently: Place 0.4 mg under the tongue every 5 (five) minutes as needed for chest pain.  08/09/15   Susy Frizzle, MD  prochlorperazine (COMPAZINE) 10 MG tablet Take 1 tablet (10 mg total) by mouth every 6 (six) hours as needed for nausea or vomiting. Patient  not taking: Reported on 04/20/2018 04/06/18   Curt Bears, MD    Physical Exam: Vitals:   04/20/18 1051 04/20/18 1234 04/20/18 1328 04/20/18 1534  BP: 127/72 131/63 122/60 (!) 104/54  Pulse:  96 95 99  Resp:  19 17 17   Temp: 99 F (37.2 C)  99.4 F (37.4 C)   TempSrc: Oral  Oral   SpO2:  98% 98% 95%  Weight:  65.1 kg     Height: 5\' 6"  (1.676 m)        . General:  Appears calm and comfortable and is NAD; he appears more frail than when I admitted him a month ago . Eyes:  PERRL, EOMI, normal lids, iris . ENT:  grossly normal hearing, lips & tongue, mmm; small ulceration on lower lip but no other apparent ulcerations of mouth . Neck:  no LAD, masses or thyromegaly . Cardiovascular:  RRR, no m/r/g. No LE edema.  Marland Kitchen Respiratory:   CTA bilaterally with no wheezes/rales/rhonchi.  Normal respiratory effort. . Abdomen:  soft, NT other than mild LLQ TTP, ND, NABS . Skin:  no rash or induration seen on limited exam . Musculoskeletal:  grossly normal tone BUE/BLE, good ROM, no bony abnormality . Psychiatric:  grossly normal mood and affect, speech fluent and appropriate, AOx3 . Neurologic:  CN 2-12 grossly intact, moves all extremities in coordinated fashion, sensation intact    Radiological Exams on Admission: No results found.  EKG: not done   Labs on Admission: I have personally reviewed the available labs and imaging studies at the time of the admission.  Pertinent labs:   Glucose 187 BUN 37/Creatinine 1.23/GFR 53 - stable/improved from prior WBC 0.5; 8.9 on 2/11 Hgb 12.2 - stable Platelets 89 - 177 on 2/5   Assessment/Plan Principal Problem:   Diarrhea Active Problems:   Diabetes mellitus, type 2 (HCC)   Chronic obstructive pulmonary disease (HCC)   CKD (chronic kidney disease) stage 3, GFR 30-59 ml/min (HCC)   Thrombocytopenia (HCC)   Essential hypertension   Metastatic lung cancer (metastasis from lung to other site) ([place])   Neutropenia (HCC)      Diarrhea -Patient undergoing active treatment for metastatic lung cancer -Developed onset of GI symptoms after his last treatment -Per discussion with oncology, patient should be hospitalized for hydration and high-dose prednisone -Will add Dr. Julien Nordmann to the treatment team -He does not appear to be markedly dehydrated at this time, so will observe for now -IVF for mild dehydration, likely able to transition to PO if he tolerates this overnight -Clear liquids and advance diet as tolerated  Metastatic lung CA -Patient recently admitted with right-sided sacrum pain and progressive inability to walk over the last 6 weeks -CT of LS spine shows a probable metastatic destructive lesion at basically the same place he is having pain -Further evaluation included a negative CXR but then a chest CT with a partially cavitary irregular spiculated 4.3 x 1.6 cm apical RUL mass which is suspicious for primary bronchogenic carcinoma as well as a lytic destructive expansile 5.0 x 3.1 cm lateral left fourth rib mass which is apparently asymptomatic -He was subsequently diagnosed with stage IV NSCLC  -He received palliative radiotherapy to the right sacral metastatic bone lesion and his pain has resolved He is receiving active chemo with carboplatin, paclitaxel, and Keytruda  Neutropenia, thrombocytopenia -He is receiving Neulasta support but is currently Neutropenic -Precautions started to protect patient -No Lovenox given thrombocytopenia; will place SCDs for now but may need to d/c if he is having purpura/bruising  HTN -Continue Toprol XL  HLD -Continue Lipitor  DM -Recent A1c 6.7 -Will follow with fasting AM labs -It is unlikely that he will need acute or chronic treatment for this issue at this time  COPD -He uses Symbicort prn, which is generally not particularly efficacious -Will give Symbicort BID instead   Stage 3 CKD -Appears to be stable at this time -Recheck  BMP in  AM  Anxiety -Continue Xanax  DVT prophylaxis:  SCDs  Code Status:  DNR - confirmed with patient Family Communication: None present Disposition Plan:  Home once clinically improved - but there are some family concerns about his ability to live independently so will request CM and PT consults Consults called: Oncology; CM, PT  Admission status:  It is my clinical opinion that referral for OBSERVATION is reasonable and necessary in this patient based on the above information provided. The aforementioned taken together are felt to place the patient at high risk for further clinical deterioration. However it is anticipated that the patient may be medically stable for discharge from the hospital within 24 to 48 hours.   Karmen Bongo MD [facility]   How to contact the [facility] Attending or Consulting provider [place] or covering provider during after hours [place], for this patient?  1. Check the care team in [facility] and look for a) attending/consulting TR[facility] provider listed and b) the [facility] team listed 2. Log into [email or web address] and use [place]'s universal password to access. If you do not have the password, please contact the hospital operator. 3. Locate the [facility] provider you are looking for under [facility] and page to a number that you can be directly reached. 4. If you still have difficulty reaching the provider, please page the [facility] (Director on Call) for the Hospitalists listed on amion for assistance.   04/20/2018, 4:59 PM

## 2018-04-21 DIAGNOSIS — I10 Essential (primary) hypertension: Secondary | ICD-10-CM | POA: Diagnosis not present

## 2018-04-21 DIAGNOSIS — E860 Dehydration: Secondary | ICD-10-CM | POA: Diagnosis present

## 2018-04-21 DIAGNOSIS — F411 Generalized anxiety disorder: Secondary | ICD-10-CM | POA: Diagnosis not present

## 2018-04-21 DIAGNOSIS — Z66 Do not resuscitate: Secondary | ICD-10-CM | POA: Diagnosis present

## 2018-04-21 DIAGNOSIS — A09 Infectious gastroenteritis and colitis, unspecified: Secondary | ICD-10-CM | POA: Diagnosis not present

## 2018-04-21 DIAGNOSIS — I2510 Atherosclerotic heart disease of native coronary artery without angina pectoris: Secondary | ICD-10-CM | POA: Diagnosis present

## 2018-04-21 DIAGNOSIS — K219 Gastro-esophageal reflux disease without esophagitis: Secondary | ICD-10-CM | POA: Diagnosis present

## 2018-04-21 DIAGNOSIS — I739 Peripheral vascular disease, unspecified: Secondary | ICD-10-CM | POA: Diagnosis not present

## 2018-04-21 DIAGNOSIS — I4891 Unspecified atrial fibrillation: Secondary | ICD-10-CM | POA: Diagnosis not present

## 2018-04-21 DIAGNOSIS — T451X5A Adverse effect of antineoplastic and immunosuppressive drugs, initial encounter: Secondary | ICD-10-CM | POA: Diagnosis not present

## 2018-04-21 DIAGNOSIS — R197 Diarrhea, unspecified: Secondary | ICD-10-CM | POA: Diagnosis not present

## 2018-04-21 DIAGNOSIS — K591 Functional diarrhea: Secondary | ICD-10-CM | POA: Diagnosis not present

## 2018-04-21 DIAGNOSIS — I70219 Atherosclerosis of native arteries of extremities with intermittent claudication, unspecified extremity: Secondary | ICD-10-CM | POA: Diagnosis not present

## 2018-04-21 DIAGNOSIS — I219 Acute myocardial infarction, unspecified: Secondary | ICD-10-CM | POA: Diagnosis not present

## 2018-04-21 DIAGNOSIS — M6281 Muscle weakness (generalized): Secondary | ICD-10-CM | POA: Diagnosis not present

## 2018-04-21 DIAGNOSIS — N183 Chronic kidney disease, stage 3 (moderate): Secondary | ICD-10-CM

## 2018-04-21 DIAGNOSIS — Z800 Family history of malignant neoplasm of digestive organs: Secondary | ICD-10-CM | POA: Diagnosis not present

## 2018-04-21 DIAGNOSIS — F419 Anxiety disorder, unspecified: Secondary | ICD-10-CM | POA: Diagnosis present

## 2018-04-21 DIAGNOSIS — Z743 Need for continuous supervision: Secondary | ICD-10-CM | POA: Diagnosis not present

## 2018-04-21 DIAGNOSIS — E1122 Type 2 diabetes mellitus with diabetic chronic kidney disease: Secondary | ICD-10-CM | POA: Diagnosis present

## 2018-04-21 DIAGNOSIS — R279 Unspecified lack of coordination: Secondary | ICD-10-CM | POA: Diagnosis not present

## 2018-04-21 DIAGNOSIS — K521 Toxic gastroenteritis and colitis: Secondary | ICD-10-CM | POA: Diagnosis present

## 2018-04-21 DIAGNOSIS — Z7902 Long term (current) use of antithrombotics/antiplatelets: Secondary | ICD-10-CM | POA: Diagnosis not present

## 2018-04-21 DIAGNOSIS — I6930 Unspecified sequelae of cerebral infarction: Secondary | ICD-10-CM | POA: Diagnosis not present

## 2018-04-21 DIAGNOSIS — Z8673 Personal history of transient ischemic attack (TIA), and cerebral infarction without residual deficits: Secondary | ICD-10-CM | POA: Diagnosis not present

## 2018-04-21 DIAGNOSIS — D696 Thrombocytopenia, unspecified: Secondary | ICD-10-CM | POA: Diagnosis present

## 2018-04-21 DIAGNOSIS — C3411 Malignant neoplasm of upper lobe, right bronchus or lung: Secondary | ICD-10-CM | POA: Diagnosis present

## 2018-04-21 DIAGNOSIS — J410 Simple chronic bronchitis: Secondary | ICD-10-CM

## 2018-04-21 DIAGNOSIS — E1151 Type 2 diabetes mellitus with diabetic peripheral angiopathy without gangrene: Secondary | ICD-10-CM | POA: Diagnosis present

## 2018-04-21 DIAGNOSIS — Z87891 Personal history of nicotine dependence: Secondary | ICD-10-CM | POA: Diagnosis not present

## 2018-04-21 DIAGNOSIS — D701 Agranulocytosis secondary to cancer chemotherapy: Secondary | ICD-10-CM | POA: Diagnosis not present

## 2018-04-21 DIAGNOSIS — Z880 Allergy status to penicillin: Secondary | ICD-10-CM | POA: Diagnosis not present

## 2018-04-21 DIAGNOSIS — C3490 Malignant neoplasm of unspecified part of unspecified bronchus or lung: Secondary | ICD-10-CM | POA: Diagnosis not present

## 2018-04-21 DIAGNOSIS — E118 Type 2 diabetes mellitus with unspecified complications: Secondary | ICD-10-CM | POA: Diagnosis not present

## 2018-04-21 DIAGNOSIS — Z8249 Family history of ischemic heart disease and other diseases of the circulatory system: Secondary | ICD-10-CM | POA: Diagnosis not present

## 2018-04-21 DIAGNOSIS — E785 Hyperlipidemia, unspecified: Secondary | ICD-10-CM | POA: Diagnosis present

## 2018-04-21 DIAGNOSIS — D709 Neutropenia, unspecified: Secondary | ICD-10-CM | POA: Diagnosis present

## 2018-04-21 DIAGNOSIS — J449 Chronic obstructive pulmonary disease, unspecified: Secondary | ICD-10-CM | POA: Diagnosis present

## 2018-04-21 DIAGNOSIS — I129 Hypertensive chronic kidney disease with stage 1 through stage 4 chronic kidney disease, or unspecified chronic kidney disease: Secondary | ICD-10-CM | POA: Diagnosis present

## 2018-04-21 DIAGNOSIS — D6481 Anemia due to antineoplastic chemotherapy: Secondary | ICD-10-CM | POA: Diagnosis present

## 2018-04-21 DIAGNOSIS — C7951 Secondary malignant neoplasm of bone: Secondary | ICD-10-CM | POA: Diagnosis present

## 2018-04-21 LAB — BASIC METABOLIC PANEL
Anion gap: 9 (ref 5–15)
BUN: 30 mg/dL — ABNORMAL HIGH (ref 8–23)
CO2: 19 mmol/L — ABNORMAL LOW (ref 22–32)
Calcium: 7 mg/dL — ABNORMAL LOW (ref 8.9–10.3)
Chloride: 107 mmol/L (ref 98–111)
Creatinine, Ser: 1.04 mg/dL (ref 0.61–1.24)
GFR calc Af Amer: >60 mL/min (ref >60)
GFR calc non Af Amer: >60 mL/min (ref >60)
Glucose, Bld: 186 mg/dL — ABNORMAL HIGH (ref 70–99)
Potassium: 3.8 mmol/L (ref 3.5–5.1)
SODIUM: 135 mmol/L (ref 135–145)

## 2018-04-21 LAB — CBC
HCT: 31.1 % — ABNORMAL LOW (ref 39.0–52.0)
Hemoglobin: 9.7 g/dL — ABNORMAL LOW (ref 13.0–17.0)
MCH: 30.9 pg (ref 26.0–34.0)
MCHC: 31.2 g/dL (ref 30.0–36.0)
MCV: 99 fL (ref 80.0–100.0)
Platelets: 63 10*3/uL — ABNORMAL LOW (ref 150–400)
RBC: 3.14 MIL/uL — ABNORMAL LOW (ref 4.22–5.81)
RDW: 12.9 % (ref 11.5–15.5)
WBC: 0.2 10*3/uL — CL (ref 4.0–10.5)
nRBC: 0 % (ref 0.0–0.2)

## 2018-04-21 NOTE — Progress Notes (Signed)
[facility]                                                                              Patient Demographics  Martin [name], is a 85 y.o. male, DOB - 1930-03-22, FHL:[id number]  Admit date - 04/20/2018   Admitting Physician Karmen Bongo, MD  Outpatient Primary MD for the patient is Pickard, Cammie Mcgee, MD  Outpatient specialists:   LOS - 0  days   Medical records reviewed and are as summarized below:    No chief complaint on file.      Brief summary   Patient is 85 year old male with history of CVA, PVD with renal artery stenosis, CAD; HRN; HLD; GERD; DM; and COPD, recently diagnosed with stage IV non-small cell lung CA status post palliative radiotherapy to right sacral metastatic bone lesion, currently undergoing systemic chemotherapy with carboplatin, paclitaxel, Keytruda status post 1 cycle, on 04/15/2018 with Neulasta support.  Patient presented with 2 days of profuse diarrhea > 10-12 times a day in addition to generalized weakness, fatigue and dehydration.    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 Julien Nordmann today, recommended high-dose prednisone for 1 week, then start the taper -Continue prednisone 60 mg daily for 1 week, then taper -Increase to full liquid diet, soft solids as tolerated in a.m. -Overnight one reading of temp 100.5 F, diarrhea, if continues to spike fevers, will place on IV cefepime and Flagyl.  Obtain GI pathogen panel.  Active Problems:   Neutropenia (HCC) -Likely chemotherapy-induced neutropenia, patient had received Neulasta with chemotherapy -Oncology following, continue to monitor WBC count until ANC > 1000 before discharge. -Continue neutropenic precautions  Anemia, thrombocytopenia -On chemotherapy, transfuse if hemoglobin less than 8, currently H&H stable    Diabetes mellitus, type 2  (HCC) -Continue sliding scale insulin, recent A1c 6.7    Chronic obstructive pulmonary disease (HCC) -Currently no wheezing, stable    CKD (chronic kidney disease) stage 3, GFR 30-59 ml/min (HCC) -Creatinine appears to be at baseline, stable    Essential hypertension -Continue Toprol-XL    Metastatic lung cancer (metastasis from lung to other site) [facility]) -Recently diagnosed in 03/2018, chest CT showed irregular spiculated 4.3x 1.6 cm apical right upper lung mass, lytic destructive expansile 5.0x 3.1 lateral left fourth rib mass, subsequently diagnosed with stage IV NSCLC -Started on chemotherapy with carboplatin, paclitaxel, Keytruda status post 1 cycle, on 04/15/2018 with Neulasta support.     Code Status: DNR DVT Prophylaxis: SCD's Family Communication: Discussed in detail with the patient, all imaging results, lab results explained to the patient    Disposition Plan: When medically ready, ANC >1000  Time Spent in minutes 35 minutes  Procedures:  None  Consultants:   Oncology  Antimicrobials:   Anti-infectives (From admission, onward)   None          Medications  Scheduled Meds: . ALPRAZolam  0.5 mg Oral QHS  . atorvastatin  20 mg Oral q1800  . clopidogrel  75 mg Oral Daily  . famotidine  20 mg  Oral QHS  . feeding supplement  1 Container Oral TID BM  . metoprolol succinate  25 mg Oral Daily  . mometasone-formoterol  2 puff Inhalation BID  . predniSONE  60 mg Oral Q breakfast   Continuous Infusions: . sodium chloride 125 mL/hr at 04/21/18 1025   PRN Meds:.acetaminophen **OR** acetaminophen, HYDROcodone-acetaminophen, loperamide, morphine injection, ondansetron **OR** ondansetron (ZOFRAN) IV, traMADol      Subjective:   Bodie Abernethy was seen and examined today.  Currently no nausea or vomiting.  Low-grade temp of 100.5 F.  Still having diarrhea.  Patient denies dizziness, chest pain, shortness of breath, new weakness, numbess, tingling.   Objective:     Vitals:   04/20/18 1811 04/20/18 1811 04/20/18 2136 04/21/18 0554  BP:  (!) 105/56 (!) 116/53 (!) 123/56  Pulse: 100 100 93 91  Resp:  18 16 16   Temp:  (!) 100.5 F (38.1 C) 99.1 F (37.3 C) 98 F (36.7 C)  TempSrc:  Oral    SpO2:  96% 98% 98%  Weight:      Height:        Intake/Output Summary (Last 24 hours) at 04/21/2018 1319 Last data filed at 04/21/2018 1244 Gross per 24 hour  Intake 3496.11 ml  Output -  Net 3496.11 ml     Wt Readings from Last 3 Encounters:  04/20/18 65.1 kg  04/06/18 65.7 kg  03/30/18 66.2 kg     Exam  General: Alert and oriented x 3, NAD  Eyes:   HEENT:   Cardiovascular: S1 S2 auscultated, Regular rate and rhythm.  Respiratory: Clear to auscultation bilaterally, no wheezing, rales or rhonchi  Gastrointestinal: Soft, mild LLQ TTP , nondistended, + bowel sounds  Ext: no pedal edema bilaterally  Neuro: No new deficits  Musculoskeletal: No digital cyanosis, clubbing  Skin: No rashes  Psych: Normal affect and demeanor, alert and oriented x3    Data Reviewed:  I have personally reviewed following labs and imaging studies  Micro Results No results found for this or any previous visit (from the past 240 hour(s)).  Radiology Reports Nm Pet Image Initial (pi) Skull Base To Thigh  Result Date: 04/01/2018 CLINICAL DATA:  Initial treatment strategy for lung cancer. EXAM: NUCLEAR MEDICINE PET SKULL BASE TO THIGH TECHNIQUE: 7.2 mCi F-18 FDG was injected intravenously. Full-ring PET imaging was performed from the skull base to thigh after the radiotracer. CT data was obtained and used for attenuation correction and anatomic localization. Fasting blood glucose: 159 mg/dl COMPARISON:  Multiple exams, including CT chest from 03/16/2018 FINDINGS: Mediastinal blood pool activity: SUV max 2.7 NECK: Expected photopenia associated with the right inferior medial cerebellar encephalomalacia. Incidental CT findings: Bilateral common carotid  atherosclerotic calcification. CHEST: Irregular right apical mass, about 4.9 by 1.2 cm, maximum SUV 6.5 along the upper pleural margin. Destructive lesion of the left fourth rib with associated pleural mass, approximately 5.1 by 2.9 cm, maximum SUV 12.8. Adjacent hypermetabolic pleural nodularity in the fourth intercostal region posteriorly, maximum SUV 6.6. Incidental CT findings: Coronary, aortic arch, and branch vessel atherosclerotic vascular disease. Trace left pleural effusion. Mild airway plugging in the left lower lobe. ABDOMEN/PELVIS: No significant abnormal hypermetabolic activity in this region. Incidental CT findings: Small gallstones are present. Aorta bi-iliac graft. Sigmoid colon diverticulosis. Moderate prostatomegaly. SKELETON: 4.8 by 3.7 cm lytic/destructive lesion of the right sacral ala, maximum SUV 6.1, impingement on the right sacral plexus not excluded. There is a small central lucency in the right fourth rib without accentuated metabolic  activity, significance uncertain. Incidental CT findings: Degenerative glenohumeral arthropathy bilaterally. IMPRESSION: 1. Right apical mass measures 4.9 by 1.2 cm and extends to the pleural surface, maximum SUV 6.5, concerning for Pancoast tumor. 2. Destructive lesion of the left fourth rib, maximum SUV 12.8, with extensive soft tissue component. There is also hypermetabolic nodularity along the left pleura posterior to this lesion, and a trace left pleural effusion. 3. 4.8 by 3.7 cm lytic lesion of the right sacral ala, maximum SUV 6.1, favoring metastatic disease. A possibility of myeloma is not completely excluded, and serologic tests for myeloma would be prudent. 4. Other imaging findings of potential clinical significance: Aortic Atherosclerosis (ICD10-I70.0). Coronary atherosclerosis. Mild airway plugging in the left lower lobe. Cholelithiasis. Aorta bi-iliac graft. Sigmoid colon diverticulosis. Moderate cardiomegaly. Degenerative glenohumeral  arthropathy bilaterally. Old right inferior cerebellar stroke. Electronically Signed   By: Van Clines M.D.   On: 04/01/2018 16:20    Lab Data:  CBC: Recent Labs  Lab 04/14/18 1439 04/20/18 1225 04/21/18 0624  WBC 8.9 0.5* 0.2*  NEUTROABS 7.3 0.1*  --   HGB 12.9* 12.2* 9.7*  HCT 40.5 38.7* 31.1*  MCV 98.1 99.7 99.0  PLT 177 89* 63*   Basic Metabolic Panel: Recent Labs  Lab 04/14/18 1439 04/20/18 1225 04/21/18 0624  NA 137 134* 135  K 3.9 3.6 3.8  CL 99 102 107  CO2 27 23 19*  GLUCOSE 212* 187* 186*  BUN 29* 37* 30*  CREATININE 1.52* 1.23 1.04  CALCIUM 9.8 7.9* 7.0*   GFR: Estimated Creatinine Clearance: 46.9 mL/min (by C-G formula based on SCr of 1.04 mg/dL). Liver Function Tests: Recent Labs  Lab 04/14/18 1439 04/20/18 1225  AST 20 18  ALT 25 21  ALKPHOS 94 60  BILITOT 0.8 1.0  PROT 7.5 6.3*  ALBUMIN 3.3* 3.2*   No results for input(s): LIPASE, AMYLASE in the last 168 hours. No results for input(s): AMMONIA in the last 168 hours. Coagulation Profile: No results for input(s): INR, PROTIME in the last 168 hours. Cardiac Enzymes: No results for input(s): CKTOTAL, CKMB, CKMBINDEX, TROPONINI in the last 168 hours. BNP (last 3 results) No results for input(s): PROBNP in the last 8760 hours. HbA1C: No results for input(s): HGBA1C in the last 72 hours. CBG: No results for input(s): GLUCAP in the last 168 hours. Lipid Profile: No results for input(s): CHOL, HDL, LDLCALC, TRIG, CHOLHDL, LDLDIRECT in the last 72 hours. Thyroid Function Tests: No results for input(s): TSH, T4TOTAL, FREET4, T3FREE, THYROIDAB in the last 72 hours. Anemia Panel: No results for input(s): VITAMINB12, FOLATE, FERRITIN, TIBC, IRON, RETICCTPCT in the last 72 hours. Urine analysis:    Component Value Date/Time   COLORURINE YELLOW 03/15/2018 2147   APPEARANCEUR CLEAR 03/15/2018 2147   LABSPEC 1.013 03/15/2018 2147   PHURINE 6.0 03/15/2018 2147   GLUCOSEU 50 (A) 03/15/2018  2147   HGBUR NEGATIVE 03/15/2018 2147   BILIRUBINUR NEGATIVE 03/15/2018 2147   [facility] NEGATIVE 03/15/2018 2147   PROTEINUR 100 (A) 03/15/2018 2147   UROBILINOGEN 0.2 07/16/2010 1429   NITRITE NEGATIVE 03/15/2018 2147   LEUKOCYTESUR NEGATIVE 03/15/2018 2147       M.D. [facility] 04/21/2018, 1:19 PM  Pager: [phone] Between 7am to 7pm - call Pager - [phone]  After 7pm go to [email or web address] - password [email or web address]  Call night coverage person covering after 7pm

## 2018-04-21 NOTE — Evaluation (Signed)
Physical Therapy Evaluation Patient Details Name: Martin [name] MRN: [id number] DOB: Feb 02, 1931 Today's Date: 04/21/2018   History of Present Illness  Patient is 85 year old male with history of CVA, PVD with renal artery stenosis, CAD; HRN; HLD; GERD; DM; and COPD, recently diagnosed with stage IV non-small cell lung CA status post palliative radiotherapy to right sacral metastatic bone lesion, currently undergoing systemic chemotherapy with carboplatin, paclitaxel, Keytruda status post 1 cycle, on 04/15/2018 with Neulasta support.  Patient presented with 2 days of profuse diarrhea > 10-12 times a day in addition to generalized weakness, fatigue and dehydration.  Clinical Impression  Patient presents with decreased balance, LE strength and decreased endurance and will benefit from skilled PT in the acute setting prior to d/c.  Currently does not want to go to SNF, but knows he may not be able to manage on his own at home.  Looking into ALF, but may take time to get set up.  Feel he would be safest at SNF due to fall risk, but pt concerned about getting in for his chemo treatments and feels he may be able to manage at home. Have discussed fall risk and recommendations.  PT to follow.     Follow Up Recommendations SNF(patient likely to refuse SNF and would need HHPT)    Equipment Recommendations  Rolling walker with 5" wheels    Recommendations for Other Services       Precautions / Restrictions Precautions Precautions: Fall      Mobility  Bed Mobility Overal bed mobility: Modified Independent             General bed mobility comments: for sit to supine  Transfers Overall transfer level: Needs assistance Equipment used: Rolling walker (2 wheeled) Transfers: Sit to/from Stand Sit to Stand: Supervision         General transfer comment: assist for safety, pulled up on walker from low chair without armrests  Ambulation/Gait Ambulation/Gait assistance: Min guard;Min  assist Gait Distance (Feet): 100 Feet Assistive device: Rolling walker (2 wheeled) Gait Pattern/deviations: Step-to pattern;Decreased stride length;Decreased stance time - right;Decreased step length - left     General Gait Details: reports R LE weakness since bypass surgery, but was not using walker previously  Science writer    Modified Rankin (Stroke Patients Only)       Balance Overall balance assessment: Needs assistance   Sitting balance-Leahy Scale: Good     Standing balance support: Bilateral upper extremity supported Standing balance-Leahy Scale: Poor Standing balance comment: almost with LOB posterior with walker when opening door in room min A to prevent LOB                             Pertinent Vitals/Pain Pain Assessment: No/denies pain    Home Living Family/patient expects to be discharged to:: Private residence Living Arrangements: Alone   Type of Home: House Home Access: Stairs to enter Entrance Stairs-Rails: Right Entrance Stairs-Number of Steps: 4 Home Layout: Two level;Able to live on main level with bedroom/bathroom Home Equipment: Gilford Rile - 2 wheels;Cane - single point;Electric scooter Additional Comments: scooter too big for house    Prior Function Level of Independence: Independent         Comments: plays golf     Hand Dominance        Extremity/Trunk Assessment        Lower Extremity Assessment Lower  Extremity Assessment: RLE deficits/detail RLE Deficits / Details: reports R LE weakness and numbness at times due to previous bypass surgery; weakness noted with ambulation utilizing walker and shorter step length on the left/decreased stance time on R       Communication   Communication: No difficulties  Cognition Arousal/Alertness: Awake/alert Behavior During Therapy: WFL for tasks assessed/performed Overall Cognitive Status: Within Functional Limits for tasks assessed                                         General Comments General comments (skin integrity, edema, etc.): Patient has researched cost for ALF and states with his brother's help he hopes to do that.    Exercises     Assessment/Plan    PT Assessment Patient needs continued PT services  PT Problem List Decreased mobility;Decreased balance;Decreased activity tolerance;Decreased strength;Decreased knowledge of use of DME;Decreased safety awareness       PT Treatment Interventions DME instruction;Therapeutic exercise;Gait training;Balance training;Functional mobility training;Therapeutic activities;Patient/family education;Stair training    PT Goals (Current goals can be found in the Care Plan section)  Acute Rehab PT Goals Patient Stated Goal: to go into assisted living PT Goal Formulation: With patient Time For Goal Achievement: 04/28/18 Potential to Achieve Goals: Fair    Frequency Min 3X/week   Barriers to discharge        Co-evaluation               AM-PAC PT "6 Clicks" Mobility  Outcome Measure Help needed turning from your back to your side while in a flat bed without using bedrails?: None Help needed moving from lying on your back to sitting on the side of a flat bed without using bedrails?: None Help needed moving to and from a bed to a chair (including a wheelchair)?: A Little Help needed standing up from a chair using your arms (e.g., wheelchair or bedside chair)?: A Little Help needed to walk in hospital room?: A Little Help needed climbing 3-5 steps with a railing? : A Little 6 Click Score: 20    End of Session   Activity Tolerance: Patient limited by fatigue Patient left: with call bell/phone within reach;in bed;with bed alarm set   PT Visit Diagnosis: Other abnormalities of gait and mobility (R26.89);Muscle weakness (generalized) (M62.81)    Time: 1914-7829 PT Time Calculation (min) (ACUTE ONLY): 26 min   Charges:   PT Evaluation $PT Eval Moderate  Complexity: 1 Mod PT Treatments $Gait Training: 8-22 mins        Magda Kiel, [place] [facility] [phone] 04/21/2018   Reginia Naas 04/21/2018, 4:19 PM

## 2018-04-21 NOTE — Progress Notes (Signed)
Subjective: The patient is seen and examined today.  He is a very pleasant 85 years old white male who was recently diagnosed with stage IV non-small cell lung cancer status post palliative radiotherapy to right sacral metastatic bone lesion and the patient is currently undergoing systemic chemotherapy with carboplatin, paclitaxel and Keytruda status post 1 cycle.  His first cycle of the chemotherapy was given on April 15, 2010 with Neulasta support.  The patient presented to the emergency department yesterday complaining of 2 days of significant diarrhea more than 12 times a day in addition to fatigue and weakness and dehydration.  He was admitted for further evaluation of his diarrhea and concern about immunotherapy mediated colitis.  He is feeling a little bit better today with only one episode of diarrhea so far. He has no fever or chills.  He has no current nausea or vomiting.   Objective: Vital signs in last 24 hours: Temp:  [98 F (36.7 C)-100.5 F (38.1 C)] 98 F (36.7 C) (02/12 0554) Pulse Rate:  [91-101] 91 (02/12 0554) Resp:  [13-27] 16 (02/12 0554) BP: (104-133)/(53-72) 123/56 (02/12 0554) SpO2:  [94 %-98 %] 98 % (02/12 0554) Weight:  [143 lb 8 oz (65.1 kg)] 143 lb 8 oz (65.1 kg) (02/11 1051)  Intake/Output from previous day: 02/11 0701 - 02/12 0700 In: 2536.1 [I.V.:1536.1; IV Piggyback:1000] Out: -  Intake/Output this shift: No intake/output data recorded.  General appearance: alert, cooperative, fatigued and no distress Resp: clear to auscultation bilaterally Cardio: regular rate and rhythm, S1, S2 normal, no murmur, click, rub or gallop GI: soft, non-tender; bowel sounds normal; no masses,  no organomegaly Extremities: extremities normal, atraumatic, no cyanosis or edema  Lab Results:  Recent Labs    04/20/18 1225 04/21/18 0624  WBC 0.5* 0.2*  HGB 12.2* 9.7*  HCT 38.7* 31.1*  PLT 89* 63*   BMET Recent Labs    04/20/18 1225 04/21/18 0624  NA 134* 135  K  3.6 3.8  CL 102 107  CO2 23 19*  GLUCOSE 187* 186*  BUN 37* 30*  CREATININE 1.23 1.04  CALCIUM 7.9* 7.0*    Studies/Results: No results found.  Medications: I have reviewed the patient's current medications.  Assessment/Plan: This is a very pleasant 85 years old white male recently diagnosed with a stage IV non-small cell lung cancer status post palliative radiotherapy to metastatic bone disease in the sacrum and the patient is currently undergoing systemic chemotherapy with carboplatin, paclitaxel in addition to immunotherapy with Keytruda status post 1 cycle.  He also received Neulasta injection after the first dose of his treatment. For the diarrhea, this is concerning for immunotherapy mediated colitis and diarrhea.  I recommended for the patient to continue on high-dose prednisone for now 1 mg/KG daily for at least 1 week and then will start taper has a steroid dose. For the chemotherapy-induced neutropenia, the patient received Neulasta injection and will continue to monitor his white blood count until his absolute neutrophil count is over 1000 before discharging the patient from the hospital. For the anemia and thrombocytopenia, this is likely secondary to recent chemotherapy we will continue to monitor for now.  There is no need for transfusion at this point unless his hemoglobin is less than 8.0G/DL. Thank you so much for taking good care of Mr. Fikes.  I will continue to follow up the patient with you and assist in his management on as-needed basis.   LOS: 0 days    Eilleen Kempf 04/21/2018

## 2018-04-22 DIAGNOSIS — K591 Functional diarrhea: Secondary | ICD-10-CM

## 2018-04-22 LAB — CBC WITH DIFFERENTIAL/PLATELET
ABS IMMATURE GRANULOCYTES: 0 10*3/uL (ref 0.00–0.07)
Basophils Absolute: 0 10*3/uL (ref 0.0–0.1)
Basophils Relative: 0 %
Eosinophils Absolute: 0.1 10*3/uL (ref 0.0–0.5)
Eosinophils Relative: 17 %
HCT: 30.6 % — ABNORMAL LOW (ref 39.0–52.0)
Hemoglobin: 9.5 g/dL — ABNORMAL LOW (ref 13.0–17.0)
IMMATURE GRANULOCYTES: 0 %
Lymphocytes Relative: 39 %
Lymphs Abs: 0.2 10*3/uL — ABNORMAL LOW (ref 0.7–4.0)
MCH: 31.6 pg (ref 26.0–34.0)
MCHC: 31 g/dL (ref 30.0–36.0)
MCV: 101.7 fL — ABNORMAL HIGH (ref 80.0–100.0)
Monocytes Absolute: 0.2 10*3/uL (ref 0.1–1.0)
Monocytes Relative: 33 %
NEUTROS ABS: 0.1 10*3/uL — AB (ref 1.7–7.7)
Neutrophils Relative %: 11 %
Platelets: 62 10*3/uL — ABNORMAL LOW (ref 150–400)
RBC: 3.01 MIL/uL — ABNORMAL LOW (ref 4.22–5.81)
RDW: 12.7 % (ref 11.5–15.5)
WBC: 0.5 10*3/uL — CL (ref 4.0–10.5)
nRBC: 0 % (ref 0.0–0.2)

## 2018-04-22 LAB — BASIC METABOLIC PANEL
Anion gap: 4 — ABNORMAL LOW (ref 5–15)
BUN: 22 mg/dL (ref 8–23)
CALCIUM: 7.3 mg/dL — AB (ref 8.9–10.3)
CO2: 20 mmol/L — ABNORMAL LOW (ref 22–32)
Chloride: 114 mmol/L — ABNORMAL HIGH (ref 98–111)
Creatinine, Ser: 0.97 mg/dL (ref 0.61–1.24)
GFR calc Af Amer: >60 mL/min (ref >60)
GFR calc non Af Amer: >60 mL/min (ref >60)
Glucose, Bld: 144 mg/dL — ABNORMAL HIGH (ref 70–99)
POTASSIUM: 4 mmol/L (ref 3.5–5.1)
Sodium: 138 mmol/L (ref 135–145)

## 2018-04-22 LAB — MAGNESIUM: Magnesium: 1.7 mg/dL (ref 1.7–2.4)

## 2018-04-22 MED ORDER — LIP MEDEX EX OINT
TOPICAL_OINTMENT | CUTANEOUS | Status: AC
  Filled 2018-04-22: qty 7

## 2018-04-22 MED ORDER — LIP MEDEX EX OINT: TOPICAL_OINTMENT | CUTANEOUS | Status: DC | PRN

## 2018-04-22 NOTE — Progress Notes (Signed)
[facility]                                                                              Patient Demographics  Martin [name], is a 85 y.o. male, DOB - November 24, 1930, RFF:[id number]  Admit date - 04/20/2018   Admitting Physician Karmen Bongo, MD  Outpatient Primary MD for the patient is Pickard, Cammie Mcgee, MD  Outpatient specialists:   LOS - 1  days   Medical records reviewed and are as summarized below:    No chief complaint on file.      Brief summary   Patient is 85 year old male with history of CVA, PVD with renal artery stenosis, CAD; HRN; HLD; GERD; DM; and COPD, recently diagnosed with stage IV non-small cell lung CA status post palliative radiotherapy to right sacral metastatic bone lesion, currently undergoing systemic chemotherapy with carboplatin, paclitaxel, Keytruda status post 1 cycle, on 04/15/2018 with Neulasta support.  Patient presented with 2 days of profuse diarrhea > 10-12 times a day in addition to generalized weakness, fatigue and dehydration.    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 Julien Nordmann today, recommended high-dose prednisone for 1 week, then start the taper -Continue prednisone 60 mg daily for 1 week, then taper -Feeling better, no diarrhea, had a solid BM last night, no fevers, WBC count improving. -Low threshold of broad-spectrum antibiotics if starts having fevers  Active Problems:   Neutropenia (HCC) -Likely chemotherapy-induced neutropenia, patient had received Neulasta with chemotherapy -Oncology following, continue to monitor WBC count until ANC > 1000 before discharge. -Continue neutropenic precautions -WBC count improving  Anemia, thrombocytopenia -On chemotherapy, transfuse if hemoglobin less than 8 -H&H stable    Diabetes mellitus, type 2 (HCC) -Continue sliding scale insulin,  recent A1c 6.7    Chronic obstructive pulmonary disease (HCC) -Currently no wheezing, stable    CKD (chronic kidney disease) stage 3, GFR 30-59 ml/min (HCC) -Creatinine appears to be at baseline, stable    Essential hypertension -Continue Toprol-XL    Metastatic lung cancer (metastasis from lung to other site) [facility]) -Recently diagnosed in 03/2018, chest CT showed irregular spiculated 4.3x 1.6 cm apical right upper lung mass, lytic destructive expansile 5.0x 3.1 lateral left fourth rib mass, subsequently diagnosed with stage IV NSCLC -Started on chemotherapy with carboplatin, paclitaxel, Keytruda status post 1 cycle, on 04/15/2018 with Neulasta support.     Code Status: DNR DVT Prophylaxis: SCD's Family Communication: Discussed in detail with the patient, all imaging results, lab results explained to the patient    Disposition Plan: When medically ready, ANC >1000  Time Spent in minutes 35 minutes  Procedures:  None  Consultants:   Oncology  Antimicrobials:   Anti-infectives (From admission, onward)   None         Medications  Scheduled Meds: . ALPRAZolam  0.5 mg Oral QHS  . atorvastatin  20 mg Oral q1800  . clopidogrel  75 mg Oral Daily  . famotidine  20 mg Oral QHS  . feeding supplement  1 Container Oral TID  BM  . metoprolol succinate  25 mg Oral Daily  . mometasone-formoterol  2 puff Inhalation BID  . predniSONE  60 mg Oral Q breakfast   Continuous Infusions: . sodium chloride 100 mL/hr at 04/22/18 0859   PRN Meds:.acetaminophen **OR** [DISCONTINUED] acetaminophen, HYDROcodone-acetaminophen, lip balm, loperamide, morphine injection, ondansetron **OR** ondansetron (ZOFRAN) IV, traMADol      Subjective:   Martin [name] was seen and examined today.  No acute complaints, diarrhea improved, had a solid BM last night.  No fevers.  Patient denies dizziness, chest pain, shortness of breath, new weakness, numbess, tingling.   Objective:   Vitals:   04/21/18  1956 04/22/18 0435 04/22/18 0609 04/22/18 1314  BP:  (!) 133/57  134/61  Pulse:  90  98  Resp:  17  16  Temp:  98.5 F (36.9 C)  100 F (37.8 C)  TempSrc:  Oral  Oral  SpO2: 97% 97% 95% 96%  Weight:      Height:        Intake/Output Summary (Last 24 hours) at 04/22/2018 1339 Last data filed at 04/22/2018 1330 Gross per 24 hour  Intake 1930.58 ml  Output 200 ml  Net 1730.58 ml     Wt Readings from Last 3 Encounters:  04/20/18 65.1 kg  04/06/18 65.7 kg  03/30/18 66.2 kg    Physical Exam  General: Alert and oriented x 3, NAD  Eyes:   HEENT:  Atraumatic, normocephalic  Cardiovascular: S1 S2 clear, RRR. No pedal edema b/l  Respiratory: CTAB, no wheezing, rales or rhonchi  Gastrointestinal: Soft, nontender, nondistended, NBS  Ext: no pedal edema bilaterally  Neuro: no new deficits  Musculoskeletal: No cyanosis, clubbing  Skin: No rashes  Psych: Normal affect and demeanor, alert and oriented x3     Data Reviewed:  I have personally reviewed following labs and imaging studies  Micro Results No results found for this or any previous visit (from the past 240 hour(s)).  Radiology Reports Nm Pet Image Initial (pi) Skull Base To Thigh  Result Date: 04/01/2018 CLINICAL DATA:  Initial treatment strategy for lung cancer. EXAM: NUCLEAR MEDICINE PET SKULL BASE TO THIGH TECHNIQUE: 7.2 mCi F-18 FDG was injected intravenously. Full-ring PET imaging was performed from the skull base to thigh after the radiotracer. CT data was obtained and used for attenuation correction and anatomic localization. Fasting blood glucose: 159 mg/dl COMPARISON:  Multiple exams, including CT chest from 03/16/2018 FINDINGS: Mediastinal blood pool activity: SUV max 2.7 NECK: Expected photopenia associated with the right inferior medial cerebellar encephalomalacia. Incidental CT findings: Bilateral common carotid atherosclerotic calcification. CHEST: Irregular right apical mass, about 4.9 by 1.2 cm,  maximum SUV 6.5 along the upper pleural margin. Destructive lesion of the left fourth rib with associated pleural mass, approximately 5.1 by 2.9 cm, maximum SUV 12.8. Adjacent hypermetabolic pleural nodularity in the fourth intercostal region posteriorly, maximum SUV 6.6. Incidental CT findings: Coronary, aortic arch, and branch vessel atherosclerotic vascular disease. Trace left pleural effusion. Mild airway plugging in the left lower lobe. ABDOMEN/PELVIS: No significant abnormal hypermetabolic activity in this region. Incidental CT findings: Small gallstones are present. Aorta bi-iliac graft. Sigmoid colon diverticulosis. Moderate prostatomegaly. SKELETON: 4.8 by 3.7 cm lytic/destructive lesion of the right sacral ala, maximum SUV 6.1, impingement on the right sacral plexus not excluded. There is a small central lucency in the right fourth rib without accentuated metabolic activity, significance uncertain. Incidental CT findings: Degenerative glenohumeral arthropathy bilaterally. IMPRESSION: 1. Right apical mass measures 4.9 by 1.2 cm  and extends to the pleural surface, maximum SUV 6.5, concerning for Pancoast tumor. 2. Destructive lesion of the left fourth rib, maximum SUV 12.8, with extensive soft tissue component. There is also hypermetabolic nodularity along the left pleura posterior to this lesion, and a trace left pleural effusion. 3. 4.8 by 3.7 cm lytic lesion of the right sacral ala, maximum SUV 6.1, favoring metastatic disease. A possibility of myeloma is not completely excluded, and serologic tests for myeloma would be prudent. 4. Other imaging findings of potential clinical significance: Aortic Atherosclerosis (ICD10-I70.0). Coronary atherosclerosis. Mild airway plugging in the left lower lobe. Cholelithiasis. Aorta bi-iliac graft. Sigmoid colon diverticulosis. Moderate cardiomegaly. Degenerative glenohumeral arthropathy bilaterally. Old right inferior cerebellar stroke. Electronically Signed   By:  Van Clines M.D.   On: 04/01/2018 16:20    Lab Data:  CBC: Recent Labs  Lab 04/20/18 1225 04/21/18 0624 04/22/18 0653  WBC 0.5* 0.2* 0.5*  NEUTROABS 0.1*  --  0.1*  HGB 12.2* 9.7* 9.5*  HCT 38.7* 31.1* 30.6*  MCV 99.7 99.0 101.7*  PLT 89* 63* 62*   Basic Metabolic Panel: Recent Labs  Lab 04/20/18 1225 04/21/18 0624 04/22/18 0653  NA 134* 135 138  K 3.6 3.8 4.0  CL 102 107 114*  CO2 23 19* 20*  GLUCOSE 187* 186* 144*  BUN 37* 30* 22  CREATININE 1.23 1.04 0.97  CALCIUM 7.9* 7.0* 7.3*  MG  --   --  1.7   GFR: Estimated Creatinine Clearance: 50.2 mL/min (by C-G formula based on SCr of 0.97 mg/dL). Liver Function Tests: Recent Labs  Lab 04/20/18 1225  AST 18  ALT 21  ALKPHOS 60  BILITOT 1.0  PROT 6.3*  ALBUMIN 3.2*   No results for input(s): LIPASE, AMYLASE in the last 168 hours. No results for input(s): AMMONIA in the last 168 hours. Coagulation Profile: No results for input(s): INR, PROTIME in the last 168 hours. Cardiac Enzymes: No results for input(s): CKTOTAL, CKMB, CKMBINDEX, TROPONINI in the last 168 hours. BNP (last 3 results) No results for input(s): PROBNP in the last 8760 hours. HbA1C: No results for input(s): HGBA1C in the last 72 hours. CBG: No results for input(s): GLUCAP in the last 168 hours. Lipid Profile: No results for input(s): CHOL, HDL, LDLCALC, TRIG, CHOLHDL, LDLDIRECT in the last 72 hours. Thyroid Function Tests: No results for input(s): TSH, T4TOTAL, FREET4, T3FREE, THYROIDAB in the last 72 hours. Anemia Panel: No results for input(s): VITAMINB12, FOLATE, FERRITIN, TIBC, IRON, RETICCTPCT in the last 72 hours. Urine analysis:    Component Value Date/Time   COLORURINE YELLOW 03/15/2018 2147   APPEARANCEUR CLEAR 03/15/2018 2147   LABSPEC 1.013 03/15/2018 2147   PHURINE 6.0 03/15/2018 2147   GLUCOSEU 50 (A) 03/15/2018 2147   HGBUR NEGATIVE 03/15/2018 2147   BILIRUBINUR NEGATIVE 03/15/2018 2147   [place] NEGATIVE  03/15/2018 2147   PROTEINUR 100 (A) 03/15/2018 2147   UROBILINOGEN 0.2 07/16/2010 1429   NITRITE NEGATIVE 03/15/2018 2147   LEUKOCYTESUR NEGATIVE 03/15/2018 2147     Ripudeep Rai M.D. [facility] 04/22/2018, 1:39 PM  Pager: [phone] Between 7am to 7pm - call Pager - [phone]  After 7pm go to [email or web address] - password [email or web address]  Call night coverage person covering after 7pm

## 2018-04-22 NOTE — Clinical Social Work Note (Signed)
Clinical Social Work Assessment  Patient Details  Name: Martin [name] MRN: [id number] Date of Birth: February 14, 1931  Date of referral:  04/22/18               Reason for consult:  Facility Placement                Permission sought to share information with:    Permission granted to share information::  No  Name::        Agency::     Relationship::     Contact Information:     Housing/Transportation Living arrangements for the past 2 months:  Single Family Home Source of Information:  Patient Patient Interpreter Needed:  None Criminal Activity/Legal Involvement Pertinent to Current Situation/Hospitalization:    Significant Relationships:  Friend Lives with:  Self Do you feel safe going back to the place where you live?  Yes Need for family participation in patient care:  Yes (Comment)  Care giving concerns:  Martin [name] is a 85 y.o. male with medical history significant of CVA; PVD with renal artery steonsis; CAD; HRN; HLD; GERD; DM; and COPD presenting with diarrhea. He hasn't been able to eat, sleep, walk and was having nausea, vomiting, and diarrhea.  He took his shot Saturday at [facility] and he felt fine that evening.  His symptoms started on Sunday AM.  He has had 1 radiation treatment and has 2 more.  His back pain is gone after his radiation.  He was admitted from 1/6-9 for metastatic lung cancer with back pain.  Patient lives alone.  Social Worker assessment / plan:  LCSW consulted for SNF placement.   LCSW met at bed side with patient. Patient declined SNF at time of assessment.   Patient is from home alone. Patietn states he has friends that check on him and help him. Patient reports that he has been inpatient rehab before, once at [facility] and at [facility]. Patient has also had home health and is familiar with the process. At the time of assessment patient reports that he prefers home health.   Patient stated he has stairs in his home but does not use them. At baseline patient  uses a cane and walker. Patient is still driving and reports that he is independent in his ADLs. Patient reports that he is still driving.   PLAN: Patient declined SNF. Prefers home with home health. LCSW signing off.   Employment status:  Retired Forensic scientist:  Medicare PT Recommendations:  [place] / Referral to community resources:     Patient/Family's Response to care:  Patient is thankful for LCSW visit.   Patient/Family's Understanding of and Emotional Response to Diagnosis, Current Treatment, and Prognosis:  Patient states " I want to try home first." Patient is knowledgeable of SNF vs Home Health. Patient feels he can manage at home.   Emotional Assessment Appearance:  Appears stated age Attitude/Demeanor/Rapport:  Engaged Affect (typically observed):  Calm Orientation:  Oriented to Self, Oriented to Place, Oriented to  Time, Oriented to Situation Alcohol / Substance use:  Not Applicable Psych involvement (Current and /or in the community):  No (Comment)  Discharge Needs  Concerns to be addressed:  No discharge needs identified Readmission within the last 30 days:  No Current discharge risk:  None Barriers to Discharge:  Continued Medical Work up   [employer], LCSW 04/22/2018, 1:59 PM

## 2018-04-23 DIAGNOSIS — E860 Dehydration: Secondary | ICD-10-CM

## 2018-04-23 LAB — CBC WITH DIFFERENTIAL/PLATELET
Abs Immature Granulocytes: 0.01 10*3/uL (ref 0.00–0.07)
BASOS PCT: 1 %
Basophils Absolute: 0 10*3/uL (ref 0.0–0.1)
EOS ABS: 0.1 10*3/uL (ref 0.0–0.5)
Eosinophils Relative: 4 %
HCT: 30.5 % — ABNORMAL LOW (ref 39.0–52.0)
Hemoglobin: 9.5 g/dL — ABNORMAL LOW (ref 13.0–17.0)
Immature Granulocytes: 0 %
Lymphocytes Relative: 11 %
Lymphs Abs: 0.3 10*3/uL — ABNORMAL LOW (ref 0.7–4.0)
MCH: 31 pg (ref 26.0–34.0)
MCHC: 31.1 g/dL (ref 30.0–36.0)
MCV: 99.7 fL (ref 80.0–100.0)
Monocytes Absolute: 0.5 10*3/uL (ref 0.1–1.0)
Monocytes Relative: 21 %
Neutro Abs: 1.5 10*3/uL — ABNORMAL LOW (ref 1.7–7.7)
Neutrophils Relative %: 63 %
Platelets: 64 10*3/uL — ABNORMAL LOW (ref 150–400)
RBC: 3.06 MIL/uL — ABNORMAL LOW (ref 4.22–5.81)
RDW: 12.9 % (ref 11.5–15.5)
WBC: 2.3 10*3/uL — ABNORMAL LOW (ref 4.0–10.5)
nRBC: 0 % (ref 0.0–0.2)

## 2018-04-23 LAB — BASIC METABOLIC PANEL
Anion gap: 8 (ref 5–15)
BUN: 20 mg/dL (ref 8–23)
CALCIUM: 7.1 mg/dL — AB (ref 8.9–10.3)
CO2: 20 mmol/L — ABNORMAL LOW (ref 22–32)
Chloride: 110 mmol/L (ref 98–111)
Creatinine, Ser: 0.97 mg/dL (ref 0.61–1.24)
GFR calc Af Amer: >60 mL/min (ref >60)
GFR calc non Af Amer: >60 mL/min (ref >60)
Glucose, Bld: 141 mg/dL — ABNORMAL HIGH (ref 70–99)
Potassium: 3.8 mmol/L (ref 3.5–5.1)
Sodium: 138 mmol/L (ref 135–145)

## 2018-04-23 MED ORDER — POLYETHYLENE GLYCOL 3350 17 G PO PACK
17.0000 g | PACK | Freq: Every day | ORAL | Status: DC
  Administered 2018-04-23: 17 g via ORAL
  Filled 2018-04-23: qty 1

## 2018-04-23 NOTE — Progress Notes (Signed)
[facility]                                                                              Patient Demographics  Martin [name], is a 85 y.o. male, DOB - 04/25/1930, LKT:[id number]  Admit date - 04/20/2018   Admitting Physician Karmen Bongo, MD  Outpatient Primary MD for the patient is Pickard, Cammie Mcgee, MD  Outpatient specialists:   LOS - 2  days   Medical records reviewed and are as summarized below:    No chief complaint on file.      Brief summary   Patient is 85 year old male with history of CVA, PVD with renal artery stenosis, CAD; HRN; HLD; GERD; DM; and COPD, recently diagnosed with stage IV non-small cell lung CA status post palliative radiotherapy to right sacral metastatic bone lesion, currently undergoing systemic chemotherapy with carboplatin, paclitaxel, Keytruda status post 1 cycle, on 04/15/2018 with Neulasta support.  Patient presented with 2 days of profuse diarrhea > 10-12 times a day in addition to generalized weakness, fatigue and dehydration.    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 Julien Nordmann, recommended high-dose prednisone for 1 week, then start the taper -Continue prednisone 60 mg daily for 1 week, then taper -WBC count improving, diarrhea improving -Increase mobilization, PT OT  Active Problems:   Neutropenia (HCC) -Likely chemotherapy-induced neutropenia, patient had received Neulasta with chemotherapy -Oncology following, continue to monitor WBC count until [place] > 1000 before discharge. -WBC count improving, ANC >1000  Anemia, thrombocytopenia -On chemotherapy, transfuse if hemoglobin less than 8 -H&H stable    Diabetes mellitus, type 2 (HCC) -Continue sliding scale insulin, recent A1c 6.7    Chronic obstructive pulmonary disease (HCC) -Currently no wheezing, stable    CKD (chronic  kidney disease) stage 3, GFR 30-59 ml/min (HCC) -Creatinine appears to be at baseline, stable    Essential hypertension -Continue Toprol-XL    Metastatic lung cancer (metastasis from lung to other site) [facility]) -Recently diagnosed in 03/2018, chest CT showed irregular spiculated 4.3x 1.6 cm apical right upper lung mass, lytic destructive expansile 5.0x 3.1 lateral left fourth rib mass, subsequently diagnosed with stage IV NSCLC -Started on chemotherapy with carboplatin, paclitaxel, Keytruda status post 1 cycle, on 04/15/2018 with Neulasta support.     Code Status: DNR DVT Prophylaxis: SCD's Family Communication: Discussed in detail with the patient, all imaging results, lab results explained to the patient and family member in the room   Disposition Plan: Patient lives at home alone, declined skilled nursing facility, family member at the bedside.  Patient wants to continue PT today and home tomorrow if feeling better  Time Spent in minutes 25 minutes  Procedures:  None  Consultants:   Oncology  Antimicrobials:   Anti-infectives (From admission, onward)   None         Medications  Scheduled Meds: . ALPRAZolam  0.5 mg Oral QHS  . atorvastatin  20 mg Oral q1800  . clopidogrel  75 mg Oral Daily  . famotidine  20 mg Oral  QHS  . feeding supplement  1 Container Oral TID BM  . metoprolol succinate  25 mg Oral Daily  . mometasone-formoterol  2 puff Inhalation BID  . predniSONE  60 mg Oral Q breakfast   Continuous Infusions: . sodium chloride 100 mL/hr at 04/23/18 0428   PRN Meds:.acetaminophen **OR** [DISCONTINUED] acetaminophen, HYDROcodone-acetaminophen, lip balm, loperamide, morphine injection, ondansetron **OR** ondansetron (ZOFRAN) IV, traMADol      Subjective:   Martin [name] was seen and examined today.  Diarrhea improved, no nausea vomiting or abdominal pain.  No fevers.  Patient denies dizziness, chest pain, shortness of breath, new weakness, numbess, tingling.     Objective:   Vitals:   04/23/18 0448 04/23/18 0624 04/23/18 1047 04/23/18 1316  BP:  (!) 107/48  137/63  Pulse: 84 86  99  Resp:  17  17  Temp:  99.5 F (37.5 C)  97.9 F (36.6 C)  TempSrc:  Oral  Oral  SpO2: 98% 96% 94% 97%  Weight:      Height:        Intake/Output Summary (Last 24 hours) at 04/23/2018 1457 Last data filed at 04/22/2018 1859 Gross per 24 hour  Intake 240 ml  Output -  Net 240 ml     Wt Readings from Last 3 Encounters:  04/20/18 65.1 kg  04/06/18 65.7 kg  03/30/18 66.2 kg   Physical Exam  General: Alert and oriented x 3, NAD  Eyes:   HEENT:  Atraumatic, normocephalic  Cardiovascular: S1 S2 clear, RRR. No pedal edema b/l  Respiratory: CTAB, no wheezing, rales or rhonchi  Gastrointestinal: Soft, nontender, nondistended, NBS  Ext: no pedal edema bilaterally  Neuro: no new deficits  Musculoskeletal: No cyanosis, clubbing  Skin: No rashes  Psych: Normal affect and demeanor, alert and oriented x3      Data Reviewed:  I have personally reviewed following labs and imaging studies  Micro Results No results found for this or any previous visit (from the past 240 hour(s)).  Radiology Reports Nm Pet Image Initial (pi) Skull Base To Thigh  Result Date: 04/01/2018 CLINICAL DATA:  Initial treatment strategy for lung cancer. EXAM: NUCLEAR MEDICINE PET SKULL BASE TO THIGH TECHNIQUE: 7.2 mCi F-18 FDG was injected intravenously. Full-ring PET imaging was performed from the skull base to thigh after the radiotracer. CT data was obtained and used for attenuation correction and anatomic localization. Fasting blood glucose: 159 mg/dl COMPARISON:  Multiple exams, including CT chest from 03/16/2018 FINDINGS: Mediastinal blood pool activity: SUV max 2.7 NECK: Expected photopenia associated with the right inferior medial cerebellar encephalomalacia. Incidental CT findings: Bilateral common carotid atherosclerotic calcification. CHEST: Irregular right apical  mass, about 4.9 by 1.2 cm, maximum SUV 6.5 along the upper pleural margin. Destructive lesion of the left fourth rib with associated pleural mass, approximately 5.1 by 2.9 cm, maximum SUV 12.8. Adjacent hypermetabolic pleural nodularity in the fourth intercostal region posteriorly, maximum SUV 6.6. Incidental CT findings: Coronary, aortic arch, and branch vessel atherosclerotic vascular disease. Trace left pleural effusion. Mild airway plugging in the left lower lobe. ABDOMEN/PELVIS: No significant abnormal hypermetabolic activity in this region. Incidental CT findings: Small gallstones are present. Aorta bi-iliac graft. Sigmoid colon diverticulosis. Moderate prostatomegaly. SKELETON: 4.8 by 3.7 cm lytic/destructive lesion of the right sacral ala, maximum SUV 6.1, impingement on the right sacral plexus not excluded. There is a small central lucency in the right fourth rib without accentuated metabolic activity, significance uncertain. Incidental CT findings: Degenerative glenohumeral arthropathy bilaterally. IMPRESSION: 1. Right  apical mass measures 4.9 by 1.2 cm and extends to the pleural surface, maximum SUV 6.5, concerning for Pancoast tumor. 2. Destructive lesion of the left fourth rib, maximum SUV 12.8, with extensive soft tissue component. There is also hypermetabolic nodularity along the left pleura posterior to this lesion, and a trace left pleural effusion. 3. 4.8 by 3.7 cm lytic lesion of the right sacral ala, maximum SUV 6.1, favoring metastatic disease. A possibility of myeloma is not completely excluded, and serologic tests for myeloma would be prudent. 4. Other imaging findings of potential clinical significance: Aortic Atherosclerosis (ICD10-I70.0). Coronary atherosclerosis. Mild airway plugging in the left lower lobe. Cholelithiasis. Aorta bi-iliac graft. Sigmoid colon diverticulosis. Moderate cardiomegaly. Degenerative glenohumeral arthropathy bilaterally. Old right inferior cerebellar stroke.  Electronically Signed   By: Van Clines M.D.   On: 04/01/2018 16:20    Lab Data:  CBC: Recent Labs  Lab 04/20/18 1225 04/21/18 0624 04/22/18 0653 04/23/18 0632  WBC 0.5* 0.2* 0.5* 2.3*  NEUTROABS 0.1*  --  0.1* 1.5*  HGB 12.2* 9.7* 9.5* 9.5*  HCT 38.7* 31.1* 30.6* 30.5*  MCV 99.7 99.0 101.7* 99.7  PLT 89* 63* 62* 64*   Basic Metabolic Panel: Recent Labs  Lab 04/20/18 1225 04/21/18 0624 04/22/18 0653 04/23/18 0632  NA 134* 135 138 138  K 3.6 3.8 4.0 3.8  CL 102 107 114* 110  CO2 23 19* 20* 20*  GLUCOSE 187* 186* 144* 141*  BUN 37* 30* 22 20  CREATININE 1.23 1.04 0.97 0.97  CALCIUM 7.9* 7.0* 7.3* 7.1*  MG  --   --  1.7  --    GFR: Estimated Creatinine Clearance: 50.2 mL/min (by C-G formula based on SCr of 0.97 mg/dL). Liver Function Tests: Recent Labs  Lab 04/20/18 1225  AST 18  ALT 21  ALKPHOS 60  BILITOT 1.0  PROT 6.3*  ALBUMIN 3.2*   No results for input(s): LIPASE, AMYLASE in the last 168 hours. No results for input(s): AMMONIA in the last 168 hours. Coagulation Profile: No results for input(s): INR, PROTIME in the last 168 hours. Cardiac Enzymes: No results for input(s): CKTOTAL, CKMB, CKMBINDEX, TROPONINI in the last 168 hours. BNP (last 3 results) No results for input(s): PROBNP in the last 8760 hours. HbA1C: No results for input(s): HGBA1C in the last 72 hours. CBG: No results for input(s): GLUCAP in the last 168 hours. Lipid Profile: No results for input(s): CHOL, HDL, LDLCALC, TRIG, CHOLHDL, LDLDIRECT in the last 72 hours. Thyroid Function Tests: No results for input(s): TSH, T4TOTAL, FREET4, T3FREE, THYROIDAB in the last 72 hours. Anemia Panel: No results for input(s): VITAMINB12, FOLATE, FERRITIN, TIBC, IRON, RETICCTPCT in the last 72 hours. Urine analysis:    Component Value Date/Time   COLORURINE YELLOW 03/15/2018 2147   APPEARANCEUR CLEAR 03/15/2018 2147   LABSPEC 1.013 03/15/2018 2147   PHURINE 6.0 03/15/2018 2147    GLUCOSEU 50 (A) 03/15/2018 2147   HGBUR NEGATIVE 03/15/2018 2147   BILIRUBINUR NEGATIVE 03/15/2018 2147   [place] NEGATIVE 03/15/2018 2147   PROTEINUR 100 (A) 03/15/2018 2147   UROBILINOGEN 0.2 07/16/2010 1429   NITRITE NEGATIVE 03/15/2018 2147   LEUKOCYTESUR NEGATIVE 03/15/2018 2147     Esmay Amspacher M.D. [facility] 04/23/2018, 2:57 PM  Pager: [phone] Between 7am to 7pm - call Pager - [phone]  After 7pm go to [email or web address] - password [email or web address]  Call night coverage person covering after 7pm

## 2018-04-23 NOTE — Care Management Important Message (Signed)
Important Message  Patient Details  Name: LACEY DOTSON MRN: [id number] Date of Birth: November 24, 1930   Medicare Important Message Given:  Yes    Kerin Salen 04/23/2018, 12:28 [place] Message  Patient Details  Name: JAHMIR SALO MRN: [id number] Date of Birth: Dec 15, 1930   Medicare Important Message Given:  Yes    Kerin Salen 04/23/2018, 12:28 PM

## 2018-04-23 NOTE — Progress Notes (Signed)
Physical Therapy Treatment Patient Details Name: Martin [name] MRN: [id number] DOB: Feb 07, 1931 Today's Date: 04/23/2018    History of Present Illness Patient is 85 year old male with history of CVA, PVD with renal artery stenosis, CAD; HRN; HLD; GERD; DM; and COPD, recently diagnosed with stage IV non-small cell lung CA status post palliative radiotherapy to right sacral metastatic bone lesion, currently undergoing systemic chemotherapy with carboplatin, paclitaxel, Keytruda status post 1 cycle, on 04/15/2018 with Neulasta support.  Patient presented with 2 days of profuse diarrhea > 10-12 times a day in addition to generalized weakness, fatigue and dehydration.    PT Comments    Pt reports possible d/c home today however pt fatigued very quickly with ambulating and required chair due to LEs buckling (especially weaker R LE).  Pt does not feel safe for d/c home today.  Continue to recommend SNF however pt not agreeable so recommend HHPT.   Follow Up Recommendations  SNF(pt refusing SNF, recommend HHPT)     Equipment Recommendations  Rolling walker with 5" wheels    Recommendations for Other Services       Precautions / Restrictions Precautions Precautions: Fall    Mobility  Bed Mobility Overal bed mobility: Modified Independent                Transfers Overall transfer level: Needs assistance Equipment used: Rolling walker (2 wheeled) Transfers: Sit to/from Stand Sit to Stand: Min assist         General transfer comment: min/guard for rise and assist required to control descent as pt fatigued from ambulating  Ambulation/Gait Ambulation/Gait assistance: Min assist Gait Distance (Feet): 140 Feet Assistive device: Rolling walker (2 wheeled) Gait Pattern/deviations: Step-to pattern;Decreased stride length;Decreased stance time - right;Decreased step length - left     General Gait Details: reports R LE weakness since bypass surgery, but was not using walker  previously; pt required standing rest break and then requested chair as his LEs buckling from fatigue and weakness; pt returned to room in rolling chair and assisted to bed   Stairs             Wheelchair Mobility    Modified Rankin (Stroke Patients Only)       Balance                                            Cognition Arousal/Alertness: Awake/alert Behavior During Therapy: WFL for tasks assessed/performed Overall Cognitive Status: Within Functional Limits for tasks assessed                                        Exercises      General Comments        Pertinent Vitals/Pain Pain Assessment: No/denies pain    Home Living                      Prior Function            PT Goals (current goals can now be found in the care plan section) Progress towards PT goals: Progressing toward goals    Frequency    Min 3X/week      PT Plan Current plan remains appropriate    Co-evaluation  AM-PAC PT "6 Clicks" Mobility   Outcome Measure  Help needed turning from your back to your side while in a flat bed without using bedrails?: None Help needed moving from lying on your back to sitting on the side of a flat bed without using bedrails?: None Help needed moving to and from a bed to a chair (including a wheelchair)?: A Little Help needed standing up from a chair using your arms (e.g., wheelchair or bedside chair)?: A Little Help needed to walk in hospital room?: A Little Help needed climbing 3-5 steps with a railing? : A Little 6 Click Score: 20    End of Session Equipment Utilized During Treatment: Gait belt Activity Tolerance: Patient limited by fatigue Patient left: with call bell/phone within reach;in bed;with bed alarm set;with family/visitor present   PT Visit Diagnosis: Other abnormalities of gait and mobility (R26.89);Muscle weakness (generalized) (M62.81)     Time: 9562-1308 PT Time  Calculation (min) (ACUTE ONLY): 15 min  Charges:  $Gait Training: 8-22 mins                    Carmelia Bake, PT, DPT Acute Rehabilitation Services Office: [phone] Pager: [phone]  Trena Platt 04/23/2018, 1:36 PM

## 2018-04-24 DIAGNOSIS — I10 Essential (primary) hypertension: Secondary | ICD-10-CM | POA: Diagnosis not present

## 2018-04-24 DIAGNOSIS — C7951 Secondary malignant neoplasm of bone: Secondary | ICD-10-CM | POA: Diagnosis present

## 2018-04-24 DIAGNOSIS — Z743 Need for continuous supervision: Secondary | ICD-10-CM | POA: Diagnosis not present

## 2018-04-24 DIAGNOSIS — F411 Generalized anxiety disorder: Secondary | ICD-10-CM | POA: Diagnosis not present

## 2018-04-24 DIAGNOSIS — I70219 Atherosclerosis of native arteries of extremities with intermittent claudication, unspecified extremity: Secondary | ICD-10-CM | POA: Diagnosis not present

## 2018-04-24 DIAGNOSIS — R202 Paresthesia of skin: Secondary | ICD-10-CM | POA: Diagnosis not present

## 2018-04-24 DIAGNOSIS — Z5189 Encounter for other specified aftercare: Secondary | ICD-10-CM | POA: Diagnosis not present

## 2018-04-24 DIAGNOSIS — J410 Simple chronic bronchitis: Secondary | ICD-10-CM | POA: Diagnosis not present

## 2018-04-24 DIAGNOSIS — Z923 Personal history of irradiation: Secondary | ICD-10-CM | POA: Diagnosis not present

## 2018-04-24 DIAGNOSIS — Z5112 Encounter for antineoplastic immunotherapy: Secondary | ICD-10-CM | POA: Diagnosis not present

## 2018-04-24 DIAGNOSIS — E785 Hyperlipidemia, unspecified: Secondary | ICD-10-CM | POA: Diagnosis not present

## 2018-04-24 DIAGNOSIS — I6930 Unspecified sequelae of cerebral infarction: Secondary | ICD-10-CM | POA: Diagnosis not present

## 2018-04-24 DIAGNOSIS — I219 Acute myocardial infarction, unspecified: Secondary | ICD-10-CM | POA: Diagnosis not present

## 2018-04-24 DIAGNOSIS — N183 Chronic kidney disease, stage 3 (moderate): Secondary | ICD-10-CM | POA: Diagnosis not present

## 2018-04-24 DIAGNOSIS — J449 Chronic obstructive pulmonary disease, unspecified: Secondary | ICD-10-CM | POA: Diagnosis not present

## 2018-04-24 DIAGNOSIS — Z79899 Other long term (current) drug therapy: Secondary | ICD-10-CM | POA: Diagnosis not present

## 2018-04-24 DIAGNOSIS — C3490 Malignant neoplasm of unspecified part of unspecified bronchus or lung: Secondary | ICD-10-CM | POA: Diagnosis not present

## 2018-04-24 DIAGNOSIS — Z9221 Personal history of antineoplastic chemotherapy: Secondary | ICD-10-CM | POA: Diagnosis not present

## 2018-04-24 DIAGNOSIS — C3411 Malignant neoplasm of upper lobe, right bronchus or lung: Secondary | ICD-10-CM | POA: Diagnosis not present

## 2018-04-24 DIAGNOSIS — D696 Thrombocytopenia, unspecified: Secondary | ICD-10-CM | POA: Diagnosis not present

## 2018-04-24 DIAGNOSIS — G4700 Insomnia, unspecified: Secondary | ICD-10-CM | POA: Diagnosis not present

## 2018-04-24 DIAGNOSIS — I4891 Unspecified atrial fibrillation: Secondary | ICD-10-CM | POA: Diagnosis not present

## 2018-04-24 DIAGNOSIS — I2510 Atherosclerotic heart disease of native coronary artery without angina pectoris: Secondary | ICD-10-CM | POA: Diagnosis not present

## 2018-04-24 DIAGNOSIS — R112 Nausea with vomiting, unspecified: Secondary | ICD-10-CM | POA: Diagnosis not present

## 2018-04-24 DIAGNOSIS — R197 Diarrhea, unspecified: Secondary | ICD-10-CM | POA: Diagnosis not present

## 2018-04-24 DIAGNOSIS — R279 Unspecified lack of coordination: Secondary | ICD-10-CM | POA: Diagnosis not present

## 2018-04-24 DIAGNOSIS — R110 Nausea: Secondary | ICD-10-CM | POA: Diagnosis not present

## 2018-04-24 DIAGNOSIS — E118 Type 2 diabetes mellitus with unspecified complications: Secondary | ICD-10-CM | POA: Diagnosis not present

## 2018-04-24 DIAGNOSIS — I739 Peripheral vascular disease, unspecified: Secondary | ICD-10-CM | POA: Diagnosis not present

## 2018-04-24 DIAGNOSIS — K529 Noninfective gastroenteritis and colitis, unspecified: Secondary | ICD-10-CM | POA: Diagnosis not present

## 2018-04-24 DIAGNOSIS — M6281 Muscle weakness (generalized): Secondary | ICD-10-CM | POA: Diagnosis not present

## 2018-04-24 DIAGNOSIS — C801 Malignant (primary) neoplasm, unspecified: Secondary | ICD-10-CM | POA: Diagnosis not present

## 2018-04-24 DIAGNOSIS — R600 Localized edema: Secondary | ICD-10-CM | POA: Diagnosis not present

## 2018-04-24 DIAGNOSIS — E860 Dehydration: Secondary | ICD-10-CM | POA: Diagnosis not present

## 2018-04-24 DIAGNOSIS — Z5111 Encounter for antineoplastic chemotherapy: Secondary | ICD-10-CM | POA: Diagnosis not present

## 2018-04-24 DIAGNOSIS — D709 Neutropenia, unspecified: Secondary | ICD-10-CM | POA: Diagnosis not present

## 2018-04-24 LAB — BASIC METABOLIC PANEL
Anion gap: 7 (ref 5–15)
BUN: 18 mg/dL (ref 8–23)
CALCIUM: 6.8 mg/dL — AB (ref 8.9–10.3)
CO2: 19 mmol/L — ABNORMAL LOW (ref 22–32)
Chloride: 113 mmol/L — ABNORMAL HIGH (ref 98–111)
Creatinine, Ser: 0.87 mg/dL (ref 0.61–1.24)
GFR calc Af Amer: >60 mL/min (ref >60)
GLUCOSE: 116 mg/dL — AB (ref 70–99)
Potassium: 3.3 mmol/L — ABNORMAL LOW (ref 3.5–5.1)
Sodium: 139 mmol/L (ref 135–145)

## 2018-04-24 LAB — CBC WITH DIFFERENTIAL/PLATELET
Abs Immature Granulocytes: 0 10*3/uL (ref 0.00–0.07)
BASOS PCT: 0 %
Band Neutrophils: 11 %
Basophils Absolute: 0 10*3/uL (ref 0.0–0.1)
EOS PCT: 4 %
Eosinophils Absolute: 0.4 10*3/uL (ref 0.0–0.5)
HCT: 30.3 % — ABNORMAL LOW (ref 39.0–52.0)
Hemoglobin: 9.8 g/dL — ABNORMAL LOW (ref 13.0–17.0)
Lymphocytes Relative: 12 %
Lymphs Abs: 1.2 10*3/uL (ref 0.7–4.0)
MCH: 31.3 pg (ref 26.0–34.0)
MCHC: 32.3 g/dL (ref 30.0–36.0)
MCV: 96.8 fL (ref 80.0–100.0)
MONO ABS: 0.1 10*3/uL (ref 0.1–1.0)
Monocytes Relative: 1 %
NRBC: 0.3 % — AB (ref 0.0–0.2)
Neutro Abs: 8.5 10*3/uL — ABNORMAL HIGH (ref 1.7–7.7)
Neutrophils Relative %: 72 %
PLATELETS: 73 10*3/uL — AB (ref 150–400)
RBC: 3.13 MIL/uL — ABNORMAL LOW (ref 4.22–5.81)
RDW: 13.2 % (ref 11.5–15.5)
WBC: 10.3 10*3/uL (ref 4.0–10.5)

## 2018-04-24 MED ORDER — HYDROCODONE-ACETAMINOPHEN 5-325 MG PO TABS: 1.0000 | ORAL_TABLET | Freq: Four times a day (QID) | ORAL | 0 refills | Status: DC | PRN

## 2018-04-24 MED ORDER — PREDNISONE 20 MG PO TABS: ORAL_TABLET | ORAL | 0 refills | Status: DC

## 2018-04-24 MED ORDER — ONDANSETRON 4 MG PO TBDP: 4.0000 mg | ORAL_TABLET | Freq: Three times a day (TID) | ORAL | 0 refills | Status: AC | PRN

## 2018-04-24 MED ORDER — LOPERAMIDE HCL 2 MG PO CAPS: 2.0000 mg | ORAL_CAPSULE | Freq: Three times a day (TID) | ORAL | 0 refills | Status: AC | PRN

## 2018-04-24 MED ORDER — POLYETHYLENE GLYCOL 3350 17 G PO PACK: 17.0000 g | PACK | Freq: Every day | ORAL | 0 refills | Status: AC | PRN

## 2018-04-24 MED ORDER — TRAMADOL HCL 50 MG PO TABS: 50.0000 mg | ORAL_TABLET | Freq: Three times a day (TID) | ORAL | 0 refills | Status: DC | PRN

## 2018-04-24 NOTE — Clinical Social Work Placement (Signed)
Patient discharging to [facility].  Confirmed ability to return & faxed required docs. Patient agreeable to d/c plan. Patient informed sister of plan. [facility] will be called for transport.  RN call report: [phone]  CLINICAL SOCIAL WORK PLACEMENT  NOTE  Date:  04/24/2018  Patient Details  Name: Martin [name] MRN: [id number] Date of Birth: 1930-06-07  Clinical Social Work is seeking post-discharge placement for this patient at the [place] level of care (*CSW will initial, date and re-position this form in  chart as items are completed):  Yes   Patient/family provided with [facility] Work Department's list of facilities offering this level of care within the geographic area requested by the patient (or if unable, by the patient's family).  Yes   Patient/family informed of their freedom to choose among providers that offer the needed level of care, that participate in Medicare, Medicaid or managed care program needed by the patient, have an available bed and are willing to accept the patient.      Patient/family informed of [facility]'s ownership interest in [facility] and [facility], as well as of the fact that they are under no obligation to receive care at these facilities.  PASRR submitted to EDS on       PASRR number received on       Existing PASRR number confirmed on 04/24/18     FL2 transmitted to all facilities in geographic area requested by pt/family on       FL2 transmitted to all facilities within larger geographic area on 04/24/18     Patient informed that his/her managed care company has contracts with or will negotiate with certain facilities, including the following:        Yes   Patient/family informed of bed offers received.  Patient chooses bed at [facility]     Physician recommends and patient chooses bed at      Patient to be transferred to [facility] on  04/24/18.  Patient to be transferred to facility by PT[facility]     Patient family notified on 04/24/18 of transfer.  Name of family member notified:  Patient, patient notified sister via phone.     PHYSICIAN       Additional Comment:    _______________________________________________ Pricilla Holm, LCSWA 04/24/2018, 4:29 PM

## 2018-04-24 NOTE — Discharge Summary (Signed)
Physician Discharge Summary   Patient ID: Martin [name] MRN: [id number] DOB/AGE: 1930-12-23 85 y.o.  Admit date: 04/20/2018 Discharge date: 04/24/2018  Primary Care Physician:  Susy Frizzle, MD   Recommendations for Outpatient Follow-up:  1. Follow up with PCP in 1-2 weeks 2.  Home Health: SNF  Equipment/Devices:   Discharge Condition: stable  CODE STATUS: DNR   Diet recommendation: Carb modified diet   Discharge Diagnoses:    . 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 Neutropenia ([place]) . Anemia, thrombocytopenia (HCC) . Chronic obstructive pulmonary disease ([place]) . CKD (chronic kidney disease) stage 3, GFR 30-59 ml/min (HCC) . Essential hypertension . Metastatic lung cancer (metastasis from lung to other site) [facility]) Diet-controlled diabetes mellitus with renal complications, CKD  Consults: Oncology    Allergies:   Allergies  Allergen Reactions  . Penicillins Rash    DID THE REACTION INVOLVE: Swelling of the face/tongue/throat, SOB, or low BP? Yes Sudden or severe rash/hives, skin peeling, or the inside of the mouth or nose? No Did it require medical treatment? Was already in hosp When did it last happen?40 years ago If all above answers are "NO", may proceed with cephalosporin use.      DISCHARGE MEDICATIONS: Allergies as of 04/24/2018      Reactions   Penicillins Rash   DID THE REACTION INVOLVE: Swelling of the face/tongue/throat, SOB, or low BP? Yes Sudden or severe rash/hives, skin peeling, or the inside of the mouth or nose? No Did it require medical treatment? Was already in hosp When did it last happen?40 years ago If all above answers are "NO", may proceed with cephalosporin use.      Medication List    STOP taking these medications   ibuprofen 200 MG tablet Commonly known as:  ADVIL,MOTRIN   senna 8.6 MG Tabs tablet Commonly known as:  SENOKOT     TAKE these medications   ALPRAZolam 0.5 MG tablet Commonly known as:   XANAX TAKE 1 TABLET BY MOUTH AT BEDTIME   atorvastatin 20 MG tablet Commonly known as:  LIPITOR TAKE 1 TABLET BY MOUTH ONCE DAILY AT  6PM. What changed:  See the new instructions.   budesonide-formoterol 160-4.5 MCG/ACT inhaler Commonly known as:  SYMBICORT Inhale 2 puffs into the lungs 2 (two) times daily as needed (for COPD flares).   clopidogrel 75 MG tablet Commonly known as:  PLAVIX Take 1 tablet (75 mg total) by mouth daily.   famotidine 20 MG tablet Commonly known as:  PEPCID TAKE 1 TABLET BY MOUTH TWICE DAILY What changed:  when to take this   HYDROcodone-acetaminophen 5-325 MG tablet Commonly known as:  NORCO/VICODIN Take 1 tablet by mouth every 6 (six) hours as needed for moderate pain.   loperamide 2 MG capsule Commonly known as:  IMODIUM Take 1 capsule (2 mg total) by mouth 3 (three) times daily as needed for diarrhea or loose stools.   metoprolol succinate 25 MG 24 hr tablet Commonly known as:  TOPROL-XL TAKE 1 TABLET BY MOUTH ONCE DAILY   NITROSTAT 0.4 MG SL tablet Generic drug:  nitroGLYCERIN DISSOLVE ONE TABLET UNDER THE TONGUE AS DIRECTED What changed:  See the new instructions.   ondansetron 4 MG disintegrating tablet Commonly known as:  ZOFRAN ODT Take 1 tablet (4 mg total) by mouth every 8 (eight) hours as needed for nausea or vomiting.   polyethylene glycol packet Commonly known as:  MIRALAX / GLYCOLAX Take 17 g by mouth daily as needed for moderate  constipation or severe constipation. What changed:    when to take this  reasons to take this   predniSONE 20 MG tablet Commonly known as:  DELTASONE Prednisone dosing: Take  Prednisone 60mg  (3 tabs) x 3 more days, then taper to 40mg  (2 tabs) x 3 days, then 20mg  (1 tabs) x 3 days, then 10mg  (0.5mg  tab) x 3 days, then OFF.   traMADol 50 MG tablet Commonly known as:  ULTRAM Take 1 tablet (50 mg total) by mouth every 8 (eight) hours as needed.        Brief H and P: For complete details  please refer to admission H and P, but in brief Patient is 85 year old male with history of CVA, PVD with renal artery stenosis, CAD; HRN; HLD; GERD; DM; and COPD, recently diagnosed with stage IV non-small cell lung CA status post palliative radiotherapy to right sacral metastatic bone lesion, currently undergoing systemic chemotherapy with carboplatin, paclitaxel, Keytruda status post 1 cycle, on 04/15/2018 with Neulasta support.  Patient presented with 2 days of profuse diarrhea > 10-12 times a day in addition to generalized weakness, fatigue and dehydration.  Hospital Course:  Diarrhea in the setting of chemotherapy -Likely due to immunotherapy mediated colitis -Patient was placed on IV fluid hydration, now tolerating solid diet.   -Oncology was consulted, seen by Dr. Julien Nordmann, recommended high-dose prednisone for 1 week, then start the taper.  Tapered prednisone dose outlined in discharge instructions Diarrhea has improved, WBC count 10.3 at the time of discharge.     Neutropenia (HCC) -WBC count 0.2 at the time of admission -Likely chemotherapy-induced neutropenia, patient had received Neulasta with chemotherapy -Oncology following, continue to monitor WBC count until [place] > 1000 before discharge. -WBC count improved to 10.3 at the time of discharge  Anemia, thrombocytopenia -On chemotherapy, transfuse if hemoglobin less than 8 -H&H stable, 9.8 at the time of discharge    Diabetes mellitus, type 2 (HCC) -Diet controlled, recent A1c 6.7 -Patient was placed on sliding scale insulin while inpatient    Chronic obstructive pulmonary disease (HCC) -Currently no wheezing, stable    CKD (chronic kidney disease) stage 3, GFR 30-59 ml/min (HCC) -Creatinine appears to be at baseline, stable    Essential hypertension -Continue Toprol-XL    Metastatic lung cancer (metastasis from lung to other site) [facility]) -Recently diagnosed in 03/2018, chest CT showed irregular spiculated 4.3x 1.6 cm  apical right upper lung mass, lytic destructive expansile 5.0x 3.1 lateral left fourth rib mass, subsequently diagnosed with stage IV NSCLC -Started on chemotherapy with carboplatin, paclitaxel, Keytruda status post 1 cycle, on 04/15/2018 with Neulasta support.   - patient has appointments scheduled for labs for 2/19 and 2/26 and next cycle on 2/26 at [facility].   Generalized deconditioning, debility PT evaluation had recommended skilled nursing facility   Day of Discharge S: Diarrhea is improved, no acute issues, no fevers  BP (!) 162/70 (BP Location: Left Arm)   Pulse 87   Temp 98.7 F (37.1 C) (Oral)   Resp 20   Ht 5\' 6"  (1.676 m)   Wt 65.1 kg   SpO2 97%   BMI 23.16 kg/m   Physical Exam: General: Alert and awake oriented x3 not in any acute distress. HEENT: anicteric sclera, pupils reactive to light and accommodation CVS: S1-S2 clear no murmur rubs or gallops Chest: clear to auscultation bilaterally, no wheezing rales or rhonchi Abdomen: soft nontender, nondistended, normal bowel sounds Extremities: no cyanosis, clubbing or edema noted bilaterally Neuro: Cranial  nerves II-XII intact, no focal neurological deficits   The results of significant diagnostics from this hospitalization (including imaging, microbiology, ancillary and laboratory) are listed below for reference.      Procedures/Studies:  Nm Pet Image Initial (pi) Skull Base To Thigh  Result Date: 04/01/2018 CLINICAL DATA:  Initial treatment strategy for lung cancer. EXAM: NUCLEAR MEDICINE PET SKULL BASE TO THIGH TECHNIQUE: 7.2 mCi F-18 FDG was injected intravenously. Full-ring PET imaging was performed from the skull base to thigh after the radiotracer. CT data was obtained and used for attenuation correction and anatomic localization. Fasting blood glucose: 159 mg/dl COMPARISON:  Multiple exams, including CT chest from 03/16/2018 FINDINGS: Mediastinal blood pool activity: SUV max 2.7 NECK: Expected  photopenia associated with the right inferior medial cerebellar encephalomalacia. Incidental CT findings: Bilateral common carotid atherosclerotic calcification. CHEST: Irregular right apical mass, about 4.9 by 1.2 cm, maximum SUV 6.5 along the upper pleural margin. Destructive lesion of the left fourth rib with associated pleural mass, approximately 5.1 by 2.9 cm, maximum SUV 12.8. Adjacent hypermetabolic pleural nodularity in the fourth intercostal region posteriorly, maximum SUV 6.6. Incidental CT findings: Coronary, aortic arch, and branch vessel atherosclerotic vascular disease. Trace left pleural effusion. Mild airway plugging in the left lower lobe. ABDOMEN/PELVIS: No significant abnormal hypermetabolic activity in this region. Incidental CT findings: Small gallstones are present. Aorta bi-iliac graft. Sigmoid colon diverticulosis. Moderate prostatomegaly. SKELETON: 4.8 by 3.7 cm lytic/destructive lesion of the right sacral ala, maximum SUV 6.1, impingement on the right sacral plexus not excluded. There is a small central lucency in the right fourth rib without accentuated metabolic activity, significance uncertain. Incidental CT findings: Degenerative glenohumeral arthropathy bilaterally. IMPRESSION: 1. Right apical mass measures 4.9 by 1.2 cm and extends to the pleural surface, maximum SUV 6.5, concerning for Pancoast tumor. 2. Destructive lesion of the left fourth rib, maximum SUV 12.8, with extensive soft tissue component. There is also hypermetabolic nodularity along the left pleura posterior to this lesion, and a trace left pleural effusion. 3. 4.8 by 3.7 cm lytic lesion of the right sacral ala, maximum SUV 6.1, favoring metastatic disease. A possibility of myeloma is not completely excluded, and serologic tests for myeloma would be prudent. 4. Other imaging findings of potential clinical significance: Aortic Atherosclerosis (ICD10-I70.0). Coronary atherosclerosis. Mild airway plugging in the left lower  lobe. Cholelithiasis. Aorta bi-iliac graft. Sigmoid colon diverticulosis. Moderate cardiomegaly. Degenerative glenohumeral arthropathy bilaterally. Old right inferior cerebellar stroke. Electronically Signed   By: Van Clines M.D.   On: 04/01/2018 16:20      LAB RESULTS: Basic Metabolic Panel: Recent Labs  Lab 04/22/18 0653 04/23/18 0632 04/24/18 0547  NA 138 138 139  K 4.0 3.8 3.3*  CL 114* 110 113*  CO2 20* 20* 19*  GLUCOSE 144* 141* 116*  BUN 22 20 18   CREATININE 0.97 0.97 0.87  CALCIUM 7.3* 7.1* 6.8*  MG 1.7  --   --    Liver Function Tests: Recent Labs  Lab 04/20/18 1225  AST 18  ALT 21  ALKPHOS 60  BILITOT 1.0  PROT 6.3*  ALBUMIN 3.2*   No results for input(s): LIPASE, AMYLASE in the last 168 hours. No results for input(s): AMMONIA in the last 168 hours. CBC: Recent Labs  Lab 04/23/18 0632 04/24/18 0547  WBC 2.3* 10.3  NEUTROABS 1.5* 8.5*  HGB 9.5* 9.8*  HCT 30.5* 30.3*  MCV 99.7 96.8  PLT 64* 73*   Cardiac Enzymes: No results for input(s): CKTOTAL, CKMB, CKMBINDEX, TROPONINI in the  last 168 hours. BNP: Invalid input(s): POCBNP CBG: No results for input(s): GLUCAP in the last 168 hours.    Disposition and Follow-up: Discharge Instructions    Diet - low sodium heart healthy   Complete by:  As directed    Increase activity slowly   Complete by:  As directed        DISPOSITION: SNF    DISCHARGE FOLLOW-UP Follow-up Information    Susy Frizzle, MD. Schedule an appointment as soon as possible for a visit in 2 week(s).   Specialty:  Family Medicine Contact information: [address] [place] [address] [phone]            Time coordinating discharge:  35 minutes  Signed:   Estill Cotta M.D. [facility] 04/24/2018, 4:11 PM

## 2018-04-24 NOTE — NC FL2 (Signed)
[place] LEVEL OF CARE SCREENING TOOL     IDENTIFICATION  Patient Name: Martin [name] Birthdate: 07/29/1930 Sex: male Admission Date (Current Location): 04/20/2018  [facility] and [place] Number:  Herbalist and Address:  [facility],  [place] [address], [place]      Provider Number: [id number]  Attending Physician Name and Address:  Mendel Corning, MD  Relative Name and Phone Number:  Lucita Lora: [phone]    Current Level of Care: Hospital Recommended Level of Care: [place] Prior Approval Number:    Date Approved/Denied:   PASRR Number: [id number]  Discharge Plan: SNF    Current Diagnoses: Patient Active Problem List   Diagnosis Date Noted  . Dehydration   . Diarrhea 04/20/2018  . Neutropenia ([place]) 04/20/2018  . Goals of care, counseling/discussion 04/06/2018  . Encounter for antineoplastic chemotherapy 04/06/2018  . Encounter for antineoplastic immunotherapy 04/06/2018  . Bone metastasis ([place]) 03/18/2018  . Metastatic lung cancer (metastasis from lung to other site) ([place]) 03/16/2018  . Insomnia 10/05/2015  . Late effect of stroke 10/05/2015  . Slow transit constipation   . Nausea without vomiting   . Essential hypertension   . Anxiety state   . Cerebellar infarct ([place]) 07/17/2015  . Cerebellar stroke ([place])   . Coronary artery disease involving native coronary artery of native heart without angina pectoris   . Chronic obstructive pulmonary disease ([place])   . CKD (chronic kidney disease) stage 3, GFR 30-59 ml/min (HCC)   . Leukocytosis   . Thrombocytopenia ([place])   . Cerebrovascular accident (CVA) ([place])   . Stroke ([place]) 07/15/2015  . CVA (cerebral infarction) 07/15/2015  . Hypothermia 07/15/2015  . Atherosclerotic PVD with intermittent claudication ([place]) 12/29/2013  . Aftercare following surgery of the circulatory system, [place] 12/23/2012  . Atherosclerosis of native arteries of the extremities  with intermittent claudication 06/17/2012  . Diabetes mellitus, type 2 ([place]) 06/11/2012  . Peripheral vascular disease, unspecified ([place]) 05/29/2011  . HLD (hyperlipidemia) 08/14/2009  . MYOCARDIAL INFARCTION 08/14/2009  . ATRIAL FIBRILLATION 08/14/2009  . TOBACCO ABUSE, HX OF 08/14/2009    Orientation RESPIRATION BLADDER Height & Weight     Self, Time, Place, Situation  Normal Continent Weight: 143 lb 8 oz (65.1 kg) Height:  5\' 6"  (167.6 cm)  BEHAVIORAL SYMPTOMS/MOOD NEUROLOGICAL BOWEL NUTRITION STATUS      Continent Diet(Regular)  AMBULATORY STATUS COMMUNICATION OF NEEDS Skin   Limited Assist Verbally Normal                       Personal Care Assistance Level of Assistance  Feeding, Dressing, Bathing Bathing Assistance: Limited assistance Feeding assistance: Independent Dressing Assistance: Limited assistance     Functional Limitations Info  Sight, Speech, Hearing Sight Info: Adequate Hearing Info: Adequate Speech Info: Adequate    SPECIAL CARE FACTORS FREQUENCY  PT (By licensed PT), OT (By licensed OT)     PT Frequency: 5x/week OT Frequency: 5x/week            Contractures Contractures Info: Not present    Additional Factors Info  Code Status, Allergies Code Status Info: DNR Allergies Info: PENICILLINS            Current Medications (04/24/2018):  This is the current hospital active medication list Current Facility-Administered Medications  Medication Dose Route Frequency Provider Last Rate Last Dose  . acetaminophen (TYLENOL) tablet 650 mg  650 mg Oral Q6H PRN Karmen Bongo, MD  650 mg at 04/23/18 2218  . ALPRAZolam Duanne Moron) tablet 0.5 mg  0.5 mg Oral QHS Karmen Bongo, MD   0.5 mg at 04/23/18 2218  . atorvastatin (LIPITOR) tablet 20 mg  20 mg Oral q1800 Karmen Bongo, MD   20 mg at 04/23/18 1712  . clopidogrel (PLAVIX) tablet 75 mg  75 mg Oral Daily Karmen Bongo, MD   75 mg at 04/24/18 0920  . famotidine (PEPCID) tablet 20 mg  20 mg  Oral Ivery Quale, MD   20 mg at 04/23/18 2218  . feeding supplement (BOOST / RESOURCE BREEZE) liquid 1 Container  1 Container Oral TID BM Karmen Bongo, MD   1 Container at 04/23/18 [phone]  . HYDROcodone-acetaminophen (NORCO/VICODIN) 5-325 MG per tablet 1 tablet  1 tablet Oral Q6H PRN Karmen Bongo, MD      . lip balm (CARMEX) ointment   Topical PRN Rai, Ripudeep K, MD      . loperamide (IMODIUM) capsule 4 mg  4 mg Oral PRN Karmen Bongo, MD   4 mg at 04/20/18 1215  . metoprolol succinate (TOPROL-XL) 24 hr tablet 25 mg  25 mg Oral Daily Karmen Bongo, MD   25 mg at 04/23/18 1712  . mometasone-formoterol (DULERA) 200-5 MCG/ACT inhaler 2 puff  2 puff Inhalation BID Karmen Bongo, MD   2 puff at 04/24/18 1006  . morphine 2 MG/ML injection 2 mg  2 mg Intravenous Q2H PRN Karmen Bongo, MD      . ondansetron [facility]) tablet 4 mg  4 mg Oral Q6H PRN Karmen Bongo, MD       Or  . ondansetron [facility]) injection 4 mg  4 mg Intravenous Q6H PRN Karmen Bongo, MD      . polyethylene glycol (MIRALAX / GLYCOLAX) packet 17 g  17 g Oral Daily Lovey Newcomer T, NP   17 g at 04/23/18 2218  . predniSONE (DELTASONE) tablet 60 mg  60 mg Oral Q breakfast Karmen Bongo, MD   60 mg at 04/24/18 0920  . traMADol (ULTRAM) tablet 50 mg  50 mg Oral Q8H PRN Karmen Bongo, MD         Discharge Medications: Please see discharge summary for a list of discharge medications.  Relevant Imaging Results:  Relevant Lab Results:   Additional Information SSN: [id number]  Pricilla Holm, [place]

## 2018-04-24 NOTE — Progress Notes (Signed)
Report called to [employer].

## 2018-04-28 ENCOUNTER — Other Ambulatory Visit: Payer: Self-pay | Admitting: *Deleted

## 2018-04-28 ENCOUNTER — Inpatient Hospital Stay: Payer: Medicare Other

## 2018-04-28 DIAGNOSIS — C3491 Malignant neoplasm of unspecified part of right bronchus or lung: Secondary | ICD-10-CM

## 2018-04-28 NOTE — Patient Outreach (Signed)
[facility]) Care Management  04/28/2018  Martin [name] 08-21-1930 [id number]   Onsite visit to facility. Met with Martin [name], [name]. He is going to look into possible ALF for patient. Met with patient, he reports he would like to get stronger and go home, but his chemo and radiation have made him weak. He states he was independent prior to admission.  He states he has had a spinal tumor and has 2 tumors on his lung.  Patient reports he has some family and friends support.  RNCM reviewed [place] management program and left a packet.   Plan to monitor for any [facility] care management needs. May not have needs if he goes to ALF upon discharge.   Royetta Crochet. Laymond Purser, MSN, RN, [employer] , [place] [phone]) Business Cell  [phone]) Toll Free Office

## 2018-04-29 DIAGNOSIS — I739 Peripheral vascular disease, unspecified: Secondary | ICD-10-CM | POA: Diagnosis not present

## 2018-04-29 DIAGNOSIS — N183 Chronic kidney disease, stage 3 (moderate): Secondary | ICD-10-CM | POA: Diagnosis not present

## 2018-04-29 DIAGNOSIS — C3490 Malignant neoplasm of unspecified part of unspecified bronchus or lung: Secondary | ICD-10-CM | POA: Diagnosis not present

## 2018-04-29 DIAGNOSIS — R600 Localized edema: Secondary | ICD-10-CM | POA: Diagnosis not present

## 2018-05-04 DIAGNOSIS — C3490 Malignant neoplasm of unspecified part of unspecified bronchus or lung: Secondary | ICD-10-CM | POA: Diagnosis not present

## 2018-05-04 DIAGNOSIS — R110 Nausea: Secondary | ICD-10-CM | POA: Diagnosis not present

## 2018-05-04 DIAGNOSIS — G4700 Insomnia, unspecified: Secondary | ICD-10-CM | POA: Diagnosis not present

## 2018-05-05 ENCOUNTER — Encounter: Payer: Self-pay | Admitting: Physician Assistant

## 2018-05-05 ENCOUNTER — Inpatient Hospital Stay (HOSPITAL_BASED_OUTPATIENT_CLINIC_OR_DEPARTMENT_OTHER): Payer: Medicare Other | Admitting: Physician Assistant

## 2018-05-05 ENCOUNTER — Inpatient Hospital Stay: Payer: Medicare Other

## 2018-05-05 ENCOUNTER — Other Ambulatory Visit: Payer: Self-pay | Admitting: *Deleted

## 2018-05-05 ENCOUNTER — Telehealth: Payer: Self-pay | Admitting: Physician Assistant

## 2018-05-05 VITALS — BP 162/66 | HR 88 | Temp 98.4°F | Resp 20 | Ht 66.0 in | Wt 136.0 lb

## 2018-05-05 DIAGNOSIS — R197 Diarrhea, unspecified: Secondary | ICD-10-CM | POA: Diagnosis not present

## 2018-05-05 DIAGNOSIS — N183 Chronic kidney disease, stage 3 (moderate): Secondary | ICD-10-CM | POA: Diagnosis not present

## 2018-05-05 DIAGNOSIS — C3490 Malignant neoplasm of unspecified part of unspecified bronchus or lung: Secondary | ICD-10-CM

## 2018-05-05 DIAGNOSIS — C7951 Secondary malignant neoplasm of bone: Secondary | ICD-10-CM

## 2018-05-05 DIAGNOSIS — Z5111 Encounter for antineoplastic chemotherapy: Secondary | ICD-10-CM | POA: Diagnosis not present

## 2018-05-05 DIAGNOSIS — Z5112 Encounter for antineoplastic immunotherapy: Secondary | ICD-10-CM | POA: Diagnosis not present

## 2018-05-05 DIAGNOSIS — J449 Chronic obstructive pulmonary disease, unspecified: Secondary | ICD-10-CM

## 2018-05-05 DIAGNOSIS — Z5189 Encounter for other specified aftercare: Secondary | ICD-10-CM | POA: Diagnosis not present

## 2018-05-05 DIAGNOSIS — R110 Nausea: Secondary | ICD-10-CM | POA: Diagnosis not present

## 2018-05-05 DIAGNOSIS — C3411 Malignant neoplasm of upper lobe, right bronchus or lung: Secondary | ICD-10-CM

## 2018-05-05 DIAGNOSIS — Z79899 Other long term (current) drug therapy: Secondary | ICD-10-CM | POA: Diagnosis not present

## 2018-05-05 DIAGNOSIS — C3491 Malignant neoplasm of unspecified part of right bronchus or lung: Secondary | ICD-10-CM

## 2018-05-05 DIAGNOSIS — R5382 Chronic fatigue, unspecified: Secondary | ICD-10-CM

## 2018-05-05 DIAGNOSIS — Z7189 Other specified counseling: Secondary | ICD-10-CM

## 2018-05-05 LAB — CBC WITH DIFFERENTIAL (CANCER CENTER ONLY)
ABS IMMATURE GRANULOCYTES: 0.14 10*3/uL — AB (ref 0.00–0.07)
Basophils Absolute: 0.1 10*3/uL (ref 0.0–0.1)
Basophils Relative: 1 %
Eosinophils Absolute: 0 10*3/uL (ref 0.0–0.5)
Eosinophils Relative: 0 %
HEMATOCRIT: 35.6 % — AB (ref 39.0–52.0)
Hemoglobin: 11.4 g/dL — ABNORMAL LOW (ref 13.0–17.0)
Immature Granulocytes: 1 %
LYMPHS ABS: 0.7 10*3/uL (ref 0.7–4.0)
LYMPHS PCT: 7 %
MCH: 31.5 pg (ref 26.0–34.0)
MCHC: 32 g/dL (ref 30.0–36.0)
MCV: 98.3 fL (ref 80.0–100.0)
Monocytes Absolute: 0.7 10*3/uL (ref 0.1–1.0)
Monocytes Relative: 7 %
NEUTROS ABS: 9.1 10*3/uL — AB (ref 1.7–7.7)
Neutrophils Relative %: 84 %
Platelet Count: 181 10*3/uL (ref 150–400)
RBC: 3.62 MIL/uL — ABNORMAL LOW (ref 4.22–5.81)
RDW: 14 % (ref 11.5–15.5)
WBC Count: 10.8 10*3/uL — ABNORMAL HIGH (ref 4.0–10.5)
nRBC: 0 % (ref 0.0–0.2)

## 2018-05-05 LAB — CMP (CANCER CENTER ONLY)
ALT: 13 U/L (ref 0–44)
AST: 12 U/L — ABNORMAL LOW (ref 15–41)
Albumin: 2.5 g/dL — ABNORMAL LOW (ref 3.5–5.0)
Alkaline Phosphatase: 136 U/L — ABNORMAL HIGH (ref 38–126)
Anion gap: 9 (ref 5–15)
BUN: 16 mg/dL (ref 8–23)
CHLORIDE: 99 mmol/L (ref 98–111)
CO2: 30 mmol/L (ref 22–32)
Calcium: 7.5 mg/dL — ABNORMAL LOW (ref 8.9–10.3)
Creatinine: 1.22 mg/dL (ref 0.61–1.24)
GFR, Est AFR Am: >60 mL/min (ref >60)
GFR, Estimated: 54 mL/min — ABNORMAL LOW (ref >60)
Glucose, Bld: 305 mg/dL — ABNORMAL HIGH (ref 70–99)
POTASSIUM: 3.1 mmol/L — AB (ref 3.5–5.1)
Sodium: 138 mmol/L (ref 135–145)
Total Bilirubin: 0.5 mg/dL (ref 0.3–1.2)
Total Protein: 5.2 g/dL — ABNORMAL LOW (ref 6.5–8.1)

## 2018-05-05 LAB — TSH: TSH: 2.417 u[IU]/mL (ref 0.320–4.118)

## 2018-05-05 NOTE — Patient Outreach (Signed)
[place] [address] [facility]) Care Management  05/05/2018  LATRON RIBAS February 07, 1931 [id number]   Onsite visit to [employer].  Met with patient and his grandson.  Patient grandson reports they are going to hire some private duty aides to assist instead of going into an ALF.  Patient reports he wants someone to bring food in from [place] and help out around the house for a couple of hours a day.  Patient states he is going home next week.   RNCM reviewed [facility] care management services with grandson and reviewed packet left at last facility visit. At this time they are declining services, but will keep the packet for future reference.   Plan to sign off.   Royetta Crochet. Laymond Purser, MSN, RN, [employer] , [place] [phone]) Business Cell  [phone]) Toll Free Office

## 2018-05-05 NOTE — Telephone Encounter (Signed)
Gave avs and calendar, informed will call with infusion date once/if approved

## 2018-05-05 NOTE — Progress Notes (Signed)
[place] OFFICE PROGRESS NOTE  Susy Frizzle, MD [address] [place] [address]  DIAGNOSIS: Stage IV (T2b, N0, M1C) non-small cell carcinoma, undifferentiated histology presented with right upper lobe lung mass in addition to metastatic disease to the left fourth rib as well as right sacral area and pleural-based nodularity.  This was diagnosed in January 2020  PRIOR THERAPY: Palliative radiotherapy to the right sacral metastatic bone lesion under the care of Dr. Tammi Klippel  CURRENT THERAPY: Systemic chemotherapy with carboplatin for AUC of 5, paclitaxel 175 mg/M2 and Keytruda 200 mg IV every 3 weeks with Neulasta support. Status post 1 cycle. First dose February 6th, 2020.   INTERVAL HISTORY: Martin [name] 85 y.o. male returns to the clinic for a follow-up visit  He underwent his first dose of chemotherapy/immunotherapy on 04/15/2018. 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 He was placed on a 60 mg high dose prednisone taper and was discharged to a rehab facility on 04/24/2018. Since his hospitalization, the patient has been having some difficulties with strength/walking for which he is receiving physical therapy at rehab. He also is receiving occupational therapy due to difficulties with daily activities such as buttoning his shirt. The patient is expected to be discharged home on 05/11/2018. The patient lives alone. Presently, he is on 10 mg p.o.daily of the prednisone taper and is close to completing his prescription. His diarrhea has improved. The patient reports to having a bowel movement approximately every other day of normal color and consistency. He denied any associated fevers, vomiting, or hematochezia. Since the hospitalization, the patient still is reporting mild nausea and weight loss for which he is prescribed Zofran. Today, the patient is feeling fairly well.  He denies any chills or night  sweats. He reports baseline shortness of breath and cough secondary to his COPD but denies any chest pain or hemoptysis. He denies any headache or visual changes.  He denies any rashes or skin changes.  He is here today for evaluation and to discuss his treatment options.    MEDICAL HISTORY: Past Medical History:  Diagnosis Date  . Arthritis   . CKD (chronic kidney disease) stage 3, GFR 30-59 ml/min (HCC)   . COPD (chronic obstructive pulmonary disease) ([place])   . Diabetes mellitus    denies, reports resolved with diet  . GERD (gastroesophageal reflux disease)   . H/O: GI bleed   . Hiatal hernia   . Hyperlipidemia   . Hypertension   . Myocardial infarction [facility])    1978 & 2011  . Peripheral vascular disease, unspecified ([place])   . Renal artery stenosis ([place])   . Stroke [facility])     ALLERGIES:  is allergic to penicillins.  MEDICATIONS:  Current Outpatient Medications  Medication Sig Dispense Refill  . ALPRAZolam (XANAX) 0.5 MG tablet TAKE 1 TABLET BY MOUTH AT BEDTIME 30 tablet 2  . atorvastatin (LIPITOR) 20 MG tablet TAKE 1 TABLET BY MOUTH ONCE DAILY AT  6PM. (Patient taking differently: Take 20 mg by mouth daily at 6 PM. ) 90 tablet 0  . budesonide-formoterol (SYMBICORT) 160-4.5 MCG/ACT inhaler Inhale 2 puffs into the lungs 2 (two) times daily as needed (for COPD flares).    . clopidogrel (PLAVIX) 75 MG tablet Take 1 tablet (75 mg total) by mouth daily. 90 tablet 3  . famotidine (PEPCID) 20 MG tablet TAKE 1 TABLET BY MOUTH TWICE DAILY (Patient taking differently: Take  20 mg by mouth at bedtime. ) 60 tablet 5  . HYDROcodone-acetaminophen (NORCO/VICODIN) 5-325 MG tablet Take 1 tablet by mouth every 6 (six) hours as needed for moderate pain. 10 tablet 0  . loperamide (IMODIUM) 2 MG capsule Take 1 capsule (2 mg total) by mouth 3 (three) times daily as needed for diarrhea or loose stools. 30 capsule 0  . metoprolol succinate (TOPROL-XL) 25 MG 24 hr tablet TAKE 1 TABLET BY MOUTH ONCE DAILY 90  tablet 2  . NITROSTAT 0.4 MG SL tablet DISSOLVE ONE TABLET UNDER THE TONGUE AS DIRECTED (Patient taking differently: Place 0.4 mg under the tongue every 5 (five) minutes as needed for chest pain. ) 25 tablet 0  . ondansetron (ZOFRAN ODT) 4 MG disintegrating tablet Take 1 tablet (4 mg total) by mouth every 8 (eight) hours as needed for nausea or vomiting. 20 tablet 0  . polyethylene glycol (MIRALAX / GLYCOLAX) packet Take 17 g by mouth daily as needed for moderate constipation or severe constipation. 14 each 0  . predniSONE (DELTASONE) 20 MG tablet Prednisone dosing: Take  Prednisone 60mg  (3 tabs) x 3 more days, then taper to 40mg  (2 tabs) x 3 days, then 20mg  (1 tabs) x 3 days, then 10mg  (0.5mg  tab) x 3 days, then OFF. 21 tablet 0  . traMADol (ULTRAM) 50 MG tablet Take 1 tablet (50 mg total) by mouth every 8 (eight) hours as needed. 20 tablet 0   No current facility-administered medications for this visit.     SURGICAL HISTORY:  Past Surgical History:  Procedure Laterality Date  . Aortobifemoral BPG  07/22/10   and Right femoral-popliteal BPG    REVIEW OF SYSTEMS:   Review of Systems  Constitutional: Positive for appetite changes and weight loss secondary to nausea.  Negative for chills, fatigue, or fever HENT:   Negative for mouth sores, nosebleeds, sore throat and trouble swallowing.   Eyes: Negative for eye problems and icterus.  Respiratory: Positive for baseline cough and shortness of breath. Negative hemoptysis Cardiovascular: Negative for chest pain and leg swelling.  Gastrointestinal: Positive for nausea. Negative for abdominal pain, constipation, diarrhea, and vomiting.  Genitourinary: Negative for bladder incontinence, difficulty urinating, dysuria, frequency and hematuria.   Musculoskeletal: Negative for back pain, gait problem, neck pain and neck stiffness.  Skin: Negative for itching and rash.  Neurological: Negative for dizziness, extremity weakness, gait problem, headaches,  light-headedness and seizures.  Hematological: Negative for adenopathy. Does not bruise/bleed easily.  Psychiatric/Behavioral: Negative for confusion, depression and sleep disturbance. The patient is not nervous/anxious.     PHYSICAL EXAMINATION:  Blood pressure (!) 162/66, pulse 88, temperature 98.4 F (36.9 C), temperature source Oral, resp. rate 20, height 5\' 6"  (1.676 m), weight 136 lb (61.7 kg), SpO2 96 %.  ECOG PERFORMANCE STATUS: 1 - Symptomatic but completely ambulatory  Physical Exam  Constitutional: Oriented to person, place, and time and well-developed, well-nourished, and in no distress. No distress. Patient was examined in the wheelchair HENT:  Head: Normocephalic and atraumatic.  Mouth/Throat: Upper dentures noted. Oropharynx is clear and moist. No oropharyngeal exudate.  Eyes: Conjunctivae are normal. Right eye exhibits no discharge. Left eye exhibits no discharge. No scleral icterus.  Neck: Normal range of motion. Neck supple.  Cardiovascular: Normal rate, regular rhythm, normal heart sounds and intact distal pulses.   Pulmonary/Chest: Diffuse wheezing bilaterally. Effort normal. No respiratory distress.  No rales.  Abdominal: Soft. Bowel sounds are normal. Exhibits no distension and no mass. There is no tenderness.  Musculoskeletal: Normal range of motion. Exhibits no edema.  Lymphadenopathy:    No cervical adenopathy.  Neurological: Alert and oriented to person, place, and time. Exhibits normal muscle tone. Gait normal. Coordination normal.  Skin: Skin is warm and dry. No rash noted. Not diaphoretic. No erythema. No pallor.  Psychiatric: Mood, memory and judgment normal.  Vitals reviewed.  LABORATORY DATA: Lab Results  Component Value Date   WBC 10.8 (H) 05/05/2018   HGB 11.4 (L) 05/05/2018   HCT 35.6 (L) 05/05/2018   MCV 98.3 05/05/2018   PLT 181 05/05/2018      Chemistry      Component Value Date/Time   NA 138 05/05/2018 0946   K 3.1 (L) 05/05/2018 0946    CL 99 05/05/2018 0946   CO2 30 05/05/2018 0946   BUN 16 05/05/2018 0946   CREATININE 1.22 05/05/2018 0946   CREATININE 1.15 (H) 03/30/2018 1239      Component Value Date/Time   CALCIUM 7.5 (L) 05/05/2018 0946   ALKPHOS 136 (H) 05/05/2018 0946   AST 12 (L) 05/05/2018 0946   ALT 13 05/05/2018 0946   BILITOT 0.5 05/05/2018 0946       RADIOGRAPHIC STUDIES:  No results found.   ASSESSMENT/PLAN:  This is a very pleasant 85 year old Caucasian male with metastatic non-small cell lung cancer, of undifferentiated histology. He presented with a right upper lobe lung mass in addition to a left chest wall mass as well as metastatic sacral lytic lesions. He was diagnosed in January 2020.   Patient underwent palliative radiotherapy to the right sacral area with improvement to his pain. He was recently treated with palliative systemic chemotherapy and immunotherapy with carboplatin for an AUC of 5, paclitaxel 175 mg, and Keytruda 200 mg IV every 3 weeks with Neulasta support. The patient received his first dose of treatment on April 15, 2018.  He is status post 1 cycle.  The patient was seen with Dr. Julien Nordmann today. 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 He is currently on 10 mg p.o daily of his high dose prednisone taper. The patient is doing well and reports to having a bowel movement approximately every other day of normal color and consistency. Recommend for him to continue on his taper. No additional management needed at this time.  Dr. Julien Nordmann had a lenghty discussion with the patient about his current condition and treatment options. The patient was given the option of resuming treatment with a dose reduction of systemic chemotherapy and immunotherapy with close monitoring  vs. palliative care.   The patient is interested in resuming treatment with a reduced dose of chemotherapy with carboplatin for an AUC of 4, paclitaxel 150 mg/m2, and  Keytruda 200 mg IV every 3 weeks. He wishes to restart treatment with cycle #2 next week. The side effects were reviewed with the patient including but not limited to alopecia, myelosuppression, nausea and vomiting, peripheral neuropathy,  skin rash, diarrhea, inflammation of the lung, kidney, liver, thyroid or other endocrine dysfunction, liver or renal dysfunction.   We will see the patient back for a follow up visit in 4 weeks before starting cycle #3.   The patient mentioned difficulty getting to his appointments since his hospitalization has interfered with his ability to drive himself. I have contacted transportation services on his behalf to assess his eligibility.    The patient will continue with his inhalers for his baseline shortness of breath secondary to COPD.   For symptom management, the  patient was advised to take his Zofran and imodium as needed for nausea and diarrhea.  The patient was advised to call immediately if she has any concerning symptoms in the interval. The patient voices understanding of current disease status and treatment options and is in agreement with the current care plan. All questions were answered. The patient knows to call the clinic with any problems, questions or concerns. We can certainly see the patient much sooner if necessary  No orders of the defined types were placed in this encounter.    Masami Plata L Yareli Carthen, PA-C 05/05/18  ADDENDUM: Hematology/Oncology Attending: I had a face-to-face encounter with the patient today.  I recommended his care plan.  This is a very pleasant 85 years old white male diagnosed with metastatic poorly differentiated carcinoma.  He is status post palliative radiotherapy to the metastatic bone disease in the sacral area.  He was started on systemic chemotherapy with carboplatin, paclitaxel and Keytruda status post 1 cycle.  He was admitted to the hospital few days after his treatment with significant diarrhea and  this was concerning for immunotherapy mediated colitis.  He was started on high-dose prednisone and his diarrhea significantly improved.  He is currently on 10 mg p.o. daily for the next few days. I had a lengthy discussion with the patient today about his condition and treatment options.  I gave him the option of resuming his treatment with reduced dose carboplatin for AUC of 4, paclitaxel 150 mg/M2 and Keytruda 200 mg IV every 3 weeks.  The patient would like to delay his treatment by at least 1 week to gain more time and recovery.  We will start his treatment next week.  He will come back for follow-up visit in 4 weeks for evaluation before starting cycle #3.  The patient was advised to call immediately if he has any concerning symptoms in the interval. Eilleen Kempf, MD 05/05/18

## 2018-05-06 ENCOUNTER — Other Ambulatory Visit: Payer: Self-pay

## 2018-05-06 ENCOUNTER — Ambulatory Visit
Admission: RE | Admit: 2018-05-06 | Discharge: 2018-05-06 | Disposition: A | Discharge status: Home / Self Care | Payer: No Typology Code available for payment source | Source: Ambulatory Visit | Attending: Urology | Admitting: Urology

## 2018-05-06 ENCOUNTER — Encounter: Payer: Self-pay | Admitting: Urology

## 2018-05-06 VITALS — BP 167/76 | HR 85 | Temp 98.1°F | Wt 136.6 lb

## 2018-05-06 DIAGNOSIS — R202 Paresthesia of skin: Secondary | ICD-10-CM | POA: Insufficient documentation

## 2018-05-06 DIAGNOSIS — C801 Malignant (primary) neoplasm, unspecified: Secondary | ICD-10-CM | POA: Diagnosis not present

## 2018-05-06 DIAGNOSIS — Z923 Personal history of irradiation: Secondary | ICD-10-CM | POA: Diagnosis not present

## 2018-05-06 DIAGNOSIS — K529 Noninfective gastroenteritis and colitis, unspecified: Secondary | ICD-10-CM | POA: Diagnosis not present

## 2018-05-06 DIAGNOSIS — Z79899 Other long term (current) drug therapy: Secondary | ICD-10-CM | POA: Insufficient documentation

## 2018-05-06 DIAGNOSIS — Z9221 Personal history of antineoplastic chemotherapy: Secondary | ICD-10-CM | POA: Insufficient documentation

## 2018-05-06 DIAGNOSIS — C7951 Secondary malignant neoplasm of bone: Secondary | ICD-10-CM

## 2018-05-06 NOTE — Progress Notes (Signed)
Radiation Oncology         [phone] ________________________________  Name: Martin [name] MRN: [id number]  Date: 05/06/2018  DOB: 1930-04-11  Post Treatment Note  CC: Martin Frizzle, MD  Shelly Coss, MD  Diagnosis:   85 y.o. gentleman with right sacral metastasis from presumed lung cancer primary     Interval Since Last Radiation:  5 weeks  03/19/2018 - 04/01/2018:  Right Sacrum / treated to 30 Gy delivered in 10 fractions of 3 Gy  Narrative:  The patient returns today for routine follow-up.  He tolerated radiation treatment relatively well. He experienced an increase in urinary frequency, mild fatigue, and constipation. He denied nausea or vomiting, focal weakness, and paresthesias. He reported a significant improvement in his back pain with treatments.  He was started on systemic chemotherapy with carboplatin, paclitaxel and Keytruda every 3 weeks with Neulasta support- first dose February 6th, 2020, under the care and direction of Dr. Julien Nordmann.  Unfortunately, he developed immunotherapy mediated colitis which prompted recent hospital admission on 04/19/18 due to profuse diarrhea > 10-12 times a day in addition to generalized weakness, fatigue and dehydration.  This was treated with high dose steroids and he was able to discharge home on 04/24/18.  He was also noted to have neutropenia, anemia and thrombocytopenia related to his systemic treatment but fortunately, his counts remained stable and gradually improved without the need for transfusion prior to discharge.  Recent labs on 05/05/18 demonstrated improved Hgb of 11.4 and improving ANC at 9.1.                          On review of systems, the patient states that he is doing relatively well.  He is currently in a skilled nursing facility for rehab/strength and conditioning with plans to discharge home on Tuesday, March 3.  He denies any ill side effects associated with the radiation treatments but reports that his low back pain has  completely resolved.  Currently, he denies abdominal pain, nausea, vomiting, diarrhea or constipation.  He reports a decreased appetite but is eating to maintain his weight.  He has experienced some paresthesias in his fingertips on both hands.  He continues with mild fatigue but feels that this is gradually improving and that he is making good progress with physical therapy at the facility.  Overall, he is pleased with his progress to date but remains nervous/anxious regarding future chemo treatments.  ALLERGIES:  is allergic to penicillins.  Meds: Current Outpatient Medications  Medication Sig Dispense Refill  . ALPRAZolam (XANAX) 0.5 MG tablet TAKE 1 TABLET BY MOUTH AT BEDTIME 30 tablet 2  . atorvastatin (LIPITOR) 20 MG tablet TAKE 1 TABLET BY MOUTH ONCE DAILY AT  6PM. (Patient taking differently: Take 20 mg by mouth daily at 6 PM. ) 90 tablet 0  . budesonide-formoterol (SYMBICORT) 160-4.5 MCG/ACT inhaler Inhale 2 puffs into the lungs 2 (two) times daily as needed (for COPD flares).    . clopidogrel (PLAVIX) 75 MG tablet Take 1 tablet (75 mg total) by mouth daily. 90 tablet 3  . famotidine (PEPCID) 20 MG tablet TAKE 1 TABLET BY MOUTH TWICE DAILY (Patient taking differently: Take 20 mg by mouth at bedtime. ) 60 tablet 5  . loperamide (IMODIUM) 2 MG capsule Take 1 capsule (2 mg total) by mouth 3 (three) times daily as needed for diarrhea or loose stools. 30 capsule 0  . metoprolol succinate (TOPROL-XL) 25 MG 24 hr tablet  TAKE 1 TABLET BY MOUTH ONCE DAILY 90 tablet 2  . ondansetron (ZOFRAN ODT) 4 MG disintegrating tablet Take 1 tablet (4 mg total) by mouth every 8 (eight) hours as needed for nausea or vomiting. 20 tablet 0  . polyethylene glycol (MIRALAX / GLYCOLAX) packet Take 17 g by mouth daily as needed for moderate constipation or severe constipation. 14 each 0  . predniSONE (DELTASONE) 20 MG tablet Prednisone dosing: Take  Prednisone 60mg  (3 tabs) x 3 more days, then taper to 40mg  (2 tabs) x 3  days, then 20mg  (1 tabs) x 3 days, then 10mg  (0.5mg  tab) x 3 days, then OFF. 21 tablet 0  . traMADol (ULTRAM) 50 MG tablet Take 1 tablet (50 mg total) by mouth every 8 (eight) hours as needed. 20 tablet 0  . HYDROcodone-acetaminophen (NORCO/VICODIN) 5-325 MG tablet Take 1 tablet by mouth every 6 (six) hours as needed for moderate pain. (Patient not taking: Reported on 05/06/2018) 10 tablet 0  . NITROSTAT 0.4 MG SL tablet DISSOLVE ONE TABLET UNDER THE TONGUE AS DIRECTED (Patient not taking: No sig reported) 25 tablet 0   No current facility-administered medications for this encounter.     Physical Findings:  weight is 136 lb 9.6 oz (62 kg). His oral temperature is 98.1 F (36.7 C). His blood pressure is 167/76 (abnormal) and his pulse is 85. His oxygen saturation is 100%.  Pain Assessment Pain Score: 0-No pain/10 In general this is a well appearing Caucasian male in no acute distress.  He's alert and oriented x4 and appropriate throughout the examination. Cardiopulmonary assessment is negative for acute distress and he exhibits normal effort.   Lab Findings: Lab Results  Component Value Date   WBC 10.8 (H) 05/05/2018   HGB 11.4 (L) 05/05/2018   HCT 35.6 (L) 05/05/2018   MCV 98.3 05/05/2018   PLT 181 05/05/2018     Radiographic Findings: No results found.  Impression/Plan: 1. 85 y.o. gentleman with painful right sacral metastasis from stage IV, T2b, N0, M1c, NSCLC. He appears to have recovered well from his recent radiotherapy and has had significant improvement in the lower back pain since completion of treatment.  He has also continued with significant improvement in regards to his recent immunotherapy mediated colitis and is feeling much stronger since discharge from the hospital.  We discussed that while we are happy to continue to participate in his care if clinically indicated, at this point, we will plan to see him back on an as-needed basis.  He will continue in routine follow-up  for management of his disease under the care and direction of Dr. Earlie Server.  He knows to call at anytime with any questions or concerns related to his previous radiotherapy.    Nicholos Johns, PA-C

## 2018-05-07 ENCOUNTER — Inpatient Hospital Stay: Payer: Medicare Other

## 2018-05-07 ENCOUNTER — Telehealth: Payer: Self-pay | Admitting: Physician Assistant

## 2018-05-07 MED ORDER — PEGFILGRASTIM-CBQV 6 MG/0.6ML ~~LOC~~ SOSY
PREFILLED_SYRINGE | SUBCUTANEOUS | Status: AC
  Filled 2018-05-07: qty 0.6

## 2018-05-07 NOTE — Telephone Encounter (Signed)
Called patient to inform of approved chemo next week. Spoke with patient. Confirmed dated and time of 3/2 appt. Will mail calendar

## 2018-05-10 ENCOUNTER — Telehealth: Payer: Self-pay | Admitting: Medical Oncology

## 2018-05-10 ENCOUNTER — Inpatient Hospital Stay: Payer: Medicare Other

## 2018-05-10 ENCOUNTER — Other Ambulatory Visit: Payer: Self-pay | Admitting: Internal Medicine

## 2018-05-10 ENCOUNTER — Inpatient Hospital Stay: Payer: Medicare Other | Attending: Internal Medicine

## 2018-05-10 ENCOUNTER — Other Ambulatory Visit: Payer: Self-pay | Admitting: Medical Oncology

## 2018-05-10 VITALS — BP 133/67 | HR 92 | Temp 98.2°F | Resp 18

## 2018-05-10 DIAGNOSIS — Z5111 Encounter for antineoplastic chemotherapy: Secondary | ICD-10-CM | POA: Diagnosis not present

## 2018-05-10 DIAGNOSIS — Z5189 Encounter for other specified aftercare: Secondary | ICD-10-CM | POA: Diagnosis not present

## 2018-05-10 DIAGNOSIS — Z5112 Encounter for antineoplastic immunotherapy: Secondary | ICD-10-CM | POA: Insufficient documentation

## 2018-05-10 DIAGNOSIS — C3491 Malignant neoplasm of unspecified part of right bronchus or lung: Secondary | ICD-10-CM

## 2018-05-10 DIAGNOSIS — C3411 Malignant neoplasm of upper lobe, right bronchus or lung: Secondary | ICD-10-CM | POA: Insufficient documentation

## 2018-05-10 LAB — CBC WITH DIFFERENTIAL (CANCER CENTER ONLY)
Abs Immature Granulocytes: 0.09 10*3/uL — ABNORMAL HIGH (ref 0.00–0.07)
BASOS PCT: 0 %
Basophils Absolute: 0 10*3/uL (ref 0.0–0.1)
EOS ABS: 0.1 10*3/uL (ref 0.0–0.5)
Eosinophils Relative: 1 %
HCT: 36.3 % — ABNORMAL LOW (ref 39.0–52.0)
Hemoglobin: 11.5 g/dL — ABNORMAL LOW (ref 13.0–17.0)
Immature Granulocytes: 1 %
Lymphocytes Relative: 8 %
Lymphs Abs: 0.9 10*3/uL (ref 0.7–4.0)
MCH: 31.8 pg (ref 26.0–34.0)
MCHC: 31.7 g/dL (ref 30.0–36.0)
MCV: 100.3 fL — ABNORMAL HIGH (ref 80.0–100.0)
Monocytes Absolute: 0.7 10*3/uL (ref 0.1–1.0)
Monocytes Relative: 7 %
NRBC: 0 % (ref 0.0–0.2)
Neutro Abs: 8.9 10*3/uL — ABNORMAL HIGH (ref 1.7–7.7)
Neutrophils Relative %: 83 %
PLATELETS: 174 10*3/uL (ref 150–400)
RBC: 3.62 MIL/uL — ABNORMAL LOW (ref 4.22–5.81)
RDW: 14.6 % (ref 11.5–15.5)
WBC Count: 10.7 10*3/uL — ABNORMAL HIGH (ref 4.0–10.5)

## 2018-05-10 LAB — CMP (CANCER CENTER ONLY)
ALT: 12 U/L (ref 0–44)
AST: 11 U/L — ABNORMAL LOW (ref 15–41)
Albumin: 2.5 g/dL — ABNORMAL LOW (ref 3.5–5.0)
Alkaline Phosphatase: 134 U/L — ABNORMAL HIGH (ref 38–126)
Anion gap: 10 (ref 5–15)
BUN: 16 mg/dL (ref 8–23)
CO2: 25 mmol/L (ref 22–32)
Calcium: 8.5 mg/dL — ABNORMAL LOW (ref 8.9–10.3)
Chloride: 104 mmol/L (ref 98–111)
Creatinine: 1.12 mg/dL (ref 0.61–1.24)
GFR, Est AFR Am: >60 mL/min (ref >60)
GFR, Estimated: 60 mL/min — ABNORMAL LOW (ref >60)
Glucose, Bld: 159 mg/dL — ABNORMAL HIGH (ref 70–99)
POTASSIUM: 3.8 mmol/L (ref 3.5–5.1)
SODIUM: 139 mmol/L (ref 135–145)
Total Bilirubin: 0.5 mg/dL (ref 0.3–1.2)
Total Protein: 6 g/dL — ABNORMAL LOW (ref 6.5–8.1)

## 2018-05-10 MED ORDER — DIPHENHYDRAMINE HCL 50 MG/ML IJ SOLN
50.0000 mg | Freq: Once | INTRAMUSCULAR | Status: AC
  Administered 2018-05-10: 50 mg via INTRAVENOUS

## 2018-05-10 MED ORDER — PALONOSETRON HCL INJECTION 0.25 MG/5ML
INTRAVENOUS | Status: AC
  Filled 2018-05-10: qty 5

## 2018-05-10 MED ORDER — DIPHENHYDRAMINE HCL 50 MG/ML IJ SOLN
INTRAMUSCULAR | Status: AC
  Filled 2018-05-10: qty 1

## 2018-05-10 MED ORDER — SODIUM CHLORIDE 0.9 % IV SOLN
200.0000 mg | Freq: Once | INTRAVENOUS | Status: AC
  Administered 2018-05-10: 200 mg via INTRAVENOUS
  Filled 2018-05-10: qty 8

## 2018-05-10 MED ORDER — PALONOSETRON HCL INJECTION 0.25 MG/5ML
0.2500 mg | Freq: Once | INTRAVENOUS | Status: AC
  Administered 2018-05-10: 0.25 mg via INTRAVENOUS

## 2018-05-10 MED ORDER — SODIUM CHLORIDE 0.9 % IV SOLN
150.0000 mg/m2 | Freq: Once | INTRAVENOUS | Status: AC
  Administered 2018-05-10: 264 mg via INTRAVENOUS
  Filled 2018-05-10: qty 44

## 2018-05-10 MED ORDER — SODIUM CHLORIDE 0.9 % IV SOLN
Freq: Once | INTRAVENOUS | Status: AC
  Administered 2018-05-10: 09:00:00 via INTRAVENOUS
  Filled 2018-05-10: qty 250

## 2018-05-10 MED ORDER — SODIUM CHLORIDE 0.9 % IV SOLN
20.0000 mg | Freq: Once | INTRAVENOUS | Status: AC
  Administered 2018-05-10: 20 mg via INTRAVENOUS
  Filled 2018-05-10: qty 2

## 2018-05-10 MED ORDER — FAMOTIDINE IN NACL 20-0.9 MG/50ML-% IV SOLN: 20.0000 mg | Freq: Once | INTRAVENOUS | Status: DC

## 2018-05-10 MED ORDER — SODIUM CHLORIDE 0.9 % IV SOLN
279.2000 mg | Freq: Once | INTRAVENOUS | Status: AC
  Administered 2018-05-10: 280 mg via INTRAVENOUS
  Filled 2018-05-10: qty 28

## 2018-05-10 MED ORDER — SODIUM CHLORIDE 0.9 % IV SOLN
Freq: Once | INTRAVENOUS | Status: AC
  Administered 2018-05-10: 10:00:00 via INTRAVENOUS
  Filled 2018-05-10: qty 5

## 2018-05-10 NOTE — Patient Instructions (Addendum)
[facility] Discharge Instructions for Patients Receiving Chemotherapy  NEW RX FOR PREDNISONE FAXED TO [facility]  Today you received the following chemotherapy agents Keytruda, Taxol and Craboplatin  To help prevent nausea and vomiting after your treatment, we encourage you to take your nausea medication as prescribed.   If you develop nausea and vomiting that is not controlled by your nausea medication, call the clinic.   BELOW ARE SYMPTOMS THAT SHOULD BE REPORTED IMMEDIATELY:  *FEVER GREATER THAN 100.5 F  *CHILLS WITH OR WITHOUT FEVER  NAUSEA AND VOMITING THAT IS NOT CONTROLLED WITH YOUR NAUSEA MEDICATION  *UNUSUAL SHORTNESS OF BREATH  *UNUSUAL BRUISING OR BLEEDING  TENDERNESS IN MOUTH AND THROAT WITH OR WITHOUT PRESENCE OF ULCERS  *URINARY PROBLEMS  *BOWEL PROBLEMS  UNUSUAL RASH Items with * indicate a potential emergency and should be followed up as soon as possible.  Feel free to call the clinic should you have any questions or concerns. The clinic phone number is [phone].  Please show the [facility] at check-in to the Emergency Department and triage nurse.  Pembrolizumab injection(Keytruda) What is this medicine? PEMBROLIZUMAB (pem broe liz ue mab) is a monoclonal antibody. It is used to treat cervical cancer, esophageal cancer, head and neck cancer, hepatocellular cancer, Hodgkin lymphoma, kidney cancer, lymphoma, melanoma, Merkel cell carcinoma, lung cancer, stomach cancer, urothelial cancer, and cancers that have a certain genetic condition. This medicine may be used for other purposes; ask your health care provider or pharmacist if you have questions. COMMON BRAND NAME(S): Keytruda What should I tell my health care provider before I take this medicine? They need to know if you have any of these conditions: -diabetes -immune system problems -inflammatory bowel disease -liver disease -lung or breathing disease -lupus -received  or scheduled to receive an organ transplant or a stem-cell transplant that uses donor stem cells -an unusual or allergic reaction to pembrolizumab, other medicines, foods, dyes, or preservatives -pregnant or trying to get pregnant -breast-feeding How should I use this medicine? This medicine is for infusion into a vein. It is given by a health care professional in a hospital or clinic setting. A special MedGuide will be given to you before each treatment. Be sure to read this information carefully each time. Talk to your pediatrician regarding the use of this medicine in children. While this drug may be prescribed for selected conditions, precautions do apply. Overdosage: If you think you have taken too much of this medicine contact a poison control center or emergency room at once. NOTE: This medicine is only for you. Do not share this medicine with others. What if I miss a dose? It is important not to miss your dose. Call your doctor or health care professional if you are unable to keep an appointment. What may interact with this medicine? Interactions have not been studied. Give your health care provider a list of all the medicines, herbs, non-prescription drugs, or dietary supplements you use. Also tell them if you smoke, drink alcohol, or use illegal drugs. Some items may interact with your medicine. This list may not describe all possible interactions. Give your health care provider a list of all the medicines, herbs, non-prescription drugs, or dietary supplements you use. Also tell them if you smoke, drink alcohol, or use illegal drugs. Some items may interact with your medicine. What should I watch for while using this medicine? Your condition will be monitored carefully while you are receiving this medicine. You may need blood work  done while you are taking this medicine. Do not become pregnant while taking this medicine or for 4 months after stopping it. Women should inform their doctor  if they wish to become pregnant or think they might be pregnant. There is a potential for serious side effects to an unborn child. Talk to your health care professional or pharmacist for more information. Do not breast-feed an infant while taking this medicine or for 4 months after the last dose. What side effects may I notice from receiving this medicine? Side effects that you should report to your doctor or health care professional as soon as possible: -allergic reactions like skin rash, itching or hives, swelling of the face, lips, or tongue -bloody or black, tarry -breathing problems -changes in vision -chest pain -chills -confusion -constipation -cough -diarrhea -dizziness or feeling faint or lightheaded -fast or irregular heartbeat -fever -flushing -hair loss -joint pain -low blood counts - this medicine may decrease the number of white blood cells, red blood cells and platelets. You may be at increased risk for infections and bleeding. -muscle pain -muscle weakness -persistent headache -redness, blistering, peeling or loosening of the skin, including inside the mouth -signs and symptoms of high blood sugar such as dizziness; dry mouth; dry skin; fruity breath; nausea; stomach pain; increased hunger or thirst; increased urination -signs and symptoms of kidney injury like trouble passing urine or change in the amount of urine -signs and symptoms of liver injury like dark urine, light-colored stools, loss of appetite, nausea, right upper belly pain, yellowing of the eyes or skin -sweating -swollen lymph nodes -weight loss Side effects that usually do not require medical attention (report to your doctor or health care professional if they continue or are bothersome): -decreased appetite -muscle pain -tiredness This list may not describe all possible side effects. Call your doctor for medical advice about side effects. You may report side effects to FDA at 1-800-FDA-1088. Where  should I keep my medicine? This drug is given in a hospital or clinic and will not be stored at home. NOTE: This sheet is a summary. It may not cover all possible information. If you have questions about this medicine, talk to your doctor, pharmacist, or health care provider.  2019 Elsevier/Gold Standard (2017-10-08 15:06:10)  Paclitaxel injection(Taxol) What is this medicine? PACLITAXEL (PAK li TAX el) is a chemotherapy drug. It targets fast dividing cells, like cancer cells, and causes these cells to die. This medicine is used to treat ovarian cancer, breast cancer, lung cancer, Kaposi's sarcoma, and other cancers. This medicine may be used for other purposes; ask your health care provider or pharmacist if you have questions. COMMON BRAND NAME(S): Onxol, Taxol What should I tell my health care provider before I take this medicine? They need to know if you have any of these conditions: -history of irregular heartbeat -liver disease -low blood counts, like low white cell, platelet, or red cell counts -lung or breathing disease, like asthma -tingling of the fingers or toes, or other nerve disorder -an unusual or allergic reaction to paclitaxel, alcohol, polyoxyethylated castor oil, other chemotherapy, other medicines, foods, dyes, or preservatives -pregnant or trying to get pregnant -breast-feeding How should I use this medicine? This drug is given as an infusion into a vein. It is administered in a hospital or clinic by a specially trained health care professional. Talk to your pediatrician regarding the use of this medicine in children. Special care may be needed. Overdosage: If you think you have taken too much of  this medicine contact a poison control center or emergency room at once. NOTE: This medicine is only for you. Do not share this medicine with others. What if I miss a dose? It is important not to miss your dose. Call your doctor or health care professional if you are unable to  keep an appointment. What may interact with this medicine? Do not take this medicine with any of the following medications: -disulfiram -metronidazole This medicine may also interact with the following medications: -antiviral medicines for hepatitis, HIV or AIDS -certain antibiotics like erythromycin and clarithromycin -certain medicines for fungal infections like ketoconazole and itraconazole -certain medicines for seizures like carbamazepine, phenobarbital, phenytoin -gemfibrozil -nefazodone -rifampin -St. John's wort This list may not describe all possible interactions. Give your health care provider a list of all the medicines, herbs, non-prescription drugs, or dietary supplements you use. Also tell them if you smoke, drink alcohol, or use illegal drugs. Some items may interact with your medicine. What should I watch for while using this medicine? Your condition will be monitored carefully while you are receiving this medicine. You will need important blood work done while you are taking this medicine. This medicine can cause serious allergic reactions. To reduce your risk you will need to take other medicine(s) before treatment with this medicine. If you experience allergic reactions like skin rash, itching or hives, swelling of the face, lips, or tongue, tell your doctor or health care professional right away. In some cases, you may be given additional medicines to help with side effects. Follow all directions for their use. This drug may make you feel generally unwell. This is not uncommon, as chemotherapy can affect healthy cells as well as cancer cells. Report any side effects. Continue your course of treatment even though you feel ill unless your doctor tells you to stop. Call your doctor or health care professional for advice if you get a fever, chills or sore throat, or other symptoms of a cold or flu. Do not treat yourself. This drug decreases your body's ability to fight infections.  Try to avoid being around people who are sick. This medicine may increase your risk to bruise or bleed. Call your doctor or health care professional if you notice any unusual bleeding. Be careful brushing and flossing your teeth or using a toothpick because you may get an infection or bleed more easily. If you have any dental work done, tell your dentist you are receiving this medicine. Avoid taking products that contain aspirin, acetaminophen, ibuprofen, naproxen, or ketoprofen unless instructed by your doctor. These medicines may hide a fever. Do not become pregnant while taking this medicine. Women should inform their doctor if they wish to become pregnant or think they might be pregnant. There is a potential for serious side effects to an unborn child. Talk to your health care professional or pharmacist for more information. Do not breast-feed an infant while taking this medicine. Men are advised not to father a child while receiving this medicine. This product may contain alcohol. Ask your pharmacist or healthcare provider if this medicine contains alcohol. Be sure to tell all healthcare providers you are taking this medicine. Certain medicines, like metronidazole and disulfiram, can cause an unpleasant reaction when taken with alcohol. The reaction includes flushing, headache, nausea, vomiting, sweating, and increased thirst. The reaction can last from 30 minutes to several hours. What side effects may I notice from receiving this medicine? Side effects that you should report to your doctor or health care professional  as soon as possible: -allergic reactions like skin rash, itching or hives, swelling of the face, lips, or tongue -breathing problems -changes in vision -fast, irregular heartbeat -high or low blood pressure -mouth sores -pain, tingling, numbness in the hands or feet -signs of decreased platelets or bleeding - bruising, pinpoint red spots on the skin, black, tarry stools, blood in  the urine -signs of decreased red blood cells - unusually weak or tired, feeling faint or lightheaded, falls -signs of infection - fever or chills, cough, sore throat, pain or difficulty passing urine -signs and symptoms of liver injury like dark yellow or brown urine; general ill feeling or flu-like symptoms; light-colored stools; loss of appetite; nausea; right upper belly pain; unusually weak or tired; yellowing of the eyes or skin -swelling of the ankles, feet, hands -unusually slow heartbeat Side effects that usually do not require medical attention (report to your doctor or health care professional if they continue or are bothersome): -diarrhea -hair loss -loss of appetite -muscle or joint pain -nausea, vomiting -pain, redness, or irritation at site where injected -tiredness This list may not describe all possible side effects. Call your doctor for medical advice about side effects. You may report side effects to FDA at 1-800-FDA-1088. Where should I keep my medicine? This drug is given in a hospital or clinic and will not be stored at home. NOTE: This sheet is a summary. It may not cover all possible information. If you have questions about this medicine, talk to your doctor, pharmacist, or health care provider.  2019 Elsevier/Gold Standard (2016-10-28 13:14:55)   Carboplatin injection What is this medicine? CARBOPLATIN (KAR boe pla tin) is a chemotherapy drug. It targets fast dividing cells, like cancer cells, and causes these cells to die. This medicine is used to treat ovarian cancer and many other cancers. This medicine may be used for other purposes; ask your health care provider or pharmacist if you have questions. COMMON BRAND NAME(S): Paraplatin What should I tell my health care provider before I take this medicine? They need to know if you have any of these conditions: -blood disorders -hearing problems -kidney disease -recent or ongoing radiation therapy -an unusual  or allergic reaction to carboplatin, cisplatin, other chemotherapy, other medicines, foods, dyes, or preservatives -pregnant or trying to get pregnant -breast-feeding How should I use this medicine? This drug is usually given as an infusion into a vein. It is administered in a hospital or clinic by a specially trained health care professional. Talk to your pediatrician regarding the use of this medicine in children. Special care may be needed. Overdosage: If you think you have taken too much of this medicine contact a poison control center or emergency room at once. NOTE: This medicine is only for you. Do not share this medicine with others. What if I miss a dose? It is important not to miss a dose. Call your doctor or health care professional if you are unable to keep an appointment. What may interact with this medicine? -medicines for seizures -medicines to increase blood counts like filgrastim, pegfilgrastim, sargramostim -some antibiotics like amikacin, gentamicin, neomycin, streptomycin, tobramycin -vaccines Talk to your doctor or health care professional before taking any of these medicines: -acetaminophen -aspirin -ibuprofen -ketoprofen -naproxen This list may not describe all possible interactions. Give your health care provider a list of all the medicines, herbs, non-prescription drugs, or dietary supplements you use. Also tell them if you smoke, drink alcohol, or use illegal drugs. Some items may interact with your  medicine. What should I watch for while using this medicine? Your condition will be monitored carefully while you are receiving this medicine. You will need important blood work done while you are taking this medicine. This drug may make you feel generally unwell. This is not uncommon, as chemotherapy can affect healthy cells as well as cancer cells. Report any side effects. Continue your course of treatment even though you feel ill unless your doctor tells you to  stop. In some cases, you may be given additional medicines to help with side effects. Follow all directions for their use. Call your doctor or health care professional for advice if you get a fever, chills or sore throat, or other symptoms of a cold or flu. Do not treat yourself. This drug decreases your body's ability to fight infections. Try to avoid being around people who are sick. This medicine may increase your risk to bruise or bleed. Call your doctor or health care professional if you notice any unusual bleeding. Be careful brushing and flossing your teeth or using a toothpick because you may get an infection or bleed more easily. If you have any dental work done, tell your dentist you are receiving this medicine. Avoid taking products that contain aspirin, acetaminophen, ibuprofen, naproxen, or ketoprofen unless instructed by your doctor. These medicines may hide a fever. Do not become pregnant while taking this medicine. Women should inform their doctor if they wish to become pregnant or think they might be pregnant. There is a potential for serious side effects to an unborn child. Talk to your health care professional or pharmacist for more information. Do not breast-feed an infant while taking this medicine. What side effects may I notice from receiving this medicine? Side effects that you should report to your doctor or health care professional as soon as possible: -allergic reactions like skin rash, itching or hives, swelling of the face, lips, or tongue -signs of infection - fever or chills, cough, sore throat, pain or difficulty passing urine -signs of decreased platelets or bleeding - bruising, pinpoint red spots on the skin, black, tarry stools, nosebleeds -signs of decreased red blood cells - unusually weak or tired, fainting spells, lightheadedness -breathing problems -changes in hearing -changes in vision -chest pain -high blood pressure -low blood counts - This drug may  decrease the number of white blood cells, red blood cells and platelets. You may be at increased risk for infections and bleeding. -nausea and vomiting -pain, swelling, redness or irritation at the injection site -pain, tingling, numbness in the hands or feet -problems with balance, talking, walking -trouble passing urine or change in the amount of urine Side effects that usually do not require medical attention (report to your doctor or health care professional if they continue or are bothersome): -hair loss -loss of appetite -metallic taste in the mouth or changes in taste This list may not describe all possible side effects. Call your doctor for medical advice about side effects. You may report side effects to FDA at 1-800-FDA-1088. Where should I keep my medicine? This drug is given in a hospital or clinic and will not be stored at home. NOTE: This sheet is a summary. It may not cover all possible information. If you have questions about this medicine, talk to your doctor, pharmacist, or health care provider.  2019 Elsevier/Gold Standard (2007-06-01 14:38:05)   Pegfilgrastim injection (Neulasta) What is this medicine? PEGFILGRASTIM (PEG fil gra stim) is a long-acting granulocyte colony-stimulating factor that stimulates the growth of neutrophils,  a type of white blood cell important in the body's fight against infection. It is used to reduce the incidence of fever and infection in patients with certain types of cancer who are receiving chemotherapy that affects the bone marrow, and to increase survival after being exposed to high doses of radiation. This medicine may be used for other purposes; ask your health care provider or pharmacist if you have questions. COMMON BRAND NAME(S): Domenic Moras, UDENYCA What should I tell my health care provider before I take this medicine? They need to know if you have any of these conditions: -kidney disease -latex allergy -ongoing radiation  therapy -sickle cell disease -skin reactions to acrylic adhesives (On-Body Injector only) -an unusual or allergic reaction to pegfilgrastim, filgrastim, other medicines, foods, dyes, or preservatives -pregnant or trying to get pregnant -breast-feeding How should I use this medicine? This medicine is for injection under the skin. If you get this medicine at home, you will be taught how to prepare and give the pre-filled syringe or how to use the On-body Injector. Refer to the patient Instructions for Use for detailed instructions. Use exactly as directed. Tell your healthcare provider immediately if you suspect that the On-body Injector may not have performed as intended or if you suspect the use of the On-body Injector resulted in a missed or partial dose. It is important that you put your used needles and syringes in a special sharps container. Do not put them in a trash can. If you do not have a sharps container, call your pharmacist or healthcare provider to get one. Talk to your pediatrician regarding the use of this medicine in children. While this drug may be prescribed for selected conditions, precautions do apply. Overdosage: If you think you have taken too much of this medicine contact a poison control center or emergency room at once. NOTE: This medicine is only for you. Do not share this medicine with others. What if I miss a dose? It is important not to miss your dose. Call your doctor or health care professional if you miss your dose. If you miss a dose due to an On-body Injector failure or leakage, a new dose should be administered as soon as possible using a single prefilled syringe for manual use. What may interact with this medicine? Interactions have not been studied. Give your health care provider a list of all the medicines, herbs, non-prescription drugs, or dietary supplements you use. Also tell them if you smoke, drink alcohol, or use illegal drugs. Some items may interact with  your medicine. This list may not describe all possible interactions. Give your health care provider a list of all the medicines, herbs, non-prescription drugs, or dietary supplements you use. Also tell them if you smoke, drink alcohol, or use illegal drugs. Some items may interact with your medicine. What should I watch for while using this medicine? You may need blood work done while you are taking this medicine. If you are going to need a MRI, CT scan, or other procedure, tell your doctor that you are using this medicine (On-Body Injector only). What side effects may I notice from receiving this medicine? Side effects that you should report to your doctor or health care professional as soon as possible: -allergic reactions like skin rash, itching or hives, swelling of the face, lips, or tongue -back pain -dizziness -fever -pain, redness, or irritation at site where injected -pinpoint red spots on the skin -red or dark-brown urine -shortness of breath or breathing problems -  stomach or side pain, or pain at the shoulder -swelling -tiredness -trouble passing urine or change in the amount of urine Side effects that usually do not require medical attention (report to your doctor or health care professional if they continue or are bothersome): -bone pain -muscle pain This list may not describe all possible side effects. Call your doctor for medical advice about side effects. You may report side effects to FDA at 1-800-FDA-1088. Where should I keep my medicine? Keep out of the reach of children. If you are using this medicine at home, you will be instructed on how to store it. Throw away any unused medicine after the expiration date on the label. NOTE: This sheet is a summary. It may not cover all possible information. If you have questions about this medicine, talk to your doctor, pharmacist, or health care provider.  2019 Elsevier/Gold Standard (2017-06-01 16:57:08)

## 2018-05-10 NOTE — Telephone Encounter (Signed)
Needs new rx for prednisone 10 mg . Done and faxed to [facility].

## 2018-05-11 DIAGNOSIS — C7951 Secondary malignant neoplasm of bone: Secondary | ICD-10-CM | POA: Diagnosis not present

## 2018-05-11 DIAGNOSIS — N183 Chronic kidney disease, stage 3 (moderate): Secondary | ICD-10-CM | POA: Diagnosis not present

## 2018-05-11 DIAGNOSIS — C3490 Malignant neoplasm of unspecified part of unspecified bronchus or lung: Secondary | ICD-10-CM | POA: Diagnosis not present

## 2018-05-11 DIAGNOSIS — J449 Chronic obstructive pulmonary disease, unspecified: Secondary | ICD-10-CM | POA: Diagnosis not present

## 2018-05-11 DIAGNOSIS — D709 Neutropenia, unspecified: Secondary | ICD-10-CM | POA: Diagnosis not present

## 2018-05-11 DIAGNOSIS — F411 Generalized anxiety disorder: Secondary | ICD-10-CM | POA: Diagnosis not present

## 2018-05-12 ENCOUNTER — Inpatient Hospital Stay: Payer: Medicare Other

## 2018-05-12 ENCOUNTER — Other Ambulatory Visit: Payer: Medicare Other

## 2018-05-12 VITALS — BP 156/69 | HR 90 | Resp 18

## 2018-05-12 DIAGNOSIS — C3491 Malignant neoplasm of unspecified part of right bronchus or lung: Secondary | ICD-10-CM

## 2018-05-12 DIAGNOSIS — Z5112 Encounter for antineoplastic immunotherapy: Secondary | ICD-10-CM | POA: Diagnosis not present

## 2018-05-12 DIAGNOSIS — Z5111 Encounter for antineoplastic chemotherapy: Secondary | ICD-10-CM | POA: Diagnosis not present

## 2018-05-12 DIAGNOSIS — C3411 Malignant neoplasm of upper lobe, right bronchus or lung: Secondary | ICD-10-CM | POA: Diagnosis not present

## 2018-05-12 DIAGNOSIS — Z5189 Encounter for other specified aftercare: Secondary | ICD-10-CM | POA: Diagnosis not present

## 2018-05-12 MED ORDER — PEGFILGRASTIM-CBQV 6 MG/0.6ML ~~LOC~~ SOSY
6.0000 mg | PREFILLED_SYRINGE | Freq: Once | SUBCUTANEOUS | Status: AC
  Administered 2018-05-12: 6 mg via SUBCUTANEOUS

## 2018-05-12 MED ORDER — PEGFILGRASTIM-CBQV 6 MG/0.6ML ~~LOC~~ SOSY
PREFILLED_SYRINGE | SUBCUTANEOUS | Status: AC
  Filled 2018-05-12: qty 0.6

## 2018-05-12 NOTE — Patient Instructions (Signed)
Pegfilgrastim injection  What is this medicine?  PEGFILGRASTIM (PEG fil gra stim) is a long-acting granulocyte colony-stimulating factor that stimulates the growth of neutrophils, a type of white blood cell important in the body's fight against infection. It is used to reduce the incidence of fever and infection in patients with certain types of cancer who are receiving chemotherapy that affects the bone marrow, and to increase survival after being exposed to high doses of radiation.  This medicine may be used for other purposes; ask your health care provider or pharmacist if you have questions.  COMMON BRAND NAME(S): Fulphila, Neulasta, UDENYCA  What should I tell my health care provider before I take this medicine?  They need to know if you have any of these conditions:  -kidney disease  -latex allergy  -ongoing radiation therapy  -sickle cell disease  -skin reactions to acrylic adhesives (On-Body Injector only)  -an unusual or allergic reaction to pegfilgrastim, filgrastim, other medicines, foods, dyes, or preservatives  -pregnant or trying to get pregnant  -breast-feeding  How should I use this medicine?  This medicine is for injection under the skin. If you get this medicine at home, you will be taught how to prepare and give the pre-filled syringe or how to use the On-body Injector. Refer to the patient Instructions for Use for detailed instructions. Use exactly as directed. Tell your healthcare provider immediately if you suspect that the On-body Injector may not have performed as intended or if you suspect the use of the On-body Injector resulted in a missed or partial dose.  It is important that you put your used needles and syringes in a special sharps container. Do not put them in a trash can. If you do not have a sharps container, call your pharmacist or healthcare provider to get one.  Talk to your pediatrician regarding the use of this medicine in children. While this drug may be prescribed for  selected conditions, precautions do apply.  Overdosage: If you think you have taken too much of this medicine contact a poison control center or emergency room at once.  NOTE: This medicine is only for you. Do not share this medicine with others.  What if I miss a dose?  It is important not to miss your dose. Call your doctor or health care professional if you miss your dose. If you miss a dose due to an On-body Injector failure or leakage, a new dose should be administered as soon as possible using a single prefilled syringe for manual use.  What may interact with this medicine?  Interactions have not been studied.  Give your health care provider a list of all the medicines, herbs, non-prescription drugs, or dietary supplements you use. Also tell them if you smoke, drink alcohol, or use illegal drugs. Some items may interact with your medicine.  This list may not describe all possible interactions. Give your health care provider a list of all the medicines, herbs, non-prescription drugs, or dietary supplements you use. Also tell them if you smoke, drink alcohol, or use illegal drugs. Some items may interact with your medicine.  What should I watch for while using this medicine?  You may need blood work done while you are taking this medicine.  If you are going to need a MRI, CT scan, or other procedure, tell your doctor that you are using this medicine (On-Body Injector only).  What side effects may I notice from receiving this medicine?  Side effects that you should report to   your doctor or health care professional as soon as possible:  -allergic reactions like skin rash, itching or hives, swelling of the face, lips, or tongue  -back pain  -dizziness  -fever  -pain, redness, or irritation at site where injected  -pinpoint red spots on the skin  -red or dark-brown urine  -shortness of breath or breathing problems  -stomach or side pain, or pain at the shoulder  -swelling  -tiredness  -trouble passing urine or  change in the amount of urine  Side effects that usually do not require medical attention (report to your doctor or health care professional if they continue or are bothersome):  -bone pain  -muscle pain  This list may not describe all possible side effects. Call your doctor for medical advice about side effects. You may report side effects to FDA at 1-800-FDA-1088.  Where should I keep my medicine?  Keep out of the reach of children.  If you are using this medicine at home, you will be instructed on how to store it. Throw away any unused medicine after the expiration date on the label.  NOTE: This sheet is a summary. It may not cover all possible information. If you have questions about this medicine, talk to your doctor, pharmacist, or health care provider.   2019 Elsevier/Gold Standard (2017-06-01 16:57:08)

## 2018-05-14 ENCOUNTER — Telehealth: Payer: Self-pay | Admitting: Internal Medicine

## 2018-05-14 NOTE — Telephone Encounter (Signed)
MM PAL 3/24 moved appointment to 3/23 and injection from 3/26 to 3/25. Other appointment remain the same. No able to reach patient or leave voicemail. Schedule mailed. Patient will also get updated schedule at next visit.

## 2018-05-16 ENCOUNTER — Other Ambulatory Visit: Payer: Self-pay

## 2018-05-16 ENCOUNTER — Emergency Department (HOSPITAL_COMMUNITY): Payer: Medicare Other

## 2018-05-16 ENCOUNTER — Inpatient Hospital Stay (HOSPITAL_COMMUNITY): Payer: Medicare Other

## 2018-05-16 ENCOUNTER — Inpatient Hospital Stay (HOSPITAL_COMMUNITY)
Admission: EM | Admit: 2018-05-16 | Discharge: 2018-05-21 | DRG: 193 | Disposition: A | Discharge status: Skilled Nursing Facility | Payer: Medicare Other | Attending: Internal Medicine | Admitting: Internal Medicine

## 2018-05-16 ENCOUNTER — Encounter (HOSPITAL_COMMUNITY): Payer: Self-pay | Admitting: *Deleted

## 2018-05-16 DIAGNOSIS — E785 Hyperlipidemia, unspecified: Secondary | ICD-10-CM | POA: Diagnosis present

## 2018-05-16 DIAGNOSIS — Z515 Encounter for palliative care: Secondary | ICD-10-CM | POA: Diagnosis not present

## 2018-05-16 DIAGNOSIS — Y95 Nosocomial condition: Secondary | ICD-10-CM | POA: Diagnosis present

## 2018-05-16 DIAGNOSIS — D696 Thrombocytopenia, unspecified: Secondary | ICD-10-CM | POA: Diagnosis not present

## 2018-05-16 DIAGNOSIS — F29 Unspecified psychosis not due to a substance or known physiological condition: Secondary | ICD-10-CM | POA: Diagnosis not present

## 2018-05-16 DIAGNOSIS — R52 Pain, unspecified: Secondary | ICD-10-CM | POA: Diagnosis not present

## 2018-05-16 DIAGNOSIS — Z66 Do not resuscitate: Secondary | ICD-10-CM | POA: Diagnosis present

## 2018-05-16 DIAGNOSIS — I252 Old myocardial infarction: Secondary | ICD-10-CM | POA: Diagnosis not present

## 2018-05-16 DIAGNOSIS — D61810 Antineoplastic chemotherapy induced pancytopenia: Secondary | ICD-10-CM | POA: Diagnosis present

## 2018-05-16 DIAGNOSIS — I10 Essential (primary) hypertension: Secondary | ICD-10-CM | POA: Diagnosis present

## 2018-05-16 DIAGNOSIS — J410 Simple chronic bronchitis: Secondary | ICD-10-CM | POA: Diagnosis not present

## 2018-05-16 DIAGNOSIS — J440 Chronic obstructive pulmonary disease with acute lower respiratory infection: Secondary | ICD-10-CM | POA: Diagnosis present

## 2018-05-16 DIAGNOSIS — F411 Generalized anxiety disorder: Secondary | ICD-10-CM | POA: Diagnosis not present

## 2018-05-16 DIAGNOSIS — Z87891 Personal history of nicotine dependence: Secondary | ICD-10-CM | POA: Diagnosis not present

## 2018-05-16 DIAGNOSIS — E860 Dehydration: Secondary | ICD-10-CM | POA: Diagnosis not present

## 2018-05-16 DIAGNOSIS — I70219 Atherosclerosis of native arteries of extremities with intermittent claudication, unspecified extremity: Secondary | ICD-10-CM | POA: Diagnosis not present

## 2018-05-16 DIAGNOSIS — R739 Hyperglycemia, unspecified: Secondary | ICD-10-CM

## 2018-05-16 DIAGNOSIS — M2550 Pain in unspecified joint: Secondary | ICD-10-CM | POA: Diagnosis not present

## 2018-05-16 DIAGNOSIS — C3411 Malignant neoplasm of upper lobe, right bronchus or lung: Secondary | ICD-10-CM | POA: Diagnosis not present

## 2018-05-16 DIAGNOSIS — R000 Tachycardia, unspecified: Secondary | ICD-10-CM | POA: Diagnosis not present

## 2018-05-16 DIAGNOSIS — C3490 Malignant neoplasm of unspecified part of unspecified bronchus or lung: Secondary | ICD-10-CM | POA: Diagnosis present

## 2018-05-16 DIAGNOSIS — I129 Hypertensive chronic kidney disease with stage 1 through stage 4 chronic kidney disease, or unspecified chronic kidney disease: Secondary | ICD-10-CM | POA: Diagnosis present

## 2018-05-16 DIAGNOSIS — I701 Atherosclerosis of renal artery: Secondary | ICD-10-CM | POA: Diagnosis present

## 2018-05-16 DIAGNOSIS — Z7902 Long term (current) use of antithrombotics/antiplatelets: Secondary | ICD-10-CM

## 2018-05-16 DIAGNOSIS — K449 Diaphragmatic hernia without obstruction or gangrene: Secondary | ICD-10-CM | POA: Diagnosis present

## 2018-05-16 DIAGNOSIS — J181 Lobar pneumonia, unspecified organism: Secondary | ICD-10-CM | POA: Diagnosis not present

## 2018-05-16 DIAGNOSIS — D701 Agranulocytosis secondary to cancer chemotherapy: Secondary | ICD-10-CM

## 2018-05-16 DIAGNOSIS — M6281 Muscle weakness (generalized): Secondary | ICD-10-CM | POA: Diagnosis not present

## 2018-05-16 DIAGNOSIS — D649 Anemia, unspecified: Secondary | ICD-10-CM | POA: Diagnosis not present

## 2018-05-16 DIAGNOSIS — I219 Acute myocardial infarction, unspecified: Secondary | ICD-10-CM | POA: Diagnosis not present

## 2018-05-16 DIAGNOSIS — B9729 Other coronavirus as the cause of diseases classified elsewhere: Secondary | ICD-10-CM | POA: Diagnosis present

## 2018-05-16 DIAGNOSIS — I499 Cardiac arrhythmia, unspecified: Secondary | ICD-10-CM | POA: Diagnosis not present

## 2018-05-16 DIAGNOSIS — J441 Chronic obstructive pulmonary disease with (acute) exacerbation: Secondary | ICD-10-CM | POA: Diagnosis present

## 2018-05-16 DIAGNOSIS — K219 Gastro-esophageal reflux disease without esophagitis: Secondary | ICD-10-CM | POA: Diagnosis present

## 2018-05-16 DIAGNOSIS — Z7401 Bed confinement status: Secondary | ICD-10-CM | POA: Diagnosis not present

## 2018-05-16 DIAGNOSIS — D7589 Other specified diseases of blood and blood-forming organs: Secondary | ICD-10-CM | POA: Diagnosis present

## 2018-05-16 DIAGNOSIS — E119 Type 2 diabetes mellitus without complications: Secondary | ICD-10-CM

## 2018-05-16 DIAGNOSIS — I739 Peripheral vascular disease, unspecified: Secondary | ICD-10-CM | POA: Diagnosis not present

## 2018-05-16 DIAGNOSIS — E1122 Type 2 diabetes mellitus with diabetic chronic kidney disease: Secondary | ICD-10-CM | POA: Diagnosis present

## 2018-05-16 DIAGNOSIS — B342 Coronavirus infection, unspecified: Secondary | ICD-10-CM | POA: Diagnosis not present

## 2018-05-16 DIAGNOSIS — E1151 Type 2 diabetes mellitus with diabetic peripheral angiopathy without gangrene: Secondary | ICD-10-CM | POA: Diagnosis present

## 2018-05-16 DIAGNOSIS — T451X5A Adverse effect of antineoplastic and immunosuppressive drugs, initial encounter: Secondary | ICD-10-CM | POA: Diagnosis present

## 2018-05-16 DIAGNOSIS — J449 Chronic obstructive pulmonary disease, unspecified: Secondary | ICD-10-CM | POA: Diagnosis not present

## 2018-05-16 DIAGNOSIS — R0602 Shortness of breath: Secondary | ICD-10-CM | POA: Diagnosis not present

## 2018-05-16 DIAGNOSIS — C7951 Secondary malignant neoplasm of bone: Secondary | ICD-10-CM | POA: Diagnosis present

## 2018-05-16 DIAGNOSIS — R531 Weakness: Secondary | ICD-10-CM | POA: Diagnosis not present

## 2018-05-16 DIAGNOSIS — I4891 Unspecified atrial fibrillation: Secondary | ICD-10-CM | POA: Diagnosis present

## 2018-05-16 DIAGNOSIS — Z8673 Personal history of transient ischemic attack (TIA), and cerebral infarction without residual deficits: Secondary | ICD-10-CM

## 2018-05-16 DIAGNOSIS — Z7189 Other specified counseling: Secondary | ICD-10-CM | POA: Diagnosis not present

## 2018-05-16 DIAGNOSIS — Z800 Family history of malignant neoplasm of digestive organs: Secondary | ICD-10-CM | POA: Diagnosis not present

## 2018-05-16 DIAGNOSIS — Z8249 Family history of ischemic heart disease and other diseases of the circulatory system: Secondary | ICD-10-CM

## 2018-05-16 DIAGNOSIS — I4811 Longstanding persistent atrial fibrillation: Secondary | ICD-10-CM | POA: Diagnosis not present

## 2018-05-16 DIAGNOSIS — N183 Chronic kidney disease, stage 3 unspecified: Secondary | ICD-10-CM | POA: Diagnosis present

## 2018-05-16 DIAGNOSIS — J1289 Other viral pneumonia: Principal | ICD-10-CM | POA: Diagnosis present

## 2018-05-16 DIAGNOSIS — Z880 Allergy status to penicillin: Secondary | ICD-10-CM | POA: Diagnosis not present

## 2018-05-16 DIAGNOSIS — I2510 Atherosclerotic heart disease of native coronary artery without angina pectoris: Secondary | ICD-10-CM | POA: Diagnosis present

## 2018-05-16 DIAGNOSIS — E1159 Type 2 diabetes mellitus with other circulatory complications: Secondary | ICD-10-CM | POA: Diagnosis not present

## 2018-05-16 DIAGNOSIS — R05 Cough: Secondary | ICD-10-CM | POA: Diagnosis not present

## 2018-05-16 DIAGNOSIS — D709 Neutropenia, unspecified: Secondary | ICD-10-CM | POA: Diagnosis present

## 2018-05-16 DIAGNOSIS — D61818 Other pancytopenia: Secondary | ICD-10-CM | POA: Diagnosis not present

## 2018-05-16 DIAGNOSIS — I6930 Unspecified sequelae of cerebral infarction: Secondary | ICD-10-CM | POA: Diagnosis not present

## 2018-05-16 DIAGNOSIS — A419 Sepsis, unspecified organism: Secondary | ICD-10-CM

## 2018-05-16 DIAGNOSIS — J189 Pneumonia, unspecified organism: Secondary | ICD-10-CM | POA: Diagnosis present

## 2018-05-16 DIAGNOSIS — J9601 Acute respiratory failure with hypoxia: Secondary | ICD-10-CM | POA: Diagnosis not present

## 2018-05-16 DIAGNOSIS — E1165 Type 2 diabetes mellitus with hyperglycemia: Secondary | ICD-10-CM | POA: Diagnosis present

## 2018-05-16 HISTORY — DX: Malignant (primary) neoplasm, unspecified: C80.1

## 2018-05-16 LAB — PROTIME-INR
INR: 1.2 (ref 0.8–1.2)
INR: 1.2 (ref 0.8–1.2)
Prothrombin Time: 14.9 seconds (ref 11.4–15.2)
Prothrombin Time: 15.4 seconds — ABNORMAL HIGH (ref 11.4–15.2)

## 2018-05-16 LAB — COMPREHENSIVE METABOLIC PANEL
ALT: 13 U/L (ref 0–44)
AST: 14 U/L — ABNORMAL LOW (ref 15–41)
Albumin: 2.6 g/dL — ABNORMAL LOW (ref 3.5–5.0)
Alkaline Phosphatase: 83 U/L (ref 38–126)
Anion gap: 8 (ref 5–15)
BUN: 30 mg/dL — ABNORMAL HIGH (ref 8–23)
CALCIUM: 8.2 mg/dL — AB (ref 8.9–10.3)
CO2: 24 mmol/L (ref 22–32)
Chloride: 102 mmol/L (ref 98–111)
Creatinine, Ser: 1.15 mg/dL (ref 0.61–1.24)
GFR calc non Af Amer: 58 mL/min — ABNORMAL LOW (ref >60)
Glucose, Bld: 274 mg/dL — ABNORMAL HIGH (ref 70–99)
Potassium: 4.2 mmol/L (ref 3.5–5.1)
Sodium: 134 mmol/L — ABNORMAL LOW (ref 135–145)
Total Bilirubin: 1 mg/dL (ref 0.3–1.2)
Total Protein: 5.5 g/dL — ABNORMAL LOW (ref 6.5–8.1)

## 2018-05-16 LAB — INFLUENZA PANEL BY PCR (TYPE A & B)
Influenza A By PCR: NEGATIVE
Influenza B By PCR: NEGATIVE

## 2018-05-16 LAB — CBC WITH DIFFERENTIAL/PLATELET
HCT: 32.6 % — ABNORMAL LOW (ref 39.0–52.0)
Hemoglobin: 10.1 g/dL — ABNORMAL LOW (ref 13.0–17.0)
MCH: 31.8 pg (ref 26.0–34.0)
MCHC: 31 g/dL (ref 30.0–36.0)
MCV: 102.5 fL — ABNORMAL HIGH (ref 80.0–100.0)
Platelets: 52 10*3/uL — ABNORMAL LOW (ref 150–400)
RBC: 3.18 MIL/uL — ABNORMAL LOW (ref 4.22–5.81)
RDW: 14.1 % (ref 11.5–15.5)
WBC: 0.3 10*3/uL — CL (ref 4.0–10.5)
nRBC: 0 % (ref 0.0–0.2)

## 2018-05-16 LAB — URINALYSIS, ROUTINE W REFLEX MICROSCOPIC
Bacteria, UA: NONE SEEN
Bilirubin Urine: NEGATIVE
Glucose, UA: >=500 mg/dL — AB
Hgb urine dipstick: NEGATIVE
Ketones, ur: NEGATIVE mg/dL
Leukocytes,Ua: NEGATIVE
Nitrite: NEGATIVE
Protein, ur: 30 mg/dL — AB
Specific Gravity, Urine: 1.014 (ref 1.005–1.030)
pH: 5 (ref 5.0–8.0)

## 2018-05-16 LAB — APTT: aPTT: 29 seconds (ref 24–36)

## 2018-05-16 LAB — LACTIC ACID, PLASMA
LACTIC ACID, VENOUS: 4.6 mmol/L — AB (ref 0.5–1.9)
LACTIC ACID, VENOUS: 4.7 mmol/L — AB (ref 0.5–1.9)
Lactic Acid, Venous: 3.1 mmol/L (ref 0.5–1.9)
Lactic Acid, Venous: 3 mmol/L (ref 0.5–1.9)

## 2018-05-16 LAB — CREATININE, SERUM
Creatinine, Ser: 1.09 mg/dL (ref 0.61–1.24)
GFR calc Af Amer: >60 mL/min (ref >60)
GFR calc non Af Amer: >60 mL/min (ref >60)

## 2018-05-16 MED ORDER — TRAZODONE HCL 50 MG PO TABS
25.0000 mg | ORAL_TABLET | Freq: Every day | ORAL | Status: DC
  Administered 2018-05-16 – 2018-05-20 (×5): 25 mg via ORAL
  Filled 2018-05-16 (×5): qty 1

## 2018-05-16 MED ORDER — POLYETHYLENE GLYCOL 3350 17 G PO PACK
17.0000 g | PACK | Freq: Every day | ORAL | Status: DC | PRN
  Administered 2018-05-21: 17 g via ORAL
  Filled 2018-05-16: qty 1

## 2018-05-16 MED ORDER — ONDANSETRON 4 MG PO TBDP
4.0000 mg | ORAL_TABLET | Freq: Three times a day (TID) | ORAL | Status: DC | PRN
  Administered 2018-05-17: 4 mg via ORAL
  Filled 2018-05-16: qty 1

## 2018-05-16 MED ORDER — LEVOFLOXACIN IN D5W 750 MG/150ML IV SOLN
750.0000 mg | Freq: Once | INTRAVENOUS | Status: AC
  Administered 2018-05-16: 750 mg via INTRAVENOUS
  Filled 2018-05-16: qty 150

## 2018-05-16 MED ORDER — METOPROLOL SUCCINATE ER 25 MG PO TB24
25.0000 mg | ORAL_TABLET | Freq: Every day | ORAL | Status: DC
  Administered 2018-05-17 – 2018-05-21 (×5): 25 mg via ORAL
  Filled 2018-05-16 (×5): qty 1

## 2018-05-16 MED ORDER — SODIUM CHLORIDE 0.9 % IV SOLN
INTRAVENOUS | Status: DC
  Administered 2018-05-16 – 2018-05-18 (×4): via INTRAVENOUS

## 2018-05-16 MED ORDER — SODIUM CHLORIDE 0.9 % IV BOLUS (SEPSIS)
1000.0000 mL | Freq: Once | INTRAVENOUS | Status: AC
  Administered 2018-05-16: 1000 mL via INTRAVENOUS

## 2018-05-16 MED ORDER — HEPARIN SODIUM (PORCINE) 5000 UNIT/ML IJ SOLN
5000.0000 [IU] | Freq: Three times a day (TID) | INTRAMUSCULAR | Status: DC
  Administered 2018-05-16 – 2018-05-17 (×2): 5000 [IU] via SUBCUTANEOUS
  Filled 2018-05-16 (×2): qty 1

## 2018-05-16 MED ORDER — VANCOMYCIN HCL IN DEXTROSE 750-5 MG/150ML-% IV SOLN
750.0000 mg | INTRAVENOUS | Status: DC
  Filled 2018-05-16: qty 150

## 2018-05-16 MED ORDER — IPRATROPIUM-ALBUTEROL 0.5-2.5 (3) MG/3ML IN SOLN
3.0000 mL | Freq: Once | RESPIRATORY_TRACT | Status: AC
  Administered 2018-05-16: 3 mL via RESPIRATORY_TRACT
  Filled 2018-05-16: qty 3

## 2018-05-16 MED ORDER — ALUM & MAG HYDROXIDE-SIMETH 200-200-20 MG/5ML PO SUSP
30.0000 mL | ORAL | Status: DC | PRN
  Administered 2018-05-16: 30 mL via ORAL
  Filled 2018-05-16: qty 30

## 2018-05-16 MED ORDER — LOPERAMIDE HCL 2 MG PO CAPS: 2.0000 mg | ORAL_CAPSULE | Freq: Three times a day (TID) | ORAL | Status: DC | PRN

## 2018-05-16 MED ORDER — MOMETASONE FURO-FORMOTEROL FUM 200-5 MCG/ACT IN AERO
2.0000 | INHALATION_SPRAY | Freq: Two times a day (BID) | RESPIRATORY_TRACT | Status: DC
  Administered 2018-05-17 – 2018-05-21 (×9): 2 via RESPIRATORY_TRACT
  Filled 2018-05-16 (×3): qty 8.8

## 2018-05-16 MED ORDER — FAMOTIDINE 20 MG PO TABS
20.0000 mg | ORAL_TABLET | Freq: Every day | ORAL | Status: DC
  Administered 2018-05-17 – 2018-05-21 (×5): 20 mg via ORAL
  Filled 2018-05-16 (×5): qty 1

## 2018-05-16 MED ORDER — VANCOMYCIN HCL IN DEXTROSE 1-5 GM/200ML-% IV SOLN
1000.0000 mg | Freq: Once | INTRAVENOUS | Status: AC
  Administered 2018-05-16: 1000 mg via INTRAVENOUS
  Filled 2018-05-16: qty 200

## 2018-05-16 MED ORDER — ALPRAZOLAM 0.5 MG PO TABS
0.5000 mg | ORAL_TABLET | Freq: Every day | ORAL | Status: DC
  Administered 2018-05-16 – 2018-05-20 (×5): 0.5 mg via ORAL
  Filled 2018-05-16 (×5): qty 1

## 2018-05-16 MED ORDER — HYDROCODONE-ACETAMINOPHEN 5-325 MG PO TABS
1.0000 | ORAL_TABLET | Freq: Four times a day (QID) | ORAL | Status: DC | PRN
  Administered 2018-05-18: 1 via ORAL
  Filled 2018-05-16: qty 1

## 2018-05-16 MED ORDER — SODIUM CHLORIDE 0.9 % IV SOLN
2.0000 g | Freq: Three times a day (TID) | INTRAVENOUS | Status: DC
  Administered 2018-05-16 – 2018-05-18 (×5): 2 g via INTRAVENOUS
  Filled 2018-05-16 (×6): qty 2

## 2018-05-16 MED ORDER — PREDNISONE 5 MG PO TABS
10.0000 mg | ORAL_TABLET | Freq: Every day | ORAL | Status: DC
  Administered 2018-05-17 – 2018-05-21 (×5): 10 mg via ORAL
  Filled 2018-05-16 (×5): qty 2

## 2018-05-16 MED ORDER — SODIUM CHLORIDE 0.9% FLUSH: 3.0000 mL | Freq: Once | INTRAVENOUS | Status: DC

## 2018-05-16 MED ORDER — ATORVASTATIN CALCIUM 20 MG PO TABS
20.0000 mg | ORAL_TABLET | Freq: Every day | ORAL | Status: DC
  Administered 2018-05-17 – 2018-05-21 (×5): 20 mg via ORAL
  Filled 2018-05-16 (×5): qty 1

## 2018-05-16 MED ORDER — NITROGLYCERIN 0.4 MG SL SUBL: 0.4000 mg | SUBLINGUAL_TABLET | SUBLINGUAL | Status: DC | PRN

## 2018-05-16 MED ORDER — CLOPIDOGREL BISULFATE 75 MG PO TABS
75.0000 mg | ORAL_TABLET | Freq: Every day | ORAL | Status: DC
  Administered 2018-05-17: 75 mg via ORAL
  Filled 2018-05-16: qty 1

## 2018-05-16 NOTE — H&P (Signed)
Martin [name] is an 85 y.o. male.   Chief Complaint: Shortness of breath HPI: The patient is an 85 yr old man who states that he had felt weak and short of breath for about a week. He states that last night he became so short of breath that he couldn't walk. He denies knowledge of fevers, although he had a temperature when he arrived at the ED. He states that he is coughing up sputum. He denies new chest pain. He has chest pain at baseline due to his known NSCLCA.  The patient carries a past medical history significant for NSCLCA for which he is receiving chemotherapy. He also has CKD, COPD, DM II, GERD, Hiatal hernia, hyperlipidemia, hypertension, CAD with history of MI x 2, PVD, history of stroke and renal artery stenosis.  Upon arrival in the ED the patient had a temperature of 98.9. Respiratory rate was 18. Heart rate was 91. Blood pressure was 136/66. He is saturating 100% on 2 Liters.  Sodium was 134. Potassium was 3.4. CO2 24. LFT's were unremarkable. Lactic acid was elevated at 3.1. WBC was 3.1. Neutrophils were too few to count. Hemoglobin was 10.1.  Past Medical History:  Diagnosis Date  . Arthritis   . Cancer ([place])   . CKD (chronic kidney disease) stage 3, GFR 30-59 ml/min (HCC)   . COPD (chronic obstructive pulmonary disease) ([place])   . Diabetes mellitus    denies, reports resolved with diet  . GERD (gastroesophageal reflux disease)   . H/O: GI bleed   . Hiatal hernia   . Hyperlipidemia   . Hypertension   . Myocardial infarction [facility])    1978 & 2011  . Peripheral vascular disease, unspecified ([place])   . Renal artery stenosis ([place])   . Stroke [facility])     Past Surgical History:  Procedure Laterality Date  . Aortobifemoral BPG  07/22/10   and Right femoral-popliteal BPG    Family History  Problem Relation Age of Onset  . Cancer Father 12       LIVER CANCER  . Dementia Brother 65  . CAD Brother    Social History:  reports that he quit smoking about 8 years ago. His  smoking use included cigarettes. He has a 65.00 pack-year smoking history. He has never used smokeless tobacco. He reports that he does not drink alcohol or use drugs. (Not in a hospital admission)   Allergies:  Allergies  Allergen Reactions  . Penicillins Rash    DID THE REACTION INVOLVE: Swelling of the face/tongue/throat, SOB, or low BP? Yes Sudden or severe rash/hives, skin peeling, or the inside of the mouth or nose? No Did it require medical treatment? Was already in hosp When did it last happen?40 years ago If all above answers are "NO", may proceed with cephalosporin use.     Pertinent items are noted in HPI.   General appearance: alert, cooperative and no distress Head: Normocephalic, without obvious abnormality, atraumatic Eyes: conjunctivae/corneas clear. PERRL, EOM's intact. Fundi benign. Throat: lips, mucosa, and tongue normal; teeth and gums normal Neck: no adenopathy, no carotid bruit, no JVD, supple, symmetrical, trachea midline and thyroid not enlarged, symmetric, no tenderness/mass/nodules Resp: Diminished breath sounds bilaterally. No wheezes, rales, or rhonchi are appreciated. Chest wall: no tenderness Cardio: regular rate and rhythm, S1, S2 normal, no murmur, click, rub or gallop GI: soft, non-tender; bowel sounds normal; no masses,  no organomegaly Extremities: extremities normal, atraumatic, no cyanosis or edema Pulses: 2+ and symmetric Skin: Skin color,  texture, turgor normal. No rashes or lesions Lymph nodes: Cervical, supraclavicular, and axillary nodes normal. Neurologic: Grossly normal   Results for orders placed or performed during the hospital encounter of 05/16/18 (from the past 48 hour(s))  Comprehensive metabolic panel     Status: Abnormal   Collection Time: 05/16/18 10:49 AM  Result Value Ref Range   Sodium 134 (L) 135 - 145 mmol/L   Potassium 4.2 3.5 - 5.1 mmol/L   Chloride 102 98 - 111 mmol/L   CO2 24 22 - 32 mmol/L   Glucose, Bld 274 (H)  70 - 99 mg/dL   BUN 30 (H) 8 - 23 mg/dL   Creatinine, Ser 1.15 0.61 - 1.24 mg/dL   Calcium 8.2 (L) 8.9 - 10.3 mg/dL   Total Protein 5.5 (L) 6.5 - 8.1 g/dL   Albumin 2.6 (L) 3.5 - 5.0 g/dL   AST 14 (L) 15 - 41 U/L   ALT 13 0 - 44 U/L   Alkaline Phosphatase 83 38 - 126 U/L   Total Bilirubin 1.0 0.3 - 1.2 mg/dL   GFR calc non Af Amer 58 (L) >60 mL/min   GFR calc Af Amer >60 >60 mL/min   Anion gap 8 5 - 15    Comment: Performed at [facility], [place] [address], [place], [place] [address]  Lactic acid, plasma     Status: Abnormal   Collection Time: 05/16/18 10:49 AM  Result Value Ref Range   Lactic Acid, Venous 3.1 (HH) 0.5 - 1.9 mmol/L    Comment: CRITICAL RESULT CALLED TO, READ BACK BY AND VERIFIED WITH: HALL,C. RN AT 1146 05/16/18 MULLINS,T Performed at [facility], [place] [address], [place], [place] [address]   CBC with Differential     Status: Abnormal   Collection Time: 05/16/18 10:49 AM  Result Value Ref Range   WBC 0.3 (LL) 4.0 - 10.5 K/uL    Comment: This critical result has verified and been called to [facility] by Karin Golden on 03 08 2020 at 1156, and has been read back. CRITICAL RESULTS VERIFIED   RBC 3.18 (L) 4.22 - 5.81 MIL/uL   Hemoglobin 10.1 (L) 13.0 - 17.0 g/dL   HCT 32.6 (L) 39.0 - 52.0 %   MCV 102.5 (H) 80.0 - 100.0 fL   MCH 31.8 26.0 - 34.0 pg   MCHC 31.0 30.0 - 36.0 g/dL   RDW 14.1 11.5 - 15.5 %   Platelets 52 (L) 150 - 400 K/uL    Comment: Immature Platelet Fraction may be clinically indicated, consider ordering this additional test [id number]    nRBC 0.0 0.0 - 0.2 %   Neutrophils Relative % TOO FEW TO COUNT, SMEAR AVAILABLE FOR REVIEW %   Neutro Abs TOO FEW TO COUNT, SMEAR AVAILABLE FOR REVIEW 1.7 - 7.7 K/uL   Lymphocytes Relative TOO FEW TO COUNT, SMEAR AVAILABLE FOR REVIEW %   Lymphs Abs TOO FEW TO COUNT, SMEAR AVAILABLE FOR REVIEW 0.7 - 4.0 K/uL   Monocytes Relative TOO FEW TO COUNT, SMEAR AVAILABLE FOR REVIEW %    Monocytes Absolute TOO FEW TO COUNT, SMEAR AVAILABLE FOR REVIEW 0.1 - 1.0 K/uL   Eosinophils Relative TOO FEW TO COUNT, SMEAR AVAILABLE FOR REVIEW %   Eosinophils Absolute TOO FEW TO COUNT, SMEAR AVAILABLE FOR REVIEW 0.0 - 0.5 K/uL   Basophils Relative TOO FEW TO COUNT, SMEAR AVAILABLE FOR REVIEW %   Basophils Absolute TOO FEW TO COUNT, SMEAR AVAILABLE FOR REVIEW 0.0 - 0.1 K/uL  Comment: Performed at [facility], [place] [address] [place], [place] [address]  Protime-INR     Status: None   Collection Time: 05/16/18 10:49 AM  Result Value Ref Range   Prothrombin Time 14.9 11.4 - 15.2 seconds   INR 1.2 0.8 - 1.2    Comment: (NOTE) INR goal varies based on device and disease states. Performed at [facility], [place] [address] [place], [place] [address]   Influenza panel by PCR (type A & B)     Status: None   Collection Time: 05/16/18 11:08 AM  Result Value Ref Range   Influenza A By PCR NEGATIVE NEGATIVE   Influenza B By PCR NEGATIVE NEGATIVE    Comment: (NOTE) The Xpert Xpress Flu assay is intended as an aid in the diagnosis of  influenza and should not be used as a sole basis for treatment.  This  assay is FDA approved for nasopharyngeal swab specimens only. Nasal  washings and aspirates are unacceptable for Xpert Xpress Flu testing. Performed at [facility], [place] [address], [place], [place] [address]    @[email or web address] @  Blood pressure (!) 108/51, pulse 82, temperature 98.3 F (36.8 C), temperature source Oral, resp. rate 17, height 5\' 6"  (1.676 m), weight 62 kg, SpO2 100 %.    Assessment/Plan Problem  Pneumonia  Neutropenia (Hcc)  Bone Metastasis (Hcc)  Metastatic Lung Cancer (Metastasis From Lung to Other Site) (Hcc)  Essential Hypertension  Anxiety State  Coronary Artery Disease Involving Native Coronary Artery of Native Heart Without Angina Pectoris  Chronic Obstructive Pulmonary Disease (Hcc)  CKD (chronic kidney  disease) stage 3, GFR 30-59 ml/min (HCC)  Diabetes Mellitus, Type 2 (Hcc)  Atrial Fibrillation (Hcc)   The patient is being admitted to a medical bed. He is being treated for hospital acquired pneumonia due to his inpatient stay in mid February. He is on neutropenic precautions. He will be continued on his home medications. His lactic acid was 3.1 in the ED. We will follow that and utilize the sepsis protocol as possible. He will be tested for respiratory viruses. He is negative for flu. He will also be tested for legionella and sputum cultures. Blood cultures have also been collected and are pending.  I have confirmed with the patient that he still wishes to be a DNR.  I have seen and examined this patient myself. I have spent 70 minutes in his evaluation and care.   Skylee Baird 05/16/2018, 2:09 PM

## 2018-05-16 NOTE — ED Triage Notes (Signed)
EMS reports CA of lungs, increased SHOB with flank pain on deep breaths, Primary site rt lung ? Sub q air on ft side. Pt receives chemo here March 4. 101.9 fever treated with Tylenol. IV 18 L ac with 50 mcg Fentanyl for pain, PT IS FULL CODE.  Vs 101.9-106-140/60- 22 94  On room air 2 L [place] sats 97%. Pt is alert and oriented.

## 2018-05-16 NOTE — Progress Notes (Signed)
Pharmacy Antibiotic Note  Martin [name] is a 85 y.o. male with metastatic lung cancer admitted on chemotherapy (last txment received on 3/2) presented to the ED on  05/16/2018 with c/o SOB, flank pain, fever and cough. CXR on 3/8 showed "new right basilar patchy airspace process compatible with pneumonia or aspiration."  To start vancomycin and aztreonam for PNA.  -scr 1.15 (crcl~41) - wbc 0.3   Plan: - vancomycin 1000 mg IV x1 ordered in the ED, then 750 mg IV q24h (est AUC 435) - aztreonam 2gm IV q8h - levaquin 750 mg dose given in the ED at noon on 3/8  __________________________________________  Height: 5\' 6"  (167.6 cm) Weight: 136 lb 11 oz (62 kg) IBW/kg (Calculated) : 63.8  Temp (24hrs), Avg:98.9 F (37.2 C), Min:98.9 F (37.2 C), Max:98.9 F (37.2 C)  Recent Labs  Lab 05/10/18 0725 05/16/18 1049  WBC 10.7* 0.3*  CREATININE 1.12 1.15  LATICACIDVEN  --  3.1*    Estimated Creatinine Clearance: 41.2 mL/min (by C-G formula based on SCr of 1.15 mg/dL).    Allergies  Allergen Reactions  . Penicillins Rash    DID THE REACTION INVOLVE: Swelling of the face/tongue/throat, SOB, or low BP? Yes Sudden or severe rash/hives, skin peeling, or the inside of the mouth or nose? No Did it require medical treatment? Was already in hosp When did it last happen?40 years ago If all above answers are "NO", may proceed with cephalosporin use.      Thank you for allowing pharmacy to be a part of this patient's care.  Lynelle Doctor 05/16/2018 1:16 PM

## 2018-05-16 NOTE — ED Notes (Signed)
Pt has requested that nurse call his sister Lucita Lora [phone] and update her on his condition. Ms Rosana Berger has been contacted and updated as pt requested.

## 2018-05-16 NOTE — ED Notes (Signed)
Date and time results received: 05/16/18 11:47 AM  (use smartphrase ".now" to insert current time)  Test: Lactic Acid  Critical Value: 3.1  Name of Provider Notified: Patty Primary RN   Orders Received? Or Actions Taken?:

## 2018-05-16 NOTE — Progress Notes (Signed)
A consult was received from an ED physician for vancomycin and levaquin per pharmacy dosing.  The patient's profile has been reviewed for ht/wt/allergies/indication/available labs.     A one time order has been placed for vancomycin 1000 mg IV x1 and levaquin 750 mg IV x1.  Further antibiotics/pharmacy consults should be ordered by admitting physician if indicated.                       Thank you, Lynelle Doctor 05/16/2018  11:53 AM

## 2018-05-16 NOTE — ED Notes (Signed)
Date and time results received: 05/16/18 1158 (use smartphrase ".now" to insert current time)  Test: WBC Critical Value: 0.3  Name of Provider Notified: Patty, RN  Orders Received? Or Actions Taken?: Orders Received - See Orders for details, Actions Taken: Notified Primary RN and Notified Primary RN

## 2018-05-16 NOTE — Progress Notes (Signed)
CRITICAL VALUE ALERT  Critical Value: Lactic Acid 4.7 Date & Time Notied: 7412 05/16/2018  Provider Notified: Yes  Orders Received/Actions taken: None since paged

## 2018-05-16 NOTE — ED Provider Notes (Signed)
[facility] DEPT Provider Note   CSN: [id number] Arrival date & time: 05/16/18  1034    History   Chief Complaint Chief Complaint  Patient presents with  . Shortness of Breath    HPI Martin [name] is a 85 y.o. male.     The history is provided by the patient.  Shortness of Breath  Severity:  Severe Onset quality:  Gradual Duration:  1 day Timing:  Constant Progression:  Worsening Chronicity:  New Relieved by:  Nothing Worsened by:  Nothing Associated symptoms: chest pain, cough and fever   Associated symptoms: no vomiting   Patient with history of non-small cell lung CA, COPD presents with increasing cough and shortness of breath.  He reports it became very severe over the past day.  He reports right-sided chest pain with cough.  There is also fevers. Patient is undergoing chemotherapy.  Past Medical History:  Diagnosis Date  . Arthritis   . Cancer ([place])   . CKD (chronic kidney disease) stage 3, GFR 30-59 ml/min (HCC)   . COPD (chronic obstructive pulmonary disease) ([place])   . Diabetes mellitus    denies, reports resolved with diet  . GERD (gastroesophageal reflux disease)   . H/O: GI bleed   . Hiatal hernia   . Hyperlipidemia   . Hypertension   . Myocardial infarction [facility])    1978 & 2011  . Peripheral vascular disease, unspecified ([place])   . Renal artery stenosis ([place])   . Stroke [facility])     Patient Active Problem List   Diagnosis Date Noted  . Dehydration   . Diarrhea 04/20/2018  . Neutropenia ([place]) 04/20/2018  . Goals of care, counseling/discussion 04/06/2018  . Encounter for antineoplastic chemotherapy 04/06/2018  . Encounter for antineoplastic immunotherapy 04/06/2018  . Bone metastasis ([place]) 03/18/2018  . Metastatic lung cancer (metastasis from lung to other site) ([place]) 03/16/2018  . Insomnia 10/05/2015  . Late effect of stroke 10/05/2015  . Slow transit constipation   . Nausea without vomiting   . Essential  hypertension   . Anxiety state   . Cerebellar infarct ([place]) 07/17/2015  . Cerebellar stroke ([place])   . Coronary artery disease involving native coronary artery of native heart without angina pectoris   . Chronic obstructive pulmonary disease ([place])   . CKD (chronic kidney disease) stage 3, GFR 30-59 ml/min (HCC)   . Leukocytosis   . Thrombocytopenia ([place])   . Cerebrovascular accident (CVA) ([place])   . Stroke ([name]) 07/15/2015  . CVA (cerebral infarction) 07/15/2015  . Hypothermia 07/15/2015  . Atherosclerotic PVD with intermittent claudication ([place]) 12/29/2013  . Aftercare following surgery of the circulatory system, [place] 12/23/2012  . Atherosclerosis of native arteries of the extremities with intermittent claudication 06/17/2012  . Diabetes mellitus, type 2 ([place]) 06/11/2012  . Peripheral vascular disease, unspecified ([place]) 05/29/2011  . HLD (hyperlipidemia) 08/14/2009  . MYOCARDIAL INFARCTION 08/14/2009  . ATRIAL FIBRILLATION 08/14/2009  . TOBACCO ABUSE, HX OF 08/14/2009    Past Surgical History:  Procedure Laterality Date  . Aortobifemoral BPG  07/22/10   and Right femoral-popliteal BPG        Home Medications    Prior to Admission medications   Medication Sig Start Date End Date Taking? Authorizing Provider  ALPRAZolam Duanne Moron) 0.5 MG tablet TAKE 1 TABLET BY MOUTH AT BEDTIME 04/05/18   Susy Frizzle, MD  atorvastatin (LIPITOR) 20 MG tablet TAKE 1 TABLET BY MOUTH ONCE DAILY AT  6PM. Patient taking differently: Take 20  mg by mouth daily at 6 PM.  03/27/18   Susy Frizzle, MD  budesonide-formoterol [facility]) 160-4.5 MCG/ACT inhaler Inhale 2 puffs into the lungs 2 (two) times daily as needed (for COPD flares).    [provider]  clopidogrel (PLAVIX) 75 MG tablet Take 1 tablet (75 mg total) by mouth daily. 05/01/17   Susy Frizzle, MD  famotidine (PEPCID) 20 MG tablet TAKE 1 TABLET BY MOUTH TWICE DAILY Patient taking differently: Take 20 mg by mouth at bedtime.   07/27/17   Susy Frizzle, MD  HYDROcodone-acetaminophen (NORCO/VICODIN) 5-325 MG tablet Take 1 tablet by mouth every 6 (six) hours as needed for moderate pain. Patient not taking: Reported on 05/06/2018 04/24/18   Rai, Vernelle Emerald, MD  loperamide (IMODIUM) 2 MG capsule Take 1 capsule (2 mg total) by mouth 3 (three) times daily as needed for diarrhea or loose stools. 04/24/18   Rai, Ripudeep Raliegh Ip, MD  metoprolol succinate (TOPROL-XL) 25 MG 24 hr tablet TAKE 1 TABLET BY MOUTH ONCE DAILY 04/12/18   Susy Frizzle, MD  NITROSTAT 0.4 MG SL tablet DISSOLVE ONE TABLET UNDER THE TONGUE AS DIRECTED Patient not taking: No sig reported 08/09/15   Susy Frizzle, MD  ondansetron (ZOFRAN ODT) 4 MG disintegrating tablet Take 1 tablet (4 mg total) by mouth every 8 (eight) hours as needed for nausea or vomiting. 04/24/18   Rai, Ripudeep K, MD  polyethylene glycol (MIRALAX / GLYCOLAX) packet Take 17 g by mouth daily as needed for moderate constipation or severe constipation. 04/24/18   Rai, Vernelle Emerald, MD  traMADol (ULTRAM) 50 MG tablet Take 1 tablet (50 mg total) by mouth every 8 (eight) hours as needed. 04/24/18   Mendel Corning, MD    Family History Family History  Problem Relation Age of Onset  . Cancer Father 3       LIVER CANCER  . Dementia Brother 66  . CAD Brother     Social History Social History   Tobacco Use  . Smoking status: Former Smoker    Packs/day: 1.00    Years: 65.00    Pack years: 65.00    Types: Cigarettes    Last attempt to quit: 07/08/2009    Years since quitting: 8.8  . Smokeless tobacco: Never Used  Substance Use Topics  . Alcohol use: No  . Drug use: No     Allergies   Penicillins   Review of Systems Review of Systems  Constitutional: Positive for fatigue and fever.  Respiratory: Positive for cough and shortness of breath.   Cardiovascular: Positive for chest pain.  Gastrointestinal: Negative for vomiting.  Genitourinary: Positive for flank pain.  All other  systems reviewed and are negative.    Physical Exam Updated Vital Signs BP 136/66 (BP Location: Right Arm)   Pulse 91   Temp 98.9 F (37.2 C) (Oral)   Resp 18 Comment: 2L [place]  Ht 1.676 m (5\' 6" )   Wt 62 kg   SpO2 100%   BMI 22.06 kg/m   Physical Exam CONSTITUTIONAL: Elderly, chronically ill-appearing HEAD: Normocephalic/atraumatic EYES: EOMI/PERRL ENMT: Mucous membranes moist NECK: supple no meningeal signs SPINE/BACK:entire spine nontender CV: S1/S2 noted, no murmurs/rubs/gallops noted LUNGS: Coarse wheezing bilaterally No crepitus noted to chest wall or neck ABDOMEN: soft, nontender, no rebound or guarding, bowel sounds noted throughout abdomen GU:no cva tenderness NEURO: Pt is awake/alert/appropriate, moves all extremitiesx4.  No facial droop.   EXTREMITIES: pulses normal/equal, full ROM, no lower extremity edema or  calf tenderness SKIN: warm, color normal PSYCH: no abnormalities of mood noted, alert and oriented to situation  ED Treatments / Results  Labs (all labs ordered are listed, but only abnormal results are displayed) Labs Reviewed  COMPREHENSIVE METABOLIC PANEL - Abnormal; Notable for the following components:      Result Value   Sodium 134 (*)    Glucose, Bld 274 (*)    BUN 30 (*)    Calcium 8.2 (*)    Total Protein 5.5 (*)    Albumin 2.6 (*)    AST 14 (*)    GFR calc non Af Amer 58 (*)    All other components within normal limits  LACTIC ACID, PLASMA - Abnormal; Notable for the following components:   Lactic Acid, Venous 3.1 (*)    All other components within normal limits  CBC WITH DIFFERENTIAL/PLATELET - Abnormal; Notable for the following components:   WBC 0.3 (*)    RBC 3.18 (*)    Hemoglobin 10.1 (*)    HCT 32.6 (*)    MCV 102.5 (*)    Platelets 52 (*)    All other components within normal limits  CULTURE, BLOOD (ROUTINE X 2)  CULTURE, BLOOD (ROUTINE X 2)  PROTIME-INR  INFLUENZA PANEL BY PCR (TYPE A & B)  LACTIC ACID, PLASMA    URINALYSIS, ROUTINE W REFLEX MICROSCOPIC    EKG EKG Interpretation  Date/Time:  Sunday May 16 2018 11:05:29 EDT Ventricular Rate:  88 PR Interval:    QRS Duration: 90 QT Interval:  368 QTC Calculation: 446 R Axis:   59 Text Interpretation:  Sinus rhythm Atrial premature complex Abnormal R-wave progression, early transition Minimal ST depression, lateral leads No significant change since last tracing Confirmed by Ripley Fraise [phone]) on 05/16/2018 11:08:50 AM   Radiology Dg Chest 2 View  Result Date: 05/16/2018 CLINICAL DATA:  Metastatic lung cancer, shortness of breath EXAM: CHEST - 2 VIEW COMPARISON:  03/18/2018, 04/01/2018 FINDINGS: Patchy right lower lung airspace process concerning for right basilar pneumonia or aspiration, new since 03/18/2018. Left lung remains clear. Ill-defined known right apical mass is less apparent on the prior study. Enlargement of the left fourth rib metastasis noted. Normal heart size and vascularity. Trachea midline. Aorta atherosclerotic. IMPRESSION: New right basilar patchy airspace process compatible with pneumonia or aspiration Known right apical mass is better demonstrated by PET-CT Enlargement of the left fourth rib destructive metastasis Electronically Signed   By: Jerilynn Mages.  Shick M.D.   On: 05/16/2018 12:19    Procedures Procedures  CRITICAL CARE Performed by: Sharyon Cable Total critical care time: 35 minutes Critical care time was exclusive of separately billable procedures and treating other patients. Critical care was necessary to treat or prevent imminent or life-threatening deterioration. Critical care was time spent personally by me on the following activities: development of treatment plan with patient and/or surrogate as well as nursing, discussions with consultants, evaluation of patient's response to treatment, examination of patient, obtaining history from patient or surrogate, ordering and performing treatments and interventions,  ordering and review of laboratory studies, ordering and review of radiographic studies, pulse oximetry and re-evaluation of patient's condition. Patient with neutropenia, pneumonia, requiring IV fluids, IV antibiotics, new oxygen requirement Medications Ordered in ED Medications  sodium chloride flush (NS) 0.9 % injection 3 mL (has no administration in time range)  sodium chloride 0.9 % bolus 1,000 mL (1,000 mLs Intravenous New Bag/Given 05/16/18 1204)    And  sodium chloride 0.9 % bolus 1,000 mL (  has no administration in time range)  vancomycin (VANCOCIN) IVPB 1000 mg/200 mL premix (has no administration in time range)  levofloxacin (LEVAQUIN) IVPB 750 mg (750 mg Intravenous New Bag/Given 05/16/18 1205)  ipratropium-albuterol (DUONEB) 0.5-2.5 (3) MG/3ML nebulizer solution 3 mL (3 mLs Nebulization Given 05/16/18 1121)     Initial Impression / Assessment and Plan / ED Course  I have reviewed the triage vital signs and the nursing notes.  Pertinent labs & imaging results that were available during my care of the patient were reviewed by me and considered in my medical decision making (see chart for details).       12:39 PM Patient presents with fever and cough.  He was found to have pneumonia in the setting of chemotherapy-induced neutropenia.  Neutropenic precautions have been ordered.  He is received IV fluids and IV antibiotics, and a code sepsis has been called.  Lactate is at 3, but his blood pressures have been appropriate.  He is awake alert at this time.  Placed a palliative consult for patient while in hospital.  He is unsure if he wants to continue with chemotherapy. Discussed with Dr. Benny Lennert for admission Final Clinical Impressions(s) / ED Diagnoses   Final diagnoses:  Chemotherapy-induced neutropenia ([place])  Community acquired pneumonia of right lower lobe of lung ([place])  Hyperglycemia  Dehydration    ED Discharge Orders    None       Ripley Fraise, MD 05/16/18 1239

## 2018-05-16 NOTE — ED Notes (Signed)
Dr Christy Gentles has been in to evaluate pt. Orders placed, resp will come to administer breathing treatment.

## 2018-05-16 NOTE — ED Notes (Signed)
ED TO INPATIENT HANDOFF REPORT  ED Nurse Name and Phone #: Lyberti Thrush [phone]  S Name/Age/Gender Martin [name] 85 y.o. male Room/Bed: WA16/WA16  Code Status   Code Status: DNR  Home/SNF/Other HomeIs this baseline? Yes   Triage Complete: Triage complete  Chief Complaint ShOB; Ca PT  Triage Note EMS reports CA of lungs, increased SHOB with flank pain on deep breaths, Primary site rt lung ? Sub q air on ft side. Pt receives chemo here March 4. 101.9 fever treated with Tylenol. IV 18 L ac with 50 mcg Fentanyl for pain, PT IS FULL CODE.  Vs 101.9-106-140/60- 22 94  On room air 2 L [place] sats 97%. Pt is alert and oriented.   Allergies Allergies  Allergen Reactions  . Penicillins Rash    DID THE REACTION INVOLVE: Swelling of the face/tongue/throat, SOB, or low BP? Yes Sudden or severe rash/hives, skin peeling, or the inside of the mouth or nose? No Did it require medical treatment? Was already in hosp When did it last happen?40 years ago If all above answers are "NO", may proceed with cephalosporin use.     Level of Care/Admitting Diagnosis ED Disposition    ED Disposition Condition Comment   Admit  Hospital Area: [facility] [[id number]]  Level of Care: Med-Surg [16]  Diagnosis: Pneumonia [166063]  Admitting Physician: Karie Kirks [phone]  Attending Physician: Benny Lennert, AVA [place].Pepper  Estimated length of stay: 3 - 4 days  Certification:: I certify this patient will need inpatient services for at least 2 midnights  PT Class (Do Not Modify): Inpatient [101]  PT Acc Code (Do Not Modify): Private [1]       B Medical/Surgery History Past Medical History:  Diagnosis Date  . Arthritis   . Cancer ([place])   . CKD (chronic kidney disease) stage 3, GFR 30-59 ml/min (HCC)   . COPD (chronic obstructive pulmonary disease) ([place])   . Diabetes mellitus    denies, reports resolved with diet  . GERD (gastroesophageal reflux disease)   . H/O: GI bleed   . Hiatal hernia   .  Hyperlipidemia   . Hypertension   . Myocardial infarction [facility])    1978 & 2011  . Peripheral vascular disease, unspecified ([place])   . Renal artery stenosis ([place])   . Stroke [facility])    Past Surgical History:  Procedure Laterality Date  . Aortobifemoral BPG  07/22/10   and Right femoral-popliteal BPG     A IV Location/Drains/Wounds Patient Lines/Drains/Airways Status   Active Line/Drains/Airways    Name:   Placement date:   Placement time:   Site:   Days:   Peripheral IV 05/16/18 Left Antecubital   05/16/18    1103    Antecubital   less than 1          Intake/Output Last 24 hours  Intake/Output Summary (Last 24 hours) at 05/16/2018 1400 Last data filed at 05/16/2018 1400 Gross per 24 hour  Intake 1000 ml  Output -  Net 1000 ml    Labs/Imaging Results for orders placed or performed during the hospital encounter of 05/16/18 (from the past 48 hour(s))  Comprehensive metabolic panel     Status: Abnormal   Collection Time: 05/16/18 10:49 AM  Result Value Ref Range   Sodium 134 (L) 135 - 145 mmol/L   Potassium 4.2 3.5 - 5.1 mmol/L   Chloride 102 98 - 111 mmol/L   CO2 24 22 - 32 mmol/L   Glucose, Bld 274 (H) 70 -  99 mg/dL   BUN 30 (H) 8 - 23 mg/dL   Creatinine, Ser 1.15 0.61 - 1.24 mg/dL   Calcium 8.2 (L) 8.9 - 10.3 mg/dL   Total Protein 5.5 (L) 6.5 - 8.1 g/dL   Albumin 2.6 (L) 3.5 - 5.0 g/dL   AST 14 (L) 15 - 41 U/L   ALT 13 0 - 44 U/L   Alkaline Phosphatase 83 38 - 126 U/L   Total Bilirubin 1.0 0.3 - 1.2 mg/dL   GFR calc non Af Amer 58 (L) >60 mL/min   GFR calc Af Amer >60 >60 mL/min   Anion gap 8 5 - 15    Comment: Performed at [facility], [place] [address], [place], [place] [address]  Lactic acid, plasma     Status: Abnormal   Collection Time: 05/16/18 10:49 AM  Result Value Ref Range   Lactic Acid, Venous 3.1 (HH) 0.5 - 1.9 mmol/L    Comment: CRITICAL RESULT CALLED TO, READ BACK BY AND VERIFIED WITH: HALL,C. RN AT 1146 05/16/18  MULLINS,T Performed at [facility], [place] [address] [place], [place] [address]   CBC with Differential     Status: Abnormal   Collection Time: 05/16/18 10:49 AM  Result Value Ref Range   WBC 0.3 (LL) 4.0 - 10.5 K/uL    Comment: This critical result has verified and been called to [facility] by Karin Golden on 03 08 2020 at 1156, and has been read back. CRITICAL RESULTS VERIFIED   RBC 3.18 (L) 4.22 - 5.81 MIL/uL   Hemoglobin 10.1 (L) 13.0 - 17.0 g/dL   HCT 32.6 (L) 39.0 - 52.0 %   MCV 102.5 (H) 80.0 - 100.0 fL   MCH 31.8 26.0 - 34.0 pg   MCHC 31.0 30.0 - 36.0 g/dL   RDW 14.1 11.5 - 15.5 %   Platelets 52 (L) 150 - 400 K/uL    Comment: Immature Platelet Fraction may be clinically indicated, consider ordering this additional test [id number]    nRBC 0.0 0.0 - 0.2 %   Neutrophils Relative % TOO FEW TO COUNT, SMEAR AVAILABLE FOR REVIEW %   Neutro Abs TOO FEW TO COUNT, SMEAR AVAILABLE FOR REVIEW 1.7 - 7.7 K/uL   Lymphocytes Relative TOO FEW TO COUNT, SMEAR AVAILABLE FOR REVIEW %   Lymphs Abs TOO FEW TO COUNT, SMEAR AVAILABLE FOR REVIEW 0.7 - 4.0 K/uL   Monocytes Relative TOO FEW TO COUNT, SMEAR AVAILABLE FOR REVIEW %   Monocytes Absolute TOO FEW TO COUNT, SMEAR AVAILABLE FOR REVIEW 0.1 - 1.0 K/uL   Eosinophils Relative TOO FEW TO COUNT, SMEAR AVAILABLE FOR REVIEW %   Eosinophils Absolute TOO FEW TO COUNT, SMEAR AVAILABLE FOR REVIEW 0.0 - 0.5 K/uL   Basophils Relative TOO FEW TO COUNT, SMEAR AVAILABLE FOR REVIEW %   Basophils Absolute TOO FEW TO COUNT, SMEAR AVAILABLE FOR REVIEW 0.0 - 0.1 K/uL    Comment: Performed at [facility], [place] [address], [place], [place] [address]  Protime-INR     Status: None   Collection Time: 05/16/18 10:49 AM  Result Value Ref Range   Prothrombin Time 14.9 11.4 - 15.2 seconds   INR 1.2 0.8 - 1.2    Comment: (NOTE) INR goal varies based on device and disease states. Performed at [facility], [place]  [address], [place], [place] [address]   Influenza panel by PCR (type A & B)     Status: None   Collection Time:  05/16/18 11:08 AM  Result Value Ref Range   Influenza A By PCR NEGATIVE NEGATIVE   Influenza B By PCR NEGATIVE NEGATIVE    Comment: (NOTE) The Xpert Xpress Flu assay is intended as an aid in the diagnosis of  influenza and should not be used as a sole basis for treatment.  This  assay is FDA approved for nasopharyngeal swab specimens only. Nasal  washings and aspirates are unacceptable for Xpert Xpress Flu testing. Performed at [facility], [place] [address], [place], [place] [address]    Dg Chest 2 View  Result Date: 05/16/2018 CLINICAL DATA:  Metastatic lung cancer, shortness of breath EXAM: CHEST - 2 VIEW COMPARISON:  03/18/2018, 04/01/2018 FINDINGS: Patchy right lower lung airspace process concerning for right basilar pneumonia or aspiration, new since 03/18/2018. Left lung remains clear. Ill-defined known right apical mass is less apparent on the prior study. Enlargement of the left fourth rib metastasis noted. Normal heart size and vascularity. Trachea midline. Aorta atherosclerotic. IMPRESSION: New right basilar patchy airspace process compatible with pneumonia or aspiration Known right apical mass is better demonstrated by PET-CT Enlargement of the left fourth rib destructive metastasis Electronically Signed   By: Jerilynn Mages.  Shick M.D.   On: 05/16/2018 12:19    Pending Labs Unresulted Labs (From admission, onward)    Start     Ordered   05/17/18 6045  Basic metabolic panel  Tomorrow morning,   R     05/16/18 1313   05/17/18 0500  CBC with Differential/Platelet  Tomorrow morning,   R     05/16/18 1313   05/16/18 1311  Adenovirus antibodies  Once,   R     05/16/18 1313   05/16/18 1311  Respiratory Panel by PCR  (Respiratory virus panel with precautions)  Once,   R     05/16/18 1313   05/16/18 1311  Legionella Pneumophila Serogp 1 Ur Ag  Once,   R     05/16/18  1313   05/16/18 1307  Culture, blood (routine x 2) Call MD if unable to obtain prior to antibiotics being given  BLOOD CULTURE X 2,   R    Comments:  If blood cultures drawn in Emergency Department - Do not draw and cancel order    05/16/18 1313   05/16/18 1307  Culture, sputum-assessment  Once,   R     05/16/18 1313   05/16/18 1307  Gram stain  Once,   R     05/16/18 1313   05/16/18 1307  HIV antibody (Routine Screening)  Once,   R     05/16/18 1313   05/16/18 1307  Strep pneumoniae urinary antigen  Once,   R     05/16/18 1313   05/16/18 1307  Creatinine, serum  (heparin)  Once,   R    Comments:  Baseline for heparin therapy IF NOT ALREADY DRAWN.    05/16/18 1313   05/16/18 1049  Lactic acid, plasma  Now then every 2 hours,   STAT     05/16/18 1048   05/16/18 1049  Culture, blood (Routine x 2)  BLOOD CULTURE X 2,   STAT     05/16/18 1048   05/16/18 1049  Urinalysis, Routine w reflex microscopic  ONCE - STAT,   STAT     05/16/18 1048          Vitals/Pain Today's Vitals   05/16/18 1300 05/16/18 1317 05/16/18 1319 05/16/18 1330  BP: (!) 112/52 (!) 106/52 Marland Kitchen)  106/52 (!) 108/51  Pulse: 86 81 84 82  Resp: 19 16 17 17   Temp:   98.3 F (36.8 C)   TempSrc:   Oral   SpO2: 100% 100% 100% 100%  Weight:      Height:        Isolation Precautions Protective Precautions  Medications Medications  sodium chloride flush (NS) 0.9 % injection 3 mL (has no administration in time range)  vancomycin (VANCOCIN) IVPB 1000 mg/200 mL premix (1,000 mg Intravenous New Bag/Given 05/16/18 1343)  heparin injection 5,000 Units (has no administration in time range)  0.9 %  sodium chloride infusion (has no administration in time range)  aztreonam (AZACTAM) 2 g in sodium chloride 0.9 % 100 mL IVPB (has no administration in time range)  vancomycin (VANCOCIN) IVPB 750 mg/150 ml premix (has no administration in time range)  ipratropium-albuterol (DUONEB) 0.5-2.5 (3) MG/3ML nebulizer solution 3 mL (3 mLs  Nebulization Given 05/16/18 1121)  sodium chloride 0.9 % bolus 1,000 mL (0 mLs Intravenous Stopped 05/16/18 1400)    And  sodium chloride 0.9 % bolus 1,000 mL (1,000 mLs Intravenous Transfusing/Transfer 05/16/18 1400)  levofloxacin (LEVAQUIN) IVPB 750 mg (0 mg Intravenous Stopped 05/16/18 1336)    Mobility walks Low fall risk   Focused Assessments Pulmonary Assessment Handoff:  Lung sounds: Bilateral Breath Sounds: Diminished, Rhonchi, Coarse crackles, Expiratory wheezes L Breath Sounds: Diminished R Breath Sounds: Diminished, Rhonchi O2 Device: Nasal Cannula O2 Flow Rate (L/min): 2 L/min      R Recommendations: See Admitting Provider Note  Report given to:   Additional Notes:

## 2018-05-17 DIAGNOSIS — Z7189 Other specified counseling: Secondary | ICD-10-CM

## 2018-05-17 DIAGNOSIS — D61818 Other pancytopenia: Secondary | ICD-10-CM

## 2018-05-17 DIAGNOSIS — I4891 Unspecified atrial fibrillation: Secondary | ICD-10-CM

## 2018-05-17 DIAGNOSIS — J181 Lobar pneumonia, unspecified organism: Secondary | ICD-10-CM

## 2018-05-17 DIAGNOSIS — R531 Weakness: Secondary | ICD-10-CM

## 2018-05-17 DIAGNOSIS — Z515 Encounter for palliative care: Secondary | ICD-10-CM

## 2018-05-17 LAB — CBC WITH DIFFERENTIAL/PLATELET
Band Neutrophils: 0 %
Basophils Absolute: 0 10*3/uL (ref 0.0–0.1)
Basophils Relative: 0 %
Eosinophils Absolute: 0 10*3/uL (ref 0.0–0.5)
Eosinophils Relative: 0 %
HCT: 24.7 % — ABNORMAL LOW (ref 39.0–52.0)
Hemoglobin: 7.4 g/dL — ABNORMAL LOW (ref 13.0–17.0)
LYMPHS ABS: 0 10*3/uL — AB (ref 0.7–4.0)
Lymphocytes Relative: 0 %
MCH: 31.5 pg (ref 26.0–34.0)
MCHC: 30 g/dL (ref 30.0–36.0)
MCV: 105.1 fL — ABNORMAL HIGH (ref 80.0–100.0)
Monocytes Absolute: 0 10*3/uL — ABNORMAL LOW (ref 0.1–1.0)
Monocytes Relative: 0 %
NRBC: 0 % (ref 0.0–0.2)
Neutrophils Relative %: 0 %
Platelets: 44 10*3/uL — ABNORMAL LOW (ref 150–400)
RBC: 2.35 MIL/uL — ABNORMAL LOW (ref 4.22–5.81)
RDW: 14.4 % (ref 11.5–15.5)
WBC: 0.3 10*3/uL — AB (ref 4.0–10.5)
nRBC: 0 /100 WBC

## 2018-05-17 LAB — BASIC METABOLIC PANEL
ANION GAP: 6 (ref 5–15)
BUN: 22 mg/dL (ref 8–23)
CO2: 18 mmol/L — ABNORMAL LOW (ref 22–32)
Calcium: 6.9 mg/dL — ABNORMAL LOW (ref 8.9–10.3)
Chloride: 112 mmol/L — ABNORMAL HIGH (ref 98–111)
Creatinine, Ser: 0.94 mg/dL (ref 0.61–1.24)
GFR calc Af Amer: >60 mL/min (ref >60)
GFR calc non Af Amer: >60 mL/min (ref >60)
Glucose, Bld: 158 mg/dL — ABNORMAL HIGH (ref 70–99)
Potassium: 3.6 mmol/L (ref 3.5–5.1)
Sodium: 136 mmol/L (ref 135–145)

## 2018-05-17 LAB — RESPIRATORY PANEL BY PCR
Adenovirus: NOT DETECTED
Bordetella pertussis: NOT DETECTED
CORONAVIRUS 229E-RVPPCR: NOT DETECTED
Chlamydophila pneumoniae: NOT DETECTED
Coronavirus HKU1: NOT DETECTED
Coronavirus NL63: DETECTED — AB
Coronavirus OC43: NOT DETECTED
Influenza A: NOT DETECTED
Influenza B: NOT DETECTED
METAPNEUMOVIRUS-RVPPCR: NOT DETECTED
MYCOPLASMA PNEUMONIAE-RVPPCR: NOT DETECTED
PARAINFLUENZA VIRUS 1-RVPPCR: NOT DETECTED
PARAINFLUENZA VIRUS 2-RVPPCR: NOT DETECTED
Parainfluenza Virus 3: NOT DETECTED
Parainfluenza Virus 4: NOT DETECTED
Respiratory Syncytial Virus: NOT DETECTED
Rhinovirus / Enterovirus: NOT DETECTED

## 2018-05-17 LAB — ABO/RH: ABO/RH(D): O POS

## 2018-05-17 LAB — PREPARE RBC (CROSSMATCH)

## 2018-05-17 LAB — HIV ANTIBODY (ROUTINE TESTING W REFLEX): HIV SCREEN 4TH GENERATION: NONREACTIVE

## 2018-05-17 LAB — STREP PNEUMONIAE URINARY ANTIGEN: Strep Pneumo Urinary Antigen: NEGATIVE

## 2018-05-17 MED ORDER — VANCOMYCIN HCL IN DEXTROSE 1-5 GM/200ML-% IV SOLN
1000.0000 mg | INTRAVENOUS | Status: DC
  Administered 2018-05-17: 1000 mg via INTRAVENOUS
  Filled 2018-05-17: qty 200

## 2018-05-17 MED ORDER — SODIUM CHLORIDE 0.9% IV SOLUTION: Freq: Once | INTRAVENOUS | Status: DC

## 2018-05-17 MED ORDER — ONDANSETRON 4 MG PO TBDP
4.0000 mg | ORAL_TABLET | Freq: Four times a day (QID) | ORAL | Status: DC | PRN
  Administered 2018-05-18 – 2018-05-21 (×5): 4 mg via ORAL
  Filled 2018-05-17 (×5): qty 1

## 2018-05-17 NOTE — Progress Notes (Signed)
PROGRESS NOTE    Martin [name]  VPX:[id number] DOB: November 10, 1930 DOA: 05/16/2018 PCP: Susy Frizzle, MD   Brief Narrative: Martin [name] is a 85 y.o. male with a history of [place] for which he is receiving chemotherapy. He also has CKD, COPD, DM II, GERD, Hiatal hernia, hyperlipidemia, hypertension, CAD with history of MI x 2, PVD, history of stroke and renal artery stenosis.    Assessment & Plan:   Active Problems:   Atrial fibrillation (HCC)   Diabetes mellitus, type 2 (HCC)   Coronary artery disease involving native coronary artery of native heart without angina pectoris   Chronic obstructive pulmonary disease (HCC)   CKD (chronic kidney disease) stage 3, GFR 30-59 ml/min (HCC)   Essential hypertension   Anxiety state   Metastatic lung cancer (metastasis from lung to other site) ([place])   Bone metastasis (HCC)   Neutropenia (HCC)   Pneumonia   Right lower lobe pneumonia Associated cough and in setting of neutropenia and overall pancytopenia. RVP significant for coronavirus NL63 -Continue Vancomycin and Aztreonam while blood cultures result -Blood cultures pending -Wean to room air  Pancytopenia New. Significant leukopenia. New macrocytosis. Likely secondary to recent chemotherapy. Afebrile. No bleeding currently. Received Neulasta on 3/4 per discussion with oncology -Discontinue Plavix and Heparin -Oncology: if febrile, recommend Granix -1 unit of PRBC secondary to anemia  Metastatic lung cancer Bone metastasis. Currently on chemotherapy. Previously received radiation. Currently states he is done with chemotherapy. Discussed this with oncologist who agrees with palliative care consult for goals of care going forward.  CKD stage III Stable.  CAD On Plavix -Hold plavix secondary to thrombocytopenia   DVT prophylaxis: SCDs Code Status:   Code Status: DNR Family Communication: None at bedside Disposition Plan: Discharge pending workup for bacteremia, goals of care  discussions, PT eval   Consultants:   Oncology  Palliative care  Procedures:   None  Antimicrobials:  Vancomycin  Aztreonam    Subjective: Coughing. Feels weak.  Objective: Vitals:   05/16/18 1330 05/16/18 1503 05/16/18 2112 05/17/18 1056  BP: (!) 108/51 (!) 121/55 (!) 124/53   Pulse: 82 96 85   Resp: 17 16 16    Temp:  98 F (36.7 C) 98.3 F (36.8 C)   TempSrc:  Oral Oral   SpO2: 100% 95% 96% 97%  Weight:  63.5 kg    Height:  5\' 6"  (1.676 m)      Intake/Output Summary (Last 24 hours) at 05/17/2018 1220 Last data filed at 05/17/2018 1052 Gross per 24 hour  Intake 5317.93 ml  Output 700 ml  Net 4617.93 ml   Filed Weights   05/16/18 1044 05/16/18 1503  Weight: 62 kg 63.5 kg    Examination:  General exam: Appears calm and comfortable Respiratory system: Diminished on auscultation. Respiratory effort normal. Cardiovascular system: S1 & S2 heard, RRR. No murmurs, rubs, gallops or clicks. Gastrointestinal system: Abdomen is nondistended, soft and nontender. No organomegaly or masses felt. Normal bowel sounds heard. Central nervous system: Alert and oriented. No focal neurological deficits. Extremities: No edema. No calf tenderness Skin: No cyanosis. No rashes Psychiatry: Judgement and insight appear normal. Mood & affect appropriate.     Data Reviewed: I have personally reviewed following labs and imaging studies  CBC: Recent Labs  Lab 05/16/18 1049 05/17/18 0441  WBC 0.3* 0.3*  NEUTROABS TOO FEW TO COUNT, SMEAR AVAILABLE FOR REVIEW  --   HGB 10.1* 7.4*  HCT 32.6* 24.7*  MCV 102.5* 105.1*  PLT 52*  44*   Basic Metabolic Panel: Recent Labs  Lab 05/16/18 1049 05/16/18 1531 05/17/18 0441  NA 134*  --  136  K 4.2  --  3.6  CL 102  --  112*  CO2 24  --  18*  GLUCOSE 274*  --  158*  BUN 30*  --  22  CREATININE 1.15 1.09 0.94  CALCIUM 8.2*  --  6.9*   GFR: Estimated Creatinine Clearance: 51.6 mL/min (by C-G formula based on SCr of 0.94  mg/dL). Liver Function Tests: Recent Labs  Lab 05/16/18 1049  AST 14*  ALT 13  ALKPHOS 83  BILITOT 1.0  PROT 5.5*  ALBUMIN 2.6*   No results for input(s): LIPASE, AMYLASE in the last 168 hours. No results for input(s): AMMONIA in the last 168 hours. Coagulation Profile: Recent Labs  Lab 05/16/18 1049 05/16/18 1520  INR 1.2 1.2   Cardiac Enzymes: No results for input(s): CKTOTAL, CKMB, CKMBINDEX, TROPONINI in the last 168 hours. BNP (last 3 results) No results for input(s): PROBNP in the last 8760 hours. HbA1C: No results for input(s): HGBA1C in the last 72 hours. CBG: No results for input(s): GLUCAP in the last 168 hours. Lipid Profile: No results for input(s): CHOL, HDL, LDLCALC, TRIG, CHOLHDL, LDLDIRECT in the last 72 hours. Thyroid Function Tests: No results for input(s): TSH, T4TOTAL, FREET4, T3FREE, THYROIDAB in the last 72 hours. Anemia Panel: No results for input(s): VITAMINB12, FOLATE, FERRITIN, TIBC, IRON, RETICCTPCT in the last 72 hours. Sepsis Labs: Recent Labs  Lab 05/16/18 1049 05/16/18 1345 05/16/18 1520 05/16/18 1842  LATICACIDVEN 3.1* 4.6* 4.7* 3.0*    Recent Results (from the past 240 hour(s))  Culture, blood (Routine x 2)     Status: None (Preliminary result)   Collection Time: 05/16/18 10:57 AM  Result Value Ref Range Status   Specimen Description   Final    BLOOD RIGHT ANTECUBITAL Performed at [facility], [place] [address], [place], [place] [address]    Special Requests   Final    BOTTLES DRAWN AEROBIC AND ANAEROBIC Blood Culture adequate volume Performed at [place] [address], [place], [place] [address]    Culture   Final    NO GROWTH < 24 HOURS Performed at [place] [address], [place], [place] [address]    Report Status PENDING  Incomplete  Culture, blood (Routine x 2)     Status: None (Preliminary result)   Collection Time: 05/16/18 10:57 AM  Result Value Ref Range Status    Specimen Description   Final    BLOOD LEFT ANTECUBITAL Performed at [facility], [place] [address] [place], [place] [address]    Special Requests   Final    BOTTLES DRAWN AEROBIC AND ANAEROBIC Blood Culture adequate volume Performed at [place] [address] [place], [place] [address]    Culture   Final    NO GROWTH < 24 HOURS Performed at [place] [address] [place], [place] [address]    Report Status PENDING  Incomplete  Respiratory Panel by PCR     Status: Abnormal   Collection Time: 05/16/18 11:08 AM  Result Value Ref Range Status   Adenovirus NOT DETECTED NOT DETECTED Final   Coronavirus 229E NOT DETECTED NOT DETECTED Final    Comment: (NOTE) The Coronavirus on the Respiratory Panel, DOES NOT test for the novel  Coronavirus (2019 nCoV)    Coronavirus HKU1 NOT DETECTED  NOT DETECTED Final   Coronavirus NL63 DETECTED (A) NOT DETECTED Final   Coronavirus OC43 NOT DETECTED NOT DETECTED Final   Metapneumovirus NOT DETECTED NOT DETECTED Final   Rhinovirus / Enterovirus NOT DETECTED NOT DETECTED Final   Influenza A NOT DETECTED NOT DETECTED Final   Influenza B NOT DETECTED NOT DETECTED Final   Parainfluenza Virus 1 NOT DETECTED NOT DETECTED Final   Parainfluenza Virus 2 NOT DETECTED NOT DETECTED Final   Parainfluenza Virus 3 NOT DETECTED NOT DETECTED Final   Parainfluenza Virus 4 NOT DETECTED NOT DETECTED Final   Respiratory Syncytial Virus NOT DETECTED NOT DETECTED Final   Bordetella pertussis NOT DETECTED NOT DETECTED Final   Chlamydophila pneumoniae NOT DETECTED NOT DETECTED Final   Mycoplasma pneumoniae NOT DETECTED NOT DETECTED Final    Comment: Performed at [facility], [place] [address] [place], [place] [address]  Culture, blood (routine x 2) Call MD if unable to obtain prior to antibiotics being given     Status: None (Preliminary result)   Collection Time: 05/16/18  3:19 PM  Result Value Ref Range Status   Specimen  Description   Final    BLOOD RIGHT ARM Performed at [place] [address] [place], [place] [address]    Special Requests   Final    BOTTLES DRAWN AEROBIC AND ANAEROBIC Blood Culture adequate volume Performed at [place] [address], [place], [place] [address]    Culture   Final    NO GROWTH < 12 HOURS Performed at [place] [address] [place], [place] [address]    Report Status PENDING  Incomplete  Culture, blood (routine x 2) Call MD if unable to obtain prior to antibiotics being given     Status: None (Preliminary result)   Collection Time: 05/16/18  3:31 PM  Result Value Ref Range Status   Specimen Description   Final    BLOOD Performed at [facility], [place] [address], [place], [place] [address]    Special Requests   Final    NONE Performed at [facility], [place] [address], [place], [place] [address]    Culture   Final    NO GROWTH < 12 HOURS Performed at [place] [address] [place], [place] [address]    Report Status PENDING  Incomplete         Radiology Studies: Dg Chest 2 View  Result Date: 05/16/2018 CLINICAL DATA:  Metastatic lung cancer, shortness of breath EXAM: CHEST - 2 VIEW COMPARISON:  03/18/2018, 04/01/2018 FINDINGS: Patchy right lower lung airspace process concerning for right basilar pneumonia or aspiration, new since 03/18/2018. Left lung remains clear. Ill-defined known right apical mass is less apparent on the prior study. Enlargement of the left fourth rib metastasis noted. Normal heart size and vascularity. Trachea midline. Aorta atherosclerotic. IMPRESSION: New right basilar patchy airspace process compatible with pneumonia or aspiration Known right apical mass is better demonstrated by PET-CT Enlargement of the left fourth rib destructive metastasis Electronically Signed   By: Jerilynn Mages.  Shick M.D.   On: 05/16/2018 12:19         Scheduled Meds: . ALPRAZolam  0.5 mg Oral QHS  . atorvastatin  20 mg Oral Daily  . clopidogrel  75 mg Oral Daily  . famotidine  20 mg Oral Daily  . heparin  5,000 Units Subcutaneous Q8H  . metoprolol succinate  25 mg Oral Daily  . mometasone-formoterol  2 puff Inhalation BID  . predniSONE  10 mg Oral Q breakfast  . sodium chloride flush  3 mL Intravenous Once  . traZODone  25 mg Oral QHS   Continuous Infusions: . sodium chloride 75 mL/hr at 05/17/18 0525  . aztreonam 2 g (05/17/18 0526)  . vancomycin       LOS: 1 day     Cordelia Poche, MD [facility] 05/17/2018, 12:20 PM  If 7PM-7AM, please contact night-coverage [email or web address]

## 2018-05-17 NOTE — Consult Note (Signed)
Consultation Note Date: 05/17/2018   Patient Name: Martin [name]  DOB: [birth year]  MRN: [id number]  Age / Sex: 85 y.o., male  PCP: Martin Frizzle, MD Referring Physician: Mariel Aloe, MD  Reason for Consultation: Establishing goals of care  HPI/Patient Profile: 85 y.o. male    admitted on 05/16/2018   Martin [name] is a 85 y.o. male with a history of [place] for which he recently receivied chemotherapy. He also has CKD, COPD, DM II, GERD, Hiatal hernia, hyperlipidemia, hypertension, CAD with history of MI x 2, PVD, history of stroke and renal artery stenosis.   Clinical Assessment and Goals of Care:  Patient has been admitted with RLL PNA, pancytopenia in the setting of his metastatic lung cancer. He remains admitted to hospital medicine on broad spectrum antibiotics, recently also received PRBC.   A palliative consult has been placed for goals of care discussions.   The patient is an elderly gentleman resting in bed, I introduced myself and palliative care as follows: Palliative medicine is specialized medical care for people living with serious illness. It focuses on providing relief from the symptoms and stress of a serious illness. The goal is to improve quality of life for both the patient and the family.  The patient states that he feels weak. He doesn't want any more chemotherapy, he doesn't want any more radiation.   Brief life review performed, the patient is now retired, he worked as a Librarian, academic at [employer]. He has several neighbors and friends, he also has a sister who checks up on him. His main caregiver is his step grandson Martin [name] who is also his HCPOA agent.   Goals wishes and values discussed, the patient states that he has already made a living will. We talked about DNR DNI versus full code, the patient endorses for DNR DNI, natural and peaceful passing at time of death.    We then talked about hospice philosophy of care. The patient states he is tired of going to rehab facilities, he recently got out of [facility] re[facility] after having been there for 20 days. At present, he is now at assisted living facility [facility].   Please note additional recommendations as listed below. Thank you for the consult.   HCPOA  grand son Martin [name] [phone] or [phone].   SUMMARY OF RECOMMENDATIONS    DNR DNI Continue current mode of care Patient elects for no more chemotherapy, no more radiation. Hence, we discussed about hospice support and the patient is agreeable.  Call placed and discussed with HCPOA grand son Martin [name] at [phone] regarding the patient's wishes, he fully supports the patient. ALF with hospice on discharge, unless patient declines further, at which point, we will have to discuss about residential hospice. Thank you for the consult, PMT to continue to follow along.  Code Status/Advance Care Planning:  DNR    Symptom Management:    continue current mode of care.  Palliative Prophylaxis:  Delirium Protocol   Psycho-social/Spiritual:   Desire for further Chaplaincy support:yes  Additional Recommendations: Education on Hospice  Prognosis:   < 3 months  Discharge Planning: ALF with hospice services following on discharge.      Primary Diagnoses: Present on Admission: . Right lower lobe pneumonia ([place]) . CKD (chronic kidney disease) stage 3, GFR 30-59 ml/min (HCC) . Atrial fibrillation ([place]) . Coronary artery disease involving native coronary artery of native heart without angina pectoris . Chronic obstructive pulmonary disease ([place]) . Essential hypertension . Anxiety state . Metastatic lung cancer (metastasis from lung to other site) [facility] . Bone metastasis ([place]) . Neutropenia ([place])   I have reviewed the medical record, interviewed the patient and family, and examined the patient. The following  aspects are pertinent.  Past Medical History:  Diagnosis Date  . Arthritis   . Cancer ([place])   . CKD (chronic kidney disease) stage 3, GFR 30-59 ml/min (HCC)   . COPD (chronic obstructive pulmonary disease) ([place])   . Diabetes mellitus    denies, reports resolved with diet  . GERD (gastroesophageal reflux disease)   . H/O: GI bleed   . Hiatal hernia   . Hyperlipidemia   . Hypertension   . Myocardial infarction [facility])    1978 & 2011  . Peripheral vascular disease, unspecified ([place])   . Renal artery stenosis ([place])   . Stroke [facility])    Social History   Socioeconomic History  . Marital status: Widowed    Spouse name: Not on file  . Number of children: Not on file  . Years of education: Not on file  . Highest education level: Not on file  Occupational History  . Occupation: retired  Scientific laboratory technician  . Financial resource strain: Not on file  . Food insecurity:    Worry: Not on file    Inability: Not on file  . Transportation needs:    Medical: Not on file    Non-medical: Not on file  Tobacco Use  . Smoking status: Former Smoker    Packs/day: 1.00    Years: 65.00    Pack years: 65.00    Types: Cigarettes    Last attempt to quit: 07/08/2009    Years since quitting: 8.8  . Smokeless tobacco: Never Used  Substance and Sexual Activity  . Alcohol use: No  . Drug use: No  . Sexual activity: Not on file  Lifestyle  . Physical activity:    Days per week: Not on file    Minutes per session: Not on file  . Stress: Not on file  Relationships  . Social connections:    Talks on phone: Not on file    Gets together: Not on file    Attends religious service: Not on file    Active member of club or organization: Not on file    Attends meetings of clubs or organizations: Not on file    Relationship status: Not on file  Other Topics Concern  . Not on file  Social History Narrative  . Not on file   Family History  Problem Relation Age of Onset  . Cancer Father 33       LIVER  CANCER  . Dementia Brother 45  . CAD Brother    Scheduled Meds: . sodium chloride   Intravenous Once  . ALPRAZolam  0.5 mg Oral QHS  . atorvastatin  20 mg Oral Daily  . famotidine  20 mg Oral Daily  . metoprolol succinate  25 mg  Oral Daily  . mometasone-formoterol  2 puff Inhalation BID  . predniSONE  10 mg Oral Q breakfast  . sodium chloride flush  3 mL Intravenous Once  . traZODone  25 mg Oral QHS   Continuous Infusions: . sodium chloride 75 mL/hr at 05/17/18 0525  . aztreonam 2 g (05/17/18 1338)  . vancomycin 1,000 mg (05/17/18 1239)   PRN Meds:.alum & mag hydroxide-simeth, HYDROcodone-acetaminophen, loperamide, nitroGLYCERIN, ondansetron, polyethylene glycol Medications Prior to Admission:  Prior to Admission medications   Medication Sig Start Date End Date Taking? Authorizing Provider  ALPRAZolam (XANAX) 0.5 MG tablet TAKE 1 TABLET BY MOUTH AT BEDTIME Patient taking differently: Take 0.5 mg by mouth at bedtime.  04/05/18  Yes Martin Frizzle, MD  atorvastatin (LIPITOR) 20 MG tablet TAKE 1 TABLET BY MOUTH ONCE DAILY AT  6PM. Patient taking differently: Take 20 mg by mouth daily.  03/27/18  Yes Martin Frizzle, MD  budesonide-formoterol [facility]) 160-4.5 MCG/ACT inhaler Inhale 2 puffs into the lungs 2 (two) times daily as needed (for COPD flares).   Yes [provider]  clopidogrel (PLAVIX) 75 MG tablet Take 1 tablet (75 mg total) by mouth daily. 05/01/17  Yes Martin Frizzle, MD  famotidine (PEPCID) 10 MG tablet Take 20 mg by mouth daily.   Yes [provider]  HYDROcodone-acetaminophen (NORCO/VICODIN) 5-325 MG tablet Take 1 tablet by mouth every 6 (six) hours as needed for moderate pain. 04/24/18  Yes Rai, Ripudeep K, MD  loperamide (IMODIUM) 2 MG capsule Take 1 capsule (2 mg total) by mouth 3 (three) times daily as needed for diarrhea or loose stools. 04/24/18  Yes Rai, Ripudeep K, MD  metoprolol succinate (TOPROL-XL) 25 MG 24 hr tablet TAKE 1 TABLET BY  MOUTH ONCE DAILY Patient taking differently: Take 25 mg by mouth daily.  04/12/18  Yes Pickard, Cammie Mcgee, MD  NITROSTAT 0.4 MG SL tablet DISSOLVE ONE TABLET UNDER THE TONGUE AS DIRECTED Patient taking differently: Place 0.4 mg under the tongue every 5 (five) minutes as needed for chest pain. Max 3 doses 08/09/15  Yes Martin Frizzle, MD  ondansetron (ZOFRAN ODT) 4 MG disintegrating tablet Take 1 tablet (4 mg total) by mouth every 8 (eight) hours as needed for nausea or vomiting. 04/24/18  Yes Rai, Ripudeep K, MD  polyethylene glycol (MIRALAX / GLYCOLAX) packet Take 17 g by mouth daily as needed for moderate constipation or severe constipation. 04/24/18  Yes Rai, Ripudeep K, MD  predniSONE (DELTASONE) 10 MG tablet Take 10 mg by mouth daily with breakfast.   Yes [provider]  traZODone (DESYREL) 25 mg TABS tablet Take 25 mg by mouth at bedtime.   Yes [provider]  famotidine (PEPCID) 20 MG tablet TAKE 1 TABLET BY MOUTH TWICE DAILY Patient not taking: No sig reported 07/27/17   Martin Frizzle, MD  traMADol (ULTRAM) 50 MG tablet Take 1 tablet (50 mg total) by mouth every 8 (eight) hours as needed. Patient not taking: Reported on 05/16/2018 04/24/18   Mendel Corning, MD   Allergies  Allergen Reactions  . Penicillins Rash    DID THE REACTION INVOLVE: Swelling of the face/tongue/throat, SOB, or low BP? Yes Sudden or severe rash/hives, skin peeling, or the inside of the mouth or nose? No Did it require medical treatment? Was already in hosp When did it last happen?40 years ago If all above answers are "NO", may proceed with cephalosporin use.    Review of Systems +weakness  Physical Exam  Weak appearing gentleman Generalized weakness S1 S2 diminished breath sounds No edema Awake alert oriented Abdomen is not distended  Vital Signs: BP (!) 106/47 (BP Location: Right Arm)   Pulse 89   Temp 98.3 F (36.8 C) (Oral)   Resp 16   Ht 5\' 6"  (1.676 m)   Wt 63.5 kg   SpO2  90%   BMI 22.60 kg/m  Pain Scale: 0-10   Pain Score: 4    SpO2: SpO2: 90 % O2 Device:SpO2: 90 % O2 Flow Rate: .O2 Flow Rate (L/min): 2 L/min  IO: Intake/output summary:   Intake/Output Summary (Last 24 hours) at 05/17/2018 1737 Last data filed at 05/17/2018 1441 Gross per 24 hour  Intake 3191.15 ml  Output 850 ml  Net 2341.15 ml   PPS 30% LBM: Last BM Date: 05/17/18 Baseline Weight: Weight: 62 kg Most recent weight: Weight: 63.5 kg     Palliative Assessment/Data:   Flowsheet Rows     Most Recent Value  Intake Tab  Referral Department  -- [ED]  Unit at Time of Referral  ER  Palliative Care Primary Diagnosis  Pulmonary  Date Notified  05/16/18  Palliative Care Type  New Palliative care  Reason for referral  Clarify Goals of Care  Date of Admission  05/16/18  # of days IP prior to Palliative referral  0  Clinical Assessment  Psychosocial & Spiritual Assessment  Palliative Care Outcomes      Time In: 1400 Time Out: 1510 Time Total: 70 min  Greater than 50%  of this time was spent counseling and coordinating care related to the above assessment and plan.  Signed by: Loistine Chance, MD  [phone] Please contact Palliative Medicine Team phone at [phone] for questions and concerns.  For individual provider: See Shea Evans

## 2018-05-17 NOTE — Progress Notes (Signed)
Pharmacy Antibiotic Note  Martin [name] is a 85 y.o. male with metastatic lung cancer admitted on chemotherapy (last txment received on 3/2) presented to the ED on  05/16/2018 with c/o SOB, flank pain, fever and cough. CXR on 3/8 showed "new right basilar patchy airspace process compatible with pneumonia or aspiration."  Pharmacy consulted to dose vancomycin and aztreonam for PNA.  Today, 05/17/18  WBC 0.3  SCr 0.94, CrCL ~ 50 mL/min  Afebrile  Plan:  Increase vancomycin to 1000 mg IV q24h for goal AUC 400-500  Continue aztreonam 2gm IV q8h  Follow renal function and culture data  Obtain vancomycin levels at steady state as indicated __________________________________________  Height: 5\' 6"  (167.6 cm) Weight: 140 lb (63.5 kg) IBW/kg (Calculated) : 63.8  Temp (24hrs), Avg:98.4 F (36.9 C), Min:98 F (36.7 C), Max:98.9 F (37.2 C)  Recent Labs  Lab 05/10/18 0725 05/16/18 1049 05/16/18 1345 05/16/18 1520 05/16/18 1531 05/16/18 1842 05/17/18 0441  WBC 10.7* 0.3*  --   --   --   --  0.3*  CREATININE 1.12 1.15  --   --  1.09  --  0.94  LATICACIDVEN  --  3.1* 4.6* 4.7*  --  3.0*  --     Estimated Creatinine Clearance: 51.6 mL/min (by C-G formula based on SCr of 0.94 mg/dL).    Allergies  Allergen Reactions  . Penicillins Rash    DID THE REACTION INVOLVE: Swelling of the face/tongue/throat, SOB, or low BP? Yes Sudden or severe rash/hives, skin peeling, or the inside of the mouth or nose? No Did it require medical treatment? Was already in hosp When did it last happen?40 years ago If all above answers are "NO", may proceed with cephalosporin use.     Antimicrobials this admission:  aztreonam 3/8 >>  vancomycin 3/8 >>  Levofloxacin one dose in ED on 3/8  Dose adjustments this admission:   Microbiology results:  3/8 BCx: ngtd 3/8 Sputum: sent 3/8 respiratory panel: Pending 3/8 influenza: negative  Thank you for allowing pharmacy to be a part of this  patient's care.  Lenis Noon. PharmD 05/17/2018 7:42 AM

## 2018-05-17 NOTE — Progress Notes (Signed)
CRITICAL VALUE ALERT  Critical Value:  Lactic Acid 3.00  Date & Time Notified: 3/8 2014  Provider Notified: X. Blount NP @ 2019  Orders Received/Actions taken: no new orders

## 2018-05-18 ENCOUNTER — Inpatient Hospital Stay (HOSPITAL_COMMUNITY): Payer: Medicare Other

## 2018-05-18 LAB — TYPE AND SCREEN
ABO/RH(D): O POS
Antibody Screen: NEGATIVE
Unit division: 0

## 2018-05-18 LAB — BPAM RBC
Blood Product Expiration Date: 202003152359
ISSUE DATE / TIME: 202003091841
Unit Type and Rh: 5100

## 2018-05-18 LAB — CREATININE, SERUM
Creatinine, Ser: 0.97 mg/dL (ref 0.61–1.24)
GFR calc Af Amer: >60 mL/min (ref >60)
GFR calc non Af Amer: >60 mL/min (ref >60)

## 2018-05-18 MED ORDER — ALBUTEROL SULFATE (2.5 MG/3ML) 0.083% IN NEBU
2.5000 mg | INHALATION_SOLUTION | Freq: Two times a day (BID) | RESPIRATORY_TRACT | Status: DC
  Administered 2018-05-18 – 2018-05-21 (×7): 2.5 mg via RESPIRATORY_TRACT
  Filled 2018-05-18 (×7): qty 3

## 2018-05-18 MED ORDER — FUROSEMIDE 10 MG/ML IJ SOLN
40.0000 mg | Freq: Once | INTRAMUSCULAR | Status: AC
  Administered 2018-05-18: 40 mg via INTRAVENOUS
  Filled 2018-05-18: qty 4

## 2018-05-18 MED ORDER — ALBUTEROL SULFATE (2.5 MG/3ML) 0.083% IN NEBU
2.5000 mg | INHALATION_SOLUTION | Freq: Once | RESPIRATORY_TRACT | Status: AC
  Administered 2018-05-18: 2.5 mg via RESPIRATORY_TRACT
  Filled 2018-05-18: qty 3

## 2018-05-18 MED ORDER — ALBUTEROL SULFATE (2.5 MG/3ML) 0.083% IN NEBU: 2.5000 mg | INHALATION_SOLUTION | RESPIRATORY_TRACT | Status: DC | PRN

## 2018-05-18 NOTE — Progress Notes (Addendum)
[facility] INPATIENT PROGRESS NOTE  SUBJECTIVE: Martin [name] 85 y.o. male with stage IV (T2b, N0, M1C)non-small cell carcinoma, undifferentiated histology presented with right upper lobe lung mass in addition to metastatic disease to the left fourth rib as well as right sacral area and pleural-based nodularity. This was diagnosed in January 2020.  The patient has been receiving palliative systemic chemotherapy with carboplatin, paclitaxel, and Keytruda every 3 weeks.  Last dose was given on 05/10/2018.  He received Neulasta with his chemotherapy.  He was admitted to the hospital for pneumonia and pancytopenia.  When seen today, the patient reports that he is feeling worse.  He has no specific complaints except for being more fatigue.  He reports that he has ongoing shortness of breath and cough. TMax 99.8 over the past 24 hours.  Denies chest pain.  Denies hemoptysis.  Denies nausea, vomiting, constipation, diarrhea.  ALLERGIES:  is allergic to penicillins.  MEDICATIONS:  Current Facility-Administered Medications  Medication Dose Route Frequency Provider Last Rate Last Dose  . 0.9 %  sodium chloride infusion (Manually program via Guardrails IV Fluids)   Intravenous Once Mariel Aloe, MD      . 0.9 %  sodium chloride infusion   Intravenous Continuous Swayze, Ava, DO 75 mL/hr at 05/17/18 2138    . ALPRAZolam Duanne Moron) tablet 0.5 mg  0.5 mg Oral QHS Swayze, Ava, DO   0.5 mg at 05/17/18 2131  . alum & mag hydroxide-simeth (MAALOX/MYLANTA) 200-200-20 MG/5ML suspension 30 mL  30 mL Oral Q4H PRN Swayze, Ava, DO   30 mL at 05/16/18 2111  . atorvastatin (LIPITOR) tablet 20 mg  20 mg Oral Daily Swayze, Ava, DO   20 mg at 05/17/18 1113  . aztreonam (AZACTAM) 2 g in sodium chloride 0.9 % 100 mL IVPB  2 g Intravenous Q8H Swayze, Ava, DO 200 mL/hr at 05/18/18 0552 2 g at 05/18/18 0552  . famotidine (PEPCID) tablet 20 mg  20 mg Oral Daily Swayze, Ava, DO   20 mg at 05/17/18 1113  .  HYDROcodone-acetaminophen (NORCO/VICODIN) 5-325 MG per tablet 1 tablet  1 tablet Oral Q6H PRN Swayze, Ava, DO      . loperamide (IMODIUM) capsule 2 mg  2 mg Oral TID PRN Swayze, Ava, DO      . metoprolol succinate (TOPROL-XL) 24 hr tablet 25 mg  25 mg Oral Daily Swayze, Ava, DO   25 mg at 05/17/18 1114  . mometasone-formoterol (DULERA) 200-5 MCG/ACT inhaler 2 puff  2 puff Inhalation BID Swayze, Ava, DO   2 puff at 05/17/18 1919  . nitroGLYCERIN (NITROSTAT) SL tablet 0.4 mg  0.4 mg Sublingual Q5 min PRN Swayze, Ava, DO      . ondansetron (ZOFRAN-ODT) disintegrating tablet 4 mg  4 mg Oral Q6H PRN Mariel Aloe, MD      . polyethylene glycol (MIRALAX / GLYCOLAX) packet 17 g  17 g Oral Daily PRN Swayze, Ava, DO      . predniSONE (DELTASONE) tablet 10 mg  10 mg Oral Q breakfast Swayze, Ava, DO   10 mg at 05/17/18 1113  . sodium chloride flush (NS) 0.9 % injection 3 mL  3 mL Intravenous Once Ripley Fraise, MD      . traZODone (DESYREL) tablet 25 mg  25 mg Oral QHS Swayze, Ava, DO   25 mg at 05/17/18 2131  . vancomycin (VANCOCIN) IVPB 1000 mg/200 mL premix  1,000 mg Intravenous Q24H Lenis Noon, RPH 200 mL/hr at  05/17/18 1336      REVIEW OF SYSTEMS:   Review of Systems  Review of Systems  Constitutional: Positive for malaise/fatigue. Negative for chills and fever.  HENT: Negative.   Eyes: Negative.   Respiratory: Positive for cough and shortness of breath. Negative for hemoptysis.   Cardiovascular: Negative.   Gastrointestinal: Negative.   Genitourinary: Negative.   Musculoskeletal: Negative.   Skin: Negative.   Neurological: Negative.   Endo/Heme/Allergies: Negative.   Psychiatric/Behavioral: Negative.       PHYSICAL EXAMINATION:  Vital signs in last 24 hours: Temp:  [98.3 F (36.8 C)-99.8 F (37.7 C)] 99.8 F (37.7 C) (03/10 0436) Pulse Rate:  [78-89] 78 (03/10 0436) Resp:  [16-17] 17 (03/10 0436) BP: (94-115)/(38-57) 94/38 (03/10 0436) SpO2:  [90 %-97 %] 94 % (03/10  0436) Weight change:  Last BM Date: 05/17/18  ECOG PERFORMANCE STATUS: 2 - Symptomatic, <50% confined to bed  Intake/Output from previous day: 03/09 0701 - 03/10 0700 In: 2302.6 [P.O.:480; I.V.:994.3; Blood:430; IV Piggyback:398.3] Out: 450 [Urine:450] General: Alert, awake without distress. Head: Normocephalic atraumatic. Mouth: mucous membranes moist, pharynx normal without lesions Eyes: No scleral icterus.  Pupils are equal and round reactive to light. Resp: Diminished breath sounds.  Expiratory wheezes noted. Cardio: regular rate and rhythm, S1, S2 normal, no murmur, click, rub or gallop GI: soft, non-tender; bowel sounds normal; no masses,  no organomegaly Musculoskeletal: No joint deformity or effusion. Neurological: No motor, sensory deficits.  Skin: No rashes or lesions.  LABORATORY DATA: Lab Results  Component Value Date   WBC 0.3 (LL) 05/17/2018   HGB 7.4 (L) 05/17/2018   HCT 24.7 (L) 05/17/2018   MCV 105.1 (H) 05/17/2018   PLT 44 (L) 05/17/2018    CMP Latest Ref Rng & Units 05/18/2018 05/17/2018 05/16/2018  Glucose 70 - 99 mg/dL - 158(H) -  BUN 8 - 23 mg/dL - 22 -  Creatinine 0.61 - 1.24 mg/dL 0.97 0.94 1.09  Sodium 135 - 145 mmol/L - 136 -  Potassium 3.5 - 5.1 mmol/L - 3.6 -  Chloride 98 - 111 mmol/L - 112(H) -  CO2 22 - 32 mmol/L - 18(L) -  Calcium 8.9 - 10.3 mg/dL - 6.9(L) -  Total Protein 6.5 - 8.1 g/dL - - -  Total Bilirubin 0.3 - 1.2 mg/dL - - -  Alkaline Phos 38 - 126 U/L - - -  AST 15 - 41 U/L - - -  ALT 0 - 44 U/L - - -     RADIOGRAPHIC STUDIES:  Dg Chest 2 View  Result Date: 05/16/2018 CLINICAL DATA:  Metastatic lung cancer, shortness of breath EXAM: CHEST - 2 VIEW COMPARISON:  03/18/2018, 04/01/2018 FINDINGS: Patchy right lower lung airspace process concerning for right basilar pneumonia or aspiration, new since 03/18/2018. Left lung remains clear. Ill-defined known right apical mass is less apparent on the prior study. Enlargement of the left  fourth rib metastasis noted. Normal heart size and vascularity. Trachea midline. Aorta atherosclerotic. IMPRESSION: New right basilar patchy airspace process compatible with pneumonia or aspiration Known right apical mass is better demonstrated by PET-CT Enlargement of the left fourth rib destructive metastasis Electronically Signed   By: Jerilynn Mages.  Shick M.D.   On: 05/16/2018 12:19     ASSESSMENT/PLAN:  This is a very pleasant 85 year old Caucasian male with metastatic non-small cell lung cancer, of undifferentiated histology. He presented with a right upper lobe lung mass in addition to a left chest wall mass as well as metastatic sacral lytic  lesions. He was diagnosed in January 2020. The patient underwent palliative radiotherapy to the right sacral area with improvement to his pain. He was recently treated with palliative systemic chemotherapy and immunotherapy with carboplatin for an AUC of 5, paclitaxel 175 mg, and Keytruda 200 mg IV every 3 weeks with Neulasta support. The patient received his first dose of treatment on April 15, 2018.  He is status post 1 cycle.  He was hospitalized following cycle 1 for what was likely immune mediated colitis.  He was started on prednisone with improvement of his symptoms.  The patient was seen back in the office and opted to resume treatment with dose reduced carboplatin for an AUC of 4, paclitaxel 150 mg meter squared, and Keytruda 200 mg IV every 3 weeks.  Last cycle was given on 05/10/2018.  He received Neulasta with his chemotherapy.  He is now admitted for pancytopenia and pneumonia.  The patient tells me today that he does not wish to pursue any more treatment for his lung cancer.  The palliative care team has been consulted.  Appreciate their assistance.  The patient wishes to be a DNR DNI.  He will likely discharge to assisted living with hospice.  May consider residential hospice if his condition deteriorates.  For pneumonia, continue antibiotics per the  hospitalist team.  The patient has pancytopenia.  No CBC was obtained today.  He is already received Neulasta and no Granix is indicated at this time.  Hemoglobin was 7.4 yesterday and he received 1 unit of packed red blood cells.  Recommend transfusion of packed red blood cells if his hemoglobin is less than 8.0.  Platelet count was 44,000 yesterday.  He denies any bleeding.  Recommend transfusion for a platelet count of less than 20,000 or active bleeding.    LOS: 2 days   Mikey Bussing, DNP, AGPCNP-BC, AOCNP 05/18/18   ADDENDUM: Hematology/Oncology Attending: I saw the patient today.  I agree with the above note.  This is a very pleasant 85 years old white male with metastatic non-small cell lung cancer of undifferentiated histology.  He is status post palliative radiotherapy to left chest wall mass as well as metastatic sacral lytic lesion.  The patient was started on systemic chemotherapy with reduced dose carboplatin, paclitaxel and Keytruda status post 2 cycles.  Unfortunately he has a rough time with the chemotherapy and he was admitted after cycle #1 as well as cycle #2 to the hospital with pancytopenia as well as diarrhea after the first cycle.  He also has significant fatigue and weakness.  He received Neulasta injection after his treatment with the chemotherapy. The patient is currently under treatment for questionable pneumonia. For the anemia, would recommend PRBCs transfusion for hemoglobin less than 8.0G/DL. For platelets count would not recommend platelet transfusion unless his platelets are less than 20,000 or the patient has any active bleeding issues. We will continue to monitor his counts closely with you. I do not think the patient would receive any further systemic chemotherapy at this point.  I may discuss with him treatment with just single agent Keytruda in the future. I will arrange for him a follow-up appointment with me at the cancer center for reevaluation and more  discussion of his treatment options. Thank you for taking good care of Mr. Kun, I will continue to follow up the patient with you and assist in his management on as-needed basis.  Disclaimer: This note was dictated with voice recognition software. Similar sounding words can inadvertently be  transcribed and may be missed upon review. Eilleen Kempf, MD

## 2018-05-18 NOTE — Progress Notes (Signed)
Patient states that he does not want to have labs drawn because there is no need. He states that he will be on palliative care and there is no need to allow blood draws. Continues to refuse to have labs drawn.

## 2018-05-18 NOTE — Progress Notes (Addendum)
Patient refusing to allow lab to draw labs.

## 2018-05-18 NOTE — Progress Notes (Signed)
PROGRESS NOTE    Martin [name]  IRW:[id number] DOB: [birth year] DOA: 05/16/2018 PCP: Susy Frizzle, MD   Brief Narrative: Martin [name] is a 85 y.o. male with a history of [place] for which he is receiving chemotherapy. He also has CKD, COPD, DM II, GERD, Hiatal hernia, hyperlipidemia, hypertension, CAD with history of MI x 2, PVD, history of stroke and renal artery stenosis.    Assessment & Plan:   Principal Problem:   Right lower lobe pneumonia (HCC) Active Problems:   Atrial fibrillation ([place])   Diabetes mellitus, type 2 ([place])   Coronary artery disease involving native coronary artery of native heart without angina pectoris   Chronic obstructive pulmonary disease (HCC)   CKD (chronic kidney disease) stage 3, GFR 30-59 ml/min (HCC)   Essential hypertension   Anxiety state   Metastatic lung cancer (metastasis from lung to other site) ([place])   Bone metastasis (HCC)   Neutropenia (HCC)   Palliative care by specialist   Generalized weakness   Right lower lobe pneumonia Associated cough and in setting of neutropenia and overall pancytopenia. RVP significant for coronavirus NL63. Discontinue antibiotics in setting of negative blood cultures and positive RVP -Wean to room air -Albuterol nebulizer treatment -Chest x-ray  Pancytopenia New. Significant leukopenia. New macrocytosis. Likely secondary to recent chemotherapy. Afebrile. No bleeding currently. Received Neulasta on 3/4 per discussion with oncology. S/p 1 unit of PRBC -Discontinued Plavix and Heparin -Oncology: if febrile, recommend Granix -CBC pending  Metastatic lung cancer Bone metastasis. Currently on chemotherapy. Previously received radiation. Currently states he is done with chemotherapy. Discussed this with oncologist who agrees with palliative care consult for goals of care going forward. -Palliative care recommendations: ALF with hospice vs residential hospice  CKD stage III Stable.  CAD On Plavix -Hold  plavix secondary to thrombocytopenia   DVT prophylaxis: SCDs Code Status:   Code Status: DNR Family Communication: None at bedside Disposition Plan: Discharge pending workup for bacteremia, goals of care discussions, PT eval   Consultants:   Oncology  Palliative care  Procedures:   None  Antimicrobials:  Vancomycin  Aztreonam    Subjective: Shortness of breath today  Objective: Vitals:   05/17/18 1919 05/17/18 2130 05/18/18 0436 05/18/18 1000  BP:  (!) 115/57 (!) 94/38 (!) 105/48  Pulse:  81 78   Resp:  16 17   Temp:  99.6 F (37.6 C) 99.8 F (37.7 C)   TempSrc:  Oral Oral   SpO2: 94% 96% 94%   Weight:      Height:        Intake/Output Summary (Last 24 hours) at 05/18/2018 1216 Last data filed at 05/18/2018 0817 Gross per 24 hour  Intake 2302.59 ml  Output 150 ml  Net 2152.59 ml   Filed Weights   05/16/18 1044 05/16/18 1503  Weight: 62 kg 63.5 kg    Examination:  General exam: Appears calm and comfortable Respiratory system: reduced air movement with faint wheezing. Respiratory effort normal. Cardiovascular system: S1 & S2 heard, RRR. No murmurs, rubs, gallops or clicks. Gastrointestinal system: Abdomen is nondistended, soft and nontender. No organomegaly or masses felt. Normal bowel sounds heard. Central nervous system: Alert and oriented. Extremities: No edema. No calf tenderness Skin: No cyanosis. No rashes Psychiatry: Judgement and insight appear normal. Mood & affect appropriate.     Data Reviewed: I have personally reviewed following labs and imaging studies  CBC: Recent Labs  Lab 05/16/18 1049 05/17/18 0441  WBC 0.3* 0.3*  NEUTROABS  TOO FEW TO COUNT, SMEAR AVAILABLE FOR REVIEW  --   HGB 10.1* 7.4*  HCT 32.6* 24.7*  MCV 102.5* 105.1*  PLT 52* 44*   Basic Metabolic Panel: Recent Labs  Lab 05/16/18 1049 05/16/18 1531 05/17/18 0441 05/18/18 0514  NA 134*  --  136  --   K 4.2  --  3.6  --   CL 102  --  112*  --   CO2 24  --   18*  --   GLUCOSE 274*  --  158*  --   BUN 30*  --  22  --   CREATININE 1.15 1.09 0.94 0.97  CALCIUM 8.2*  --  6.9*  --    GFR: Estimated Creatinine Clearance: 50 mL/min (by C-G formula based on SCr of 0.97 mg/dL). Liver Function Tests: Recent Labs  Lab 05/16/18 1049  AST 14*  ALT 13  ALKPHOS 83  BILITOT 1.0  PROT 5.5*  ALBUMIN 2.6*   No results for input(s): LIPASE, AMYLASE in the last 168 hours. No results for input(s): AMMONIA in the last 168 hours. Coagulation Profile: Recent Labs  Lab 05/16/18 1049 05/16/18 1520  INR 1.2 1.2   Cardiac Enzymes: No results for input(s): CKTOTAL, CKMB, CKMBINDEX, TROPONINI in the last 168 hours. BNP (last 3 results) No results for input(s): PROBNP in the last 8760 hours. HbA1C: No results for input(s): HGBA1C in the last 72 hours. CBG: No results for input(s): GLUCAP in the last 168 hours. Lipid Profile: No results for input(s): CHOL, HDL, LDLCALC, TRIG, CHOLHDL, LDLDIRECT in the last 72 hours. Thyroid Function Tests: No results for input(s): TSH, T4TOTAL, FREET4, T3FREE, THYROIDAB in the last 72 hours. Anemia Panel: No results for input(s): VITAMINB12, FOLATE, FERRITIN, TIBC, IRON, RETICCTPCT in the last 72 hours. Sepsis Labs: Recent Labs  Lab 05/16/18 1049 05/16/18 1345 05/16/18 1520 05/16/18 1842  LATICACIDVEN 3.1* 4.6* 4.7* 3.0*    Recent Results (from the past 240 hour(s))  Culture, blood (Routine x 2)     Status: None (Preliminary result)   Collection Time: 05/16/18 10:57 AM  Result Value Ref Range Status   Specimen Description   Final    BLOOD RIGHT ANTECUBITAL Performed at [facility], [place] [address], [place], [place] [address]    Special Requests   Final    BOTTLES DRAWN AEROBIC AND ANAEROBIC Blood Culture adequate volume Performed at [facility] [address] [place], [place] [address]    Culture   Final    NO GROWTH 2 DAYS Performed at [facility] [address], [place], [place] [address]    Report Status PENDING  Incomplete  Culture, blood (Routine x 2)     Status: None (Preliminary result)   Collection Time: 05/16/18 10:57 AM  Result Value Ref Range Status   Specimen Description   Final    BLOOD LEFT ANTECUBITAL Performed at [facility], [place] [address] [place], [place] [address]    Special Requests   Final    BOTTLES DRAWN AEROBIC AND ANAEROBIC Blood Culture adequate volume Performed at [facility] [address] [place], [place] [address]    Culture   Final    NO GROWTH 2 DAYS Performed at [facility] [address] [place], [place] [address]    Report Status PENDING  Incomplete  Respiratory Panel by PCR     Status: Abnormal   Collection Time: 05/16/18 11:08 AM  Result Value  Ref Range Status   Adenovirus NOT DETECTED NOT DETECTED Final   Coronavirus 229E NOT DETECTED NOT DETECTED Final    Comment: (NOTE) The Coronavirus on the Respiratory Panel, DOES NOT test for the novel  Coronavirus (2019 nCoV)    Coronavirus HKU1 NOT DETECTED NOT DETECTED Final   Coronavirus NL63 DETECTED (A) NOT DETECTED Final   Coronavirus OC43 NOT DETECTED NOT DETECTED Final   Metapneumovirus NOT DETECTED NOT DETECTED Final   Rhinovirus / Enterovirus NOT DETECTED NOT DETECTED Final   Influenza A NOT DETECTED NOT DETECTED Final   Influenza B NOT DETECTED NOT DETECTED Final   Parainfluenza Virus 1 NOT DETECTED NOT DETECTED Final   Parainfluenza Virus 2 NOT DETECTED NOT DETECTED Final   Parainfluenza Virus 3 NOT DETECTED NOT DETECTED Final   Parainfluenza Virus 4 NOT DETECTED NOT DETECTED Final   Respiratory Syncytial Virus NOT DETECTED NOT DETECTED Final   Bordetella pertussis NOT DETECTED NOT DETECTED Final   Chlamydophila pneumoniae NOT DETECTED NOT DETECTED Final   Mycoplasma pneumoniae NOT DETECTED NOT DETECTED Final    Comment: Performed at [facility], [place] [address], [place], [place] [address]  Culture,  blood (routine x 2) Call MD if unable to obtain prior to antibiotics being given     Status: None (Preliminary result)   Collection Time: 05/16/18  3:19 PM  Result Value Ref Range Status   Specimen Description   Final    BLOOD RIGHT ARM Performed at [facility], [place] [address] [place], [place] [address]    Special Requests   Final    BOTTLES DRAWN AEROBIC AND ANAEROBIC Blood Culture adequate volume Performed at [facility] [address], [place], [place] [address]    Culture   Final    NO GROWTH 2 DAYS Performed at [facility] [address], [place], [place] [address]    Report Status PENDING  Incomplete  Culture, blood (routine x 2) Call MD if unable to obtain prior to antibiotics being given     Status: None (Preliminary result)   Collection Time: 05/16/18  3:31 PM  Result Value Ref Range Status   Specimen Description   Final    BLOOD Performed at [facility], [place] [address], [place], [place] [address]    Special Requests   Final    NONE Performed at [facility], [place] [address] [place], [place] [address]    Culture   Final    NO GROWTH 2 DAYS Performed at [facility] [address], [place], [place] [address]    Report Status PENDING  Incomplete         Radiology Studies: No results found.      Scheduled Meds: . sodium chloride   Intravenous Once  . albuterol  2.5 mg Nebulization Once  . ALPRAZolam  0.5 mg Oral QHS  . atorvastatin  20 mg Oral Daily  . famotidine  20 mg Oral Daily  . metoprolol succinate  25 mg Oral Daily  . mometasone-formoterol  2 puff Inhalation BID  . predniSONE  10 mg Oral Q breakfast  . sodium chloride flush  3 mL Intravenous Once  . traZODone  25 mg Oral QHS   Continuous Infusions: . sodium chloride 75 mL/hr at 05/17/18 2138     LOS: 2 days     Cordelia Poche, MD [facility] 05/18/2018, 12:16 PM  If 7PM-7AM, please contact  night-coverage [email or web address]

## 2018-05-19 ENCOUNTER — Inpatient Hospital Stay (HOSPITAL_COMMUNITY): Payer: Medicare Other

## 2018-05-19 ENCOUNTER — Inpatient Hospital Stay: Payer: Medicare Other

## 2018-05-19 DIAGNOSIS — C3411 Malignant neoplasm of upper lobe, right bronchus or lung: Secondary | ICD-10-CM

## 2018-05-19 DIAGNOSIS — D649 Anemia, unspecified: Secondary | ICD-10-CM

## 2018-05-19 LAB — CREATININE, SERUM
Creatinine, Ser: 0.99 mg/dL (ref 0.61–1.24)
GFR calc Af Amer: >60 mL/min (ref >60)
GFR calc non Af Amer: >60 mL/min (ref >60)

## 2018-05-19 LAB — LEGIONELLA PNEUMOPHILA SEROGP 1 UR AG: L. pneumophila Serogp 1 Ur Ag: NEGATIVE

## 2018-05-19 LAB — CBC
HCT: 29.6 % — ABNORMAL LOW (ref 39.0–52.0)
Hemoglobin: 9.5 g/dL — ABNORMAL LOW (ref 13.0–17.0)
MCH: 32 pg (ref 26.0–34.0)
MCHC: 32.1 g/dL (ref 30.0–36.0)
MCV: 99.7 fL (ref 80.0–100.0)
NRBC: 0.3 % — AB (ref 0.0–0.2)
Platelets: 77 10*3/uL — ABNORMAL LOW (ref 150–400)
RBC: 2.97 MIL/uL — ABNORMAL LOW (ref 4.22–5.81)
RDW: 14.7 % (ref 11.5–15.5)
WBC: 7.2 10*3/uL (ref 4.0–10.5)

## 2018-05-19 LAB — ADENOVIRUS ANTIBODIES

## 2018-05-19 NOTE — Progress Notes (Signed)
Daily Progress Note   Patient Name: Martin [name]       Date: 05/19/2018 DOB: [birth year]  Age: 85 y.o. MRN#: [id number] Attending Physician: Mariel Aloe, MD Primary Care Physician: Susy Frizzle, MD Admit Date: 05/16/2018  Reason for Consultation/Follow-up: Establishing goals of care  Subjective: I met today with Mr. Martin [name].  He reports feeling better today and being happy about the opportunity to work with PT  Discussed plans for discharge, and he reports that he now feels that he need to have rehab either at facility or at ALF on discharge.  Reports having good experience at [facility] and reports that this would be preference if he qualifies.  Once he regains as much strength and functional status as possible, he may want to elect his hospice benefit if he continues to decline further disease modifying therapy.  Length of Stay: 3  Current Medications: Scheduled Meds:  . sodium chloride   Intravenous Once  . albuterol  2.5 mg Nebulization BID  . ALPRAZolam  0.5 mg Oral QHS  . atorvastatin  20 mg Oral Daily  . famotidine  20 mg Oral Daily  . metoprolol succinate  25 mg Oral Daily  . mometasone-formoterol  2 puff Inhalation BID  . predniSONE  10 mg Oral Q breakfast  . sodium chloride flush  3 mL Intravenous Once  . traZODone  25 mg Oral QHS    Continuous Infusions:   PRN Meds: albuterol, alum & mag hydroxide-simeth, HYDROcodone-acetaminophen, loperamide, nitroGLYCERIN, ondansetron, polyethylene glycol  Physical Exam         General: Alert, awake, in no acute distress. Weak appearing Heart: Regular rate and rhythm. No murmur appreciated. Lungs: Diminished air movement, clear Abdomen: Soft, nontender, nondistended, positive bowel sounds.  Ext: No significant  edema Skin: Warm and dry Neuro: Grossly intact, nonfocal.  Vital Signs: BP (!) 99/42 (BP Location: Left Arm)   Pulse 91   Temp 99.6 F (37.6 C) (Oral)   Resp 20   Ht '5\' 6"'  (1.676 m)   Wt 63.5 kg   SpO2 95%   BMI 22.60 kg/m  SpO2: SpO2: 95 % O2 Device: O2 Device: Nasal Cannula O2 Flow Rate: O2 Flow Rate (L/min): 0.5 L/min  Intake/output summary:   Intake/Output Summary (Last 24 hours) at 05/19/2018 1526 Last data filed at  05/19/2018 0914 Gross per 24 hour  Intake 240 ml  Output 1725 ml  Net -1485 ml   LBM: Last BM Date: 05/17/18 Baseline Weight: Weight: 62 kg Most recent weight: Weight: 63.5 kg       Palliative Assessment/Data:    Flowsheet Rows     Most Recent Value  Intake Tab  Referral Department  -- [ED]  Unit at Time of Referral  ER  Palliative Care Primary Diagnosis  Pulmonary  Date Notified  05/16/18  Palliative Care Type  New Palliative care  Reason for referral  Clarify Goals of Care  Date of Admission  05/16/18  # of days IP prior to Palliative referral  0  Clinical Assessment  Psychosocial & Spiritual Assessment  Palliative Care Outcomes      Patient Active Problem List   Diagnosis Date Noted  . Palliative care by specialist   . Generalized weakness   . Right lower lobe pneumonia ([place]) 05/16/2018  . Dehydration   . Diarrhea 04/20/2018  . Neutropenia ([place]) 04/20/2018  . Goals of care, counseling/discussion 04/06/2018  . Encounter for antineoplastic chemotherapy 04/06/2018  . Encounter for antineoplastic immunotherapy 04/06/2018  . Bone metastasis ([place]) 03/18/2018  . Metastatic lung cancer (metastasis from lung to other site) ([place]) 03/16/2018  . Insomnia 10/05/2015  . Late effect of stroke 10/05/2015  . Slow transit constipation   . Nausea without vomiting   . Essential hypertension   . Anxiety state   . Cerebellar infarct ([place]) 07/17/2015  . Cerebellar stroke ([place])   . Coronary artery disease involving native coronary artery of native  heart without angina pectoris   . Chronic obstructive pulmonary disease ([place])   . CKD (chronic kidney disease) stage 3, GFR 30-59 ml/min (HCC)   . Leukocytosis   . Thrombocytopenia ([place])   . Cerebrovascular accident (CVA) ([place])   . Stroke ([place]) 07/15/2015  . CVA (cerebral infarction) 07/15/2015  . Hypothermia 07/15/2015  . Atherosclerotic PVD with intermittent claudication ([place]) 12/29/2013  . Aftercare following surgery of the circulatory system, [place] 12/23/2012  . Atherosclerosis of native arteries of the extremities with intermittent claudication 06/17/2012  . Diabetes mellitus, type 2 ([place]) 06/11/2012  . Peripheral vascular disease, unspecified ([place]) 05/29/2011  . HLD (hyperlipidemia) 08/14/2009  . MYOCARDIAL INFARCTION 08/14/2009  . Atrial fibrillation ([place]) 08/14/2009  . TOBACCO ABUSE, HX OF 08/14/2009    Palliative Care Assessment & Plan   Patient Profile: 85 y.o. male    admitted on 05/16/2018   Martin [name] a 85 y.o.male with a history of[place]CLCA for which he recently receivied chemotherapy. He also has CKD, COPD, DM II, GERD, Hiatal hernia, hyperlipidemia, hypertension, CAD with history of MI x 2, PVD, history of stroke and renal artery stenosis. Admitted with RLL PNA.  Recommendations/Plan:  Mr. Martin [name] reports that he wants to pursue trial of rehab on discharge.  Called and d/w his grandson as well.  He is not opposed to hospice at some point in the future, but he feels that he needs to attempt to regain as much functional status as possible and then assess his situation.  Code Status:    Code Status Orders  (From admission, onward)         Start     Ordered   05/16/18 1308  Do not attempt resuscitation (DNR)  Continuous    Question Answer Comment  In the event of cardiac or respiratory ARREST Do not call a "code blue"   In the event of cardiac or  respiratory ARREST Do not perform Intubation, CPR, defibrillation or ACLS   In the event of cardiac or respiratory  ARREST Use medication by any route, position, wound care, and other measures to relive pain and suffering. May use oxygen, suction and manual treatment of airway obstruction as needed for comfort.      05/16/18 1313        Code Status History    Date Active Date Inactive Code Status Order ID Comments User Context   04/20/2018 9136 04/24/2018 2032 DNR [id number]  Karmen Bongo, MD ED   03/16/2018 1207 03/19/2018 1856 Full Code [id number]  Karmen Bongo, MD ED   07/17/2015 1812 07/26/2015 1436 Full Code [id number]  Cathlyn Parsons, PA-C Inpatient   07/17/2015 1812 07/17/2015 1812 Full Code [id number]  Cathlyn Parsons, PA-C Inpatient   07/15/2015 1709 07/17/2015 1812 Full Code [id number]  Willia Craze, NP Inpatient    Advance Directive Documentation     Most Recent Value  Type of Advance Directive  Healthcare Power of Attorney  Pre-existing out of facility DNR order (yellow form or pink MOST form)  -  "MOST" Form in Place?  -       Prognosis:   < 6 months  Discharge Planning:  [place] for rehab with Palliative care service follow-up vs ALF with outpatient rehab?  Care plan was discussed with patient, grandson, Dr. Teryl Lucy  Thank you for allowing the Palliative Medicine Team to assist in the care of this patient.   Total Time 30 Prolonged Time Billed No      Greater than 50%  of this time was spent counseling and coordinating care related to the above assessment and plan.  Micheline Rough, MD  Please contact Palliative Medicine Team phone at [phone] for questions and concerns.

## 2018-05-19 NOTE — Care Management Important Message (Signed)
Important Message  Patient Details  Name: YOHAN SAMONS MRN: [id number] Date of Birth: Aug 02, 1930   Medicare Important Message Given:  Yes    Kerin Salen 05/19/2018, 10:29 AMImportant Message  Patient Details  Name: RIGEL FILSINGER MRN: [id number] Date of Birth: 14-Aug-1930   Medicare Important Message Given:  Yes    Kerin Salen 05/19/2018, 10:28 AM

## 2018-05-19 NOTE — Evaluation (Signed)
Physical Therapy Evaluation Patient Details Name: ALYSSA MANCERA MRN: [id number] DOB: 05-23-1930 Today's Date: 05/19/2018   History of Present Illness  85 yo male admitted with Pna. Hx of lung ca with mets, CVA, PVD, CAD, COPD, DM, MI  Clinical Impression  On eval, pt required Min assist for mobility. He walked ~10 feet in his room with use of a RW. Pt presents with general weakness, decreased activity tolerance, and impaired gait and balance. Will follow and progress activity as tolerated. Unsure of level of assistance available to pt at his ALF-recommend CM/SW consult.     Follow Up Recommendations Home health PT;Supervision/Assistance - 24 hour(at ALF as long as staff can provide current level of care)    Equipment Recommendations  None recommended by PT    Recommendations for Other Services       Precautions / Restrictions Precautions Precautions: Fall Precaution Comments: on chemo (pt has currently elected to stop any further txs) Restrictions Weight Bearing Restrictions: No      Mobility  Bed Mobility Overal bed mobility: Needs Assistance Bed Mobility: Supine to Sit;Sit to Supine     Supine to sit: Min guard;HOB elevated Sit to supine: Min guard;HOB elevated   General bed mobility comments: Increaased time. REliance on bedrail.   Transfers Overall transfer level: Needs assistance Equipment used: Rolling walker (2 wheeled) Transfers: Sit to/from Stand Sit to Stand: Min assist         General transfer comment: Assist to rise, stabilize, control descent. VCs safety, technique, hand placement.   Ambulation/Gait Ambulation/Gait assistance: Min assist Gait Distance (Feet): 10 Feet Assistive device: Rolling walker (2 wheeled) Gait Pattern/deviations: Step-through pattern;Decreased stride length     General Gait Details: Assist to stabilize pt throughout distance. Pt fatigues fairly easily. dyspnea 2/4. O2 >90% on RA.   Stairs            Wheelchair  Mobility    Modified Rankin (Stroke Patients Only)       Balance Overall balance assessment: Needs assistance         Standing balance support: Bilateral upper extremity supported Standing balance-Leahy Scale: Poor                               Pertinent Vitals/Pain Pain Assessment: No/denies pain    Home Living Family/patient expects to be discharged to:: Assisted living     Type of Home: Assisted living                Prior Function Level of Independence: Independent with assistive device(s)               Hand Dominance        Extremity/Trunk Assessment   Upper Extremity Assessment Upper Extremity Assessment: Generalized weakness    Lower Extremity Assessment Lower Extremity Assessment: Generalized weakness    Cervical / Trunk Assessment Cervical / Trunk Assessment: Kyphotic  Communication   Communication: No difficulties  Cognition Arousal/Alertness: Awake/alert Behavior During Therapy: WFL for tasks assessed/performed Overall Cognitive Status: Within Functional Limits for tasks assessed                                        General Comments      Exercises     Assessment/Plan    PT Assessment Patient needs continued PT services  PT Problem List Decreased strength;Decreased  balance;Decreased activity tolerance;Decreased mobility;Decreased knowledge of use of DME       PT Treatment Interventions DME instruction;Gait training;Therapeutic activities;Functional mobility training;Balance training;Patient/family education;Therapeutic exercise    PT Goals (Current goals can be found in the Care Plan section)  Acute Rehab PT Goals Patient Stated Goal: to walk PT Goal Formulation: With patient Time For Goal Achievement: 06/02/18 Potential to Achieve Goals: Fair    Frequency Min 3X/week   Barriers to discharge        Co-evaluation               AM-PAC PT "6 Clicks" Mobility  Outcome Measure  Help needed turning from your back to your side while in a flat bed without using bedrails?: A Little Help needed moving from lying on your back to sitting on the side of a flat bed without using bedrails?: A Little Help needed moving to and from a bed to a chair (including a wheelchair)?: A Little Help needed standing up from a chair using your arms (e.g., wheelchair or bedside chair)?: A Little Help needed to walk in hospital room?: A Little Help needed climbing 3-5 steps with a railing? : A Lot 6 Click Score: 17    End of Session Equipment Utilized During Treatment: Gait belt Activity Tolerance: Patient limited by fatigue Patient left: in bed;with call bell/phone within reach;with bed alarm set   PT Visit Diagnosis: Unsteadiness on feet (R26.81);Muscle weakness (generalized) (M62.81);Difficulty in walking, not elsewhere classified (R26.2)    Time: 8978-4784 PT Time Calculation (min) (ACUTE ONLY): 24 min   Charges:   PT Evaluation $PT Eval Moderate Complexity: 1 Mod PT Treatments $Gait Training: 8-22 mins          Weston Anna, PT Acute Rehabilitation Services Pager: [phone] Office: [phone]

## 2018-05-19 NOTE — Progress Notes (Signed)
PROGRESS NOTE    Martin [name]  YHC:[id number] DOB: 1931/01/22 DOA: 05/16/2018 PCP: Susy Frizzle, MD   Brief Narrative: Martin [name] is a 85 y.o. male with a history of [place] for which he is receiving chemotherapy. He also has CKD, COPD, DM II, GERD, Hiatal hernia, hyperlipidemia, hypertension, CAD with history of MI x 2, PVD, history of stroke and renal artery stenosis.    Assessment & Plan:   Principal Problem:   Right lower lobe pneumonia (HCC) Active Problems:   Atrial fibrillation ([place])   Diabetes mellitus, type 2 ([place])   Coronary artery disease involving native coronary artery of native heart without angina pectoris   Chronic obstructive pulmonary disease (HCC)   CKD (chronic kidney disease) stage 3, GFR 30-59 ml/min (HCC)   Essential hypertension   Anxiety state   Metastatic lung cancer (metastasis from lung to other site) ([place])   Bone metastasis (HCC)   Neutropenia (HCC)   Palliative care by specialist   Generalized weakness   Right lower lobe pneumonia Associated cough and in setting of neutropenia and overall pancytopenia. RVP significant for coronavirus NL63. Discontinue antibiotics in setting of negative blood cultures and positive RVP -Wean to room air -Albuterol nebulizer treatment  Pancytopenia New. Significant leukopenia. New macrocytosis. Likely secondary to recent chemotherapy. Afebrile. No bleeding currently. Received Neulasta on 3/4 per discussion with oncology. S/p 1 unit of PRBC -Discontinued Plavix and Heparin -Oncology: if febrile, recommend Granix -CBC today (patient refused yesterday)  Metastatic lung cancer Bone metastasis. Currently on chemotherapy. Previously received radiation. Currently states he is done with chemotherapy. Discussed this with oncologist who agrees with palliative care consult for goals of care going forward. -Palliative care recommendations: ALF with hospice vs residential hospice  CKD stage III Stable.  CAD On  Plavix -Hold plavix secondary to thrombocytopenia   DVT prophylaxis: SCDs Code Status:   Code Status: DNR Family Communication: None at bedside Disposition Plan: Discharge to ALF with HHPT and hospice once weaned to room air   Consultants:   Oncology  Palliative care  Procedures:   None  Antimicrobials:  Vancomycin  Aztreonam    Subjective: Shortness of breath improved today.   Objective: Vitals:   05/18/18 2243 05/19/18 0458 05/19/18 0823 05/19/18 1300  BP: (!) 115/54 (!) 122/46  (!) 99/42  Pulse: 80 85  91  Resp: 16 17  20   Temp: 97.8 F (36.6 C) 97.8 F (36.6 C)  99.6 F (37.6 C)  TempSrc: Oral Oral  Oral  SpO2: 96% 97% 95% 95%  Weight:      Height:        Intake/Output Summary (Last 24 hours) at 05/19/2018 1329 Last data filed at 05/19/2018 0914 Gross per 24 hour  Intake 240 ml  Output 1725 ml  Net -1485 ml   Filed Weights   05/16/18 1044 05/16/18 1503  Weight: 62 kg 63.5 kg    Examination:  General exam: Appears calm and comfortable Respiratory system: Diminished to auscultation. End expiratory wheeze is very faint. Respiratory effort normal. Cardiovascular system: S1 & S2 heard, RRR. No murmurs, rubs, gallops or clicks. Gastrointestinal system: Abdomen is nondistended, soft and nontender. No organomegaly or masses felt. Normal bowel sounds heard. Central nervous system: Alert and oriented. No focal neurological deficits. Extremities: No edema. No calf tenderness Skin: No cyanosis. No rashes Psychiatry: Judgement and insight appear normal. Mood & affect appropriate.     Data Reviewed: I have personally reviewed following labs and imaging studies  CBC:  Recent Labs  Lab 05/16/18 1049 05/17/18 0441  WBC 0.3* 0.3*  NEUTROABS TOO FEW TO COUNT, SMEAR AVAILABLE FOR REVIEW  --   HGB 10.1* 7.4*  HCT 32.6* 24.7*  MCV 102.5* 105.1*  PLT 52* 44*   Basic Metabolic Panel: Recent Labs  Lab 05/16/18 1049 05/16/18 1531 05/17/18 0441 05/18/18  0514 05/19/18 0335  NA 134*  --  136  --   --   K 4.2  --  3.6  --   --   CL 102  --  112*  --   --   CO2 24  --  18*  --   --   GLUCOSE 274*  --  158*  --   --   BUN 30*  --  22  --   --   CREATININE 1.15 1.09 0.94 0.97 0.99  CALCIUM 8.2*  --  6.9*  --   --    GFR: Estimated Creatinine Clearance: 49 mL/min (by C-G formula based on SCr of 0.99 mg/dL). Liver Function Tests: Recent Labs  Lab 05/16/18 1049  AST 14*  ALT 13  ALKPHOS 83  BILITOT 1.0  PROT 5.5*  ALBUMIN 2.6*   No results for input(s): LIPASE, AMYLASE in the last 168 hours. No results for input(s): AMMONIA in the last 168 hours. Coagulation Profile: Recent Labs  Lab 05/16/18 1049 05/16/18 1520  INR 1.2 1.2   Cardiac Enzymes: No results for input(s): CKTOTAL, CKMB, CKMBINDEX, TROPONINI in the last 168 hours. BNP (last 3 results) No results for input(s): PROBNP in the last 8760 hours. HbA1C: No results for input(s): HGBA1C in the last 72 hours. CBG: No results for input(s): GLUCAP in the last 168 hours. Lipid Profile: No results for input(s): CHOL, HDL, LDLCALC, TRIG, CHOLHDL, LDLDIRECT in the last 72 hours. Thyroid Function Tests: No results for input(s): TSH, T4TOTAL, FREET4, T3FREE, THYROIDAB in the last 72 hours. Anemia Panel: No results for input(s): VITAMINB12, FOLATE, FERRITIN, TIBC, IRON, RETICCTPCT in the last 72 hours. Sepsis Labs: Recent Labs  Lab 05/16/18 1049 05/16/18 1345 05/16/18 1520 05/16/18 1842  LATICACIDVEN 3.1* 4.6* 4.7* 3.0*    Recent Results (from the past 240 hour(s))  Culture, blood (Routine x 2)     Status: None (Preliminary result)   Collection Time: 05/16/18 10:57 AM  Result Value Ref Range Status   Specimen Description   Final    BLOOD RIGHT ANTECUBITAL Performed at [facility], [place] [address], [place], [place] [address]    Special Requests   Final    BOTTLES DRAWN AEROBIC AND ANAEROBIC Blood Culture adequate volume Performed at [place] [address], [place], [place] [address]    Culture   Final    NO GROWTH 3 DAYS Performed at [facility], [place] [address], [place], [place] [address]    Report Status PENDING  Incomplete  Culture, blood (Routine x 2)     Status: None (Preliminary result)   Collection Time: 05/16/18 10:57 AM  Result Value Ref Range Status   Specimen Description   Final    BLOOD LEFT ANTECUBITAL Performed at [facility], [place] [address], [place], [place] [address]    Special Requests   Final    BOTTLES DRAWN AEROBIC AND ANAEROBIC Blood Culture adequate volume Performed at [facility] [address], [place], [place] [address]    Culture   Final    NO GROWTH 3 DAYS Performed at [facility], [place] [address], [place], [place] [address]    Report Status PENDING  Incomplete  Respiratory Panel by PCR     Status: Abnormal   Collection Time: 05/16/18 11:08 AM  Result Value Ref Range Status   Adenovirus NOT DETECTED NOT DETECTED Final   Coronavirus 229E NOT DETECTED NOT DETECTED Final    Comment: (NOTE) The Coronavirus on the Respiratory Panel, DOES NOT test for the novel  Coronavirus (2019 nCoV)    Coronavirus HKU1 NOT DETECTED NOT DETECTED Final   Coronavirus NL63 DETECTED (A) NOT DETECTED Final   Coronavirus OC43 NOT DETECTED NOT DETECTED Final   Metapneumovirus NOT DETECTED NOT DETECTED Final   Rhinovirus / Enterovirus NOT DETECTED NOT DETECTED Final   Influenza A NOT DETECTED NOT DETECTED Final   Influenza B NOT DETECTED NOT DETECTED Final   Parainfluenza Virus 1 NOT DETECTED NOT DETECTED Final   Parainfluenza Virus 2 NOT DETECTED NOT DETECTED Final   Parainfluenza Virus 3 NOT DETECTED NOT DETECTED Final   Parainfluenza Virus 4 NOT DETECTED NOT DETECTED Final   Respiratory Syncytial Virus NOT DETECTED NOT DETECTED Final   Bordetella pertussis NOT DETECTED NOT DETECTED Final   Chlamydophila pneumoniae NOT DETECTED NOT DETECTED Final    Mycoplasma pneumoniae NOT DETECTED NOT DETECTED Final    Comment: Performed at [facility], [place] [address] [place], [place] [address]  Culture, blood (routine x 2) Call MD if unable to obtain prior to antibiotics being given     Status: None (Preliminary result)   Collection Time: 05/16/18  3:19 PM  Result Value Ref Range Status   Specimen Description   Final    BLOOD RIGHT ARM Performed at [facility], [place] [address] [place], [place] [address]    Special Requests   Final    BOTTLES DRAWN AEROBIC AND ANAEROBIC Blood Culture adequate volume Performed at [place] [address], [place], [place] [address]    Culture   Final    NO GROWTH 3 DAYS Performed at [facility], [place] [address], [place], [place] [address]    Report Status PENDING  Incomplete  Culture, blood (routine x 2) Call MD if unable to obtain prior to antibiotics being given     Status: None (Preliminary result)   Collection Time: 05/16/18  3:31 PM  Result Value Ref Range Status   Specimen Description   Final    BLOOD Performed at [facility], [place] [address], [place], [place] [address]    Special Requests   Final    NONE Performed at [facility], [place] [address], [place], [place] [address]    Culture   Final    NO GROWTH 3 DAYS Performed at [facility], [place] [address], [place], [place] [address]    Report Status PENDING  Incomplete         Radiology Studies: Dg Chest Port 1 View  Result Date: 05/19/2018 CLINICAL DATA:  Fever and cough, history of non-small cell lung carcinoma EXAM: PORTABLE CHEST 1 VIEW COMPARISON:  05/18/2018 FINDINGS: Cardiac shadow is within normal limits. Left lung is clear. Right lung demonstrates patchy infiltrates similar to that seen on the prior exam consistent with acute infiltrate. Persistent destructive changes of the left third rib posterolaterally are noted. IMPRESSION: Stable right basilar  infiltrate. Electronically Signed   By: Inez Catalina M.D.   On: 05/19/2018 10:47   Dg Chest [facility]  Result Date: 05/18/2018 CLINICAL DATA:  Shortness of breath. EXAM: PORTABLE CHEST 1 VIEW COMPARISON:  Radiographs of May 16, 2018. CT scan of March 16, 2018. FINDINGS: Stable cardiomediastinal silhouette. No pneumothorax is noted. Increased right basilar opacity is noted concerning for pneumonia or atelectasis with associated pleural effusion. Stable lytic expansile metastatic lesion seen in left third rib. IMPRESSION: Increased right basilar opacity is noted concerning for worsening pneumonia or atelectasis with associated pleural effusion. Stable lytic expansile lesion in left third rib. Electronically Signed   By: Marijo Conception, M.D.   On: 05/18/2018 14:41        Scheduled Meds: . sodium chloride   Intravenous Once  . albuterol  2.5 mg Nebulization BID  . ALPRAZolam  0.5 mg Oral QHS  . atorvastatin  20 mg Oral Daily  . famotidine  20 mg Oral Daily  . metoprolol succinate  25 mg Oral Daily  . mometasone-formoterol  2 puff Inhalation BID  . predniSONE  10 mg Oral Q breakfast  . sodium chloride flush  3 mL Intravenous Once  . traZODone  25 mg Oral QHS   Continuous Infusions:    LOS: 3 days     Cordelia Poche, MD [facility] 05/19/2018, 1:29 PM  If 7PM-7AM, please contact night-coverage [email or web address]

## 2018-05-20 NOTE — TOC Initial Note (Signed)
Transition of Care [facility]) - Initial/Assessment Note    Patient Details  Name: Martin [name] MRN: [id number] Date of Birth: Nov 27, 1930  Transition of Care [facility]) CM/SW Contact:    Nila Nephew, LCSW Phone Number: [phone] 05/20/2018, 3:37 PM  Clinical Narrative:  Pt admitted from [facility] assisted living. Was at [facility] one month ago for rehabilitation following recent hospitalization. Is patient of cancer center, post 2 cycles chemo and currently further chemo on hold he reports as he has had complications post both cycles. Was considering hospice services but feels he would like to continue treatment and try rehabilitation at this point. Would like to return to [facility] at [place] if available. CSW completed FL2/referrals and will follow up.  High Risk readmission screening completed - see below.                  Expected Discharge Plan: Skilled Nursing Facility Barriers to Discharge: SNF Pending bed offer, Continued Medical Work up   Patient Goals and CMS Choice Patient states their goals for this hospitalization and ongoing recovery are:: see if I can rehab (post acute goal) CMS Medicare.gov Compare Post Acute Care list provided to:: Patient(pt requests [facility] specifically)    Expected Discharge Plan and Services Expected Discharge Plan: [place] Choice: [place] arrangements for the past 2 months: [place]                          Prior Living Arrangements/Services Living arrangements for the past 2 months: [place] Lives with:: Facility Resident Patient language and need for interpreter reviewed:: No Do you feel safe going back to the place where you live?: Yes      Need for Family Participation in Patient Care: No (Comment)(family supportive but pt own decision maker) Care giver support system in place?: Yes (comment)(family/ALF)   Criminal  Activity/Legal Involvement Pertinent to Current Situation/Hospitalization: No - Comment as needed  Activities of Daily Living Home Assistive Devices/Equipment: Walker (specify type)(front wheel) ADL Screening (condition at time of admission) Patient's cognitive ability adequate to safely complete daily activities?: Yes Is the patient deaf or have difficulty hearing?: Yes Does the patient have difficulty seeing, even when wearing glasses/contacts?: No(pt uses glasses for reading) Does the patient have difficulty concentrating, remembering, or making decisions?: No Patient able to express need for assistance with ADLs?: Yes Does the patient have difficulty dressing or bathing?: Yes Independently performs ADLs?: No Does the patient have difficulty walking or climbing stairs?: Yes Weakness of Legs: Both Weakness of Arms/Hands: Both  Permission Sought/Granted Permission sought to share information with : Chartered certified accountant granted to share information with : Yes, Verbal Permission Granted     Permission granted to share info w AGENCY: [facility]        Emotional Assessment Appearance:: Appears stated age Attitude/Demeanor/Rapport: Engaged Affect (typically observed): Pleasant Orientation: : Oriented to Self, Oriented to Place, Oriented to  Time, Oriented to Situation Alcohol / Substance Use: Not Applicable Psych Involvement: No (comment)  Admission diagnosis:  Dehydration [E86.0] Hyperglycemia [R73.9] Chemotherapy-induced neutropenia (HCC) [D70.1, T45.1X5A] Sepsis ([place]) [A41.9] Community acquired pneumonia of right lower lobe of lung ([place]) [J18.1] Patient Active Problem List   Diagnosis Date Noted  . Palliative care by specialist   . Generalized weakness   . Right lower lobe pneumonia ([place]) 05/16/2018  . Dehydration   . Diarrhea 04/20/2018  .  Neutropenia ([place]) 04/20/2018  . Goals of care, counseling/discussion 04/06/2018  . Encounter for  antineoplastic chemotherapy 04/06/2018  . Encounter for antineoplastic immunotherapy 04/06/2018  . Bone metastasis ([place]) 03/18/2018  . Metastatic lung cancer (metastasis from lung to other site) ([place]) 03/16/2018  . Insomnia 10/05/2015  . Late effect of stroke 10/05/2015  . Slow transit constipation   . Nausea without vomiting   . Essential hypertension   . Anxiety state   . Cerebellar infarct ([place]) 07/17/2015  . Cerebellar stroke ([place])   . Coronary artery disease involving native coronary artery of native heart without angina pectoris   . Chronic obstructive pulmonary disease ([place])   . CKD (chronic kidney disease) stage 3, GFR 30-59 ml/min (HCC)   . Leukocytosis   . Thrombocytopenia ([place])   . Cerebrovascular accident (CVA) ([place])   . Stroke ([place]) 07/15/2015  . CVA (cerebral infarction) 07/15/2015  . Hypothermia 07/15/2015  . Atherosclerotic PVD with intermittent claudication ([place]) 12/29/2013  . Aftercare following surgery of the circulatory system, [place] 12/23/2012  . Atherosclerosis of native arteries of the extremities with intermittent claudication 06/17/2012  . Diabetes mellitus, type 2 ([place]) 06/11/2012  . Peripheral vascular disease, unspecified ([place]) 05/29/2011  . HLD (hyperlipidemia) 08/14/2009  . MYOCARDIAL INFARCTION 08/14/2009  . Atrial fibrillation ([place]) 08/14/2009  . TOBACCO ABUSE, HX OF 08/14/2009   PCP:  Susy Frizzle, MD Pharmacy:   [place], [place] - [address] [place] [address] Phone: [phone] Fax: [phone]     Social Determinants of Health (SDOH) Interventions    Readmission Risk Interventions 30 Day Unplanned Readmission Risk Score     ED to Hosp-Admission (Current) from 05/16/2018 in [place]  30 Day Unplanned Readmission Risk Score (%)  27 Filed at 05/20/2018 1200     This score is the patient's risk of an unplanned readmission within 30 days of being  discharged (0 -100%). The score is based on dignosis, age, lab data, medications, orders, and past utilization.   Low:  0-14.9   Medium: 15-21.9   High: 22-29.9   Extreme: 30 and above       Readmission Risk Prevention Plan 05/20/2018  Transportation Screening Complete  PCP or Specialist Appt within 3-5 Days (No Data)  [place] or [place] (No Data)  Social Work Consult for [place] Planning/Counseling [place] Screening Complete  Medication Review Press photographer) Complete  Some recent data might be hidden

## 2018-05-20 NOTE — NC FL2 (Signed)
[place] LEVEL OF CARE SCREENING TOOL     IDENTIFICATION  Patient Name: Martin [name] Birthdate: February 03, 1931 Sex: male Admission Date (Current Location): 05/16/2018  [facility] [place] Number:  Herbalist and Address:  [facility],  [place], [place]      Provider Number: [id number]  Attending Physician Name and Address:  Mariel Aloe, MD  Relative Name and Phone Number:       Current Level of Care: Hospital Recommended Level of Care: [place] Prior Approval Number:    Date Approved/Denied:   PASRR Number: [id number]  Discharge Plan: SNF    Current Diagnoses: Patient Active Problem List   Diagnosis Date Noted  . Palliative care by specialist   . Generalized weakness   . Right lower lobe pneumonia ([place]) 05/16/2018  . Dehydration   . Diarrhea 04/20/2018  . Neutropenia ([place]) 04/20/2018  . Goals of care, counseling/discussion 04/06/2018  . Encounter for antineoplastic chemotherapy 04/06/2018  . Encounter for antineoplastic immunotherapy 04/06/2018  . Bone metastasis ([place]) 03/18/2018  . Metastatic lung cancer (metastasis from lung to other site) ([place]) 03/16/2018  . Insomnia 10/05/2015  . Late effect of stroke 10/05/2015  . Slow transit constipation   . Nausea without vomiting   . Essential hypertension   . Anxiety state   . Cerebellar infarct ([place]) 07/17/2015  . Cerebellar stroke ([place])   . Coronary artery disease involving native coronary artery of native heart without angina pectoris   . Chronic obstructive pulmonary disease ([place])   . CKD (chronic kidney disease) stage 3, GFR 30-59 ml/min (HCC)   . Leukocytosis   . Thrombocytopenia ([place])   . Cerebrovascular accident (CVA) ([place])   . Stroke ([place]) 07/15/2015  . CVA (cerebral infarction) 07/15/2015  . Hypothermia 07/15/2015  . Atherosclerotic PVD with intermittent claudication ([place]) 12/29/2013  . Aftercare following surgery of the  circulatory system, [place] 12/23/2012  . Atherosclerosis of native arteries of the extremities with intermittent claudication 06/17/2012  . Diabetes mellitus, type 2 ([place]) 06/11/2012  . Peripheral vascular disease, unspecified ([place]) 05/29/2011  . HLD (hyperlipidemia) 08/14/2009  . MYOCARDIAL INFARCTION 08/14/2009  . Atrial fibrillation ([place]) 08/14/2009  . TOBACCO ABUSE, HX OF 08/14/2009    Orientation RESPIRATION BLADDER Height & Weight     Self, Time, Situation, Place  O2(4L) Continent Weight: 140 lb (63.5 kg) Height:  5\' 6"  (167.6 cm)  BEHAVIORAL SYMPTOMS/MOOD NEUROLOGICAL BOWEL NUTRITION STATUS      Continent Diet(regular diet)  AMBULATORY STATUS COMMUNICATION OF NEEDS Skin   Extensive Assist Verbally Normal                       Personal Care Assistance Level of Assistance  Bathing, Feeding, Dressing Bathing Assistance: Maximum assistance Feeding assistance: Independent Dressing Assistance: Limited assistance     Functional Limitations Info  Hearing, Speech, Sight Sight Info: Adequate Hearing Info: Adequate Speech Info: Adequate    SPECIAL CARE FACTORS FREQUENCY  PT (By licensed PT), OT (By licensed OT)     PT Frequency: 5x OT Frequency: 5x            Contractures Contractures Info: Not present    Additional Factors Info  Code Status, Allergies, Isolation Precautions Code Status Info: DNR Allergies Info: Penicillins     Isolation Precautions Info: droplet precautions (while inpatient, see DC summary for updated precaution order)     Current Medications (05/20/2018):  This is the current hospital active  medication list Current Facility-Administered Medications  Medication Dose Route Frequency Provider Last Rate Last Dose  . 0.9 %  sodium chloride infusion (Manually program via Guardrails IV Fluids)   Intravenous Once Mariel Aloe, MD      . albuterol (PROVENTIL) (2.5 MG/3ML) 0.083% nebulizer solution 2.5 mg  2.5 mg Nebulization Q4H PRN Mariel Aloe, MD      . albuterol (PROVENTIL) (2.5 MG/3ML) 0.083% nebulizer solution 2.5 mg  2.5 mg Nebulization BID Mariel Aloe, MD   2.5 mg at 05/20/18 1053  . ALPRAZolam Duanne Moron) tablet 0.5 mg  0.5 mg Oral QHS Swayze, Ava, DO   0.5 mg at 05/19/18 2218  . alum & mag hydroxide-simeth (MAALOX/MYLANTA) 200-200-20 MG/5ML suspension 30 mL  30 mL Oral Q4H PRN Swayze, Ava, DO   30 mL at 05/16/18 2111  . atorvastatin (LIPITOR) tablet 20 mg  20 mg Oral Daily Swayze, Ava, DO   20 mg at 05/20/18 0950  . famotidine (PEPCID) tablet 20 mg  20 mg Oral Daily Swayze, Ava, DO   20 mg at 05/20/18 0950  . HYDROcodone-acetaminophen (NORCO/VICODIN) 5-325 MG per tablet 1 tablet  1 tablet Oral Q6H PRN Swayze, Ava, DO   1 tablet at 05/18/18 2121  . loperamide (IMODIUM) capsule 2 mg  2 mg Oral TID PRN Swayze, Ava, DO      . metoprolol succinate (TOPROL-XL) 24 hr tablet 25 mg  25 mg Oral Daily Swayze, Ava, DO   25 mg at 05/20/18 0950  . mometasone-formoterol (DULERA) 200-5 MCG/ACT inhaler 2 puff  2 puff Inhalation BID Swayze, Ava, DO   2 puff at 05/20/18 1053  . nitroGLYCERIN (NITROSTAT) SL tablet 0.4 mg  0.4 mg Sublingual Q5 min PRN Swayze, Ava, DO      . ondansetron (ZOFRAN-ODT) disintegrating tablet 4 mg  4 mg Oral Q6H PRN Mariel Aloe, MD   4 mg at 05/20/18 1353  . polyethylene glycol (MIRALAX / GLYCOLAX) packet 17 g  17 g Oral Daily PRN Swayze, Ava, DO      . predniSONE (DELTASONE) tablet 10 mg  10 mg Oral Q breakfast Swayze, Ava, DO   10 mg at 05/20/18 0950  . sodium chloride flush (NS) 0.9 % injection 3 mL  3 mL Intravenous Once Ripley Fraise, MD      . traZODone (DESYREL) tablet 25 mg  25 mg Oral QHS Swayze, Ava, DO   25 mg at 05/19/18 2218     Discharge Medications: Please see discharge summary for a list of discharge medications.  Relevant Imaging Results:  Relevant Lab Results:   Additional Information SSN: [id number]  Nila Nephew, LCSW

## 2018-05-20 NOTE — Progress Notes (Signed)
Physical Therapy Treatment Patient Details Name: Martin [name] MRN: [id number] DOB: October 30, 1930 Today's Date: 05/20/2018    History of Present Illness 84 yo male admitted with Pna. Hx of lung ca with mets, CVA, PVD, CAD, COPD, DM, MI    PT Comments    Progressing with mobility. Pt is motivated to regain his strength and independence. Discussed d/c plan-he would like to go rehab if possible. Will continue to follow.    Follow Up Recommendations  SNF     Equipment Recommendations  None recommended by PT    Recommendations for Other Services       Precautions / Restrictions Precautions Precautions: Fall Precaution Comments: on chemo (pt has currently elected to stop any further txs) Restrictions Weight Bearing Restrictions: No    Mobility  Bed Mobility Overal bed mobility: Needs Assistance Bed Mobility: Supine to Sit;Sit to Supine     Supine to sit: Min guard;HOB elevated Sit to supine: Min guard;HOB elevated   General bed mobility comments: Increaased time. Reliance on bedrail.   Transfers Overall transfer level: Needs assistance Equipment used: Rolling walker (2 wheeled) Transfers: Sit to/from Stand Sit to Stand: Min assist         General transfer comment: x2. VCs safety, technique, hand placement. Assist to rise, stabilize, control descent.   Ambulation/Gait Ambulation/Gait assistance: Min assist Gait Distance (Feet): 50 Feet(30'x1) Assistive device: Rolling walker (2 wheeled) Gait Pattern/deviations: Step-through pattern;Decreased stride length     General Gait Details: Assist to stabilize pt throughout distance. Pt fatigues fairly easily. dyspnea 2/4. O2 >90% on RA. Walked x 2 (small laps around the room)   Stairs             Wheelchair Mobility    Modified Rankin (Stroke Patients Only)       Balance Overall balance assessment: Needs assistance;History of Falls         Standing balance support: Bilateral upper extremity  supported Standing balance-Leahy Scale: Poor                              Cognition Arousal/Alertness: Awake/alert Behavior During Therapy: WFL for tasks assessed/performed Overall Cognitive Status: Within Functional Limits for tasks assessed                                        Exercises      General Comments        Pertinent Vitals/Pain Pain Assessment: No/denies pain    Home Living                      Prior Function            PT Goals (current goals can now be found in the care plan section) Progress towards PT goals: Progressing toward goals    Frequency    Min 2X/week      PT Plan Discharge plan needs to be updated;Frequency needs to be updated    Co-evaluation              AM-PAC PT "6 Clicks" Mobility   Outcome Measure  Help needed turning from your back to your side while in a flat bed without using bedrails?: A Little Help needed moving from lying on your back to sitting on the side of a flat bed without using bedrails?: A Little Help needed  moving to and from a bed to a chair (including a wheelchair)?: A Little Help needed standing up from a chair using your arms (e.g., wheelchair or bedside chair)?: A Little Help needed to walk in hospital room?: A Little Help needed climbing 3-5 steps with a railing? : A Lot 6 Click Score: 17    End of Session Equipment Utilized During Treatment: Gait belt Activity Tolerance: Patient limited by fatigue Patient left: in bed;with call bell/phone within reach;with bed alarm set   PT Visit Diagnosis: Unsteadiness on feet (R26.81);Muscle weakness (generalized) (M62.81);Difficulty in walking, not elsewhere classified (R26.2)     Time: 6962-9528 PT Time Calculation (min) (ACUTE ONLY): 23 min  Charges:  $Gait Training: 23-37 mins                        Weston Anna, PT Acute Rehabilitation Services Pager: [phone] Office: [phone]

## 2018-05-20 NOTE — Progress Notes (Signed)
PROGRESS NOTE    Martin [name]  OXB:[id number] DOB: [birth year] DOA: 05/16/2018 PCP: Susy Frizzle, MD   Brief Narrative: Martin [name] is a 85 y.o. male with a history of [place] for which he is receiving chemotherapy. He also has CKD, COPD, DM II, GERD, Hiatal hernia, hyperlipidemia, hypertension, CAD with history of MI x 2, PVD, history of stroke and renal artery stenosis.    Assessment & Plan:   Principal Problem:   Right lower lobe pneumonia (HCC) Active Problems:   Atrial fibrillation ([place])   Diabetes mellitus, type 2 ([place])   Coronary artery disease involving native coronary artery of native heart without angina pectoris   Chronic obstructive pulmonary disease (HCC)   CKD (chronic kidney disease) stage 3, GFR 30-59 ml/min (HCC)   Essential hypertension   Anxiety state   Metastatic lung cancer (metastasis from lung to other site) ([place])   Bone metastasis (HCC)   Neutropenia (HCC)   Palliative care by specialist   Generalized weakness   Right lower lobe pneumonia Associated cough and in setting of neutropenia and overall pancytopenia. RVP significant for coronavirus NL63. Discontinue antibiotics in setting of negative blood cultures and positive RVP. Repeat chest x-ray significant for infiltrate without evidence of pulmonary edema. -Wean to room air today -Albuterol nebulizer treatment  Pancytopenia New. Significant leukopenia. New macrocytosis. Likely secondary to recent chemotherapy. Afebrile. No bleeding currently. Received Neulasta on 3/4 per discussion with oncology. S/p 1 unit of PRBC with improvement of hemoglobin from 7.4 to 9.5. WBC and platelets also improving. -Discontinued Plavix and Heparin -Oncology: if febrile, recommend Granix  Metastatic lung cancer Bone metastasis. Currently on chemotherapy. Previously received radiation. Currently states he is done with chemotherapy. Discussed this with oncologist who agrees with palliative care consult for goals of  care going forward. Upon discussion initially with palliative care, plan was discharge with hospice. However, patient now wishes to attempt rehabilitation at a SNF. He will decide on hospice afterwards. He is hoping to get back to previous baseline prior to chemotherapy.  CKD stage III Stable.  CAD On Plavix -Hold plavix secondary to thrombocytopenia   DVT prophylaxis: SCDs Code Status:   Code Status: DNR Family Communication: None at bedside Disposition Plan: Discharge to SNF once off oxygen   Consultants:   Oncology  Palliative care  Procedures:   None  Antimicrobials:  Vancomycin  Aztreonam    Subjective: Some shortness of breath today. No other issues.  Objective: Vitals:   05/19/18 2036 05/19/18 2121 05/20/18 0540 05/20/18 1054  BP: 113/66  121/63   Pulse: 80  67   Resp: 14  15   Temp: 98.7 F (37.1 C)  98.3 F (36.8 C)   TempSrc: Oral  Oral   SpO2: 96% 96% 100% 94%  Weight:      Height:        Intake/Output Summary (Last 24 hours) at 05/20/2018 1315 Last data filed at 05/20/2018 1100 Gross per 24 hour  Intake 240 ml  Output 1075 ml  Net -835 ml   Filed Weights   05/16/18 1044 05/16/18 1503  Weight: 62 kg 63.5 kg    Examination:  General exam: Appears calm and comfortable Respiratory system: Diminished. Do not hear any wheezing today. Respiratory effort normal. Cardiovascular system: S1 & S2 heard, RRR. No murmurs, rubs, gallops or clicks. Gastrointestinal system: Abdomen is nondistended, soft and nontender. No organomegaly or masses felt. Normal bowel sounds heard. Central nervous system: Alert and oriented. No focal neurological deficits.  Extremities: No edema. No calf tenderness Skin: No cyanosis. No rashes Psychiatry: Judgement and insight appear normal. Mood & affect appropriate.     Data Reviewed: I have personally reviewed following labs and imaging studies  CBC: Recent Labs  Lab 05/16/18 1049 05/17/18 0441 05/19/18 1352    WBC 0.3* 0.3* 7.2  NEUTROABS TOO FEW TO COUNT, SMEAR AVAILABLE FOR REVIEW  --   --   HGB 10.1* 7.4* 9.5*  HCT 32.6* 24.7* 29.6*  MCV 102.5* 105.1* 99.7  PLT 52* 44* 77*   Basic Metabolic Panel: Recent Labs  Lab 05/16/18 1049 05/16/18 1531 05/17/18 0441 05/18/18 0514 05/19/18 0335  NA 134*  --  136  --   --   K 4.2  --  3.6  --   --   CL 102  --  112*  --   --   CO2 24  --  18*  --   --   GLUCOSE 274*  --  158*  --   --   BUN 30*  --  22  --   --   CREATININE 1.15 1.09 0.94 0.97 0.99  CALCIUM 8.2*  --  6.9*  --   --    GFR: Estimated Creatinine Clearance: 49 mL/min (by C-G formula based on SCr of 0.99 mg/dL). Liver Function Tests: Recent Labs  Lab 05/16/18 1049  AST 14*  ALT 13  ALKPHOS 83  BILITOT 1.0  PROT 5.5*  ALBUMIN 2.6*   No results for input(s): LIPASE, AMYLASE in the last 168 hours. No results for input(s): AMMONIA in the last 168 hours. Coagulation Profile: Recent Labs  Lab 05/16/18 1049 05/16/18 1520  INR 1.2 1.2   Cardiac Enzymes: No results for input(s): CKTOTAL, CKMB, CKMBINDEX, TROPONINI in the last 168 hours. BNP (last 3 results) No results for input(s): PROBNP in the last 8760 hours. HbA1C: No results for input(s): HGBA1C in the last 72 hours. CBG: No results for input(s): GLUCAP in the last 168 hours. Lipid Profile: No results for input(s): CHOL, HDL, LDLCALC, TRIG, CHOLHDL, LDLDIRECT in the last 72 hours. Thyroid Function Tests: No results for input(s): TSH, T4TOTAL, FREET4, T3FREE, THYROIDAB in the last 72 hours. Anemia Panel: No results for input(s): VITAMINB12, FOLATE, FERRITIN, TIBC, IRON, RETICCTPCT in the last 72 hours. Sepsis Labs: Recent Labs  Lab 05/16/18 1049 05/16/18 1345 05/16/18 1520 05/16/18 1842  LATICACIDVEN 3.1* 4.6* 4.7* 3.0*    Recent Results (from the past 240 hour(s))  Culture, blood (Routine x 2)     Status: None (Preliminary result)   Collection Time: 05/16/18 10:57 AM  Result Value Ref Range Status    Specimen Description   Final    BLOOD RIGHT ANTECUBITAL Performed at [facility], [place] [address] [place], [place] [address]    Special Requests   Final    BOTTLES DRAWN AEROBIC AND ANAEROBIC Blood Culture adequate volume Performed at [facility] [address], [place], [place] [address]    Culture   Final    NO GROWTH 4 DAYS Performed at [facility], [place] [address], [place], [place] [address]    Report Status PENDING  Incomplete  Culture, blood (Routine x 2)     Status: None (Preliminary result)   Collection Time: 05/16/18 10:57 AM  Result Value Ref Range Status   Specimen Description   Final    BLOOD LEFT ANTECUBITAL Performed at [facility], [place] [address] [place], [place] [address]  Special Requests   Final    BOTTLES DRAWN AEROBIC AND ANAEROBIC Blood Culture adequate volume Performed at [place] [address] [place], [place] [address]    Culture   Final    NO GROWTH 4 DAYS Performed at [facility], [place] [address] [place], [place] [address]    Report Status PENDING  Incomplete  Respiratory Panel by PCR     Status: Abnormal   Collection Time: 05/16/18 11:08 AM  Result Value Ref Range Status   Adenovirus NOT DETECTED NOT DETECTED Final   Coronavirus 229E NOT DETECTED NOT DETECTED Final    Comment: (NOTE) The Coronavirus on the Respiratory Panel, DOES NOT test for the novel  Coronavirus (2019 nCoV)    Coronavirus HKU1 NOT DETECTED NOT DETECTED Final   Coronavirus NL63 DETECTED (A) NOT DETECTED Final   Coronavirus OC43 NOT DETECTED NOT DETECTED Final   Metapneumovirus NOT DETECTED NOT DETECTED Final   Rhinovirus / Enterovirus NOT DETECTED NOT DETECTED Final   Influenza A NOT DETECTED NOT DETECTED Final   Influenza B NOT DETECTED NOT DETECTED Final   Parainfluenza Virus 1 NOT DETECTED NOT DETECTED Final   Parainfluenza Virus 2 NOT DETECTED NOT DETECTED Final   Parainfluenza Virus 3 NOT  DETECTED NOT DETECTED Final   Parainfluenza Virus 4 NOT DETECTED NOT DETECTED Final   Respiratory Syncytial Virus NOT DETECTED NOT DETECTED Final   Bordetella pertussis NOT DETECTED NOT DETECTED Final   Chlamydophila pneumoniae NOT DETECTED NOT DETECTED Final   Mycoplasma pneumoniae NOT DETECTED NOT DETECTED Final    Comment: Performed at [facility], [place] [address] [place], [place] [address]  Culture, blood (routine x 2) Call MD if unable to obtain prior to antibiotics being given     Status: None (Preliminary result)   Collection Time: 05/16/18  3:19 PM  Result Value Ref Range Status   Specimen Description   Final    BLOOD RIGHT ARM Performed at [facility], [place] [address] [place], [place] [address]    Special Requests   Final    BOTTLES DRAWN AEROBIC AND ANAEROBIC Blood Culture adequate volume Performed at [place] [address], [place], [place] [address]    Culture   Final    NO GROWTH 4 DAYS Performed at [facility], [place] [address] [place], [place] [address]    Report Status PENDING  Incomplete  Culture, blood (routine x 2) Call MD if unable to obtain prior to antibiotics being given     Status: None (Preliminary result)   Collection Time: 05/16/18  3:31 PM  Result Value Ref Range Status   Specimen Description   Final    BLOOD Performed at [facility] [place] [facility], [place] [address], [place], [place] [address]    Special Requests   Final    NONE Performed at [facility], [place] [address], [place], [place] [address]    Culture   Final    NO GROWTH 4 DAYS Performed at [facility], [place] [address], [place], [place] [address]    Report Status PENDING  Incomplete         Radiology Studies: Dg Chest Port 1 View  Result Date: 05/19/2018 CLINICAL DATA:  Fever and cough, history of non-small cell lung carcinoma EXAM: PORTABLE CHEST 1 VIEW COMPARISON:  05/18/2018 FINDINGS: Cardiac shadow is within  normal limits. Left lung is clear. Right lung demonstrates patchy infiltrates similar to  that seen on the prior exam consistent with acute infiltrate. Persistent destructive changes of the left third rib posterolaterally are noted. IMPRESSION: Stable right basilar infiltrate. Electronically Signed   By: Inez Catalina M.D.   On: 05/19/2018 10:47   Dg Chest Port 1 View  Result Date: 05/18/2018 CLINICAL DATA:  Shortness of breath. EXAM: PORTABLE CHEST 1 VIEW COMPARISON:  Radiographs of May 16, 2018. CT scan of March 16, 2018. FINDINGS: Stable cardiomediastinal silhouette. No pneumothorax is noted. Increased right basilar opacity is noted concerning for pneumonia or atelectasis with associated pleural effusion. Stable lytic expansile metastatic lesion seen in left third rib. IMPRESSION: Increased right basilar opacity is noted concerning for worsening pneumonia or atelectasis with associated pleural effusion. Stable lytic expansile lesion in left third rib. Electronically Signed   By: Marijo Conception, M.D.   On: 05/18/2018 14:41        Scheduled Meds:  sodium chloride   Intravenous Once   albuterol  2.5 mg Nebulization BID   ALPRAZolam  0.5 mg Oral QHS   atorvastatin  20 mg Oral Daily   famotidine  20 mg Oral Daily   metoprolol succinate  25 mg Oral Daily   mometasone-formoterol  2 puff Inhalation BID   predniSONE  10 mg Oral Q breakfast   sodium chloride flush  3 mL Intravenous Once   traZODone  25 mg Oral QHS   Continuous Infusions:    LOS: 4 days     Cordelia Poche, MD [facility] 05/20/2018, 1:15 PM  If 7PM-7AM, please contact night-coverage [email or web address]

## 2018-05-21 DIAGNOSIS — F411 Generalized anxiety disorder: Secondary | ICD-10-CM | POA: Diagnosis not present

## 2018-05-21 DIAGNOSIS — M6281 Muscle weakness (generalized): Secondary | ICD-10-CM | POA: Diagnosis not present

## 2018-05-21 DIAGNOSIS — R5381 Other malaise: Secondary | ICD-10-CM | POA: Diagnosis not present

## 2018-05-21 DIAGNOSIS — B342 Coronavirus infection, unspecified: Secondary | ICD-10-CM | POA: Diagnosis not present

## 2018-05-21 DIAGNOSIS — F29 Unspecified psychosis not due to a substance or known physiological condition: Secondary | ICD-10-CM | POA: Diagnosis not present

## 2018-05-21 DIAGNOSIS — N183 Chronic kidney disease, stage 3 (moderate): Secondary | ICD-10-CM | POA: Diagnosis not present

## 2018-05-21 DIAGNOSIS — D701 Agranulocytosis secondary to cancer chemotherapy: Secondary | ICD-10-CM | POA: Diagnosis not present

## 2018-05-21 DIAGNOSIS — J129 Viral pneumonia, unspecified: Secondary | ICD-10-CM | POA: Diagnosis not present

## 2018-05-21 DIAGNOSIS — J181 Lobar pneumonia, unspecified organism: Secondary | ICD-10-CM | POA: Diagnosis not present

## 2018-05-21 DIAGNOSIS — J9601 Acute respiratory failure with hypoxia: Secondary | ICD-10-CM | POA: Diagnosis not present

## 2018-05-21 DIAGNOSIS — M2550 Pain in unspecified joint: Secondary | ICD-10-CM | POA: Diagnosis not present

## 2018-05-21 DIAGNOSIS — C3490 Malignant neoplasm of unspecified part of unspecified bronchus or lung: Secondary | ICD-10-CM | POA: Diagnosis not present

## 2018-05-21 DIAGNOSIS — E1165 Type 2 diabetes mellitus with hyperglycemia: Secondary | ICD-10-CM | POA: Diagnosis not present

## 2018-05-21 DIAGNOSIS — D61810 Antineoplastic chemotherapy induced pancytopenia: Secondary | ICD-10-CM | POA: Diagnosis not present

## 2018-05-21 DIAGNOSIS — E785 Hyperlipidemia, unspecified: Secondary | ICD-10-CM | POA: Diagnosis not present

## 2018-05-21 DIAGNOSIS — C7951 Secondary malignant neoplasm of bone: Secondary | ICD-10-CM | POA: Diagnosis not present

## 2018-05-21 DIAGNOSIS — Z880 Allergy status to penicillin: Secondary | ICD-10-CM | POA: Diagnosis not present

## 2018-05-21 DIAGNOSIS — J189 Pneumonia, unspecified organism: Secondary | ICD-10-CM | POA: Diagnosis not present

## 2018-05-21 DIAGNOSIS — R05 Cough: Secondary | ICD-10-CM | POA: Diagnosis not present

## 2018-05-21 DIAGNOSIS — J449 Chronic obstructive pulmonary disease, unspecified: Secondary | ICD-10-CM | POA: Diagnosis not present

## 2018-05-21 DIAGNOSIS — I480 Paroxysmal atrial fibrillation: Secondary | ICD-10-CM | POA: Diagnosis not present

## 2018-05-21 DIAGNOSIS — Z87891 Personal history of nicotine dependence: Secondary | ICD-10-CM | POA: Diagnosis not present

## 2018-05-21 DIAGNOSIS — D696 Thrombocytopenia, unspecified: Secondary | ICD-10-CM | POA: Diagnosis not present

## 2018-05-21 DIAGNOSIS — I4891 Unspecified atrial fibrillation: Secondary | ICD-10-CM | POA: Diagnosis not present

## 2018-05-21 DIAGNOSIS — I219 Acute myocardial infarction, unspecified: Secondary | ICD-10-CM | POA: Diagnosis not present

## 2018-05-21 DIAGNOSIS — I70219 Atherosclerosis of native arteries of extremities with intermittent claudication, unspecified extremity: Secondary | ICD-10-CM | POA: Diagnosis not present

## 2018-05-21 DIAGNOSIS — E1122 Type 2 diabetes mellitus with diabetic chronic kidney disease: Secondary | ICD-10-CM | POA: Diagnosis not present

## 2018-05-21 DIAGNOSIS — I6930 Unspecified sequelae of cerebral infarction: Secondary | ICD-10-CM | POA: Diagnosis not present

## 2018-05-21 DIAGNOSIS — I739 Peripheral vascular disease, unspecified: Secondary | ICD-10-CM | POA: Diagnosis not present

## 2018-05-21 DIAGNOSIS — I10 Essential (primary) hypertension: Secondary | ICD-10-CM | POA: Diagnosis not present

## 2018-05-21 DIAGNOSIS — Z7401 Bed confinement status: Secondary | ICD-10-CM | POA: Diagnosis not present

## 2018-05-21 DIAGNOSIS — I2510 Atherosclerotic heart disease of native coronary artery without angina pectoris: Secondary | ICD-10-CM | POA: Diagnosis not present

## 2018-05-21 LAB — CULTURE, BLOOD (ROUTINE X 2)
Culture: NO GROWTH
Culture: NO GROWTH
Culture: NO GROWTH
Culture: NO GROWTH
Special Requests: ADEQUATE
Special Requests: ADEQUATE
Special Requests: ADEQUATE

## 2018-05-21 LAB — EXPECTORATED SPUTUM ASSESSMENT W REFEX TO RESP CULTURE

## 2018-05-21 LAB — EXPECTORATED SPUTUM ASSESSMENT W GRAM STAIN, RFLX TO RESP C

## 2018-05-21 MED ORDER — ALPRAZOLAM 0.5 MG PO TABS: 0.5000 mg | ORAL_TABLET | Freq: Every day | ORAL | 0 refills | Status: DC

## 2018-05-21 MED ORDER — HYDROCODONE-ACETAMINOPHEN 5-325 MG PO TABS: 1.0000 | ORAL_TABLET | Freq: Four times a day (QID) | ORAL | 0 refills | Status: DC | PRN

## 2018-05-21 MED ORDER — ONDANSETRON HCL 4 MG PO TABS
4.0000 mg | ORAL_TABLET | Freq: Once | ORAL | Status: AC
  Administered 2018-05-21: 4 mg via ORAL
  Filled 2018-05-21: qty 1

## 2018-05-21 NOTE — Progress Notes (Signed)
Alert and oriented without complaint.   Left with [facility] for [employer].  Shoes, jacket and tissues gone with him.

## 2018-05-21 NOTE — Consult Note (Signed)
   [facility] Inpatient Consult   05/21/2018  Martin [name] 04-15-1930 [id number]  Patient chart has been reviewed for readmissions less than 30 days and for high risk score, 27%, for unplanned readmissions.  Patient assessed for community [facility] Management follow up needs.    Chart review reveals patient current disposition plan is for SNF.  No [facility] Care Management needs at this time.    Netta Cedars, MSN, [facility] Liaison Nurse Mobile Phone [phone]  Toll free office [phone]

## 2018-05-21 NOTE — Progress Notes (Signed)
Gave report to [employer] from [employer]. All questions answered

## 2018-05-21 NOTE — Discharge Summary (Addendum)
Physician Discharge Summary  Martin [name] OAC:[id number] DOB: 06/18/1930 DOA: 05/16/2018  PCP: Susy Frizzle, MD  Admit date: 05/16/2018 Discharge date: 05/21/2018  Admitted From: Home Disposition:  Home  Discharge Condition:Stable CODE STATUS:DNR Diet recommendation: Heart Healthy   Brief/Interim Summary: Martin [name] is a 85 y.o. male with a history of NSCLCA for which he is receiving chemotherapy. He also has CKD, COPD, DM II, GERD, Hiatal hernia, hyperlipidemia, hypertension, CAD with history of MI x 2, PVD, history of stroke and renal artery stenosis. He was Admitted for shortness of breath, generalized weakness that was going on for a week.  Chest x-ray showed right lower lobe pneumonia.  Respiratory virus panel significant for coronavirus and NL63(Not COVID-19).  He was treated for viral pneumonia.  Antibiotics were discontinued.  Currently his respiratory status stable.  Saturating fine on room air.  He is stable for discharge to skilled nursing facility today.  No need to follow any precautions for him at skilled nursing facility.  Following problems were addressed during his hospitalization:  Right lower lobe pneumonia Associated cough and in setting of neutropenia and overall pancytopenia. RVP significant for coronavirus NL63. Discontinue antibiotics in setting of negative blood cultures and positive RVP. Repeat chest x-ray significant for infiltrate without evidence of pulmonary edema. Weaned to room air .  Respiratory status stable  Pancytopenia Likely secondary to recent chemotherapy. Afebrile. No bleeding currently. Received Neulasta on 3/4 per discussion with oncology. S/p 1 unit of PRBC with improvement of hemoglobin from 7.4 to 9.5. WBC and platelets also improved. Check CBC in a week.  Metastatic lung cancer Bone metastasis. Currently on chemotherapy. Previously received radiation. Currently states he is done with chemotherapy. Discussed this with oncologist who  agrees with palliative care consult for goals of care going forward. Upon discussion initially with palliative care, plan was discharge with hospice. However, patient now wishes to attempt rehabilitation at a SNF. He will decide on hospice afterwards. He is hoping to get back to previous baseline prior to chemotherapy.  CKD stage III Stable.  CAD On Plavix Hold plavix secondary to thrombocytopenia Can restart in the future with improvement in platelets level.    Discharge Diagnoses:  Principal Problem:   Right lower lobe pneumonia ([place]) Active Problems:   Atrial fibrillation ([place])   Diabetes mellitus, type 2 ([place])   Coronary artery disease involving native coronary artery of native heart without angina pectoris   Chronic obstructive pulmonary disease (HCC)   CKD (chronic kidney disease) stage 3, GFR 30-59 ml/min (HCC)   Essential hypertension   Anxiety state   Metastatic lung cancer (metastasis from lung to other site) ([place])   Bone metastasis (HCC)   Neutropenia ([place])   Palliative care by specialist   Generalized weakness    Discharge Instructions  Discharge Instructions    Diet - low sodium heart healthy   Complete by:  As directed    Discharge instructions   Complete by:  As directed    1)Check CBC and Bmp in a week.   Increase activity slowly   Complete by:  As directed      Allergies as of 05/21/2018      Reactions   Penicillins Rash   DID THE REACTION INVOLVE: Swelling of the face/tongue/throat, SOB, or low BP? Yes Sudden or severe rash/hives, skin peeling, or the inside of the mouth or nose? No Did it require medical treatment? Was already in hosp When did it last happen?40 years ago If all above  answers are "NO", may proceed with cephalosporin use.      Medication List    STOP taking these medications   traMADol 50 MG tablet Commonly known as:  ULTRAM     TAKE these medications   ALPRAZolam 0.5 MG tablet Commonly known as:  XANAX Take 1 tablet  (0.5 mg total) by mouth at bedtime.   atorvastatin 20 MG tablet Commonly known as:  LIPITOR TAKE 1 TABLET BY MOUTH ONCE DAILY AT  6PM. What changed:  See the new instructions.   budesonide-formoterol 160-4.5 MCG/ACT inhaler Commonly known as:  SYMBICORT Inhale 2 puffs into the lungs 2 (two) times daily as needed (for COPD flares).   clopidogrel 75 MG tablet Commonly known as:  PLAVIX Take 1 tablet (75 mg total) by mouth daily.   famotidine 20 MG tablet Commonly known as:  PEPCID TAKE 1 TABLET BY MOUTH TWICE DAILY What changed:  Another medication with the same name was removed. Continue taking this medication, and follow the directions you see here.   HYDROcodone-acetaminophen 5-325 MG tablet Commonly known as:  NORCO/VICODIN Take 1 tablet by mouth every 6 (six) hours as needed for moderate pain.   loperamide 2 MG capsule Commonly known as:  IMODIUM Take 1 capsule (2 mg total) by mouth 3 (three) times daily as needed for diarrhea or loose stools.   metoprolol succinate 25 MG 24 hr tablet Commonly known as:  TOPROL-XL TAKE 1 TABLET BY MOUTH ONCE DAILY   Nitrostat 0.4 MG SL tablet Generic drug:  nitroGLYCERIN DISSOLVE ONE TABLET UNDER THE TONGUE AS DIRECTED What changed:  See the new instructions.   ondansetron 4 MG disintegrating tablet Commonly known as:  Zofran ODT Take 1 tablet (4 mg total) by mouth every 8 (eight) hours as needed for nausea or vomiting.   polyethylene glycol packet Commonly known as:  MIRALAX / GLYCOLAX Take 17 g by mouth daily as needed for moderate constipation or severe constipation.   predniSONE 10 MG tablet Commonly known as:  DELTASONE Take 10 mg by mouth daily with breakfast.   traZODone 25 mg Tabs tablet Commonly known as:  DESYREL Take 25 mg by mouth at bedtime.       Allergies  Allergen Reactions  . Penicillins Rash    DID THE REACTION INVOLVE: Swelling of the face/tongue/throat, SOB, or low BP? Yes Sudden or severe rash/hives,  skin peeling, or the inside of the mouth or nose? No Did it require medical treatment? Was already in hosp When did it last happen?40 years ago If all above answers are "NO", may proceed with cephalosporin use.     Consultations: None  Procedures/Studies: Dg Chest 2 View  Result Date: 05/16/2018 CLINICAL DATA:  Metastatic lung cancer, shortness of breath EXAM: CHEST - 2 VIEW COMPARISON:  03/18/2018, 04/01/2018 FINDINGS: Patchy right lower lung airspace process concerning for right basilar pneumonia or aspiration, new since 03/18/2018. Left lung remains clear. Ill-defined known right apical mass is less apparent on the prior study. Enlargement of the left fourth rib metastasis noted. Normal heart size and vascularity. Trachea midline. Aorta atherosclerotic. IMPRESSION: New right basilar patchy airspace process compatible with pneumonia or aspiration Known right apical mass is better demonstrated by PET-CT Enlargement of the left fourth rib destructive metastasis Electronically Signed   By: Jerilynn Mages.  Shick M.D.   On: 05/16/2018 12:19   Dg Chest Port 1 View  Result Date: 05/19/2018 CLINICAL DATA:  Fever and cough, history of non-small cell lung carcinoma EXAM: PORTABLE CHEST 1  VIEW COMPARISON:  05/18/2018 FINDINGS: Cardiac shadow is within normal limits. Left lung is clear. Right lung demonstrates patchy infiltrates similar to that seen on the prior exam consistent with acute infiltrate. Persistent destructive changes of the left third rib posterolaterally are noted. IMPRESSION: Stable right basilar infiltrate. Electronically Signed   By: Inez Catalina M.D.   On: 05/19/2018 10:47   Dg Chest Port 1 View  Result Date: 05/18/2018 CLINICAL DATA:  Shortness of breath. EXAM: PORTABLE CHEST 1 VIEW COMPARISON:  Radiographs of May 16, 2018. CT scan of March 16, 2018. FINDINGS: Stable cardiomediastinal silhouette. No pneumothorax is noted. Increased right basilar opacity is noted concerning for pneumonia or  atelectasis with associated pleural effusion. Stable lytic expansile metastatic lesion seen in left third rib. IMPRESSION: Increased right basilar opacity is noted concerning for worsening pneumonia or atelectasis with associated pleural effusion. Stable lytic expansile lesion in left third rib. Electronically Signed   By: Marijo Conception, M.D.   On: 05/18/2018 14:41       Subjective: Patient seen and examined the bedside this morning.  Hemodynamically stable.  Comfortable.  On room air.  Respiratory status stable for discharge.  Discharge Exam: Vitals:   05/21/18 0623 05/21/18 1323  BP: (!) 153/75 (!) 155/72  Pulse: 86 93  Resp: 16 18  Temp: 99 F (37.2 C) 98.8 F (37.1 C)  SpO2: 95% 95%   Vitals:   05/20/18 2059 05/21/18 0056 05/21/18 0623 05/21/18 1323  BP:  (!) 124/57 (!) 153/75 (!) 155/72  Pulse:  84 86 93  Resp:  16 16 18   Temp:  99.4 F (37.4 C) 99 F (37.2 C) 98.8 F (37.1 C)  TempSrc:  Oral Oral Oral  SpO2: 97% 98% 95% 95%  Weight:      Height:        General: Pt is alert, awake, not in acute distress Cardiovascular: RRR, S1/S2 +, no rubs, no gallops Respiratory: CTA bilaterally, no wheezing, no rhonchi Abdominal: Soft, NT, ND, bowel sounds + Extremities: no edema, no cyanosis    The results of significant diagnostics from this hospitalization (including imaging, microbiology, ancillary and laboratory) are listed below for reference.     Microbiology: Recent Results (from the past 240 hour(s))  Culture, blood (Routine x 2)     Status: None (Preliminary result)   Collection Time: 05/16/18 10:57 AM  Result Value Ref Range Status   Specimen Description   Final    BLOOD RIGHT ANTECUBITAL Performed at [facility], [place] [address], [place], [place] [address]    Special Requests   Final    BOTTLES DRAWN AEROBIC AND ANAEROBIC Blood Culture adequate volume Performed at [place] [address], [place], [place] [address]     Culture   Final    NO GROWTH 4 DAYS Performed at [facility], [place] [address] [place], [place] [address]    Report Status PENDING  Incomplete  Culture, blood (Routine x 2)     Status: None (Preliminary result)   Collection Time: 05/16/18 10:57 AM  Result Value Ref Range Status   Specimen Description   Final    BLOOD LEFT ANTECUBITAL Performed at [facility], [place] [address] [place], [place] [address]    Special Requests   Final    BOTTLES DRAWN AEROBIC AND ANAEROBIC Blood Culture adequate volume Performed at [place] [address] [place], [place] [address]    Culture   Final  NO GROWTH 4 DAYS Performed at [facility], [place] [address] [place], [place] [address]    Report Status PENDING  Incomplete  Respiratory Panel by PCR     Status: Abnormal   Collection Time: 05/16/18 11:08 AM  Result Value Ref Range Status   Adenovirus NOT DETECTED NOT DETECTED Final   Coronavirus 229E NOT DETECTED NOT DETECTED Final    Comment: (NOTE) The Coronavirus on the Respiratory Panel, DOES NOT test for the novel  Coronavirus (2019 nCoV)    Coronavirus HKU1 NOT DETECTED NOT DETECTED Final   Coronavirus NL63 DETECTED (A) NOT DETECTED Final   Coronavirus OC43 NOT DETECTED NOT DETECTED Final   Metapneumovirus NOT DETECTED NOT DETECTED Final   Rhinovirus / Enterovirus NOT DETECTED NOT DETECTED Final   Influenza A NOT DETECTED NOT DETECTED Final   Influenza B NOT DETECTED NOT DETECTED Final   Parainfluenza Virus 1 NOT DETECTED NOT DETECTED Final   Parainfluenza Virus 2 NOT DETECTED NOT DETECTED Final   Parainfluenza Virus 3 NOT DETECTED NOT DETECTED Final   Parainfluenza Virus 4 NOT DETECTED NOT DETECTED Final   Respiratory Syncytial Virus NOT DETECTED NOT DETECTED Final   Bordetella pertussis NOT DETECTED NOT DETECTED Final   Chlamydophila pneumoniae NOT DETECTED NOT DETECTED Final   Mycoplasma pneumoniae NOT DETECTED NOT DETECTED Final    Comment:  Performed at [facility], [place] [address], [place], [place] [address]  Culture, sputum-assessment     Status: None   Collection Time: 05/16/18  1:07 PM  Result Value Ref Range Status   Specimen Description Expect. Sput  Final   Special Requests NONE  Final   Sputum evaluation   Final    THIS SPECIMEN IS ACCEPTABLE FOR SPUTUM CULTURE Performed at [facility], [place] [address] [place], [place] [address]    Report Status 05/21/2018 FINAL  Final  Culture, respiratory     Status: None (Preliminary result)   Collection Time: 05/16/18  1:07 PM  Result Value Ref Range Status   Specimen Description   Final    SPUTUM Performed at [facility], [place] [address], [place], [place] [address]    Special Requests   Final    NONE Reflexed from [id number] Performed at [facility], [place] [address], [place], [place] [address]    Gram Stain PENDING  Incomplete   Culture PENDING  Incomplete   Report Status PENDING  Incomplete  Culture, blood (routine x 2) Call MD if unable to obtain prior to antibiotics being given     Status: None (Preliminary result)   Collection Time: 05/16/18  3:19 PM  Result Value Ref Range Status   Specimen Description   Final    BLOOD RIGHT ARM Performed at [facility], [place] [address] [place], [place] [address]    Special Requests   Final    BOTTLES DRAWN AEROBIC AND ANAEROBIC Blood Culture adequate volume Performed at [place] [address] [place], [place] [address]    Culture   Final    NO GROWTH 4 DAYS Performed at [facility], [place] [address], [place],  [address]    Report Status PENDING  Incomplete  Culture, blood (routine x 2) Call MD if unable to obtain prior to antibiotics being given     Status: None (Preliminary result)   Collection Time: 05/16/18  3:31 PM  Result Value Ref Range Status   Specimen Description   Final  BLOOD Performed at [facility], [place] [address], [place], [place] [address]    Special Requests   Final    NONE Performed at [facility], [place] [address] [place], [place] [address]    Culture   Final    NO GROWTH 4 DAYS Performed at [facility], [place] [address], [place], [place] [address]    Report Status PENDING  Incomplete     Labs: BNP (last 3 results) No results for input(s): BNP in the last 8760 hours. Basic Metabolic Panel: Recent Labs  Lab 05/16/18 1049 05/16/18 1531 05/17/18 0441 05/18/18 0514 05/19/18 0335  NA 134*  --  136  --   --   K 4.2  --  3.6  --   --   CL 102  --  112*  --   --   CO2 24  --  18*  --   --   GLUCOSE 274*  --  158*  --   --   BUN 30*  --  22  --   --   CREATININE 1.15 1.09 0.94 0.97 0.99  CALCIUM 8.2*  --  6.9*  --   --    Liver Function Tests: Recent Labs  Lab 05/16/18 1049  AST 14*  ALT 13  ALKPHOS 83  BILITOT 1.0  PROT 5.5*  ALBUMIN 2.6*   No results for input(s): LIPASE, AMYLASE in the last 168 hours. No results for input(s): AMMONIA in the last 168 hours. CBC: Recent Labs  Lab 05/16/18 1049 05/17/18 0441 05/19/18 1352  WBC 0.3* 0.3* 7.2  NEUTROABS TOO FEW TO COUNT, SMEAR AVAILABLE FOR REVIEW  --   --   HGB 10.1* 7.4* 9.5*  HCT 32.6* 24.7* 29.6*  MCV 102.5* 105.1* 99.7  PLT 52* 44* 77*   Cardiac Enzymes: No results for input(s): CKTOTAL, CKMB, CKMBINDEX, TROPONINI in the last 168 hours. BNP: Invalid input(s): POCBNP CBG: No results for input(s): GLUCAP in the last 168 hours. D-Dimer No results for input(s): DDIMER in the last 72 hours. Hgb A1c No results for input(s): HGBA1C in the last 72 hours. Lipid Profile No results for input(s): CHOL, HDL, LDLCALC, TRIG, CHOLHDL, LDLDIRECT in the last 72 hours. Thyroid function studies No results for input(s): TSH, T4TOTAL, T3FREE, THYROIDAB in the last 72 hours.  Invalid input(s): FREET3 Anemia work up No results for input(s): VITAMINB12, FOLATE, FERRITIN, TIBC,  IRON, RETICCTPCT in the last 72 hours. Urinalysis    Component Value Date/Time   COLORURINE YELLOW 05/16/2018 1049   APPEARANCEUR CLEAR 05/16/2018 1049   LABSPEC 1.014 05/16/2018 1049   PHURINE 5.0 05/16/2018 1049   GLUCOSEU >=500 (A) 05/16/2018 1049   HGBUR NEGATIVE 05/16/2018 1049   BILIRUBINUR NEGATIVE 05/16/2018 1049   KETONESUR NEGATIVE 05/16/2018 1049   PROTEINUR 30 (A) 05/16/2018 1049   UROBILINOGEN 0.2 07/16/2010 1429   NITRITE NEGATIVE 05/16/2018 1049   LEUKOCYTESUR NEGATIVE 05/16/2018 1049   Sepsis Labs Invalid input(s): PROCALCITONIN,  WBC,  LACTICIDVEN Microbiology Recent Results (from the past 240 hour(s))  Culture, blood (Routine x 2)     Status: None (Preliminary result)   Collection Time: 05/16/18 10:57 AM  Result Value Ref Range Status   Specimen Description   Final    BLOOD RIGHT ANTECUBITAL Performed at [facility], [place] [address] [place], [place] [address]    Special Requests   Final    BOTTLES DRAWN AEROBIC AND ANAEROBIC Blood Culture adequate volume Performed at [facility]  [place] [address] [place], [place] [address]    Culture   Final    NO GROWTH 4 DAYS Performed at [facility], [place] [address], [place], [place] [address]    Report Status PENDING  Incomplete  Culture, blood (Routine x 2)     Status: None (Preliminary result)   Collection Time: 05/16/18 10:57 AM  Result Value Ref Range Status   Specimen Description   Final    BLOOD LEFT ANTECUBITAL Performed at [facility], [place] [address], [place], [place] [address]    Special Requests   Final    BOTTLES DRAWN AEROBIC AND ANAEROBIC Blood Culture adequate volume Performed at [place] [address], [place], [place] [address]    Culture   Final    NO GROWTH 4 DAYS Performed at [facility], [place] [address], [place], [place] [address]    Report Status PENDING  Incomplete  Respiratory Panel by PCR     Status: Abnormal    Collection Time: 05/16/18 11:08 AM  Result Value Ref Range Status   Adenovirus NOT DETECTED NOT DETECTED Final   Coronavirus 229E NOT DETECTED NOT DETECTED Final    Comment: (NOTE) The Coronavirus on the Respiratory Panel, DOES NOT test for the novel  Coronavirus (2019 nCoV)    Coronavirus HKU1 NOT DETECTED NOT DETECTED Final   Coronavirus NL63 DETECTED (A) NOT DETECTED Final   Coronavirus OC43 NOT DETECTED NOT DETECTED Final   Metapneumovirus NOT DETECTED NOT DETECTED Final   Rhinovirus / Enterovirus NOT DETECTED NOT DETECTED Final   Influenza A NOT DETECTED NOT DETECTED Final   Influenza B NOT DETECTED NOT DETECTED Final   Parainfluenza Virus 1 NOT DETECTED NOT DETECTED Final   Parainfluenza Virus 2 NOT DETECTED NOT DETECTED Final   Parainfluenza Virus 3 NOT DETECTED NOT DETECTED Final   Parainfluenza Virus 4 NOT DETECTED NOT DETECTED Final   Respiratory Syncytial Virus NOT DETECTED NOT DETECTED Final   Bordetella pertussis NOT DETECTED NOT DETECTED Final   Chlamydophila pneumoniae NOT DETECTED NOT DETECTED Final   Mycoplasma pneumoniae NOT DETECTED NOT DETECTED Final    Comment: Performed at [facility], [place] [address], [place], [place] [address]  Culture, sputum-assessment     Status: None   Collection Time: 05/16/18  1:07 PM  Result Value Ref Range Status   Specimen Description Expect. Sput  Final   Special Requests NONE  Final   Sputum evaluation   Final    THIS SPECIMEN IS ACCEPTABLE FOR SPUTUM CULTURE Performed at [facility], [place] [address] [place], [place] [address]    Report Status 05/21/2018 FINAL  Final  Culture, respiratory     Status: None (Preliminary result)   Collection Time: 05/16/18  1:07 PM  Result Value Ref Range Status   Specimen Description   Final    SPUTUM Performed at [facility], [place] [address] [place], [place] [address]    Special Requests   Final    NONE Reflexed from [id number] Performed at [facility], [place] [address] [place],  [address]    Gram Stain PENDING  Incomplete   Culture PENDING  Incomplete   Report Status PENDING  Incomplete  Culture, blood (routine x 2) Call MD if unable to obtain prior to antibiotics being given     Status: None (Preliminary result)   Collection Time: 05/16/18  3:19 PM  Result Value Ref  Range Status   Specimen Description   Final    BLOOD RIGHT ARM Performed at [place] [address], [place], [place] [address]    Special Requests   Final    BOTTLES DRAWN AEROBIC AND ANAEROBIC Blood Culture adequate volume Performed at [place] [address], [place], [place] [address]    Culture   Final    NO GROWTH 4 DAYS Performed at [facility], [place] [address] [place], [place] [address]    Report Status PENDING  Incomplete  Culture, blood (routine x 2) Call MD if unable to obtain prior to antibiotics being given     Status: None (Preliminary result)   Collection Time: 05/16/18  3:31 PM  Result Value Ref Range Status   Specimen Description   Final    BLOOD Performed at [facility], [place] [address], [place], [place] [address]    Special Requests   Final    NONE Performed at [facility], [place] [address] [place], [place] [address]    Culture   Final    NO GROWTH 4 DAYS Performed at [facility], [place] [address] [place],  [address]    Report Status PENDING  Incomplete    Please note: You were cared for by a hospitalist during your hospital stay. Once you are discharged, your primary care physician will handle any further medical issues. Please note that NO REFILLS for any discharge medications will be authorized once you are discharged, as it is imperative that you return to your primary care physician (or establish a relationship with a primary care physician if you do not have one) for your post hospital discharge needs so that they can reassess  your need for medications and monitor your lab values.    Time coordinating discharge: 40 minutes  SIGNED:   Shelly Coss, MD  [facility] 05/21/2018, 3:19 PM Pager [phone]  If 7PM-7AM, please contact night-coverage [email or web address] Password [email or web address]

## 2018-05-21 NOTE — TOC Transition Note (Addendum)
Transition of Care [facility]) - CM/SW Discharge Note   Patient Details  Name: Martin [name] MRN: [id number] Date of Birth: 28-Apr-1930  [facility] CM/SW Contact:  Lia Hopping, LCSW Phone Number: 05/21/2018, 4:38 PM   Clinical Narrative:    Nurse call report to: [phone] [facility] arranged for transport. Room 101B (Patient does not have a roommate)   Final next level of care: Skilled Nursing Facility Barriers to Discharge: No Barriers Identified   Patient Goals and CMS Choice Patient states their goals for this hospitalization and ongoing recovery are:: see if I can rehab (post acute goal) CMS Medicare.gov Compare Post Acute Care list provided to:: Patient(pt requests [facility] specifically)    Discharge Placement   Existing PASRR number confirmed : 05/20/18          Patient chooses bed at: [facility] Patient to be transferred to facility by: [place] Name of family member notified: Granddaughter Pam Patient and family notified of of transfer: 05/21/18  Discharge Plan and Services   Post Acute Care Choice: [place]                    Social Determinants of Health (SDOH) Interventions     Readmission Risk Interventions Readmission Risk Prevention Plan 05/20/2018  Transportation Screening Complete  PCP or Specialist Appt within 3-5 Days (No Data)  [place] or [place] (No Data)  Social Work Consult for [place] Planning/Counseling [place] Screening Complete  Medication Review Press photographer) Complete  Some recent data might be hidden

## 2018-05-23 LAB — CULTURE, RESPIRATORY W GRAM STAIN: Culture: NORMAL

## 2018-05-24 DIAGNOSIS — J129 Viral pneumonia, unspecified: Secondary | ICD-10-CM | POA: Diagnosis not present

## 2018-05-24 DIAGNOSIS — C3490 Malignant neoplasm of unspecified part of unspecified bronchus or lung: Secondary | ICD-10-CM | POA: Diagnosis not present

## 2018-05-24 DIAGNOSIS — R05 Cough: Secondary | ICD-10-CM | POA: Diagnosis not present

## 2018-05-24 DIAGNOSIS — R5381 Other malaise: Secondary | ICD-10-CM | POA: Diagnosis not present

## 2018-05-25 DIAGNOSIS — C3490 Malignant neoplasm of unspecified part of unspecified bronchus or lung: Secondary | ICD-10-CM | POA: Diagnosis not present

## 2018-05-25 DIAGNOSIS — I480 Paroxysmal atrial fibrillation: Secondary | ICD-10-CM | POA: Diagnosis not present

## 2018-05-25 DIAGNOSIS — I10 Essential (primary) hypertension: Secondary | ICD-10-CM | POA: Diagnosis not present

## 2018-05-25 DIAGNOSIS — E1122 Type 2 diabetes mellitus with diabetic chronic kidney disease: Secondary | ICD-10-CM | POA: Diagnosis not present

## 2018-05-26 ENCOUNTER — Inpatient Hospital Stay: Payer: Medicare Other

## 2018-05-26 ENCOUNTER — Telehealth: Payer: Self-pay | Admitting: Medical Oncology

## 2018-05-26 NOTE — Telephone Encounter (Signed)
Spoke to [place] on [place] about next appt. Pt is ambulatory and independent. I gave him dates for next appt.

## 2018-05-26 NOTE — Telephone Encounter (Signed)
Will see him next week

## 2018-05-27 ENCOUNTER — Encounter: Payer: Self-pay | Admitting: Pharmacist

## 2018-05-27 ENCOUNTER — Telehealth: Payer: Self-pay | Admitting: *Deleted

## 2018-05-27 ENCOUNTER — Other Ambulatory Visit: Payer: Self-pay | Admitting: *Deleted

## 2018-05-27 ENCOUNTER — Other Ambulatory Visit: Payer: Medicare Other

## 2018-05-27 ENCOUNTER — Ambulatory Visit: Payer: Medicare Other | Admitting: Internal Medicine

## 2018-05-27 ENCOUNTER — Ambulatory Visit: Payer: Medicare Other

## 2018-05-27 NOTE — Telephone Encounter (Signed)
Cancelled appt in Epic and [facility] care notified . Confirmed apt for next week.

## 2018-05-27 NOTE — Telephone Encounter (Addendum)
"  Martin [name]'s POA Richard Dorann Ou [phone]) calling in reference to today's lab appointment.  Lab was preparation for chemotherapy.  He will no longer receive chemotherapy.  Does he need to come today for labs"  Okay to leave message with return call."

## 2018-05-27 NOTE — Telephone Encounter (Signed)
Notified Martin [name] does not need to come in for appointment today.  Will have lab and F/U Monday.

## 2018-05-27 NOTE — Patient Outreach (Signed)
[facility] [facility]) Care Management  05/27/2018  Martin [name] [birth year] [id number]   Noted readmission to [facility].  Contact with [facility], Herma Ard, RN.  Patient will be returning to ALF if he is able, if not he will remain LTC at facility.   Will sign off, but can be re-consulted if any [facility] community care management needs arise.  Royetta Crochet. Laymond Purser, MSN, RN, [employer] , [place] [phone]) Business Cell  [phone]) Toll Free Office

## 2018-05-31 ENCOUNTER — Inpatient Hospital Stay: Payer: Medicare Other

## 2018-05-31 ENCOUNTER — Inpatient Hospital Stay: Payer: Medicare Other | Admitting: Internal Medicine

## 2018-05-31 ENCOUNTER — Telehealth: Payer: Self-pay | Admitting: Medical Oncology

## 2018-05-31 ENCOUNTER — Telehealth: Payer: Self-pay | Admitting: Internal Medicine

## 2018-05-31 NOTE — Telephone Encounter (Signed)
Called Tammy Donaavan at [facility]

## 2018-05-31 NOTE — Telephone Encounter (Signed)
Spoke to staff at [facility] and delayed appt by 1 week. Schedule message sent. Call Fredrich Romans to r/s

## 2018-06-01 ENCOUNTER — Ambulatory Visit: Payer: Medicare Other

## 2018-06-01 ENCOUNTER — Ambulatory Visit: Payer: Medicare Other | Admitting: Physician Assistant

## 2018-06-01 ENCOUNTER — Other Ambulatory Visit: Payer: Medicare Other

## 2018-06-02 ENCOUNTER — Other Ambulatory Visit: Payer: Medicare Other

## 2018-06-02 ENCOUNTER — Inpatient Hospital Stay: Payer: Medicare Other

## 2018-06-03 ENCOUNTER — Ambulatory Visit: Payer: Medicare Other

## 2018-06-04 ENCOUNTER — Telehealth: Payer: Self-pay | Admitting: *Deleted

## 2018-06-04 NOTE — Telephone Encounter (Signed)
TCT patient regarding his appts for Monday, 06/07/18. He states he is  In Rehab right now after recent hospitalization.  He states he will no longer get chemotherapy d/t poor tolerance-he has hospitalized x 2 since starting treatments.   He stated that if Dr. Julien Nordmann could just do radiation treatments, he would be ok with that. Pt does not plan to come in on 06/07/18.  He would be agreeable to come in to see Dr. Julien Nordmann when he is feeling stronger.

## 2018-06-07 ENCOUNTER — Ambulatory Visit: Payer: Medicare Other

## 2018-06-07 ENCOUNTER — Other Ambulatory Visit: Payer: Medicare Other

## 2018-06-07 ENCOUNTER — Ambulatory Visit: Payer: Medicare Other | Admitting: Internal Medicine

## 2018-06-09 ENCOUNTER — Inpatient Hospital Stay: Payer: No Typology Code available for payment source

## 2018-06-09 DIAGNOSIS — N183 Chronic kidney disease, stage 3 (moderate): Secondary | ICD-10-CM | POA: Diagnosis not present

## 2018-06-09 DIAGNOSIS — I739 Peripheral vascular disease, unspecified: Secondary | ICD-10-CM | POA: Diagnosis not present

## 2018-06-09 DIAGNOSIS — J449 Chronic obstructive pulmonary disease, unspecified: Secondary | ICD-10-CM | POA: Diagnosis not present

## 2018-06-09 DIAGNOSIS — M6281 Muscle weakness (generalized): Secondary | ICD-10-CM | POA: Diagnosis not present

## 2018-06-09 DIAGNOSIS — C7951 Secondary malignant neoplasm of bone: Secondary | ICD-10-CM | POA: Diagnosis not present

## 2018-06-09 DIAGNOSIS — C3490 Malignant neoplasm of unspecified part of unspecified bronchus or lung: Secondary | ICD-10-CM | POA: Diagnosis not present

## 2018-06-11 ENCOUNTER — Telehealth: Payer: Self-pay | Admitting: Medical Oncology

## 2018-06-11 NOTE — Telephone Encounter (Addendum)
LVM to return call to change visit on wed to Monday , a virtual visit.Marland Kitchen

## 2018-06-11 NOTE — Telephone Encounter (Signed)
Per grandson, Martin [name], Pt has moved back to his house by himself and people lined up to check on him. Belva Crome , checking on him daily.

## 2018-06-11 NOTE — Telephone Encounter (Addendum)
I contacted pt -he reports he is "hawking up red " sputum "less than a teaspoon". He denies pain or change in his breathing. He is speaking without hesitation. I instructed him to go to ED if symptoms worsen or has increased uncontrolled bleeding.

## 2018-06-12 DIAGNOSIS — D61818 Other pancytopenia: Secondary | ICD-10-CM | POA: Diagnosis not present

## 2018-06-12 DIAGNOSIS — I252 Old myocardial infarction: Secondary | ICD-10-CM | POA: Diagnosis not present

## 2018-06-12 DIAGNOSIS — F411 Generalized anxiety disorder: Secondary | ICD-10-CM | POA: Diagnosis not present

## 2018-06-12 DIAGNOSIS — C7951 Secondary malignant neoplasm of bone: Secondary | ICD-10-CM | POA: Diagnosis not present

## 2018-06-12 DIAGNOSIS — Z8673 Personal history of transient ischemic attack (TIA), and cerebral infarction without residual deficits: Secondary | ICD-10-CM | POA: Diagnosis not present

## 2018-06-12 DIAGNOSIS — Z87891 Personal history of nicotine dependence: Secondary | ICD-10-CM | POA: Diagnosis not present

## 2018-06-12 DIAGNOSIS — C3490 Malignant neoplasm of unspecified part of unspecified bronchus or lung: Secondary | ICD-10-CM | POA: Diagnosis not present

## 2018-06-12 DIAGNOSIS — K219 Gastro-esophageal reflux disease without esophagitis: Secondary | ICD-10-CM | POA: Diagnosis not present

## 2018-06-12 DIAGNOSIS — N183 Chronic kidney disease, stage 3 (moderate): Secondary | ICD-10-CM | POA: Diagnosis not present

## 2018-06-12 DIAGNOSIS — I2510 Atherosclerotic heart disease of native coronary artery without angina pectoris: Secondary | ICD-10-CM | POA: Diagnosis not present

## 2018-06-12 DIAGNOSIS — E1151 Type 2 diabetes mellitus with diabetic peripheral angiopathy without gangrene: Secondary | ICD-10-CM | POA: Diagnosis not present

## 2018-06-12 DIAGNOSIS — I4891 Unspecified atrial fibrillation: Secondary | ICD-10-CM | POA: Diagnosis not present

## 2018-06-12 DIAGNOSIS — E1122 Type 2 diabetes mellitus with diabetic chronic kidney disease: Secondary | ICD-10-CM | POA: Diagnosis not present

## 2018-06-12 DIAGNOSIS — J440 Chronic obstructive pulmonary disease with acute lower respiratory infection: Secondary | ICD-10-CM | POA: Diagnosis not present

## 2018-06-12 DIAGNOSIS — I129 Hypertensive chronic kidney disease with stage 1 through stage 4 chronic kidney disease, or unspecified chronic kidney disease: Secondary | ICD-10-CM | POA: Diagnosis not present

## 2018-06-12 DIAGNOSIS — J188 Other pneumonia, unspecified organism: Secondary | ICD-10-CM | POA: Diagnosis not present

## 2018-06-12 DIAGNOSIS — Z9981 Dependence on supplemental oxygen: Secondary | ICD-10-CM | POA: Diagnosis not present

## 2018-06-12 DIAGNOSIS — E785 Hyperlipidemia, unspecified: Secondary | ICD-10-CM | POA: Diagnosis not present

## 2018-06-14 ENCOUNTER — Ambulatory Visit: Payer: Medicare Other | Admitting: Internal Medicine

## 2018-06-14 ENCOUNTER — Other Ambulatory Visit: Payer: Self-pay | Admitting: *Deleted

## 2018-06-14 ENCOUNTER — Encounter: Payer: Self-pay | Admitting: Internal Medicine

## 2018-06-14 ENCOUNTER — Inpatient Hospital Stay: Payer: No Typology Code available for payment source | Attending: Internal Medicine | Admitting: Internal Medicine

## 2018-06-14 DIAGNOSIS — C3490 Malignant neoplasm of unspecified part of unspecified bronchus or lung: Secondary | ICD-10-CM | POA: Diagnosis not present

## 2018-06-14 DIAGNOSIS — J188 Other pneumonia, unspecified organism: Secondary | ICD-10-CM | POA: Diagnosis not present

## 2018-06-14 DIAGNOSIS — I129 Hypertensive chronic kidney disease with stage 1 through stage 4 chronic kidney disease, or unspecified chronic kidney disease: Secondary | ICD-10-CM | POA: Diagnosis not present

## 2018-06-14 DIAGNOSIS — J440 Chronic obstructive pulmonary disease with acute lower respiratory infection: Secondary | ICD-10-CM | POA: Diagnosis not present

## 2018-06-14 DIAGNOSIS — C7951 Secondary malignant neoplasm of bone: Secondary | ICD-10-CM | POA: Diagnosis not present

## 2018-06-14 DIAGNOSIS — D61818 Other pancytopenia: Secondary | ICD-10-CM | POA: Diagnosis not present

## 2018-06-14 NOTE — Progress Notes (Signed)
Virtual Visit via Telephone Note  I connected with Martin [name] on 06/14/18 at  2:00 PM EDT by telephone and verified that I am speaking with the correct person using two identifiers.   I discussed the limitations, risks, security and privacy concerns of performing an evaluation and management service by telephone and the availability of in person appointments. I also discussed with the patient that there may be a patient responsible charge related to this service. The patient expressed understanding and agreed to proceed.       [place] [phone]   [phone]  OFFICE PROGRESS NOTE  Susy Frizzle, MD [address] [place] [address]  DIAGNOSIS: Stage IV (T2b, N0, M1c) non-small cell carcinoma presented with right upper lobe lung mass in addition to metastatic disease to the left fourth rib as well as right sacral area and pleural-based nodularity.  This was diagnosed in January 2020  PRIOR THERAPY: Palliative radiotherapy to the right sacral metastatic bone lesion under the care of Dr. Tammi Klippel  CURRENT THERAPY: Systemic chemotherapy with carboplatin for AUC of 5, paclitaxel 175 mg/M2 and Keytruda 200 mg IV every 3 weeks with Neulasta support  History of Present Illness: Martin [name] 85 y.o. male has a virtual telephone visit today.  The patient is feeling fine today with no concerning complaints except for generalized weakness more in the lower extremities as well as mild low back pain.  He denied having any current fever or chills.  He has no nausea, vomiting, diarrhea or constipation.  He denied having any headache or visual changes.  He has not time tolerating his systemic chemotherapy and he was admitted to the hospital twice after every cycle of the chemotherapy.  He is not interested in proceeding with any further chemotherapy at this point.  We have the visit for evaluation and discussion of his treatment options.   MEDICAL  HISTORY: Past Medical History:  Diagnosis Date   Arthritis    Cancer ([place])    CKD (chronic kidney disease) stage 3, GFR 30-59 ml/min (HCC)    COPD (chronic obstructive pulmonary disease) ([place])    Diabetes mellitus    denies, reports resolved with diet   GERD (gastroesophageal reflux disease)    H/O: GI bleed    Hiatal hernia    Hyperlipidemia    Hypertension    Myocardial infarction [facility])    1978 & 2011   Peripheral vascular disease, unspecified ([place])    Renal artery stenosis ([place])    Stroke ([place])     ALLERGIES:  is allergic to penicillins.  MEDICATIONS:  Current Outpatient Medications  Medication Sig Dispense Refill   ALPRAZolam (XANAX) 0.5 MG tablet Take 1 tablet (0.5 mg total) by mouth at bedtime. 15 tablet 0   atorvastatin (LIPITOR) 20 MG tablet TAKE 1 TABLET BY MOUTH ONCE DAILY AT  6PM. (Patient taking differently: Take 20 mg by mouth daily. ) 90 tablet 0   budesonide-formoterol (SYMBICORT) 160-4.5 MCG/ACT inhaler Inhale 2 puffs into the lungs 2 (two) times daily as needed (for COPD flares).     famotidine (PEPCID) 20 MG tablet TAKE 1 TABLET BY MOUTH TWICE DAILY (Patient not taking: No sig reported) 60 tablet 5   HYDROcodone-acetaminophen (NORCO/VICODIN) 5-325 MG tablet Take 1 tablet by mouth every 6 (six) hours as needed for moderate pain. 10 tablet 0   loperamide (IMODIUM) 2 MG capsule Take 1 capsule (2 mg total) by mouth 3 (three) times daily as  needed for diarrhea or loose stools. 30 capsule 0   metoprolol succinate (TOPROL-XL) 25 MG 24 hr tablet TAKE 1 TABLET BY MOUTH ONCE DAILY (Patient taking differently: Take 25 mg by mouth daily. ) 90 tablet 2   NITROSTAT 0.4 MG SL tablet DISSOLVE ONE TABLET UNDER THE TONGUE AS DIRECTED (Patient taking differently: Place 0.4 mg under the tongue every 5 (five) minutes as needed for chest pain. Max 3 doses) 25 tablet 0   ondansetron (ZOFRAN ODT) 4 MG disintegrating tablet Take 1 tablet (4 mg total) by mouth every 8  (eight) hours as needed for nausea or vomiting. 20 tablet 0   polyethylene glycol (MIRALAX / GLYCOLAX) packet Take 17 g by mouth daily as needed for moderate constipation or severe constipation. 14 each 0   predniSONE (DELTASONE) 10 MG tablet Take 10 mg by mouth daily with breakfast.     traZODone (DESYREL) 25 mg TABS tablet Take 25 mg by mouth at bedtime.     No current facility-administered medications for this visit.     SURGICAL HISTORY:  Past Surgical History:  Procedure Laterality Date   Aortobifemoral BPG  07/22/10   and Right femoral-popliteal BPG    REVIEW OF SYSTEMS:  A comprehensive review of systems was negative except for: Constitutional: positive for fatigue Musculoskeletal: positive for muscle weakness    LABORATORY DATA: Lab Results  Component Value Date   WBC 7.2 05/19/2018   HGB 9.5 (L) 05/19/2018   HCT 29.6 (L) 05/19/2018   MCV 99.7 05/19/2018   PLT 77 (L) 05/19/2018      Chemistry      Component Value Date/Time   NA 136 05/17/2018 0441   K 3.6 05/17/2018 0441   CL 112 (H) 05/17/2018 0441   CO2 18 (L) 05/17/2018 0441   BUN 22 05/17/2018 0441   CREATININE 0.99 05/19/2018 0335   CREATININE 1.12 05/10/2018 0725   CREATININE 1.15 (H) 03/30/2018 1239      Component Value Date/Time   CALCIUM 6.9 (L) 05/17/2018 0441   ALKPHOS 83 05/16/2018 1049   AST 14 (L) 05/16/2018 1049   AST 11 (L) 05/10/2018 0725   ALT 13 05/16/2018 1049   ALT 12 05/10/2018 0725   BILITOT 1.0 05/16/2018 1049   BILITOT 0.5 05/10/2018 0725       RADIOGRAPHIC STUDIES: Dg Chest 2 View  Result Date: 05/16/2018 CLINICAL DATA:  Metastatic lung cancer, shortness of breath EXAM: CHEST - 2 VIEW COMPARISON:  03/18/2018, 04/01/2018 FINDINGS: Patchy right lower lung airspace process concerning for right basilar pneumonia or aspiration, new since 03/18/2018. Left lung remains clear. Ill-defined known right apical mass is less apparent on the prior study. Enlargement of the left fourth  rib metastasis noted. Normal heart size and vascularity. Trachea midline. Aorta atherosclerotic. IMPRESSION: New right basilar patchy airspace process compatible with pneumonia or aspiration Known right apical mass is better demonstrated by PET-CT Enlargement of the left fourth rib destructive metastasis Electronically Signed   By: Jerilynn Mages.  Shick M.D.   On: 05/16/2018 12:19   Dg Chest Port 1 View  Result Date: 05/19/2018 CLINICAL DATA:  Fever and cough, history of non-small cell lung carcinoma EXAM: PORTABLE CHEST 1 VIEW COMPARISON:  05/18/2018 FINDINGS: Cardiac shadow is within normal limits. Left lung is clear. Right lung demonstrates patchy infiltrates similar to that seen on the prior exam consistent with acute infiltrate. Persistent destructive changes of the left third rib posterolaterally are noted. IMPRESSION: Stable right basilar infiltrate. Electronically Signed   By: Elta Guadeloupe  Lukens M.D.   On: 05/19/2018 10:47   Dg Chest Port 1 View  Result Date: 05/18/2018 CLINICAL DATA:  Shortness of breath. EXAM: PORTABLE CHEST 1 VIEW COMPARISON:  Radiographs of May 16, 2018. CT scan of March 16, 2018. FINDINGS: Stable cardiomediastinal silhouette. No pneumothorax is noted. Increased right basilar opacity is noted concerning for pneumonia or atelectasis with associated pleural effusion. Stable lytic expansile metastatic lesion seen in left third rib. IMPRESSION: Increased right basilar opacity is noted concerning for worsening pneumonia or atelectasis with associated pleural effusion. Stable lytic expansile lesion in left third rib. Electronically Signed   By: Marijo Conception, M.D.   On: 05/18/2018 14:41    ASSESSMENT AND PLAN: This is a very pleasant 85 years old white male with metastatic non-small cell lung cancer of undifferentiated histology presented with right upper lobe lung mass in addition to left chest wall mass lesion as well as metastatic sacral lytic lesion diagnosed in January 2020. The patient was  started on reduced dose systemic chemotherapy with carboplatin, paclitaxel and Keytruda status post 2 cycles.  He has rough time tolerating this treatment and was admitted to the hospital twice with pancytopenia as well as questionable pneumonia. I had a lengthy discussion with the patient about his condition. I recommended for the patient to have repeat CT scan of the chest, abdomen and pelvis in 2 months for reevaluation of his disease. I will discuss with the patient has a treatment options at that time including therapy with immunotherapy needed versus palliative care. The patient agreed to the current plan. He was advised to call if he has any concerning symptoms in the interval. The patient voices understanding of current disease status and treatment options and is in agreement with the current care plan. All questions were answered. The patient knows to call the clinic with any problems, questions or concerns. We can certainly see the patient much sooner if necessary.  Disclaimer: This note was dictated with voice recognition software. Similar sounding words can inadvertently be transcribed and may not be corrected upon review.  Follow Up Instructions: Lab, schedule follow-up visit in 3 months.   I discussed the assessment and treatment plan with the patient. The patient was provided an opportunity to ask questions and all were answered. The patient agreed with the plan and demonstrated an understanding of the instructions.   The patient was advised to call back or seek an in-person evaluation if the symptoms worsen or if the condition fails to improve as anticipated.  I provided 12 minutes of non-face-to-face time during this encounter.   Eilleen Kempf, MD

## 2018-06-14 NOTE — Patient Outreach (Signed)
[place] [facility]) Care Management  06/14/2018  Martin [name] 1930-06-02 [id number]   EMMI-general discharge- [facility] then to [place] care, now d/c home  RED ON [place]- patient Day # 1 Date: Saturday 06/12/18 [place] Reason: Read discharge papers? No Know who to call about changes in condition? No Taking meds? I Don't Know    Insurance: medicare, mutual of [place] [facility] x  3 ED visits x 3 in the last 6 months    Outreach attempt # 1 A to his home number, no answer after various rings Outreach attempt #1 B to mobile number He answered Patient is able to verify HIPAA [place] Management RN reviewed and addressed red alert with patient He reports poor reception with his mobile number and request a call to his home number but there was difficulty in reaching him   He gave CM permission to also call to speak with his grandson Martin [name]    EMMI:  Mr Ciszewski reports the EMMI answers are incorrect He confirms he has his discharge instructions on his kitchen table where he is located at but has not had time to read through them well. Cm reviewed standard transition of care questions and general information on discharge instructions. He reports he has a telephone appointment with Dr Earlie Server today at 2 pm and he knows to call Dr Earlie Server. CM discussed also to call Dr Dennard Schaumann for non oncology concerns.  He reports he is taking his medications He reports he is having services through the DSS and [name] at home   Concern Oxygen "tank not working" CM called [employer] to speak with Martin [name] about his oxygen tank. CM and Martin [name] completed a conference call to Mr Dennard's mobile number. Martin [name] at home  PT was in the home She confirms Mr Loeb's oxygen was not working because he had it unplugged from the concentrator. She plugged it back, he is now getting oxygen and he is doing well   [facility] RN CM called to speak with Martin [name] to discuss the EMMI call and [facility]  screening. Discussed that Mr Deruiter would like the option of calling [facility] prn after [name] home services end to see if he needs [facility] services. CM discussed educating Mr Deere on his oxygen tank and the concern voiced that it had not been working.  Martin [name] confirms he checks on Mr Zinn in the am and then in the pm generally every day and provides transportation and food.  Martin [name] confirms Mr Galluzzo states he is agreeable to radiation by no chemo as he has been hospitalized after each chemo session. Mr Pfefferle know to speak with Dr Martin [name] abut this during the MD call today    Social: Mr Aschoff is widowed and now living alone at his home after d/c from [facility] facility with help from his grandson, Martin LovettAssociate Professor), [name] at home and some DSS staff.  He reports no issues with transportation to medical appointments but due to COVID 19 is preferring some telephone assessments. He is independent/assist with his care needs    Conditions: metastatic lung cancer to bones, hx of RLL PNA, MI, atrial fibrillation, PVD, stroke, CAD, HTN, COPD, slow transit constipation, DM type 2, CKD, HLD, hx of tobacco abuse, anxiety, insomnia, thrombocytopenia, leukocytosis,  DME: oxygen  Medications: denies concerns with taking medications as prescribed, affording medications, side effects of medications and questions about medications    Appointments: 06/14/18 telephone call from Dr Martin [name] to determine further  tx plan options  Advance Directives: Martin [name] grandson is HCPOA   Consent: [facility] RN CM reviewed [facility] services with patient. Patient gave verbal consent for services [facility] telephonic RN CM.   Advised patient that there will be further automated EMMI- post discharge calls to assess how the patient is doing following the recent hospitalization Advised the patient that another call may be received from a nurse if any of their responses were abnormal. Patient voiced understanding and was appreciative of f/u  call.   Plan: [facility] RN CM will close case at this time as patient has been assessed and no needs identified/needs resolved.   Pt encouraged to return a call to [place] CM prn  [facility] RN CM sent a successful outreach letter as discussed with [facility] brochure enclosed for review  Routed notes to MDs on care team  Vinicio Lynk L. Lavina Hamman, RN, BSN, [place] Coordinator Office number [phone] Mobile number [phone]  Main [facility] number [phone] Fax number [phone]

## 2018-06-15 ENCOUNTER — Telehealth: Payer: Self-pay | Admitting: Internal Medicine

## 2018-06-15 NOTE — Telephone Encounter (Signed)
Called regarding 6/5 and 6/9

## 2018-06-16 ENCOUNTER — Ambulatory Visit: Payer: Medicare Other

## 2018-06-16 ENCOUNTER — Ambulatory Visit: Payer: Medicare Other | Admitting: Internal Medicine

## 2018-06-16 ENCOUNTER — Telehealth: Payer: Self-pay | Admitting: Family Medicine

## 2018-06-16 ENCOUNTER — Other Ambulatory Visit: Payer: Medicare Other

## 2018-06-16 DIAGNOSIS — J188 Other pneumonia, unspecified organism: Secondary | ICD-10-CM | POA: Diagnosis not present

## 2018-06-16 DIAGNOSIS — I129 Hypertensive chronic kidney disease with stage 1 through stage 4 chronic kidney disease, or unspecified chronic kidney disease: Secondary | ICD-10-CM | POA: Diagnosis not present

## 2018-06-16 DIAGNOSIS — C7951 Secondary malignant neoplasm of bone: Secondary | ICD-10-CM | POA: Diagnosis not present

## 2018-06-16 DIAGNOSIS — D61818 Other pancytopenia: Secondary | ICD-10-CM | POA: Diagnosis not present

## 2018-06-16 DIAGNOSIS — C3490 Malignant neoplasm of unspecified part of unspecified bronchus or lung: Secondary | ICD-10-CM | POA: Diagnosis not present

## 2018-06-16 DIAGNOSIS — J440 Chronic obstructive pulmonary disease with acute lower respiratory infection: Secondary | ICD-10-CM | POA: Diagnosis not present

## 2018-06-16 NOTE — Telephone Encounter (Signed)
Awaiting nurse to call back so that we may give verbal orders.

## 2018-06-16 NOTE — Telephone Encounter (Signed)
Received a phone call from OT with [facility] asking for verbal orders for 1 week 1, 2 week 2, and 1 week 1. Verbal orders given. Orders will be faxed to be signed.

## 2018-06-16 NOTE — Telephone Encounter (Signed)
[facility] called wanting verbal PT order for 2x a week for 2wk and 1x a week for 3wks.   Nurse did not leave a cb number.

## 2018-06-18 ENCOUNTER — Ambulatory Visit: Payer: Medicare Other

## 2018-06-18 DIAGNOSIS — I129 Hypertensive chronic kidney disease with stage 1 through stage 4 chronic kidney disease, or unspecified chronic kidney disease: Secondary | ICD-10-CM | POA: Diagnosis not present

## 2018-06-18 DIAGNOSIS — J188 Other pneumonia, unspecified organism: Secondary | ICD-10-CM | POA: Diagnosis not present

## 2018-06-18 DIAGNOSIS — J440 Chronic obstructive pulmonary disease with acute lower respiratory infection: Secondary | ICD-10-CM | POA: Diagnosis not present

## 2018-06-18 DIAGNOSIS — D61818 Other pancytopenia: Secondary | ICD-10-CM | POA: Diagnosis not present

## 2018-06-18 DIAGNOSIS — C7951 Secondary malignant neoplasm of bone: Secondary | ICD-10-CM | POA: Diagnosis not present

## 2018-06-18 DIAGNOSIS — C3490 Malignant neoplasm of unspecified part of unspecified bronchus or lung: Secondary | ICD-10-CM | POA: Diagnosis not present

## 2018-06-21 DIAGNOSIS — J440 Chronic obstructive pulmonary disease with acute lower respiratory infection: Secondary | ICD-10-CM | POA: Diagnosis not present

## 2018-06-21 DIAGNOSIS — I129 Hypertensive chronic kidney disease with stage 1 through stage 4 chronic kidney disease, or unspecified chronic kidney disease: Secondary | ICD-10-CM | POA: Diagnosis not present

## 2018-06-21 DIAGNOSIS — D61818 Other pancytopenia: Secondary | ICD-10-CM | POA: Diagnosis not present

## 2018-06-21 DIAGNOSIS — C3490 Malignant neoplasm of unspecified part of unspecified bronchus or lung: Secondary | ICD-10-CM | POA: Diagnosis not present

## 2018-06-21 DIAGNOSIS — J188 Other pneumonia, unspecified organism: Secondary | ICD-10-CM | POA: Diagnosis not present

## 2018-06-21 DIAGNOSIS — C7951 Secondary malignant neoplasm of bone: Secondary | ICD-10-CM | POA: Diagnosis not present

## 2018-06-22 DIAGNOSIS — D61818 Other pancytopenia: Secondary | ICD-10-CM | POA: Diagnosis not present

## 2018-06-22 DIAGNOSIS — C7951 Secondary malignant neoplasm of bone: Secondary | ICD-10-CM | POA: Diagnosis not present

## 2018-06-22 DIAGNOSIS — C3490 Malignant neoplasm of unspecified part of unspecified bronchus or lung: Secondary | ICD-10-CM | POA: Diagnosis not present

## 2018-06-22 DIAGNOSIS — J440 Chronic obstructive pulmonary disease with acute lower respiratory infection: Secondary | ICD-10-CM | POA: Diagnosis not present

## 2018-06-22 DIAGNOSIS — I129 Hypertensive chronic kidney disease with stage 1 through stage 4 chronic kidney disease, or unspecified chronic kidney disease: Secondary | ICD-10-CM | POA: Diagnosis not present

## 2018-06-22 DIAGNOSIS — J188 Other pneumonia, unspecified organism: Secondary | ICD-10-CM | POA: Diagnosis not present

## 2018-06-23 DIAGNOSIS — D61818 Other pancytopenia: Secondary | ICD-10-CM | POA: Diagnosis not present

## 2018-06-23 DIAGNOSIS — C7951 Secondary malignant neoplasm of bone: Secondary | ICD-10-CM | POA: Diagnosis not present

## 2018-06-23 DIAGNOSIS — J440 Chronic obstructive pulmonary disease with acute lower respiratory infection: Secondary | ICD-10-CM | POA: Diagnosis not present

## 2018-06-23 DIAGNOSIS — J188 Other pneumonia, unspecified organism: Secondary | ICD-10-CM | POA: Diagnosis not present

## 2018-06-23 DIAGNOSIS — C3490 Malignant neoplasm of unspecified part of unspecified bronchus or lung: Secondary | ICD-10-CM | POA: Diagnosis not present

## 2018-06-23 DIAGNOSIS — I129 Hypertensive chronic kidney disease with stage 1 through stage 4 chronic kidney disease, or unspecified chronic kidney disease: Secondary | ICD-10-CM | POA: Diagnosis not present

## 2018-06-25 DIAGNOSIS — C3490 Malignant neoplasm of unspecified part of unspecified bronchus or lung: Secondary | ICD-10-CM | POA: Diagnosis not present

## 2018-06-25 DIAGNOSIS — I129 Hypertensive chronic kidney disease with stage 1 through stage 4 chronic kidney disease, or unspecified chronic kidney disease: Secondary | ICD-10-CM | POA: Diagnosis not present

## 2018-06-25 DIAGNOSIS — C7951 Secondary malignant neoplasm of bone: Secondary | ICD-10-CM | POA: Diagnosis not present

## 2018-06-25 DIAGNOSIS — J188 Other pneumonia, unspecified organism: Secondary | ICD-10-CM | POA: Diagnosis not present

## 2018-06-25 DIAGNOSIS — D61818 Other pancytopenia: Secondary | ICD-10-CM | POA: Diagnosis not present

## 2018-06-25 DIAGNOSIS — J440 Chronic obstructive pulmonary disease with acute lower respiratory infection: Secondary | ICD-10-CM | POA: Diagnosis not present

## 2018-06-28 DIAGNOSIS — C7951 Secondary malignant neoplasm of bone: Secondary | ICD-10-CM | POA: Diagnosis not present

## 2018-06-28 DIAGNOSIS — C3490 Malignant neoplasm of unspecified part of unspecified bronchus or lung: Secondary | ICD-10-CM | POA: Diagnosis not present

## 2018-06-28 DIAGNOSIS — I129 Hypertensive chronic kidney disease with stage 1 through stage 4 chronic kidney disease, or unspecified chronic kidney disease: Secondary | ICD-10-CM | POA: Diagnosis not present

## 2018-06-28 DIAGNOSIS — J440 Chronic obstructive pulmonary disease with acute lower respiratory infection: Secondary | ICD-10-CM | POA: Diagnosis not present

## 2018-06-28 DIAGNOSIS — D61818 Other pancytopenia: Secondary | ICD-10-CM | POA: Diagnosis not present

## 2018-06-28 DIAGNOSIS — J188 Other pneumonia, unspecified organism: Secondary | ICD-10-CM | POA: Diagnosis not present

## 2018-06-29 DIAGNOSIS — D61818 Other pancytopenia: Secondary | ICD-10-CM | POA: Diagnosis not present

## 2018-06-29 DIAGNOSIS — C7951 Secondary malignant neoplasm of bone: Secondary | ICD-10-CM | POA: Diagnosis not present

## 2018-06-29 DIAGNOSIS — I129 Hypertensive chronic kidney disease with stage 1 through stage 4 chronic kidney disease, or unspecified chronic kidney disease: Secondary | ICD-10-CM | POA: Diagnosis not present

## 2018-06-29 DIAGNOSIS — J188 Other pneumonia, unspecified organism: Secondary | ICD-10-CM | POA: Diagnosis not present

## 2018-06-29 DIAGNOSIS — J440 Chronic obstructive pulmonary disease with acute lower respiratory infection: Secondary | ICD-10-CM | POA: Diagnosis not present

## 2018-06-29 DIAGNOSIS — C3490 Malignant neoplasm of unspecified part of unspecified bronchus or lung: Secondary | ICD-10-CM | POA: Diagnosis not present

## 2018-06-30 DIAGNOSIS — J440 Chronic obstructive pulmonary disease with acute lower respiratory infection: Secondary | ICD-10-CM | POA: Diagnosis not present

## 2018-06-30 DIAGNOSIS — J188 Other pneumonia, unspecified organism: Secondary | ICD-10-CM | POA: Diagnosis not present

## 2018-06-30 DIAGNOSIS — C3490 Malignant neoplasm of unspecified part of unspecified bronchus or lung: Secondary | ICD-10-CM | POA: Diagnosis not present

## 2018-06-30 DIAGNOSIS — I129 Hypertensive chronic kidney disease with stage 1 through stage 4 chronic kidney disease, or unspecified chronic kidney disease: Secondary | ICD-10-CM | POA: Diagnosis not present

## 2018-06-30 DIAGNOSIS — C7951 Secondary malignant neoplasm of bone: Secondary | ICD-10-CM | POA: Diagnosis not present

## 2018-06-30 DIAGNOSIS — D61818 Other pancytopenia: Secondary | ICD-10-CM | POA: Diagnosis not present

## 2018-07-01 DIAGNOSIS — I129 Hypertensive chronic kidney disease with stage 1 through stage 4 chronic kidney disease, or unspecified chronic kidney disease: Secondary | ICD-10-CM | POA: Diagnosis not present

## 2018-07-01 DIAGNOSIS — J440 Chronic obstructive pulmonary disease with acute lower respiratory infection: Secondary | ICD-10-CM | POA: Diagnosis not present

## 2018-07-01 DIAGNOSIS — C7951 Secondary malignant neoplasm of bone: Secondary | ICD-10-CM | POA: Diagnosis not present

## 2018-07-01 DIAGNOSIS — J188 Other pneumonia, unspecified organism: Secondary | ICD-10-CM | POA: Diagnosis not present

## 2018-07-01 DIAGNOSIS — C3490 Malignant neoplasm of unspecified part of unspecified bronchus or lung: Secondary | ICD-10-CM | POA: Diagnosis not present

## 2018-07-01 DIAGNOSIS — D61818 Other pancytopenia: Secondary | ICD-10-CM | POA: Diagnosis not present

## 2018-07-02 DIAGNOSIS — C3490 Malignant neoplasm of unspecified part of unspecified bronchus or lung: Secondary | ICD-10-CM | POA: Diagnosis not present

## 2018-07-02 DIAGNOSIS — D61818 Other pancytopenia: Secondary | ICD-10-CM | POA: Diagnosis not present

## 2018-07-02 DIAGNOSIS — I129 Hypertensive chronic kidney disease with stage 1 through stage 4 chronic kidney disease, or unspecified chronic kidney disease: Secondary | ICD-10-CM | POA: Diagnosis not present

## 2018-07-02 DIAGNOSIS — J188 Other pneumonia, unspecified organism: Secondary | ICD-10-CM | POA: Diagnosis not present

## 2018-07-02 DIAGNOSIS — J440 Chronic obstructive pulmonary disease with acute lower respiratory infection: Secondary | ICD-10-CM | POA: Diagnosis not present

## 2018-07-02 DIAGNOSIS — C7951 Secondary malignant neoplasm of bone: Secondary | ICD-10-CM | POA: Diagnosis not present

## 2018-07-06 DIAGNOSIS — C3490 Malignant neoplasm of unspecified part of unspecified bronchus or lung: Secondary | ICD-10-CM | POA: Diagnosis not present

## 2018-07-06 DIAGNOSIS — J188 Other pneumonia, unspecified organism: Secondary | ICD-10-CM | POA: Diagnosis not present

## 2018-07-06 DIAGNOSIS — C7951 Secondary malignant neoplasm of bone: Secondary | ICD-10-CM | POA: Diagnosis not present

## 2018-07-06 DIAGNOSIS — I129 Hypertensive chronic kidney disease with stage 1 through stage 4 chronic kidney disease, or unspecified chronic kidney disease: Secondary | ICD-10-CM | POA: Diagnosis not present

## 2018-07-06 DIAGNOSIS — D61818 Other pancytopenia: Secondary | ICD-10-CM | POA: Diagnosis not present

## 2018-07-06 DIAGNOSIS — J440 Chronic obstructive pulmonary disease with acute lower respiratory infection: Secondary | ICD-10-CM | POA: Diagnosis not present

## 2018-07-07 DIAGNOSIS — I129 Hypertensive chronic kidney disease with stage 1 through stage 4 chronic kidney disease, or unspecified chronic kidney disease: Secondary | ICD-10-CM | POA: Diagnosis not present

## 2018-07-07 DIAGNOSIS — J188 Other pneumonia, unspecified organism: Secondary | ICD-10-CM | POA: Diagnosis not present

## 2018-07-07 DIAGNOSIS — D61818 Other pancytopenia: Secondary | ICD-10-CM | POA: Diagnosis not present

## 2018-07-07 DIAGNOSIS — C3490 Malignant neoplasm of unspecified part of unspecified bronchus or lung: Secondary | ICD-10-CM | POA: Diagnosis not present

## 2018-07-07 DIAGNOSIS — J440 Chronic obstructive pulmonary disease with acute lower respiratory infection: Secondary | ICD-10-CM | POA: Diagnosis not present

## 2018-07-07 DIAGNOSIS — C7951 Secondary malignant neoplasm of bone: Secondary | ICD-10-CM | POA: Diagnosis not present

## 2018-07-09 ENCOUNTER — Telehealth: Payer: Self-pay | Admitting: Family Medicine

## 2018-07-09 NOTE — Telephone Encounter (Signed)
[facility] calling to request verbal order for skilled nursing 1 time a week for 6 more weeks.

## 2018-07-12 DIAGNOSIS — C7951 Secondary malignant neoplasm of bone: Secondary | ICD-10-CM | POA: Diagnosis not present

## 2018-07-12 DIAGNOSIS — N183 Chronic kidney disease, stage 3 (moderate): Secondary | ICD-10-CM | POA: Diagnosis not present

## 2018-07-12 DIAGNOSIS — I129 Hypertensive chronic kidney disease with stage 1 through stage 4 chronic kidney disease, or unspecified chronic kidney disease: Secondary | ICD-10-CM | POA: Diagnosis not present

## 2018-07-12 DIAGNOSIS — D61818 Other pancytopenia: Secondary | ICD-10-CM | POA: Diagnosis not present

## 2018-07-12 DIAGNOSIS — E785 Hyperlipidemia, unspecified: Secondary | ICD-10-CM | POA: Diagnosis not present

## 2018-07-12 DIAGNOSIS — F411 Generalized anxiety disorder: Secondary | ICD-10-CM | POA: Diagnosis not present

## 2018-07-12 DIAGNOSIS — E1122 Type 2 diabetes mellitus with diabetic chronic kidney disease: Secondary | ICD-10-CM | POA: Diagnosis not present

## 2018-07-12 DIAGNOSIS — I4891 Unspecified atrial fibrillation: Secondary | ICD-10-CM | POA: Diagnosis not present

## 2018-07-12 DIAGNOSIS — Z9981 Dependence on supplemental oxygen: Secondary | ICD-10-CM | POA: Diagnosis not present

## 2018-07-12 DIAGNOSIS — Z87891 Personal history of nicotine dependence: Secondary | ICD-10-CM | POA: Diagnosis not present

## 2018-07-12 DIAGNOSIS — J188 Other pneumonia, unspecified organism: Secondary | ICD-10-CM | POA: Diagnosis not present

## 2018-07-12 DIAGNOSIS — J440 Chronic obstructive pulmonary disease with acute lower respiratory infection: Secondary | ICD-10-CM | POA: Diagnosis not present

## 2018-07-12 DIAGNOSIS — Z8673 Personal history of transient ischemic attack (TIA), and cerebral infarction without residual deficits: Secondary | ICD-10-CM | POA: Diagnosis not present

## 2018-07-12 DIAGNOSIS — E1151 Type 2 diabetes mellitus with diabetic peripheral angiopathy without gangrene: Secondary | ICD-10-CM | POA: Diagnosis not present

## 2018-07-12 DIAGNOSIS — I2510 Atherosclerotic heart disease of native coronary artery without angina pectoris: Secondary | ICD-10-CM | POA: Diagnosis not present

## 2018-07-12 DIAGNOSIS — I252 Old myocardial infarction: Secondary | ICD-10-CM | POA: Diagnosis not present

## 2018-07-12 DIAGNOSIS — K219 Gastro-esophageal reflux disease without esophagitis: Secondary | ICD-10-CM | POA: Diagnosis not present

## 2018-07-12 DIAGNOSIS — C3490 Malignant neoplasm of unspecified part of unspecified bronchus or lung: Secondary | ICD-10-CM | POA: Diagnosis not present

## 2018-07-12 NOTE — Telephone Encounter (Signed)
Called [facility] and VO given

## 2018-07-13 DIAGNOSIS — C3490 Malignant neoplasm of unspecified part of unspecified bronchus or lung: Secondary | ICD-10-CM | POA: Diagnosis not present

## 2018-07-13 DIAGNOSIS — I129 Hypertensive chronic kidney disease with stage 1 through stage 4 chronic kidney disease, or unspecified chronic kidney disease: Secondary | ICD-10-CM | POA: Diagnosis not present

## 2018-07-13 DIAGNOSIS — C7951 Secondary malignant neoplasm of bone: Secondary | ICD-10-CM | POA: Diagnosis not present

## 2018-07-13 DIAGNOSIS — J440 Chronic obstructive pulmonary disease with acute lower respiratory infection: Secondary | ICD-10-CM | POA: Diagnosis not present

## 2018-07-13 DIAGNOSIS — J188 Other pneumonia, unspecified organism: Secondary | ICD-10-CM | POA: Diagnosis not present

## 2018-07-13 DIAGNOSIS — D61818 Other pancytopenia: Secondary | ICD-10-CM | POA: Diagnosis not present

## 2018-07-15 ENCOUNTER — Other Ambulatory Visit: Payer: Self-pay | Admitting: Family Medicine

## 2018-07-15 MED ORDER — ALPRAZOLAM 0.5 MG PO TABS: 0.5000 mg | ORAL_TABLET | Freq: Every day | ORAL | 0 refills | Status: DC

## 2018-07-15 MED ORDER — PREDNISONE 10 MG PO TABS: 10.0000 mg | ORAL_TABLET | Freq: Every day | ORAL | 3 refills | Status: DC

## 2018-07-15 NOTE — Telephone Encounter (Signed)
Pt is requesting refill on Xanax  & Prednisone (I do not know if he should be taking this daily or not - rx was set up if he is)   LOV: 03/30/18  LRF:   05/21/18 (Xanax) 05/16/18 (prednisone)

## 2018-07-19 DIAGNOSIS — C3490 Malignant neoplasm of unspecified part of unspecified bronchus or lung: Secondary | ICD-10-CM | POA: Diagnosis not present

## 2018-07-19 DIAGNOSIS — J440 Chronic obstructive pulmonary disease with acute lower respiratory infection: Secondary | ICD-10-CM | POA: Diagnosis not present

## 2018-07-19 DIAGNOSIS — J188 Other pneumonia, unspecified organism: Secondary | ICD-10-CM | POA: Diagnosis not present

## 2018-07-19 DIAGNOSIS — C7951 Secondary malignant neoplasm of bone: Secondary | ICD-10-CM | POA: Diagnosis not present

## 2018-07-19 DIAGNOSIS — D61818 Other pancytopenia: Secondary | ICD-10-CM | POA: Diagnosis not present

## 2018-07-19 DIAGNOSIS — I129 Hypertensive chronic kidney disease with stage 1 through stage 4 chronic kidney disease, or unspecified chronic kidney disease: Secondary | ICD-10-CM | POA: Diagnosis not present

## 2018-07-23 DIAGNOSIS — C3490 Malignant neoplasm of unspecified part of unspecified bronchus or lung: Secondary | ICD-10-CM | POA: Diagnosis not present

## 2018-07-23 DIAGNOSIS — D61818 Other pancytopenia: Secondary | ICD-10-CM | POA: Diagnosis not present

## 2018-07-23 DIAGNOSIS — J440 Chronic obstructive pulmonary disease with acute lower respiratory infection: Secondary | ICD-10-CM | POA: Diagnosis not present

## 2018-07-23 DIAGNOSIS — C7951 Secondary malignant neoplasm of bone: Secondary | ICD-10-CM | POA: Diagnosis not present

## 2018-07-23 DIAGNOSIS — I129 Hypertensive chronic kidney disease with stage 1 through stage 4 chronic kidney disease, or unspecified chronic kidney disease: Secondary | ICD-10-CM | POA: Diagnosis not present

## 2018-07-23 DIAGNOSIS — J188 Other pneumonia, unspecified organism: Secondary | ICD-10-CM | POA: Diagnosis not present

## 2018-07-26 ENCOUNTER — Ambulatory Visit (INDEPENDENT_AMBULATORY_CARE_PROVIDER_SITE_OTHER): Payer: Medicare Other | Admitting: Family Medicine

## 2018-07-26 ENCOUNTER — Other Ambulatory Visit: Payer: Self-pay

## 2018-07-26 DIAGNOSIS — I6930 Unspecified sequelae of cerebral infarction: Secondary | ICD-10-CM | POA: Diagnosis not present

## 2018-07-26 DIAGNOSIS — C7951 Secondary malignant neoplasm of bone: Secondary | ICD-10-CM

## 2018-07-26 DIAGNOSIS — C3491 Malignant neoplasm of unspecified part of right bronchus or lung: Secondary | ICD-10-CM

## 2018-07-26 DIAGNOSIS — I639 Cerebral infarction, unspecified: Secondary | ICD-10-CM

## 2018-07-26 DIAGNOSIS — R531 Weakness: Secondary | ICD-10-CM

## 2018-07-26 NOTE — Progress Notes (Signed)
Subjective:    Patient ID: Martin [name], male    DOB: June 29, 1930, 85 y.o.   MRN: [id number]  HPI Patient is being seen today via telephone.  He consents to be seen by telephone.  Patient is currently at home lying in bed.  I am currently my office.  Phone call began at 2:00 in the afternoon.  Phone call concluded at 215.  Patient was taking a nap when I called him.  Patient is somewhat groggy however he states that he call me regarding weakness.  He is battling stage IV metastatic lung cancer with mets to his right sacrum.  He reports pain in his posterior right hip and in his right leg behind his right knee.  He is having to use pain medication during the day to help control the pain however he has been avoiding the pain medication due to fear of constipation.  Whenever he takes pain medication he gets constipated.  He has not tried taking it with a stool softener such as MiraLAX.  He states that he does have MiraLAX at home.  As long as he does not use the pain medication however he does not need the MiraLAX.  His biggest concern is weakness and lightheadedness and dizziness when he stands.  He denies any vertigo.  When he stands up he feels very lightheaded like he may pass out.  At last for a few seconds and gradually subsides.  He also feels very tired and weak.  His blood pressure yesterday was 137/80.  However he states that his heart rate is in the 50s when he checks it.  He denies any chest pain.  He denies any pleurisy.  He does report a cough productive of yellow sputum this is chronic.  He does occasionally have blood-tinged sputum.  However he denies any shortness of breath or fevers or wheezing. Past Medical History:  Diagnosis Date  . Arthritis   . Cancer ([place])   . CKD (chronic kidney disease) stage 3, GFR 30-59 ml/min (HCC)   . COPD (chronic obstructive pulmonary disease) ([place])   . Diabetes mellitus    denies, reports resolved with diet  . GERD (gastroesophageal reflux disease)   .  H/O: GI bleed   . Hiatal hernia   . Hyperlipidemia   . Hypertension   . Myocardial infarction [facility])    1978 & 2011  . Peripheral vascular disease, unspecified ([place])   . Renal artery stenosis ([place])   . Stroke  [facility])    Past Surgical History:  Procedure Laterality Date  . Aortobifemoral BPG  07/22/10   and Right femoral-popliteal BPG   Current Outpatient Medications on File Prior to Visit  Medication Sig Dispense Refill  . ALPRAZolam (XANAX) 0.5 MG tablet Take 1 tablet (0.5 mg total) by mouth at bedtime. 30 tablet 0  . atorvastatin (LIPITOR) 20 MG tablet TAKE 1 TABLET BY MOUTH ONCE DAILY AT  6PM. (Patient taking differently: Take 20 mg by mouth daily. ) 90 tablet 0  . budesonide-formoterol (SYMBICORT) 160-4.5 MCG/ACT inhaler Inhale 2 puffs into the lungs 2 (two) times daily as needed (for COPD flares).    . famotidine (PEPCID) 20 MG tablet TAKE 1 TABLET BY MOUTH TWICE DAILY (Patient not taking: No sig reported) 60 tablet 5  . HYDROcodone-acetaminophen (NORCO/VICODIN) 5-325 MG tablet Take 1 tablet by mouth every 6 (six) hours as needed for moderate pain. 10 tablet 0  . loperamide (IMODIUM) 2 MG capsule Take 1 capsule (2 mg total) by  mouth 3 (three) times daily as needed for diarrhea or loose stools. 30 capsule 0  . metoprolol succinate (TOPROL-XL) 25 MG 24 hr tablet TAKE 1 TABLET BY MOUTH ONCE DAILY (Patient taking differently: Take 25 mg by mouth daily. ) 90 tablet 2  . NITROSTAT 0.4 MG SL tablet DISSOLVE ONE TABLET UNDER THE TONGUE AS DIRECTED (Patient taking differently: Place 0.4 mg under the tongue every 5 (five) minutes as needed for chest pain. Max 3 doses) 25 tablet 0  . ondansetron (ZOFRAN ODT) 4 MG disintegrating tablet Take 1 tablet (4 mg total) by mouth every 8 (eight) hours as needed for nausea or vomiting. 20 tablet 0  . polyethylene glycol (MIRALAX / GLYCOLAX) packet Take 17 g by mouth daily as needed for moderate constipation or severe constipation. 14 each 0  . predniSONE  (DELTASONE) 10 MG tablet Take 1 tablet (10 mg total) by mouth daily with breakfast. 30 tablet 3  . traZODone (DESYREL) 25 mg TABS tablet Take 25 mg by mouth at bedtime.     No current facility-administered medications on file prior to visit.    Allergies  Allergen Reactions  . Penicillins Rash    DID THE REACTION INVOLVE: Swelling of the face/tongue/throat, SOB, or low BP? Yes Sudden or severe rash/hives, skin peeling, or the inside of the mouth or nose? No Did it require medical treatment? Was already in hosp When did it last happen?40 years ago If all above answers are "NO", may proceed with cephalosporin use.    Social History   Socioeconomic History  . Marital status: Widowed    Spouse name: Not on file  . Number of children: Not on file  . Years of education: Not on file  . Highest education level: Not on file  Occupational History  . Occupation: retired  Scientific laboratory technician  . Financial resource strain: Not on file  . Food insecurity:    Worry: Not on file    Inability: Not on file  . Transportation needs:    Medical: Not on file    Non-medical: Not on file  Tobacco Use  . Smoking status: Former Smoker    Packs/day: 1.00    Years: 65.00    Pack years: 65.00    Types: Cigarettes    Last attempt to quit: 07/08/2009    Years since quitting: 9.0  . Smokeless tobacco: Never Used  Substance and Sexual Activity  . Alcohol use: No  . Drug use: No  . Sexual activity: Not on file  Lifestyle  . Physical activity:    Days per week: Not on file    Minutes per session: Not on file  . Stress: Not on file  Relationships  . Social connections:    Talks on phone: Not on file    Gets together: Not on file    Attends religious service: Not on file    Active member of club or organization: Not on file    Attends meetings of clubs or organizations: Not on file    Relationship status: Not on file  . Intimate partner violence:    Fear of current or ex partner: Not on file     Emotionally abused: Not on file    Physically abused: Not on file    Forced sexual activity: Not on file  Other Topics Concern  . Not on file  Social History Narrative  . Not on file      Review of Systems  Constitutional: Positive for fatigue. Negative  for chills, diaphoresis and fever.  HENT: Positive for congestion.   Respiratory: Positive for cough. Negative for chest tightness, shortness of breath, wheezing and stridor.   Cardiovascular: Negative for chest pain and leg swelling.  Gastrointestinal: Negative for abdominal distention and abdominal pain.  Musculoskeletal: Positive for arthralgias, back pain and myalgias.  Neurological: Positive for dizziness, weakness and light-headedness. Negative for tremors.  All other systems reviewed and are negative.      Objective:   Physical Exam   No physical exam was performed today as the patient was seen over the telephone     Assessment & Plan:  Bone metastasis ([place])  Primary malignant neoplasm of right lung metastatic to other site [facility])  Cerebellar stroke ([place])  Generalized weakness  Patient is aware that his condition is terminal.  He questions me that if his lung cancer gradually making him weaker.  I believe that is possible coupled with his advanced age.  However his heart rate seems extremely low and this may be causing some orthostatic dizziness as well.  That coupled with his previous history of cerebellar stroke is likely causing his dizziness and disequilibrium with standing.  Therefore I recommended that he discontinue metoprolol temporarily to see if this helps with his orthostatic dizziness and weakness.  I do not feel that this will cure the situation but if it may ameliorate some of his symptoms will be worthwhile.  Also explained the patient that I do not want him suffering with the pain in the bones particularly in his lower back and his right sacrum.  I explained to him that he could take MiraLAX with the pain  medication to help prevent the constipation associated with hydrocodone.  Patient states that he will try that.

## 2018-07-27 DIAGNOSIS — I129 Hypertensive chronic kidney disease with stage 1 through stage 4 chronic kidney disease, or unspecified chronic kidney disease: Secondary | ICD-10-CM | POA: Diagnosis not present

## 2018-07-27 DIAGNOSIS — C7951 Secondary malignant neoplasm of bone: Secondary | ICD-10-CM | POA: Diagnosis not present

## 2018-07-27 DIAGNOSIS — J440 Chronic obstructive pulmonary disease with acute lower respiratory infection: Secondary | ICD-10-CM | POA: Diagnosis not present

## 2018-07-27 DIAGNOSIS — C3490 Malignant neoplasm of unspecified part of unspecified bronchus or lung: Secondary | ICD-10-CM | POA: Diagnosis not present

## 2018-07-27 DIAGNOSIS — J188 Other pneumonia, unspecified organism: Secondary | ICD-10-CM | POA: Diagnosis not present

## 2018-07-27 DIAGNOSIS — D61818 Other pancytopenia: Secondary | ICD-10-CM | POA: Diagnosis not present

## 2018-07-30 DIAGNOSIS — I129 Hypertensive chronic kidney disease with stage 1 through stage 4 chronic kidney disease, or unspecified chronic kidney disease: Secondary | ICD-10-CM | POA: Diagnosis not present

## 2018-07-30 DIAGNOSIS — J188 Other pneumonia, unspecified organism: Secondary | ICD-10-CM | POA: Diagnosis not present

## 2018-07-30 DIAGNOSIS — D61818 Other pancytopenia: Secondary | ICD-10-CM | POA: Diagnosis not present

## 2018-07-30 DIAGNOSIS — C7951 Secondary malignant neoplasm of bone: Secondary | ICD-10-CM | POA: Diagnosis not present

## 2018-07-30 DIAGNOSIS — C3490 Malignant neoplasm of unspecified part of unspecified bronchus or lung: Secondary | ICD-10-CM | POA: Diagnosis not present

## 2018-07-30 DIAGNOSIS — J440 Chronic obstructive pulmonary disease with acute lower respiratory infection: Secondary | ICD-10-CM | POA: Diagnosis not present

## 2018-08-03 ENCOUNTER — Inpatient Hospital Stay (HOSPITAL_COMMUNITY)
Admission: EM | Admit: 2018-08-03 | Discharge: 2018-08-08 | DRG: 190 | Disposition: A | Discharge status: Hospice | Payer: Medicare Other | Attending: Internal Medicine | Admitting: Internal Medicine

## 2018-08-03 ENCOUNTER — Telehealth: Payer: Self-pay | Admitting: Medical Oncology

## 2018-08-03 ENCOUNTER — Emergency Department (HOSPITAL_COMMUNITY): Payer: Medicare Other

## 2018-08-03 ENCOUNTER — Other Ambulatory Visit: Payer: Self-pay

## 2018-08-03 ENCOUNTER — Encounter (HOSPITAL_COMMUNITY): Payer: Self-pay

## 2018-08-03 DIAGNOSIS — R042 Hemoptysis: Secondary | ICD-10-CM | POA: Diagnosis present

## 2018-08-03 DIAGNOSIS — I10 Essential (primary) hypertension: Secondary | ICD-10-CM

## 2018-08-03 DIAGNOSIS — Z515 Encounter for palliative care: Secondary | ICD-10-CM | POA: Diagnosis not present

## 2018-08-03 DIAGNOSIS — C3490 Malignant neoplasm of unspecified part of unspecified bronchus or lung: Secondary | ICD-10-CM | POA: Diagnosis not present

## 2018-08-03 DIAGNOSIS — E1165 Type 2 diabetes mellitus with hyperglycemia: Secondary | ICD-10-CM | POA: Diagnosis not present

## 2018-08-03 DIAGNOSIS — R7989 Other specified abnormal findings of blood chemistry: Secondary | ICD-10-CM | POA: Diagnosis not present

## 2018-08-03 DIAGNOSIS — E785 Hyperlipidemia, unspecified: Secondary | ICD-10-CM | POA: Diagnosis present

## 2018-08-03 DIAGNOSIS — D539 Nutritional anemia, unspecified: Secondary | ICD-10-CM | POA: Diagnosis present

## 2018-08-03 DIAGNOSIS — I701 Atherosclerosis of renal artery: Secondary | ICD-10-CM | POA: Diagnosis present

## 2018-08-03 DIAGNOSIS — Y95 Nosocomial condition: Secondary | ICD-10-CM | POA: Diagnosis present

## 2018-08-03 DIAGNOSIS — Z20828 Contact with and (suspected) exposure to other viral communicable diseases: Secondary | ICD-10-CM | POA: Diagnosis not present

## 2018-08-03 DIAGNOSIS — Z79891 Long term (current) use of opiate analgesic: Secondary | ICD-10-CM

## 2018-08-03 DIAGNOSIS — K219 Gastro-esophageal reflux disease without esophagitis: Secondary | ICD-10-CM | POA: Diagnosis present

## 2018-08-03 DIAGNOSIS — T380X5A Adverse effect of glucocorticoids and synthetic analogues, initial encounter: Secondary | ICD-10-CM | POA: Diagnosis present

## 2018-08-03 DIAGNOSIS — C7951 Secondary malignant neoplasm of bone: Secondary | ICD-10-CM | POA: Diagnosis present

## 2018-08-03 DIAGNOSIS — J9621 Acute and chronic respiratory failure with hypoxia: Secondary | ICD-10-CM | POA: Diagnosis present

## 2018-08-03 DIAGNOSIS — R627 Adult failure to thrive: Secondary | ICD-10-CM | POA: Diagnosis present

## 2018-08-03 DIAGNOSIS — F411 Generalized anxiety disorder: Secondary | ICD-10-CM | POA: Diagnosis not present

## 2018-08-03 DIAGNOSIS — R778 Other specified abnormalities of plasma proteins: Secondary | ICD-10-CM | POA: Diagnosis present

## 2018-08-03 DIAGNOSIS — Z8673 Personal history of transient ischemic attack (TIA), and cerebral infarction without residual deficits: Secondary | ICD-10-CM

## 2018-08-03 DIAGNOSIS — J189 Pneumonia, unspecified organism: Secondary | ICD-10-CM | POA: Diagnosis not present

## 2018-08-03 DIAGNOSIS — Z95820 Peripheral vascular angioplasty status with implants and grafts: Secondary | ICD-10-CM

## 2018-08-03 DIAGNOSIS — C782 Secondary malignant neoplasm of pleura: Secondary | ICD-10-CM | POA: Diagnosis present

## 2018-08-03 DIAGNOSIS — R0602 Shortness of breath: Secondary | ICD-10-CM

## 2018-08-03 DIAGNOSIS — I252 Old myocardial infarction: Secondary | ICD-10-CM

## 2018-08-03 DIAGNOSIS — I129 Hypertensive chronic kidney disease with stage 1 through stage 4 chronic kidney disease, or unspecified chronic kidney disease: Secondary | ICD-10-CM | POA: Diagnosis present

## 2018-08-03 DIAGNOSIS — Z9981 Dependence on supplemental oxygen: Secondary | ICD-10-CM

## 2018-08-03 DIAGNOSIS — J441 Chronic obstructive pulmonary disease with (acute) exacerbation: Secondary | ICD-10-CM | POA: Diagnosis not present

## 2018-08-03 DIAGNOSIS — E119 Type 2 diabetes mellitus without complications: Secondary | ICD-10-CM

## 2018-08-03 DIAGNOSIS — Z888 Allergy status to other drugs, medicaments and biological substances status: Secondary | ICD-10-CM

## 2018-08-03 DIAGNOSIS — J9601 Acute respiratory failure with hypoxia: Secondary | ICD-10-CM | POA: Diagnosis not present

## 2018-08-03 DIAGNOSIS — K5900 Constipation, unspecified: Secondary | ICD-10-CM | POA: Diagnosis present

## 2018-08-03 DIAGNOSIS — J440 Chronic obstructive pulmonary disease with acute lower respiratory infection: Secondary | ICD-10-CM | POA: Diagnosis not present

## 2018-08-03 DIAGNOSIS — E1151 Type 2 diabetes mellitus with diabetic peripheral angiopathy without gangrene: Secondary | ICD-10-CM | POA: Diagnosis present

## 2018-08-03 DIAGNOSIS — D631 Anemia in chronic kidney disease: Secondary | ICD-10-CM | POA: Diagnosis present

## 2018-08-03 DIAGNOSIS — D61818 Other pancytopenia: Secondary | ICD-10-CM | POA: Diagnosis not present

## 2018-08-03 DIAGNOSIS — Z7951 Long term (current) use of inhaled steroids: Secondary | ICD-10-CM

## 2018-08-03 DIAGNOSIS — H9190 Unspecified hearing loss, unspecified ear: Secondary | ICD-10-CM | POA: Diagnosis present

## 2018-08-03 DIAGNOSIS — J449 Chronic obstructive pulmonary disease, unspecified: Secondary | ICD-10-CM | POA: Diagnosis present

## 2018-08-03 DIAGNOSIS — Z87891 Personal history of nicotine dependence: Secondary | ICD-10-CM

## 2018-08-03 DIAGNOSIS — Z79899 Other long term (current) drug therapy: Secondary | ICD-10-CM

## 2018-08-03 DIAGNOSIS — R05 Cough: Secondary | ICD-10-CM | POA: Diagnosis not present

## 2018-08-03 DIAGNOSIS — Z7902 Long term (current) use of antithrombotics/antiplatelets: Secondary | ICD-10-CM

## 2018-08-03 DIAGNOSIS — Z66 Do not resuscitate: Secondary | ICD-10-CM | POA: Diagnosis not present

## 2018-08-03 DIAGNOSIS — I4891 Unspecified atrial fibrillation: Secondary | ICD-10-CM | POA: Diagnosis present

## 2018-08-03 DIAGNOSIS — C3411 Malignant neoplasm of upper lobe, right bronchus or lung: Secondary | ICD-10-CM | POA: Diagnosis present

## 2018-08-03 DIAGNOSIS — E1122 Type 2 diabetes mellitus with diabetic chronic kidney disease: Secondary | ICD-10-CM | POA: Diagnosis present

## 2018-08-03 DIAGNOSIS — J188 Other pneumonia, unspecified organism: Secondary | ICD-10-CM | POA: Diagnosis not present

## 2018-08-03 DIAGNOSIS — Z923 Personal history of irradiation: Secondary | ICD-10-CM

## 2018-08-03 DIAGNOSIS — I2510 Atherosclerotic heart disease of native coronary artery without angina pectoris: Secondary | ICD-10-CM | POA: Diagnosis present

## 2018-08-03 DIAGNOSIS — Z9221 Personal history of antineoplastic chemotherapy: Secondary | ICD-10-CM

## 2018-08-03 DIAGNOSIS — Z209 Contact with and (suspected) exposure to unspecified communicable disease: Secondary | ICD-10-CM | POA: Diagnosis not present

## 2018-08-03 DIAGNOSIS — R531 Weakness: Secondary | ICD-10-CM | POA: Diagnosis not present

## 2018-08-03 DIAGNOSIS — I639 Cerebral infarction, unspecified: Secondary | ICD-10-CM | POA: Diagnosis present

## 2018-08-03 DIAGNOSIS — N183 Chronic kidney disease, stage 3 (moderate): Secondary | ICD-10-CM | POA: Diagnosis present

## 2018-08-03 DIAGNOSIS — I700 Atherosclerosis of aorta: Secondary | ICD-10-CM | POA: Diagnosis present

## 2018-08-03 DIAGNOSIS — Z6822 Body mass index (BMI) 22.0-22.9, adult: Secondary | ICD-10-CM

## 2018-08-03 LAB — COMPREHENSIVE METABOLIC PANEL
ALT: 11 U/L (ref 0–44)
AST: 12 U/L — ABNORMAL LOW (ref 15–41)
Albumin: 2.2 g/dL — ABNORMAL LOW (ref 3.5–5.0)
Alkaline Phosphatase: 118 U/L (ref 38–126)
Anion gap: 11 (ref 5–15)
BUN: 21 mg/dL (ref 8–23)
CO2: 28 mmol/L (ref 22–32)
Calcium: 8 mg/dL — ABNORMAL LOW (ref 8.9–10.3)
Chloride: 95 mmol/L — ABNORMAL LOW (ref 98–111)
Creatinine, Ser: 0.98 mg/dL (ref 0.61–1.24)
GFR calc Af Amer: >60 mL/min (ref >60)
GFR calc non Af Amer: >60 mL/min (ref >60)
Glucose, Bld: 494 mg/dL — ABNORMAL HIGH (ref 70–99)
Potassium: 3.7 mmol/L (ref 3.5–5.1)
Sodium: 134 mmol/L — ABNORMAL LOW (ref 135–145)
Total Bilirubin: 0.7 mg/dL (ref 0.3–1.2)
Total Protein: 5.6 g/dL — ABNORMAL LOW (ref 6.5–8.1)

## 2018-08-03 LAB — CBC WITH DIFFERENTIAL/PLATELET
Abs Immature Granulocytes: 0.12 10*3/uL — ABNORMAL HIGH (ref 0.00–0.07)
Basophils Absolute: 0 10*3/uL (ref 0.0–0.1)
Basophils Relative: 0 %
Eosinophils Absolute: 0 10*3/uL (ref 0.0–0.5)
Eosinophils Relative: 0 %
HCT: 31.4 % — ABNORMAL LOW (ref 39.0–52.0)
Hemoglobin: 9.5 g/dL — ABNORMAL LOW (ref 13.0–17.0)
Immature Granulocytes: 1 %
Lymphocytes Relative: 2 %
Lymphs Abs: 0.4 10*3/uL — ABNORMAL LOW (ref 0.7–4.0)
MCH: 31.7 pg (ref 26.0–34.0)
MCHC: 30.3 g/dL (ref 30.0–36.0)
MCV: 104.7 fL — ABNORMAL HIGH (ref 80.0–100.0)
Monocytes Absolute: 0.5 10*3/uL (ref 0.1–1.0)
Monocytes Relative: 3 %
Neutro Abs: 15.3 10*3/uL — ABNORMAL HIGH (ref 1.7–7.7)
Neutrophils Relative %: 94 %
Platelets: 188 10*3/uL (ref 150–400)
RBC: 3 MIL/uL — ABNORMAL LOW (ref 4.22–5.81)
RDW: 14.4 % (ref 11.5–15.5)
WBC: 16.3 10*3/uL — ABNORMAL HIGH (ref 4.0–10.5)
nRBC: 0 % (ref 0.0–0.2)

## 2018-08-03 LAB — URINALYSIS, ROUTINE W REFLEX MICROSCOPIC
Bacteria, UA: NONE SEEN
Bilirubin Urine: NEGATIVE
Glucose, UA: >=500 mg/dL — AB
Hgb urine dipstick: NEGATIVE
Ketones, ur: 5 mg/dL — AB
Leukocytes,Ua: NEGATIVE
Nitrite: NEGATIVE
Protein, ur: 30 mg/dL — AB
Specific Gravity, Urine: 1.022 (ref 1.005–1.030)
pH: 7 (ref 5.0–8.0)

## 2018-08-03 LAB — CBG MONITORING, ED: Glucose-Capillary: 427 mg/dL — ABNORMAL HIGH (ref 70–99)

## 2018-08-03 LAB — TROPONIN I
Troponin I: 0.03 ng/mL (ref <0.03)
Troponin I: <0.03 ng/mL (ref <0.03)

## 2018-08-03 LAB — BRAIN NATRIURETIC PEPTIDE: B Natriuretic Peptide: 259.9 pg/mL — ABNORMAL HIGH (ref 0.0–100.0)

## 2018-08-03 LAB — INFLUENZA PANEL BY PCR (TYPE A & B)
Influenza A By PCR: NEGATIVE
Influenza B By PCR: NEGATIVE

## 2018-08-03 LAB — LACTIC ACID, PLASMA
Lactic Acid, Venous: 1.7 mmol/L (ref 0.5–1.9)
Lactic Acid, Venous: 1 mmol/L (ref 0.5–1.9)

## 2018-08-03 LAB — SARS CORONAVIRUS 2 BY RT PCR (HOSPITAL ORDER, PERFORMED IN ~~LOC~~ HOSPITAL LAB): SARS Coronavirus 2: NEGATIVE

## 2018-08-03 MED ORDER — AZITHROMYCIN 250 MG PO TABS
250.0000 mg | ORAL_TABLET | Freq: Every day | ORAL | Status: AC
  Administered 2018-08-04 – 2018-08-07 (×4): 250 mg via ORAL
  Filled 2018-08-03 (×4): qty 1

## 2018-08-03 MED ORDER — AEROCHAMBER PLUS FLO-VU MISC: 1.0000 | Freq: Once | Status: DC

## 2018-08-03 MED ORDER — HYDROCODONE-ACETAMINOPHEN 5-325 MG PO TABS
1.0000 | ORAL_TABLET | Freq: Four times a day (QID) | ORAL | Status: DC | PRN
  Administered 2018-08-05 – 2018-08-08 (×4): 1 via ORAL
  Filled 2018-08-03 (×4): qty 1

## 2018-08-03 MED ORDER — HYDRALAZINE HCL 20 MG/ML IJ SOLN: 5.0000 mg | INTRAMUSCULAR | Status: DC | PRN

## 2018-08-03 MED ORDER — NITROGLYCERIN 0.4 MG SL SUBL: 0.4000 mg | SUBLINGUAL_TABLET | SUBLINGUAL | Status: DC | PRN

## 2018-08-03 MED ORDER — IPRATROPIUM-ALBUTEROL 0.5-2.5 (3) MG/3ML IN SOLN: 3.0000 mL | RESPIRATORY_TRACT | Status: DC

## 2018-08-03 MED ORDER — FAMOTIDINE 20 MG PO TABS
20.0000 mg | ORAL_TABLET | Freq: Two times a day (BID) | ORAL | Status: DC
  Administered 2018-08-03 – 2018-08-08 (×10): 20 mg via ORAL
  Filled 2018-08-03 (×10): qty 1

## 2018-08-03 MED ORDER — INSULIN GLARGINE 100 UNIT/ML ~~LOC~~ SOLN
5.0000 [IU] | Freq: Every day | SUBCUTANEOUS | Status: DC
  Administered 2018-08-04: 5 [IU] via SUBCUTANEOUS
  Filled 2018-08-03 (×2): qty 0.05

## 2018-08-03 MED ORDER — TRAZODONE HCL 50 MG PO TABS
25.0000 mg | ORAL_TABLET | Freq: Every day | ORAL | Status: DC
  Administered 2018-08-03 – 2018-08-07 (×5): 25 mg via ORAL
  Filled 2018-08-03 (×6): qty 1

## 2018-08-03 MED ORDER — INSULIN ASPART 100 UNIT/ML ~~LOC~~ SOLN
0.0000 [IU] | Freq: Three times a day (TID) | SUBCUTANEOUS | Status: DC
  Administered 2018-08-04: 7 [IU] via SUBCUTANEOUS

## 2018-08-03 MED ORDER — POLYETHYLENE GLYCOL 3350 17 G PO PACK
17.0000 g | PACK | Freq: Every day | ORAL | Status: DC | PRN
  Administered 2018-08-06: 17 g via ORAL
  Filled 2018-08-03: qty 1

## 2018-08-03 MED ORDER — DM-GUAIFENESIN ER 30-600 MG PO TB12: 1.0000 | ORAL_TABLET | Freq: Two times a day (BID) | ORAL | Status: DC | PRN

## 2018-08-03 MED ORDER — ATORVASTATIN CALCIUM 20 MG PO TABS
20.0000 mg | ORAL_TABLET | Freq: Every day | ORAL | Status: DC
  Administered 2018-08-04 – 2018-08-07 (×4): 20 mg via ORAL
  Filled 2018-08-03 (×4): qty 1

## 2018-08-03 MED ORDER — ALBUTEROL SULFATE HFA 108 (90 BASE) MCG/ACT IN AERS
6.0000 | INHALATION_SPRAY | Freq: Once | RESPIRATORY_TRACT | Status: DC
  Filled 2018-08-03: qty 6.7

## 2018-08-03 MED ORDER — MORPHINE SULFATE (PF) 2 MG/ML IV SOLN
0.5000 mg | INTRAVENOUS | Status: DC | PRN
  Administered 2018-08-08 (×2): 0.5 mg via INTRAVENOUS
  Filled 2018-08-03 (×2): qty 1

## 2018-08-03 MED ORDER — METOPROLOL SUCCINATE ER 25 MG PO TB24: 25.0000 mg | ORAL_TABLET | Freq: Every day | ORAL | Status: DC

## 2018-08-03 MED ORDER — ONDANSETRON HCL 4 MG/2ML IJ SOLN: 4.0000 mg | Freq: Three times a day (TID) | INTRAMUSCULAR | Status: DC | PRN

## 2018-08-03 MED ORDER — LOPERAMIDE HCL 2 MG PO CAPS: 2.0000 mg | ORAL_CAPSULE | Freq: Three times a day (TID) | ORAL | Status: DC | PRN

## 2018-08-03 MED ORDER — METHYLPREDNISOLONE SODIUM SUCC 125 MG IJ SOLR
60.0000 mg | Freq: Two times a day (BID) | INTRAMUSCULAR | Status: DC
  Administered 2018-08-03 – 2018-08-05 (×4): 60 mg via INTRAVENOUS
  Filled 2018-08-03 (×4): qty 2

## 2018-08-03 MED ORDER — ALPRAZOLAM 0.5 MG PO TABS
0.5000 mg | ORAL_TABLET | Freq: Every day | ORAL | Status: DC
  Administered 2018-08-03 – 2018-08-07 (×5): 0.5 mg via ORAL
  Filled 2018-08-03 (×6): qty 1

## 2018-08-03 MED ORDER — AZITHROMYCIN 250 MG PO TABS
500.0000 mg | ORAL_TABLET | Freq: Every day | ORAL | Status: AC
  Administered 2018-08-03: 500 mg via ORAL
  Filled 2018-08-03: qty 2

## 2018-08-03 MED ORDER — AEROCHAMBER Z-STAT PLUS/MEDIUM MISC
1.0000 | Freq: Once | Status: DC
  Filled 2018-08-03: qty 1

## 2018-08-03 MED ORDER — ALBUTEROL SULFATE (2.5 MG/3ML) 0.083% IN NEBU: 2.5000 mg | INHALATION_SOLUTION | RESPIRATORY_TRACT | Status: DC | PRN

## 2018-08-03 MED ORDER — SODIUM CHLORIDE 0.9 % IV BOLUS
1000.0000 mL | Freq: Once | INTRAVENOUS | Status: AC
  Administered 2018-08-03: 1000 mL via INTRAVENOUS

## 2018-08-03 MED ORDER — ACETAMINOPHEN 325 MG PO TABS
650.0000 mg | ORAL_TABLET | Freq: Four times a day (QID) | ORAL | Status: DC | PRN
  Administered 2018-08-06 – 2018-08-08 (×2): 650 mg via ORAL
  Filled 2018-08-03 (×2): qty 2

## 2018-08-03 NOTE — ED Notes (Signed)
X-ray at bedside

## 2018-08-03 NOTE — ED Notes (Signed)
ED TO INPATIENT HANDOFF REPORT  Name/Age/Gender Martin [name] 85 y.o. male  Code Status    Code Status Orders  (From admission, onward)         Start     Ordered   08/03/18 2143  Do not attempt resuscitation (DNR)  Continuous    Question Answer Comment  In the event of cardiac or respiratory ARREST Do not call a "code blue"   In the event of cardiac or respiratory ARREST Do not perform Intubation, CPR, defibrillation or ACLS   In the event of cardiac or respiratory ARREST Use medication by any route, position, wound care, and other measures to relive pain and suffering. May use oxygen, suction and manual treatment of airway obstruction as needed for comfort.      08/03/18 2144        Code Status History    Date Active Date Inactive Code Status Order ID Comments User Context   05/16/2018 1313 05/21/2018 2315 DNR [id number]  Karie Kirks, DO ED   04/20/2018 1654 04/24/2018 2032 DNR [id number]  Karmen Bongo, MD ED   03/16/2018 1207 03/19/2018 1856 Full Code [id number]  Karmen Bongo, MD ED   07/17/2015 1812 07/26/2015 1436 Full Code [id number]  Cathlyn Parsons, PA-C Inpatient   07/17/2015 1812 07/17/2015 1812 Full Code [id number]  Cathlyn Parsons, PA-C Inpatient   07/15/2015 1709 07/17/2015 1812 Full Code [id number]  Willia Craze, NP Inpatient      Home/SNF/Other Home  Chief Complaint SOB  Level of Care/Admitting Diagnosis ED Disposition    ED Disposition Condition Comment   [place]: [facility] [[id number]]  Level of Care: Telemetry [5]  Admit to tele based on following criteria: Monitor for Ischemic changes  Covid Evaluation: N/A  Diagnosis: COPD exacerbation [facility]) [[id number]]  Admitting Physician: Ivor Costa [[id number]]  Attending Physician: Ivor Costa [[id number]]  PT Class (Do Not Modify): Observation [104]  PT Acc Code (Do Not Modify): Observation [10022]       Medical History Past Medical History:  Diagnosis Date  . Arthritis   . Cancer ([place])    . CKD (chronic kidney disease) stage 3, GFR 30-59 ml/min (HCC)   . COPD (chronic obstructive pulmonary disease) ([place])   . Diabetes mellitus    denies, reports resolved with diet  . GERD (gastroesophageal reflux disease)   . H/O: GI bleed   . Hiatal hernia   . Hyperlipidemia   . Hypertension   . Myocardial infarction [facility])    1978 & 2011  . Peripheral vascular disease, unspecified ([place])   . Renal artery stenosis ([place])   . Stroke [facility])     Allergies Allergies  Allergen Reactions  . Other Anaphylaxis    Chemo drugs or ivs    IV Location/Drains/Wounds Patient Lines/Drains/Airways Status   Active Line/Drains/Airways    Name:   Placement date:   Placement time:   Site:   Days:   Peripheral IV 08/03/18 Left Antecubital   08/03/18    -    Antecubital   less than 1   Peripheral IV 08/03/18 Left Forearm   08/03/18    1852    Forearm   less than 1   External Urinary Catheter   05/18/18    2025    -   58          Labs/Imaging Results for orders placed or performed during the hospital encounter of 08/03/18 (from the past 48 hour(s))  SARS Coronavirus 2 (  CEPHEID - Performed in [place] hospital lab), Hosp Order     Status: None   Collection Time: 08/03/18  6:33 PM  Result Value Ref Range   SARS Coronavirus 2 NEGATIVE NEGATIVE    Comment: (NOTE) If result is NEGATIVE SARS-CoV-2 target nucleic acids are NOT DETECTED. The SARS-CoV-2 RNA is generally detectable in upper and lower  respiratory specimens during the acute phase of infection. The lowest  concentration of SARS-CoV-2 viral copies this assay can detect is 250  copies / mL. A negative result does not preclude SARS-CoV-2 infection  and should not be used as the sole basis for treatment or other  patient management decisions.  A negative result may occur with  improper specimen collection / handling, submission of specimen other  than nasopharyngeal swab, presence of viral mutation(s) within the  areas targeted by this  assay, and inadequate number of viral copies  (<250 copies / mL). A negative result must be combined with clinical  observations, patient history, and epidemiological information. If result is POSITIVE SARS-CoV-2 target nucleic acids are DETECTED. The SARS-CoV-2 RNA is generally detectable in upper and lower  respiratory specimens dur ing the acute phase of infection.  Positive  results are indicative of active infection with SARS-CoV-2.  Clinical  correlation with patient history and other diagnostic information is  necessary to determine patient infection status.  Positive results do  not rule out bacterial infection or co-infection with other viruses. If result is PRESUMPTIVE POSTIVE SARS-CoV-2 nucleic acids MAY BE PRESENT.   A presumptive positive result was obtained on the submitted specimen  and confirmed on repeat testing.  While 2019 novel coronavirus  (SARS-CoV-2) nucleic acids may be present in the submitted sample  additional confirmatory testing may be necessary for epidemiological  and / or clinical management purposes  to differentiate between  SARS-CoV-2 and other Sarbecovirus currently known to infect humans.  If clinically indicated additional testing with an alternate test  methodology [phone]) is advised. The SARS-CoV-2 RNA is generally  detectable in upper and lower respiratory sp ecimens during the acute  phase of infection. The expected result is Negative. Fact Sheet for Patients:  StrictlyIdeas.no Fact Sheet for Healthcare Providers: BankingDealers.co.za This test is not yet approved or cleared by the [place] FDA and has been authorized for detection and/or diagnosis of SARS-CoV-2 by FDA under an Emergency Use Authorization (EUA).  This EUA will remain in effect (meaning this test can be used) for the duration of the COVID-19 declaration under Section 564(b)(1) of the Act, 21 U.S.C. section 360bbb-3(b)(1),  unless the authorization is terminated or revoked sooner. Performed at [facility], [place] [address], [place], [place] [address]   CBC with Differential     Status: Abnormal   Collection Time: 08/03/18  6:42 PM  Result Value Ref Range   WBC 16.3 (H) 4.0 - 10.5 K/uL   RBC 3.00 (L) 4.22 - 5.81 MIL/uL   Hemoglobin 9.5 (L) 13.0 - 17.0 g/dL   HCT 31.4 (L) 39.0 - 52.0 %   MCV 104.7 (H) 80.0 - 100.0 fL   MCH 31.7 26.0 - 34.0 pg   MCHC 30.3 30.0 - 36.0 g/dL   RDW 14.4 11.5 - 15.5 %   Platelets 188 150 - 400 K/uL   nRBC 0.0 0.0 - 0.2 %   Neutrophils Relative % 94 %   Neutro Abs 15.3 (H) 1.7 - 7.7 K/uL   Lymphocytes Relative 2 %   Lymphs Abs 0.4 (L) 0.7 -  4.0 K/uL   Monocytes Relative 3 %   Monocytes Absolute 0.5 0.1 - 1.0 K/uL   Eosinophils Relative 0 %   Eosinophils Absolute 0.0 0.0 - 0.5 K/uL   Basophils Relative 0 %   Basophils Absolute 0.0 0.0 - 0.1 K/uL   Immature Granulocytes 1 %   Abs Immature Granulocytes 0.12 (H) 0.00 - 0.07 K/uL    Comment: Performed at [facility], [place] [address] [place], [place] [address]  Comprehensive metabolic panel     Status: Abnormal   Collection Time: 08/03/18  6:42 PM  Result Value Ref Range   Sodium 134 (L) 135 - 145 mmol/L   Potassium 3.7 3.5 - 5.1 mmol/L   Chloride 95 (L) 98 - 111 mmol/L   CO2 28 22 - 32 mmol/L   Glucose, Bld 494 (H) 70 - 99 mg/dL   BUN 21 8 - 23 mg/dL   Creatinine, Ser 0.98 0.61 - 1.24 mg/dL   Calcium 8.0 (L) 8.9 - 10.3 mg/dL   Total Protein 5.6 (L) 6.5 - 8.1 g/dL   Albumin 2.2 (L) 3.5 - 5.0 g/dL   AST 12 (L) 15 - 41 U/L   ALT 11 0 - 44 U/L   Alkaline Phosphatase 118 38 - 126 U/L   Total Bilirubin 0.7 0.3 - 1.2 mg/dL   GFR calc non Af Amer >60 >60 mL/min   GFR calc Af Amer >60 >60 mL/min   Anion gap 11 5 - 15    Comment: Performed at [facility], [place] [address], [place], [place] [address]  Troponin I - ONCE - STAT     Status: Abnormal   Collection Time:  08/03/18  6:42 PM  Result Value Ref Range   Troponin I 0.03 (HH) <0.03 ng/mL    Comment: CRITICAL RESULT CALLED TO, READ BACK BY AND VERIFIED WITH: S LEONARD,RN 08/03/18 1949 RHOLMES Performed at [facility], [place] [address], [place], [place] [address]   Brain natriuretic peptide     Status: Abnormal   Collection Time: 08/03/18  6:42 PM  Result Value Ref Range   B Natriuretic Peptide 259.9 (H) 0.0 - 100.0 pg/mL    Comment: Performed at [facility], [place] [address], [place], [place] [address]  Lactic acid, plasma     Status: None   Collection Time: 08/03/18  6:43 PM  Result Value Ref Range   Lactic Acid, Venous 1.7 0.5 - 1.9 mmol/L    Comment: Performed at [facility], [place] [address] [place], [place] [address]  Lactic acid, plasma     Status: None   Collection Time: 08/03/18 10:13 PM  Result Value Ref Range   Lactic Acid, Venous 1.0 0.5 - 1.9 mmol/L    Comment: Performed at [facility], [place] [address], [place],  [address]  Urinalysis, Routine w reflex microscopic     Status: Abnormal   Collection Time: 08/03/18 10:18 PM  Result Value Ref Range   Color, Urine YELLOW YELLOW   APPearance CLEAR CLEAR   Specific Gravity, Urine 1.022 1.005 - 1.030   pH 7.0 5.0 - 8.0   Glucose, UA >=500 (A) NEGATIVE mg/dL   Hgb urine dipstick NEGATIVE NEGATIVE   Bilirubin Urine NEGATIVE NEGATIVE   Ketones, ur 5 (A) NEGATIVE mg/dL   Protein, ur 30 (A) NEGATIVE mg/dL   Nitrite NEGATIVE NEGATIVE   Leukocytes,Ua NEGATIVE NEGATIVE   RBC / HPF 0-5 0 - 5 RBC/hpf  WBC, UA 0-5 0 - 5 WBC/hpf   Bacteria, UA NONE SEEN NONE SEEN    Comment: Performed at [facility], [place] [address], [place], [place] [address]  CBG monitoring, ED     Status: Abnormal   Collection Time: 08/03/18 10:33 PM  Result Value Ref Range   Glucose-Capillary 427 (H) 70 - 99 mg/dL   Dg Chest Port 1 View  Result Date:  08/03/2018 CLINICAL DATA:  Lung cancer. Shortness of breath. Discontinued chemotherapy 1 month ago. EXAM: PORTABLE CHEST 1 VIEW COMPARISON:  05/19/2018 FINDINGS: Although the right basilar airspace opacity has cleared, there has been rapid growth of the left upper thoracic pleural-based tumor, with increase lobularity and considerable increase in size from 05/19/2018. Lytic absence of portions of the fourth and fifth ribs. The mass is currently about 16.2 by 5.3 cm, previously 7.0 by 3.7 cm. Atherosclerotic calcification of the aortic arch. There is evidence of chronic rotator cuff tear on the right with prominent degenerative glenohumeral arthropathy. Nodularity at the right lung apex compatible with enlarging right apical tumor, currently 2.7 by 2.2 cm. New retrocardiac density, possibly a new left lower lobe nodule, 2.8 by 2.0 cm. IMPRESSION: 1. Prominent enlargement of the pleural-based/chest wall mass along the left upper chest with lytic destruction of the left fourth and part of the fifth rib. Rapid growth since 05/19/2018. 2. Enlarging right apical pulmonary nodule compatible with malignancy. 3. New retrocardiac nodularity measuring 2.8 by 2.0 cm, possibly malignancy or focal pneumonia. 4.  Aortic Atherosclerosis (ICD10-I70.0). Electronically Signed   By: Van Clines M.D.   On: 08/03/2018 18:52    Pending Labs Unresulted Labs (From admission, onward)    Start     Ordered   08/04/18 0500  Hemoglobin A1c  Tomorrow morning,   R     08/03/18 2214   08/04/18 0500  Lipid panel  Tomorrow morning,   R    Comments:  Please obtain as a fasting lipid panel - should not have eaten/ drank food for 8 hours prior to labs.    08/03/18 2214   08/03/18 2214  Troponin I - Now Then Q6H  Now then every 6 hours,   R     08/03/18 2214   08/03/18 2144  Culture, sputum-assessment  Once,   R     08/03/18 2144   08/03/18 2138  Influenza panel by PCR (type A & B)  Add-on,   R     08/03/18 2137   08/03/18 2138   Respiratory Panel by PCR  (Respiratory virus panel with precautions)  Add-on,   R     08/03/18 2137   08/03/18 1818  Blood culture (routine x 2)  BLOOD CULTURE X 2,   STAT     08/03/18 1817          Vitals/Pain Today's Vitals   08/03/18 2030 08/03/18 2100 08/03/18 2130 08/03/18 2200  BP: 131/70 139/69 (!) 145/70 (!) 146/64  Pulse: 95 78 94 79  Resp: 17 16 18 15   Temp:      TempSrc:      SpO2: 100% 100% 100% 99%  Weight:      PainSc:  8       Isolation Precautions Droplet precaution  Medications Medications  albuterol (VENTOLIN HFA) 108 (90 Base) MCG/ACT inhaler 6 puff (6 puffs Inhalation Refused 08/03/18 1901)  aerochamber Z-Stat Plus/medium 1 each (1 each Other Refused 08/03/18 1901)  ipratropium-albuterol (DUONEB) 0.5-2.5 (3) MG/3ML nebulizer solution 3 mL (has  no administration in time range)  albuterol (PROVENTIL) (2.5 MG/3ML) 0.083% nebulizer solution 2.5 mg (has no administration in time range)  methylPREDNISolone sodium succinate (SOLU-MEDROL) 125 mg/2 mL injection 60 mg (has no administration in time range)  dextromethorphan-guaiFENesin (MUCINEX DM) 30-600 MG per 12 hr tablet 1 tablet (has no administration in time range)  insulin glargine (LANTUS) injection 5 Units (5 Units Subcutaneous Refused 08/03/18 2237)  insulin aspart (novoLOG) injection 0-9 Units (has no administration in time range)  azithromycin (ZITHROMAX) tablet 500 mg (has no administration in time range)    Followed by  azithromycin (ZITHROMAX) tablet 250 mg (has no administration in time range)  HYDROcodone-acetaminophen (NORCO/VICODIN) 5-325 MG per tablet 1 tablet (has no administration in time range)  atorvastatin (LIPITOR) tablet 20 mg (has no administration in time range)  nitroGLYCERIN (NITROSTAT) SL tablet 0.4 mg (has no administration in time range)  ALPRAZolam (XANAX) tablet 0.5 mg (has no administration in time range)  traZODone (DESYREL) tablet 25 mg (has no administration in time range)   famotidine (PEPCID) tablet 20 mg (has no administration in time range)  loperamide (IMODIUM) capsule 2 mg (has no administration in time range)  polyethylene glycol (MIRALAX / GLYCOLAX) packet 17 g (has no administration in time range)  ondansetron (ZOFRAN) injection 4 mg (has no administration in time range)  hydrALAZINE (APRESOLINE) injection 5 mg (has no administration in time range)  acetaminophen (TYLENOL) tablet 650 mg (has no administration in time range)  morphine 2 MG/ML injection 0.5 mg (has no administration in time range)  sodium chloride 0.9 % bolus 1,000 mL (1,000 mLs Intravenous New Bag/Given (Non-Interop) 08/03/18 1900)    Mobility walks with person assist

## 2018-08-03 NOTE — Telephone Encounter (Signed)
"  Pt needs help" . PT Martin [name], from [facility]  and Parke Simmers, his 85 yo sister stated he needs help today. Sister has stayed with him the past 4 days because grandson is out of town.   [name] fell this am and slid on floor. His medicines are empty. I called [facility] home health and asked them to see pt today . April will see pt today.

## 2018-08-03 NOTE — ED Notes (Signed)
Pt refused insulin, risk and benefits discussed with pt.

## 2018-08-03 NOTE — H&P (Signed)
History and Physical    [name] LGX:[id number] DOB: [birth year] DOA: 08/03/2018  Referring MD/NP/PA:   PCP: Susy Frizzle, MD   Patient coming from:  The patient is coming from home.  At baseline, pt is paritially dependent for most of ADL.        Chief Complaint: SOB  HPI: Martin [name] is a 85 y.o. male with medical history significant of COPD, on 2 L oxygen at home, hypertension, hyperlipidemia, DM, stroke, GERD, metastasized non-small cell lung cancer, GI bleeding, CAD, atrial fibrillation not on anticoagulants, who presents with shortness of breath.  Patient states that he has been having shortness of breath in the past 3 days, which has been progressively worsening.  He has cough with streaks of dark red blood production.  Denies fever or chills.  No chest pain.  Patient uses 2 L oxygen at home, currently his saturation is 96% on 2 L oxygen in ED.  Patient denies nausea, vomiting, diarrhea, abdominal pain.  No symptoms of UTI. Pt states that he is living alone at home.  ED Course: pt was found to have WBC 16.3, troponin 0.03, lactic acid of 1.7, BNP 259.9, negative COVID-19 test, electrolytes renal function okay, temperature normal, soft blood pressure, heart rate 8290s, RR 18-21.  Patient is placed on telemetry bed for observation.  CXR showed: 1. Prominent enlargement of the pleural-based/chest wall mass along the left upper chest with lytic destruction of the left fourth and part of the fifth rib. Rapid growth since 05/19/2018. 2. Enlarging right apical pulmonary nodule compatible with malignancy. 3. New retrocardiac nodularity measuring 2.8 by 2.0 cm, possibly malignancy or focal pneumonia. 4. Aortic Atherosclerosis (ICD10-I70.0).  Review of Systems:   General: no fevers, chills, no body weight gain, has fatigue HEENT: no blurry vision, hearing changes or sore throat Respiratory: has dyspnea, coughing, and hemoptysis, no wheezing CV: no chest pain, no palpitations  GI: no nausea, vomiting, abdominal pain, diarrhea, constipation GU: no dysuria, burning on urination, increased urinary frequency, hematuria  Ext: no leg edema Neuro: no new unilateral weakness, numbness, or tingling, no vision change or hearing loss Skin: no rash, no skin tear. MSK: No muscle spasm, no deformity, no limitation of range of movement in spin Heme: No easy bruising.  Travel history: No recent long distant travel.  Allergy:  Allergies  Allergen Reactions  . Other Anaphylaxis    Chemo drugs or ivs    Past Medical History:  Diagnosis Date  . Arthritis   . Cancer ([place])   . CKD (chronic kidney disease) stage 3, GFR 30-59 ml/min (HCC)   . COPD (chronic obstructive pulmonary disease) ([place])   . Diabetes mellitus    denies, reports resolved with diet  . GERD (gastroesophageal reflux disease)   . H/O: GI bleed   . Hiatal hernia   . Hyperlipidemia   . Hypertension   . Myocardial infarction [facility])    1978 & 2011  . Peripheral vascular disease, unspecified ([place])   . Renal artery stenosis ([place])   . Stroke [facility])     Past Surgical History:  Procedure Laterality Date  . Aortobifemoral BPG  07/22/10   and Right femoral-popliteal BPG    Social History:  reports that he quit smoking about 9 years ago. His smoking use included cigarettes. He has a 65.00 pack-year smoking history. He has never used smokeless tobacco. He reports that he does not drink alcohol or use drugs.  Family History:  Family History  Problem Relation  Age of Onset  . Cancer Father 78       LIVER CANCER  . Dementia Brother 33  . CAD Brother      Prior to Admission medications   Medication Sig Start Date End Date Taking? Authorizing Provider  ALPRAZolam Duanne Moron) 0.5 MG tablet Take 1 tablet (0.5 mg total) by mouth at bedtime. 07/15/18   Susy Frizzle, MD  atorvastatin (LIPITOR) 20 MG tablet TAKE 1 TABLET BY MOUTH ONCE DAILY AT  6PM. Patient taking differently: Take 20 mg by mouth daily.  03/27/18    Susy Frizzle, MD  budesonide-formoterol (SYMBICORT) 160-4.5 MCG/ACT inhaler Inhale 2 puffs into the lungs 2 (two) times daily as needed (for COPD flares).    [provider]  famotidine (PEPCID) 20 MG tablet TAKE 1 TABLET BY MOUTH TWICE DAILY Patient not taking: No sig reported 07/27/17   Susy Frizzle, MD  HYDROcodone-acetaminophen (NORCO/VICODIN) 5-325 MG tablet Take 1 tablet by mouth every 6 (six) hours as needed for moderate pain. 05/21/18   Shelly Coss, MD  loperamide (IMODIUM) 2 MG capsule Take 1 capsule (2 mg total) by mouth 3 (three) times daily as needed for diarrhea or loose stools. 04/24/18   Rai, Ripudeep K, MD  metoprolol succinate (TOPROL-XL) 25 MG 24 hr tablet TAKE 1 TABLET BY MOUTH ONCE DAILY Patient taking differently: Take 25 mg by mouth daily.  04/12/18   Susy Frizzle, MD  NITROSTAT 0.4 MG SL tablet DISSOLVE ONE TABLET UNDER THE TONGUE AS DIRECTED Patient taking differently: Place 0.4 mg under the tongue every 5 (five) minutes as needed for chest pain. Max 3 doses 08/09/15   Susy Frizzle, MD  ondansetron (ZOFRAN ODT) 4 MG disintegrating tablet Take 1 tablet (4 mg total) by mouth every 8 (eight) hours as needed for nausea or vomiting. 04/24/18   Rai, Vernelle Emerald, MD  polyethylene glycol (MIRALAX / GLYCOLAX) packet Take 17 g by mouth daily as needed for moderate constipation or severe constipation. 04/24/18   Rai, Vernelle Emerald, MD  predniSONE (DELTASONE) 10 MG tablet Take 1 tablet (10 mg total) by mouth daily with breakfast. 07/15/18   Susy Frizzle, MD  traZODone (DESYREL) 25 mg TABS tablet Take 25 mg by mouth at bedtime.    [provider]    Physical Exam: Vitals:   08/03/18 2004 08/03/18 2030 08/03/18 2100 08/03/18 2130  BP:  131/70 139/69 (!) 145/70  Pulse: 81 95 78 94  Resp: 18 17 16 18   Temp:      TempSrc:      SpO2: 100% 100% 100% 100%  Weight:       General: Not in acute distress HEENT:       Eyes: PERRL, EOMI, no scleral icterus.        ENT: No discharge from the ears and nose, no pharynx injection, no tonsillar enlargement.        Neck: No JVD, no bruit, no mass felt. Heme: No neck lymph node enlargement. Cardiac: S1/S2, RRR, No murmurs, No gallops or rubs. Respiratory: Has scattered rhonchi on auscultation GI: Soft, nondistended, nontender, no rebound pain, no organomegaly, BS present. GU: No hematuria Ext: No pitting leg edema bilaterally. 2+DP/PT pulse bilaterally. Musculoskeletal: No joint deformities, No joint redness or warmth, no limitation of ROM in spin. Skin: No rashes.  Neuro: Alert, oriented X3, cranial nerves II-XII grossly intact, moves all extremities normally Psych: Patient is not psychotic, no suicidal or hemocidal ideation.  Labs on Admission: I have  personally reviewed following labs and imaging studies  CBC: Recent Labs  Lab 08/03/18 1842  WBC 16.3*  NEUTROABS 15.3*  HGB 9.5*  HCT 31.4*  MCV 104.7*  PLT 947   Basic Metabolic Panel: Recent Labs  Lab 08/03/18 1842  NA 134*  K 3.7  CL 95*  CO2 28  GLUCOSE 494*  BUN 21  CREATININE 0.98  CALCIUM 8.0*   GFR: Estimated Creatinine Clearance: 49.5 mL/min (by C-G formula based on SCr of 0.98 mg/dL). Liver Function Tests: Recent Labs  Lab 08/03/18 1842  AST 12*  ALT 11  ALKPHOS 118  BILITOT 0.7  PROT 5.6*  ALBUMIN 2.2*   No results for input(s): LIPASE, AMYLASE in the last 168 hours. No results for input(s): AMMONIA in the last 168 hours. Coagulation Profile: No results for input(s): INR, PROTIME in the last 168 hours. Cardiac Enzymes: Recent Labs  Lab 08/03/18 1842  TROPONINI 0.03*   BNP (last 3 results) No results for input(s): PROBNP in the last 8760 hours. HbA1C: No results for input(s): HGBA1C in the last 72 hours. CBG: No results for input(s): GLUCAP in the last 168 hours. Lipid Profile: No results for input(s): CHOL, HDL, LDLCALC, TRIG, CHOLHDL, LDLDIRECT in the last 72 hours. Thyroid Function Tests: No  results for input(s): TSH, T4TOTAL, FREET4, T3FREE, THYROIDAB in the last 72 hours. Anemia Panel: No results for input(s): VITAMINB12, FOLATE, FERRITIN, TIBC, IRON, RETICCTPCT in the last 72 hours. Urine analysis:    Component Value Date/Time   COLORURINE YELLOW 05/16/2018 1049   APPEARANCEUR CLEAR 05/16/2018 1049   LABSPEC 1.014 05/16/2018 1049   PHURINE 5.0 05/16/2018 1049   GLUCOSEU >=500 (A) 05/16/2018 1049   HGBUR NEGATIVE 05/16/2018 1049   BILIRUBINUR NEGATIVE 05/16/2018 1049   KETONESUR NEGATIVE 05/16/2018 1049   PROTEINUR 30 (A) 05/16/2018 1049   UROBILINOGEN 0.2 07/16/2010 1429   NITRITE NEGATIVE 05/16/2018 1049   LEUKOCYTESUR NEGATIVE 05/16/2018 1049   Sepsis Labs: @LABRCNTIP (procalcitonin:4,lacticidven:4) ) Recent Results (from the past 240 hour(s))  SARS Coronavirus 2 (CEPHEID - Performed in [place] hospital lab), Hosp Order     Status: None   Collection Time: 08/03/18  6:33 PM  Result Value Ref Range Status   SARS Coronavirus 2 NEGATIVE NEGATIVE Final    Comment: (NOTE) If result is NEGATIVE SARS-CoV-2 target nucleic acids are NOT DETECTED. The SARS-CoV-2 RNA is generally detectable in upper and lower  respiratory specimens during the acute phase of infection. The lowest  concentration of SARS-CoV-2 viral copies this assay can detect is 250  copies / mL. A negative result does not preclude SARS-CoV-2 infection  and should not be used as the sole basis for treatment or other  patient management decisions.  A negative result may occur with  improper specimen collection / handling, submission of specimen other  than nasopharyngeal swab, presence of viral mutation(s) within the  areas targeted by this assay, and inadequate number of viral copies  (<250 copies / mL). A negative result must be combined with clinical  observations, patient history, and epidemiological information. If result is POSITIVE SARS-CoV-2 target nucleic acids are DETECTED. The SARS-CoV-2  RNA is generally detectable in upper and lower  respiratory specimens dur ing the acute phase of infection.  Positive  results are indicative of active infection with SARS-CoV-2.  Clinical  correlation with patient history and other diagnostic information is  necessary to determine patient infection status.  Positive results do  not rule out bacterial infection or co-infection with other viruses.  If result is PRESUMPTIVE POSTIVE SARS-CoV-2 nucleic acids MAY BE PRESENT.   A presumptive positive result was obtained on the submitted specimen  and confirmed on repeat testing.  While 2019 novel coronavirus  (SARS-CoV-2) nucleic acids may be present in the submitted sample  additional confirmatory testing may be necessary for epidemiological  and / or clinical management purposes  to differentiate between  SARS-CoV-2 and other Sarbecovirus currently known to infect humans.  If clinically indicated additional testing with an alternate test  methodology [phone]) is advised. The SARS-CoV-2 RNA is generally  detectable in upper and lower respiratory sp ecimens during the acute  phase of infection. The expected result is Negative. Fact Sheet for Patients:  StrictlyIdeas.no Fact Sheet for Healthcare Providers: BankingDealers.co.za This test is not yet approved or cleared by the [place] FDA and has been authorized for detection and/or diagnosis of SARS-CoV-2 by FDA under an Emergency Use Authorization (EUA).  This EUA will remain in effect (meaning this test can be used) for the duration of the COVID-19 declaration under Section 564(b)(1) of the Act, 21 U.S.C. section 360bbb-3(b)(1), unless the authorization is terminated or revoked sooner. Performed at [facility], [place] [address], [place], [place] [address]      Radiological Exams on Admission: Dg Chest Port 1 View  Result Date: 08/03/2018 CLINICAL DATA:  Lung cancer.  Shortness of breath. Discontinued chemotherapy 1 month ago. EXAM: PORTABLE CHEST 1 VIEW COMPARISON:  05/19/2018 FINDINGS: Although the right basilar airspace opacity has cleared, there has been rapid growth of the left upper thoracic pleural-based tumor, with increase lobularity and considerable increase in size from 05/19/2018. Lytic absence of portions of the fourth and fifth ribs. The mass is currently about 16.2 by 5.3 cm, previously 7.0 by 3.7 cm. Atherosclerotic calcification of the aortic arch. There is evidence of chronic rotator cuff tear on the right with prominent degenerative glenohumeral arthropathy. Nodularity at the right lung apex compatible with enlarging right apical tumor, currently 2.7 by 2.2 cm. New retrocardiac density, possibly a new left lower lobe nodule, 2.8 by 2.0 cm. IMPRESSION: 1. Prominent enlargement of the pleural-based/chest wall mass along the left upper chest with lytic destruction of the left fourth and part of the fifth rib. Rapid growth since 05/19/2018. 2. Enlarging right apical pulmonary nodule compatible with malignancy. 3. New retrocardiac nodularity measuring 2.8 by 2.0 cm, possibly malignancy or focal pneumonia. 4.  Aortic Atherosclerosis (ICD10-I70.0). Electronically Signed   By: Van Clines M.D.   On: 08/03/2018 18:52     EKG: Independently reviewed.  Sinus rhythm, QTC 443, mild ST depression in inferior leads, V5-V6   Assessment/Plan Principal Problem:   COPD exacerbation (HCC) Active Problems:   HLD (hyperlipidemia)   Diabetes mellitus, type 2 (HCC)   Stroke [facility])   Essential hypertension   Anxiety state   Metastatic lung cancer (metastasis from lung to other site) (HCC)   Elevated troponin   Macrocytic anemia   Hemoptysis   GERD (gastroesophageal reflux disease)   CAD (coronary artery disease)   COPD exacerbation and chronic respiratory failure with hypoxia: Patient is on 2 L oxygen at home, now his saturation is 96% on 2 L oxygen, no  new oxygen requirement.  Chest x-ray showed retrocardiac nodularity measuring 2.8 by 2.0 cm--> possibly malignancy or focal pneumonia. Patient has leukocytosis with WBC 16.3, but no fever.Clinically, pt does not seem to have pneumonia. Not septic.  Patient has rhonchi on auscultation, indicating possible COPD exacerbation.  Patient reports hemoptysis,  making PE potential differential diagnosis. Since pt does not have chest pain or new oxygen requirement, I have low suspicions for PE.  Will treat patient has COPD exacerbation watch patient closely, if no improvement will do PE work-up.  -will place on tele bed for obs -Nebulizers: scheduled Duoneb and prn albuterol -Solu-Medrol 60 mg IV bid -Z pak -Mucinex for cough  -Incentive spirometry -Follow up blood culture x2, sputum culture, respiratory virus panel, Flu pcr -Nasal cannula oxygen as needed to maintain O2 saturation 93% or greater -will consult to palliative care, social work, case manager  Metastatic lung cancer (metastasis from lung to other site): pt is followed up by Dr. Julien Nordmann. Pt has "stage IV (T2b, N0, M1c) non-small cell carcinoma presented with right upper lobe lung mass in addition to metastatic disease to the left fourth rib as well as right sacral area and pleural-based nodularity". "He had Palliative radiotherapy to the right sacral metastatic bone lesion under the care of Dr. Tammi Klippel". Pt was chemotherapy which stopped by pt a month ago.  -palliative consult  HLD (hyperlipidemia): -Lipitor  Diet controlled Diabetes mellitus, type 2 without complications: Last V7K 6.7 on 03/30/18, well controled. Patient is not taking meds at home. Blood sugar 494 with normal AG. -startLantus 5 U daily -SSI  Hx of Stroke [facility]): -hold plavix due to hemoptysis -Continue Lipitor  Essential hypertension: -IV hydralazine as needed  Anxiety state: -continue home Xanax  Hx of CAD and Elevated troponin: trop 0.03.  Patient does not have  chest pain,but reports some chest discomfort across her chest.  Possibly due to demand ischemia. - cycle CE q6 x3 and repeat EKG in the am  - prn Nitroglycerin, Morphine, and lipitor  - Risk factor stratification: will check FLP and A1C  Macrocytic anemia: hgb stable. 9.5 on 05/19/18-->9.5 today -f/u by CBC  Hemoptysis: possibly due to lung cancer. -hold hemoptysis  GERD (gastroesophageal reflux disease): -Pepcid     DVT ppx: SCD Code Status: DNR (I discussed with patient and explained the meaning of CODE STATUS. Patient wants to be DNR) Family Communication: None at bed side.     Disposition Plan: to be determined Consults called:  none Admission status: Obs / tele     Date of Service 08/03/2018    [place] [facility]   If 7PM-7AM, please contact night-coverage [email or web address] Password [facility] 08/03/2018, 10:19 PM

## 2018-08-03 NOTE — ED Triage Notes (Signed)
Pt BIBA from home. Pt has hx of lung CA and COPD. Pt states that he has had SHOB x several weeks, worsening today. Rales and rhonchi with EMS. Pt reports pink tinged sputum. Pt always wears 2L at home, increased to 4L with EMS. Pt voluntarily stopped chemo approximately 1 month ago. 96% on 2L 100% on 4L NSR

## 2018-08-03 NOTE — ED Provider Notes (Signed)
[place] DEPT Provider Note   CSN: [id number] Arrival date & time: 08/03/18  1757    History   Chief Complaint Chief Complaint  Patient presents with   Shortness of Breath    HPI LIPA Martin [name] is a 85 y.o. male.     85 yo M with a chief complaints of shortness of breath on exertion.  Going on for about 4 months now.  Patient has had a chronic cough that is unchanged.  Now is only able to make it a few steps without becoming completely exhausted.  Has a history of lung cancer and he unfortunately was unable to tolerate chemotherapy and so is no longer going to have that performed.  He states that today he was unable to even make himself breakfast and decided it was time to come in to the hospital.  The history is provided by the patient.  Shortness of Breath  Severity:  Severe Onset quality:  Gradual Duration:  4 months Timing:  Constant Progression:  Worsening Chronicity:  New Relieved by:  Nothing Worsened by:  Nothing Ineffective treatments:  None tried Associated symptoms: cough   Associated symptoms: no abdominal pain, no chest pain, no fever, no headaches, no rash and no vomiting     Past Medical History:  Diagnosis Date   Arthritis    Cancer ([place])    CKD (chronic kidney disease) stage 3, GFR 30-59 ml/min (HCC)    COPD (chronic obstructive pulmonary disease) ([place])    Diabetes mellitus    denies, reports resolved with diet   GERD (gastroesophageal reflux disease)    H/O: GI bleed    Hiatal hernia    Hyperlipidemia    Hypertension    Myocardial infarction [facility])    1978 & 2011   Peripheral vascular disease, unspecified ([place])    Renal artery stenosis ([place])    Stroke [facility])     Patient Active Problem List   Diagnosis Date Noted   Elevated troponin 08/03/2018   Macrocytic anemia 08/03/2018   Hemoptysis 08/03/2018   GERD (gastroesophageal reflux disease) 08/03/2018   CAD (coronary artery disease)  08/03/2018   Palliative care by specialist    Generalized weakness    Right lower lobe pneumonia ([place]) 05/16/2018   Dehydration    Diarrhea 04/20/2018   Neutropenia ([place]) 04/20/2018   Goals of care, counseling/discussion 04/06/2018   Encounter for antineoplastic chemotherapy 04/06/2018   Encounter for antineoplastic immunotherapy 04/06/2018   Bone metastasis ([place]) 03/18/2018   Metastatic lung cancer (metastasis from lung to other site) ([place]) 03/16/2018   Insomnia 10/05/2015   Late effect of stroke 10/05/2015   Slow transit constipation    Nausea without vomiting    Essential hypertension    Anxiety state    Cerebellar infarct ([place]) 07/17/2015   Cerebellar stroke ([place])    Coronary artery disease involving native coronary artery of native heart without angina pectoris    COPD exacerbation (HCC)    CKD (chronic kidney disease) stage 3, GFR 30-59 ml/min (HCC)    Leukocytosis    Thrombocytopenia (HCC)    Cerebrovascular accident (CVA) ([place])    Stroke ([place]) 07/15/2015   CVA (cerebral infarction) 07/15/2015   Hypothermia 07/15/2015   Atherosclerotic PVD with intermittent claudication ([place]) 12/29/2013   Aftercare following surgery of the circulatory system, NEC 12/23/2012   Atherosclerosis of native arteries of the extremities with intermittent claudication 06/17/2012   Diabetes mellitus, type 2 ([place]) 06/11/2012   Peripheral vascular disease, unspecified ([place]) 05/29/2011  HLD (hyperlipidemia) 08/14/2009   MYOCARDIAL INFARCTION 08/14/2009   Atrial fibrillation ([place]) 08/14/2009   TOBACCO ABUSE, HX OF 08/14/2009    Past Surgical History:  Procedure Laterality Date   Aortobifemoral BPG  07/22/10   and Right femoral-popliteal BPG        Home Medications    Prior to Admission medications   Medication Sig Start Date End Date Taking? Authorizing Provider  ALPRAZolam Duanne Moron) 0.5 MG tablet Take 1 tablet (0.5 mg total) by mouth at bedtime.  07/15/18  Yes Susy Frizzle, MD  atorvastatin (LIPITOR) 20 MG tablet TAKE 1 TABLET BY MOUTH ONCE DAILY AT  6PM. Patient taking differently: Take 20 mg by mouth daily.  03/27/18  Yes Susy Frizzle, MD  budesonide-formoterol [facility]) 160-4.5 MCG/ACT inhaler Inhale 2 puffs into the lungs 2 (two) times daily as needed (for COPD flares).   Yes [provider]  clopidogrel (PLAVIX) 75 MG tablet Take 75 mg by mouth daily. 06/10/18  Yes [provider]  HYDROcodone-acetaminophen (NORCO/VICODIN) 5-325 MG tablet Take 1 tablet by mouth every 6 (six) hours as needed for moderate pain. 05/21/18  Yes Shelly Coss, MD  loperamide (IMODIUM) 2 MG capsule Take 1 capsule (2 mg total) by mouth 3 (three) times daily as needed for diarrhea or loose stools. 04/24/18  Yes Rai, Ripudeep K, MD  ondansetron (ZOFRAN ODT) 4 MG disintegrating tablet Take 1 tablet (4 mg total) by mouth every 8 (eight) hours as needed for nausea or vomiting. 04/24/18  Yes Rai, Ripudeep K, MD  polyethylene glycol (MIRALAX / GLYCOLAX) packet Take 17 g by mouth daily as needed for moderate constipation or severe constipation. 04/24/18  Yes Rai, Ripudeep K, MD  predniSONE (DELTASONE) 10 MG tablet Take 1 tablet (10 mg total) by mouth daily with breakfast. 07/15/18  Yes Susy Frizzle, MD  traZODone (DESYREL) 25 mg TABS tablet Take 25 mg by mouth at bedtime.   Yes [provider]  famotidine (PEPCID) 20 MG tablet TAKE 1 TABLET BY MOUTH TWICE DAILY Patient not taking: Reported on 08/03/2018 07/27/17   Susy Frizzle, MD  metoprolol succinate (TOPROL-XL) 25 MG 24 hr tablet TAKE 1 TABLET BY MOUTH ONCE DAILY Patient not taking: No sig reported 04/12/18   Susy Frizzle, MD  NITROSTAT 0.4 MG SL tablet DISSOLVE ONE TABLET UNDER THE TONGUE AS DIRECTED Patient taking differently: Place 0.4 mg under the tongue every 5 (five) minutes as needed for chest pain. Max 3 doses 08/09/15   Susy Frizzle, MD    Family History Family  History  Problem Relation Age of Onset   Cancer Father 84       LIVER CANCER   Dementia Brother 68   CAD Brother     Social History Social History   Tobacco Use   Smoking status: Former Smoker    Packs/day: 1.00    Years: 65.00    Pack years: 65.00    Types: Cigarettes    Last attempt to quit: 07/08/2009    Years since quitting: 9.0   Smokeless tobacco: Never Used  Substance Use Topics   Alcohol use: No   Drug use: No     Allergies   Other   Review of Systems Review of Systems  Constitutional: Negative for chills and fever.  HENT: Negative for congestion and facial swelling.   Eyes: Negative for discharge and visual disturbance.  Respiratory: Positive for cough and shortness of breath.   Cardiovascular: Negative for chest pain and palpitations.  Gastrointestinal: Negative for abdominal pain, diarrhea and vomiting.  Musculoskeletal: Negative for arthralgias and myalgias.  Skin: Negative for color change and rash.  Neurological: Negative for tremors, syncope and headaches.  Psychiatric/Behavioral: Negative for confusion and dysphoric mood.     Physical Exam Updated Vital Signs BP (!) 146/64    Pulse 79    Temp 98 F (36.7 C) (Oral)    Resp 15    Wt 63.5 kg    SpO2 99%    BMI 22.60 kg/m   Physical Exam Vitals signs and nursing note reviewed.  Constitutional:      Appearance: He is well-developed.  HENT:     Head: Normocephalic and atraumatic.  Eyes:     Pupils: Pupils are equal, round, and reactive to light.  Neck:     Musculoskeletal: Normal range of motion and neck supple.     Vascular: No JVD.  Cardiovascular:     Rate and Rhythm: Normal rate and regular rhythm.     Heart sounds: No murmur. No friction rub. No gallop.   Pulmonary:     Effort: No respiratory distress.     Breath sounds: Decreased breath sounds (in all fields) present. No wheezing.  Abdominal:     General: There is no distension.     Tenderness: There is no guarding or rebound.   Musculoskeletal: Normal range of motion.  Skin:    Coloration: Skin is not pale.     Findings: No rash.  Neurological:     Mental Status: He is alert and oriented to person, place, and time.  Psychiatric:        Behavior: Behavior normal.      ED Treatments / Results  Labs (all labs ordered are listed, but only abnormal results are displayed) Labs Reviewed  CBC WITH DIFFERENTIAL/PLATELET - Abnormal; Notable for the following components:      Result Value   WBC 16.3 (*)    RBC 3.00 (*)    Hemoglobin 9.5 (*)    HCT 31.4 (*)    MCV 104.7 (*)    Neutro Abs 15.3 (*)    Lymphs Abs 0.4 (*)    Abs Immature Granulocytes 0.12 (*)    All other components within normal limits  COMPREHENSIVE METABOLIC PANEL - Abnormal; Notable for the following components:   Sodium 134 (*)    Chloride 95 (*)    Glucose, Bld 494 (*)    Calcium 8.0 (*)    Total Protein 5.6 (*)    Albumin 2.2 (*)    AST 12 (*)    All other components within normal limits  TROPONIN I - Abnormal; Notable for the following components:   Troponin I 0.03 (*)    All other components within normal limits  BRAIN NATRIURETIC PEPTIDE - Abnormal; Notable for the following components:   B Natriuretic Peptide 259.9 (*)    All other components within normal limits  URINALYSIS, ROUTINE W REFLEX MICROSCOPIC - Abnormal; Notable for the following components:   Glucose, UA >=500 (*)    Ketones, ur 5 (*)    Protein, ur 30 (*)    All other components within normal limits  CBG MONITORING, ED - Abnormal; Notable for the following components:   Glucose-Capillary 427 (*)    All other components within normal limits  SARS CORONAVIRUS 2 (HOSPITAL ORDER, [facility])  CULTURE, BLOOD (ROUTINE X 2)  CULTURE, BLOOD (ROUTINE X 2)  RESPIRATORY PANEL BY PCR  EXPECTORATED SPUTUM ASSESSMENT  W REFEX TO RESP CULTURE  LACTIC ACID, PLASMA  LACTIC ACID, PLASMA  INFLUENZA PANEL BY PCR (TYPE A & B)  HEMOGLOBIN A1C  LIPID  PANEL  TROPONIN I  TROPONIN I  TROPONIN I    EKG EKG Interpretation  Date/Time:  Tuesday Aug 03 2018 18:39:16 EDT Ventricular Rate:  86 PR Interval:    QRS Duration: 104 QT Interval:  370 QTC Calculation: 443 R Axis:   76 Text Interpretation:  Sinus rhythm Borderline repolarization abnormality No significant change since last tracing Confirmed by Deno Etienne [phone]) on 08/03/2018 6:42:55 PM   Radiology Dg Chest Port 1 View  Result Date: 08/03/2018 CLINICAL DATA:  Lung cancer. Shortness of breath. Discontinued chemotherapy 1 month ago. EXAM: PORTABLE CHEST 1 VIEW COMPARISON:  05/19/2018 FINDINGS: Although the right basilar airspace opacity has cleared, there has been rapid growth of the left upper thoracic pleural-based tumor, with increase lobularity and considerable increase in size from 05/19/2018. Lytic absence of portions of the fourth and fifth ribs. The mass is currently about 16.2 by 5.3 cm, previously 7.0 by 3.7 cm. Atherosclerotic calcification of the aortic arch. There is evidence of chronic rotator cuff tear on the right with prominent degenerative glenohumeral arthropathy. Nodularity at the right lung apex compatible with enlarging right apical tumor, currently 2.7 by 2.2 cm. New retrocardiac density, possibly a new left lower lobe nodule, 2.8 by 2.0 cm. IMPRESSION: 1. Prominent enlargement of the pleural-based/chest wall mass along the left upper chest with lytic destruction of the left fourth and part of the fifth rib. Rapid growth since 05/19/2018. 2. Enlarging right apical pulmonary nodule compatible with malignancy. 3. New retrocardiac nodularity measuring 2.8 by 2.0 cm, possibly malignancy or focal pneumonia. 4.  Aortic Atherosclerosis (ICD10-I70.0). Electronically Signed   By: Van Clines M.D.   On: 08/03/2018 18:52    Procedures Procedures (including critical care time)  Medications Ordered in ED Medications  albuterol (VENTOLIN HFA) 108 (90 Base) MCG/ACT  inhaler 6 puff (6 puffs Inhalation Refused 08/03/18 1901)  aerochamber Z-Stat Plus/medium 1 each (1 each Other Refused 08/03/18 1901)  ipratropium-albuterol (DUONEB) 0.5-2.5 (3) MG/3ML nebulizer solution 3 mL (has no administration in time range)  albuterol (PROVENTIL) (2.5 MG/3ML) 0.083% nebulizer solution 2.5 mg (has no administration in time range)  methylPREDNISolone sodium succinate (SOLU-MEDROL) 125 mg/2 mL injection 60 mg (has no administration in time range)  dextromethorphan-guaiFENesin (MUCINEX DM) 30-600 MG per 12 hr tablet 1 tablet (has no administration in time range)  insulin glargine (LANTUS) injection 5 Units (5 Units Subcutaneous Refused 08/03/18 2237)  insulin aspart (novoLOG) injection 0-9 Units (has no administration in time range)  azithromycin (ZITHROMAX) tablet 500 mg (has no administration in time range)    Followed by  azithromycin (ZITHROMAX) tablet 250 mg (has no administration in time range)  HYDROcodone-acetaminophen (NORCO/VICODIN) 5-325 MG per tablet 1 tablet (has no administration in time range)  atorvastatin (LIPITOR) tablet 20 mg (has no administration in time range)  nitroGLYCERIN (NITROSTAT) SL tablet 0.4 mg (has no administration in time range)  ALPRAZolam (XANAX) tablet 0.5 mg (has no administration in time range)  traZODone (DESYREL) tablet 25 mg (has no administration in time range)  famotidine (PEPCID) tablet 20 mg (has no administration in time range)  loperamide (IMODIUM) capsule 2 mg (has no administration in time range)  polyethylene glycol (MIRALAX / GLYCOLAX) packet 17 g (has no administration in time range)  ondansetron (ZOFRAN) injection 4 mg (has no administration in time range)  hydrALAZINE (APRESOLINE) injection  5 mg (has no administration in time range)  acetaminophen (TYLENOL) tablet 650 mg (has no administration in time range)  morphine 2 MG/ML injection 0.5 mg (has no administration in time range)  sodium chloride 0.9 % bolus 1,000 mL (0 mLs  Intravenous Stopped 08/03/18 2200)     Initial Impression / Assessment and Plan / ED Course  I have reviewed the triage vital signs and the nursing notes.  Pertinent labs & imaging results that were available during my care of the patient were reviewed by me and considered in my medical decision making (see chart for details).        85 yo M with a chief complaint of shortness of breath.  Going on for about 4 months.  Diminished lung sounds in all fields for me.  Will obtain lab work chest x-ray reassess.  Patient is chronically on 2 L at all times he is unable to perform ADLs now.  Likely need admission.  Blood pressures are soft on arrival.  We will give a bolus of IV fluids.  Patient with rapid worsening of his lung ca on cxr.  With severe fatigue at home will discuss with hospitalist for admission.   The patients results and plan were reviewed and discussed.   Any x-rays performed were independently reviewed by myself.   Differential diagnosis were considered with the presenting HPI.  Medications  albuterol (VENTOLIN HFA) 108 (90 Base) MCG/ACT inhaler 6 puff (6 puffs Inhalation Refused 08/03/18 1901)  aerochamber Z-Stat Plus/medium 1 each (1 each Other Refused 08/03/18 1901)  ipratropium-albuterol (DUONEB) 0.5-2.5 (3) MG/3ML nebulizer solution 3 mL (has no administration in time range)  albuterol (PROVENTIL) (2.5 MG/3ML) 0.083% nebulizer solution 2.5 mg (has no administration in time range)  methylPREDNISolone sodium succinate (SOLU-MEDROL) 125 mg/2 mL injection 60 mg (has no administration in time range)  dextromethorphan-guaiFENesin (MUCINEX DM) 30-600 MG per 12 hr tablet 1 tablet (has no administration in time range)  insulin glargine (LANTUS) injection 5 Units (5 Units Subcutaneous Refused 08/03/18 2237)  insulin aspart (novoLOG) injection 0-9 Units (has no administration in time range)  azithromycin (ZITHROMAX) tablet 500 mg (has no administration in time range)    Followed by    azithromycin (ZITHROMAX) tablet 250 mg (has no administration in time range)  HYDROcodone-acetaminophen (NORCO/VICODIN) 5-325 MG per tablet 1 tablet (has no administration in time range)  atorvastatin (LIPITOR) tablet 20 mg (has no administration in time range)  nitroGLYCERIN (NITROSTAT) SL tablet 0.4 mg (has no administration in time range)  ALPRAZolam (XANAX) tablet 0.5 mg (has no administration in time range)  traZODone (DESYREL) tablet 25 mg (has no administration in time range)  famotidine (PEPCID) tablet 20 mg (has no administration in time range)  loperamide (IMODIUM) capsule 2 mg (has no administration in time range)  polyethylene glycol (MIRALAX / GLYCOLAX) packet 17 g (has no administration in time range)  ondansetron (ZOFRAN) injection 4 mg (has no administration in time range)  hydrALAZINE (APRESOLINE) injection 5 mg (has no administration in time range)  acetaminophen (TYLENOL) tablet 650 mg (has no administration in time range)  morphine 2 MG/ML injection 0.5 mg (has no administration in time range)  sodium chloride 0.9 % bolus 1,000 mL (0 mLs Intravenous Stopped 08/03/18 2200)    Vitals:   08/03/18 2030 08/03/18 2100 08/03/18 2130 08/03/18 2200  BP: 131/70 139/69 (!) 145/70 (!) 146/64  Pulse: 95 78 94 79  Resp: 17 16 18 15   Temp:      TempSrc:  SpO2: 100% 100% 100% 99%  Weight:        Final diagnoses:  Acute respiratory failure with hypoxia ([place])    Admission/ observation were discussed with the admitting physician, patient and/or family and they are comfortable with the plan.    Final Clinical Impressions(s) / ED Diagnoses   Final diagnoses:  Acute respiratory failure with hypoxia [facility])    ED Discharge Orders    None       Deno Etienne, DO 08/03/18 2258

## 2018-08-03 NOTE — ED Notes (Signed)
Pt unable to ambulate, unstable on his feet.  Pt had to be held while standing to urinate.

## 2018-08-03 NOTE — ED Notes (Signed)
Date and time results received: 08/03/18 1950 (use smartphrase ".now" to insert current time)  Test: troponin Critical Value: 0.03  Name of Provider Notified: Deno Etienne  Orders Received? Or Actions Taken?: monitor

## 2018-08-04 DIAGNOSIS — F419 Anxiety disorder, unspecified: Secondary | ICD-10-CM | POA: Diagnosis not present

## 2018-08-04 DIAGNOSIS — I700 Atherosclerosis of aorta: Secondary | ICD-10-CM | POA: Diagnosis present

## 2018-08-04 DIAGNOSIS — J189 Pneumonia, unspecified organism: Secondary | ICD-10-CM | POA: Diagnosis present

## 2018-08-04 DIAGNOSIS — Z66 Do not resuscitate: Secondary | ICD-10-CM | POA: Diagnosis present

## 2018-08-04 DIAGNOSIS — R52 Pain, unspecified: Secondary | ICD-10-CM | POA: Diagnosis not present

## 2018-08-04 DIAGNOSIS — R627 Adult failure to thrive: Secondary | ICD-10-CM | POA: Diagnosis present

## 2018-08-04 DIAGNOSIS — D631 Anemia in chronic kidney disease: Secondary | ICD-10-CM | POA: Diagnosis present

## 2018-08-04 DIAGNOSIS — C3411 Malignant neoplasm of upper lobe, right bronchus or lung: Secondary | ICD-10-CM | POA: Diagnosis present

## 2018-08-04 DIAGNOSIS — R042 Hemoptysis: Secondary | ICD-10-CM | POA: Diagnosis present

## 2018-08-04 DIAGNOSIS — D539 Nutritional anemia, unspecified: Secondary | ICD-10-CM | POA: Diagnosis not present

## 2018-08-04 DIAGNOSIS — J441 Chronic obstructive pulmonary disease with (acute) exacerbation: Secondary | ICD-10-CM | POA: Diagnosis present

## 2018-08-04 DIAGNOSIS — I2510 Atherosclerotic heart disease of native coronary artery without angina pectoris: Secondary | ICD-10-CM | POA: Diagnosis present

## 2018-08-04 DIAGNOSIS — Y95 Nosocomial condition: Secondary | ICD-10-CM | POA: Diagnosis present

## 2018-08-04 DIAGNOSIS — E1165 Type 2 diabetes mellitus with hyperglycemia: Secondary | ICD-10-CM | POA: Diagnosis present

## 2018-08-04 DIAGNOSIS — C3412 Malignant neoplasm of upper lobe, left bronchus or lung: Secondary | ICD-10-CM | POA: Diagnosis not present

## 2018-08-04 DIAGNOSIS — Z743 Need for continuous supervision: Secondary | ICD-10-CM | POA: Diagnosis not present

## 2018-08-04 DIAGNOSIS — I10 Essential (primary) hypertension: Secondary | ICD-10-CM | POA: Diagnosis not present

## 2018-08-04 DIAGNOSIS — F411 Generalized anxiety disorder: Secondary | ICD-10-CM | POA: Diagnosis not present

## 2018-08-04 DIAGNOSIS — I639 Cerebral infarction, unspecified: Secondary | ICD-10-CM

## 2018-08-04 DIAGNOSIS — I701 Atherosclerosis of renal artery: Secondary | ICD-10-CM | POA: Diagnosis present

## 2018-08-04 DIAGNOSIS — K219 Gastro-esophageal reflux disease without esophagitis: Secondary | ICD-10-CM | POA: Diagnosis not present

## 2018-08-04 DIAGNOSIS — Z7189 Other specified counseling: Secondary | ICD-10-CM

## 2018-08-04 DIAGNOSIS — E1122 Type 2 diabetes mellitus with diabetic chronic kidney disease: Secondary | ICD-10-CM | POA: Diagnosis present

## 2018-08-04 DIAGNOSIS — E119 Type 2 diabetes mellitus without complications: Secondary | ICD-10-CM | POA: Diagnosis not present

## 2018-08-04 DIAGNOSIS — E1151 Type 2 diabetes mellitus with diabetic peripheral angiopathy without gangrene: Secondary | ICD-10-CM | POA: Diagnosis present

## 2018-08-04 DIAGNOSIS — C3490 Malignant neoplasm of unspecified part of unspecified bronchus or lung: Secondary | ICD-10-CM | POA: Diagnosis not present

## 2018-08-04 DIAGNOSIS — Z20828 Contact with and (suspected) exposure to other viral communicable diseases: Secondary | ICD-10-CM | POA: Diagnosis present

## 2018-08-04 DIAGNOSIS — I129 Hypertensive chronic kidney disease with stage 1 through stage 4 chronic kidney disease, or unspecified chronic kidney disease: Secondary | ICD-10-CM | POA: Diagnosis present

## 2018-08-04 DIAGNOSIS — I252 Old myocardial infarction: Secondary | ICD-10-CM | POA: Diagnosis not present

## 2018-08-04 DIAGNOSIS — I69318 Other symptoms and signs involving cognitive functions following cerebral infarction: Secondary | ICD-10-CM | POA: Diagnosis not present

## 2018-08-04 DIAGNOSIS — E1159 Type 2 diabetes mellitus with other circulatory complications: Secondary | ICD-10-CM | POA: Diagnosis not present

## 2018-08-04 DIAGNOSIS — J449 Chronic obstructive pulmonary disease, unspecified: Secondary | ICD-10-CM | POA: Diagnosis present

## 2018-08-04 DIAGNOSIS — M2550 Pain in unspecified joint: Secondary | ICD-10-CM | POA: Diagnosis not present

## 2018-08-04 DIAGNOSIS — J9621 Acute and chronic respiratory failure with hypoxia: Secondary | ICD-10-CM | POA: Diagnosis present

## 2018-08-04 DIAGNOSIS — R7989 Other specified abnormal findings of blood chemistry: Secondary | ICD-10-CM | POA: Diagnosis not present

## 2018-08-04 DIAGNOSIS — N183 Chronic kidney disease, stage 3 (moderate): Secondary | ICD-10-CM | POA: Diagnosis present

## 2018-08-04 DIAGNOSIS — I4891 Unspecified atrial fibrillation: Secondary | ICD-10-CM | POA: Diagnosis present

## 2018-08-04 DIAGNOSIS — J440 Chronic obstructive pulmonary disease with acute lower respiratory infection: Secondary | ICD-10-CM | POA: Diagnosis present

## 2018-08-04 DIAGNOSIS — C7951 Secondary malignant neoplasm of bone: Secondary | ICD-10-CM | POA: Diagnosis present

## 2018-08-04 DIAGNOSIS — C782 Secondary malignant neoplasm of pleura: Secondary | ICD-10-CM | POA: Diagnosis present

## 2018-08-04 DIAGNOSIS — Z9981 Dependence on supplemental oxygen: Secondary | ICD-10-CM | POA: Diagnosis not present

## 2018-08-04 DIAGNOSIS — Z515 Encounter for palliative care: Secondary | ICD-10-CM | POA: Diagnosis not present

## 2018-08-04 DIAGNOSIS — R0602 Shortness of breath: Secondary | ICD-10-CM | POA: Diagnosis not present

## 2018-08-04 DIAGNOSIS — E785 Hyperlipidemia, unspecified: Secondary | ICD-10-CM | POA: Diagnosis not present

## 2018-08-04 DIAGNOSIS — J9601 Acute respiratory failure with hypoxia: Secondary | ICD-10-CM | POA: Diagnosis not present

## 2018-08-04 LAB — RESPIRATORY PANEL BY PCR

## 2018-08-04 LAB — CBC WITH DIFFERENTIAL/PLATELET
Abs Immature Granulocytes: 0.1 10*3/uL — ABNORMAL HIGH (ref 0.00–0.07)
Basophils Absolute: 0 10*3/uL (ref 0.0–0.1)
Basophils Relative: 0 %
Eosinophils Absolute: 0 10*3/uL (ref 0.0–0.5)
Eosinophils Relative: 0 %
HCT: 29.5 % — ABNORMAL LOW (ref 39.0–52.0)
Hemoglobin: 9.2 g/dL — ABNORMAL LOW (ref 13.0–17.0)
Immature Granulocytes: 1 %
Lymphocytes Relative: 2 %
Lymphs Abs: 0.5 10*3/uL — ABNORMAL LOW (ref 0.7–4.0)
MCH: 33 pg (ref 26.0–34.0)
MCHC: 31.2 g/dL (ref 30.0–36.0)
MCV: 105.7 fL — ABNORMAL HIGH (ref 80.0–100.0)
Monocytes Absolute: 0.3 10*3/uL (ref 0.1–1.0)
Monocytes Relative: 2 %
Neutro Abs: 17.9 10*3/uL — ABNORMAL HIGH (ref 1.7–7.7)
Neutrophils Relative %: 95 %
Platelets: 192 10*3/uL (ref 150–400)
RBC: 2.79 MIL/uL — ABNORMAL LOW (ref 4.22–5.81)
RDW: 14.6 % (ref 11.5–15.5)
WBC: 18.7 10*3/uL — ABNORMAL HIGH (ref 4.0–10.5)
nRBC: 0 % (ref 0.0–0.2)

## 2018-08-04 LAB — LIPID PANEL
Cholesterol: 83 mg/dL (ref 0–200)
HDL: 33 mg/dL — ABNORMAL LOW (ref >40)
LDL Cholesterol: 22 mg/dL (ref 0–99)
Total CHOL/HDL Ratio: 2.5 RATIO
Triglycerides: 140 mg/dL (ref <150)
VLDL: 28 mg/dL (ref 0–40)

## 2018-08-04 LAB — GLUCOSE, CAPILLARY
Glucose-Capillary: 337 mg/dL — ABNORMAL HIGH (ref 70–99)
Glucose-Capillary: 435 mg/dL — ABNORMAL HIGH (ref 70–99)
Glucose-Capillary: 423 mg/dL — ABNORMAL HIGH (ref 70–99)
Glucose-Capillary: 436 mg/dL — ABNORMAL HIGH (ref 70–99)

## 2018-08-04 LAB — HEMOGLOBIN A1C
Hgb A1c MFr Bld: 10.6 % — ABNORMAL HIGH (ref 4.8–5.6)
Mean Plasma Glucose: 257.52 mg/dL

## 2018-08-04 LAB — BASIC METABOLIC PANEL
Anion gap: 10 (ref 5–15)
BUN: 20 mg/dL (ref 8–23)
CO2: 26 mmol/L (ref 22–32)
Calcium: 7.8 mg/dL — ABNORMAL LOW (ref 8.9–10.3)
Chloride: 101 mmol/L (ref 98–111)
Creatinine, Ser: 0.91 mg/dL (ref 0.61–1.24)
GFR calc Af Amer: >60 mL/min (ref >60)
GFR calc non Af Amer: >60 mL/min (ref >60)
Glucose, Bld: 383 mg/dL — ABNORMAL HIGH (ref 70–99)
Potassium: 3.6 mmol/L (ref 3.5–5.1)
Sodium: 137 mmol/L (ref 135–145)

## 2018-08-04 LAB — TROPONIN I
Troponin I: <0.03 ng/mL (ref <0.03)
Troponin I: <0.03 ng/mL (ref <0.03)

## 2018-08-04 LAB — MAGNESIUM: Magnesium: 1.7 mg/dL (ref 1.7–2.4)

## 2018-08-04 MED ORDER — SODIUM CHLORIDE 0.9 % IV SOLN
1.0000 g | Freq: Three times a day (TID) | INTRAVENOUS | Status: DC
  Administered 2018-08-04 – 2018-08-07 (×9): 1 g via INTRAVENOUS
  Filled 2018-08-04 (×11): qty 1

## 2018-08-04 MED ORDER — MAGNESIUM SULFATE 4 GM/100ML IV SOLN
4.0000 g | Freq: Once | INTRAVENOUS | Status: AC
  Administered 2018-08-04: 4 g via INTRAVENOUS
  Filled 2018-08-04: qty 100

## 2018-08-04 MED ORDER — GUAIFENESIN ER 600 MG PO TB12
1200.0000 mg | ORAL_TABLET | Freq: Two times a day (BID) | ORAL | Status: DC
  Administered 2018-08-04 – 2018-08-08 (×9): 1200 mg via ORAL
  Filled 2018-08-04 (×10): qty 2

## 2018-08-04 MED ORDER — HYDROCODONE-HOMATROPINE 5-1.5 MG/5ML PO SYRP: 5.0000 mL | ORAL_SOLUTION | Freq: Four times a day (QID) | ORAL | Status: DC | PRN

## 2018-08-04 MED ORDER — INSULIN ASPART 100 UNIT/ML ~~LOC~~ SOLN
0.0000 [IU] | Freq: Three times a day (TID) | SUBCUTANEOUS | Status: DC
  Administered 2018-08-05: 15 [IU] via SUBCUTANEOUS
  Administered 2018-08-05: 8 [IU] via SUBCUTANEOUS
  Administered 2018-08-05: 11 [IU] via SUBCUTANEOUS
  Administered 2018-08-06: 5 [IU] via SUBCUTANEOUS
  Administered 2018-08-06: 11 [IU] via SUBCUTANEOUS
  Administered 2018-08-06: 3 [IU] via SUBCUTANEOUS
  Administered 2018-08-07: 5 [IU] via SUBCUTANEOUS
  Administered 2018-08-07: 8 [IU] via SUBCUTANEOUS
  Administered 2018-08-08: 2 [IU] via SUBCUTANEOUS

## 2018-08-04 MED ORDER — INSULIN ASPART 100 UNIT/ML ~~LOC~~ SOLN
11.0000 [IU] | Freq: Once | SUBCUTANEOUS | Status: AC
  Administered 2018-08-04: 11 [IU] via SUBCUTANEOUS

## 2018-08-04 MED ORDER — INSULIN GLARGINE 100 UNIT/ML ~~LOC~~ SOLN
8.0000 [IU] | Freq: Every day | SUBCUTANEOUS | Status: DC
  Filled 2018-08-04: qty 0.08

## 2018-08-04 MED ORDER — IPRATROPIUM-ALBUTEROL 0.5-2.5 (3) MG/3ML IN SOLN
3.0000 mL | Freq: Four times a day (QID) | RESPIRATORY_TRACT | Status: DC
  Administered 2018-08-04 – 2018-08-07 (×12): 3 mL via RESPIRATORY_TRACT
  Filled 2018-08-04 (×12): qty 3

## 2018-08-04 MED ORDER — BUDESONIDE 0.5 MG/2ML IN SUSP
0.5000 mg | Freq: Two times a day (BID) | RESPIRATORY_TRACT | Status: DC
  Administered 2018-08-04 – 2018-08-07 (×8): 0.5 mg via RESPIRATORY_TRACT
  Filled 2018-08-04 (×8): qty 2

## 2018-08-04 MED ORDER — LORATADINE 10 MG PO TABS
10.0000 mg | ORAL_TABLET | Freq: Every day | ORAL | Status: DC
  Administered 2018-08-04 – 2018-08-08 (×5): 10 mg via ORAL
  Filled 2018-08-04 (×5): qty 1

## 2018-08-04 MED ORDER — INSULIN ASPART 100 UNIT/ML ~~LOC~~ SOLN
10.0000 [IU] | Freq: Once | SUBCUTANEOUS | Status: AC
  Administered 2018-08-04: 10 [IU] via SUBCUTANEOUS

## 2018-08-04 MED ORDER — ARFORMOTEROL TARTRATE 15 MCG/2ML IN NEBU
15.0000 ug | INHALATION_SOLUTION | Freq: Two times a day (BID) | RESPIRATORY_TRACT | Status: DC
  Administered 2018-08-04 – 2018-08-07 (×8): 15 ug via RESPIRATORY_TRACT
  Filled 2018-08-04 (×8): qty 2

## 2018-08-04 MED ORDER — INSULIN ASPART 100 UNIT/ML ~~LOC~~ SOLN
19.0000 [IU] | Freq: Once | SUBCUTANEOUS | Status: AC
  Administered 2018-08-04: 19 [IU] via SUBCUTANEOUS

## 2018-08-04 MED ORDER — FLUTICASONE PROPIONATE 50 MCG/ACT NA SUSP
2.0000 | Freq: Every day | NASAL | Status: DC
  Administered 2018-08-05 – 2018-08-08 (×4): 2 via NASAL
  Filled 2018-08-04: qty 16

## 2018-08-04 MED ORDER — PANTOPRAZOLE SODIUM 40 MG PO TBEC
40.0000 mg | DELAYED_RELEASE_TABLET | Freq: Every day | ORAL | Status: DC
  Administered 2018-08-04 – 2018-08-07 (×4): 40 mg via ORAL
  Filled 2018-08-04 (×3): qty 1

## 2018-08-04 MED ORDER — INSULIN GLARGINE 100 UNIT/ML ~~LOC~~ SOLN
3.0000 [IU] | Freq: Once | SUBCUTANEOUS | Status: AC
  Administered 2018-08-04: 3 [IU] via SUBCUTANEOUS
  Filled 2018-08-04: qty 0.03

## 2018-08-04 NOTE — Care Management Obs Status (Signed)
MEDICARE OBSERVATION STATUS NOTIFICATION   Patient Details  Name: Martin [name] MRN: [id number] Date of Birth: [birth year]   Medicare Observation Status Notification Given:  Yes    Joaquin Courts, RN 08/04/2018, 2:10 PM

## 2018-08-04 NOTE — Progress Notes (Signed)
PROGRESS NOTE    Martin [name]  UXL:[id number] DOB: 02/28/1931 DOA: 08/03/2018 PCP: Susy Frizzle, MD    Brief Narrative:  HPI per Dr. Graciella Freer Martin [name] is a 85 y.o. male with medical history significant of COPD, on 2 L oxygen at home, hypertension, hyperlipidemia, DM, stroke, GERD, metastasized non-small cell lung cancer, GI bleeding, CAD, atrial fibrillation not on anticoagulants, who presents with shortness of breath.  Patient states that he has been having shortness of breath in the past 3 days, which has been progressively worsening.  He has cough with streaks of dark red blood production.  Denies fever or chills.  No chest pain.  Patient uses 2 L oxygen at home, currently his saturation is 96% on 2 L oxygen in ED.  Patient denies nausea, vomiting, diarrhea, abdominal pain.  No symptoms of UTI. Pt states that he is living alone at home.  ED Course: pt was found to have WBC 16.3, troponin 0.03, lactic acid of 1.7, BNP 259.9, negative COVID-19 test, electrolytes renal function okay, temperature normal, soft blood pressure, heart rate 8290s, RR 18-21.  Patient is placed on telemetry bed for observation.  CXR showed: 1. Prominent enlargement of the pleural-based/chest wall mass along the left upper chest with lytic destruction of the left fourth and part of the fifth rib. Rapid growth since 05/19/2018. 2. Enlarging right apical pulmonary nodule compatible with malignancy. 3. New retrocardiac nodularity measuring 2.8 by 2.0 cm, possibly malignancy or focal pneumonia. 4. Aortic Atherosclerosis (ICD10-I70.0).  Assessment & Plan:   Principal Problem:   COPD exacerbation ([place]) Active Problems:   HCAP (healthcare-associated pneumonia)   HLD (hyperlipidemia)   Diabetes mellitus, type 2 ([place])   Stroke [facility])   Essential hypertension   Anxiety state   Metastatic lung cancer (metastasis from lung to other site) ([place])   Elevated troponin   Macrocytic anemia   Hemoptysis   GERD  (gastroesophageal reflux disease)   CAD (coronary artery disease)   COPD (chronic obstructive pulmonary disease) ([place])   1 acute COPD exacerbation with chronic respiratory failure with hypoxia Patient presenting with worsening shortness of breath, productive cough and noted to be on 2 L nasal cannula on home.  Patient not noted to have any increased O2 requirements however with complaints of shortness of breath.  Chest x-ray done showing retrocardiac nodularity possibly malignancy or focal pneumonia.  Patient noted to have a leukocytosis on admission with a white count of 16.3 however was afebrile.  Patient recently hospitalized.  Patient noted to have rhonchi on auscultation.  Patient with some reports of hemoptysis.  Patient denies any acute chest pain and no increased O2 requirements and as such low suspicion for PE.  Continue IV Solu-Medrol.  Placed on Pulmicort and Brovana and scheduled duo nebs, Claritin, Flonase, PPI.  Continue azithromycin.  Due to concerns for possible pneumonia and metastatic disease patient likely immunocompromised and as such we will place empirically on IV cefepime to cover for probable healthcare associated pneumonia.  Palliative care consulted and are following.  2.  Probable healthcare associated pneumonia Patient with underlying metastatic lung cancer presenting worsening shortness of breath, productive cough, poor air movement on 2 L nasal cannula.  Chest x-ray done concerning for retrocardiac nodularity possibly malignancy versus focal pneumonia.  Patient recently hospitalized.  Patient immunocompromise.  Check a sputum Gram stain and culture.  Check a urine Legionella antigen.  Check a urine pneumococcus antigen.  Place on duo nebs.  Patient already on azithromycin which we  will continue for now.  Add IV cefepime.  Supportive care.  3.  Metastatic lung cancer with mets from the lung to other site Patient with stage IV non-small cell carcinoma noted to present with  right upper lobe lung mass in addition to metastatic disease to the left fourth rib as well as right sacral area and pleural-based nodularity.  Patient status post palliative radiotherapy to the right sacral metastatic bone lesion under the care of Dr. Tammi Klippel.  Patient was on chemotherapy which was stopped a month ago.  Palliative care consulted and patient likely when medically stable to be discharged home with hospice versus residential hospice home.  Appreciate palliative care input and recommendations.  4.  Hyperlipidemia Continue statin.  5.  Diet-controlled diabetes type 2 mellitus without complications Hemoglobin A1c was 6.7 on 03/30/2018.  Patient noted to have elevated CBGs likely secondary to IV steroids.  CBG noted to be 337 this morning.  Increase Lantus to 8 units daily.  Change sliding scale to moderate scale insulin.  Follow monitor with steroid taper.  6.  History of CVA Plavix on hold due to complaints of hemoptysis.  Continue Lipitor.  7.  Hypertension IV hydralazine as needed.  8.  Anxiety Continue Xanax.  9.  History of coronary artery disease and elevated troponin Patient denies any acute chest pain.  Patient did have some chest discomfort.  Cardiac enzymes were cycled and negative x3.  Plavix on hold due to hemoptysis.  Continue Lipitor.  LDL of 22.  10.  Macrocytic anemia Hemoglobin stable at 9.2.  11.  Hemoptysis Likely secondary to metastatic lung cancer.  Hold Plavix.  12.  Gastroesophageal reflux disease Pepcid.   DVT prophylaxis: SCDs Code Status: DNR Family Communication: Updated patient.  No family at bedside. Disposition Plan: Home with Hospice versus residential hospice home when medically stable.   Consultants:   Palliative care: Dr. Rowe Pavy 08/04/2018  Procedures:  Chest x-ray 08/03/2018    Antimicrobials:   Azithromycin 08/03/2018  IV cefepime 08/04/2018   Subjective: Patient laying in bed noted to have a rattling cough with some  hemoptysis.  Patient states still with some shortness of breath slightly improved since admission however not at baseline.  No chest pain.  Objective: Vitals:   08/04/18 0829 08/04/18 0831 08/04/18 1400 08/04/18 1404  BP:   (!) 128/53   Pulse:   79   Resp:   20   Temp:      TempSrc:      SpO2: 95% 95% 98% 98%  Weight:      Height:        Intake/Output Summary (Last 24 hours) at 08/04/2018 1521 Last data filed at 08/04/2018 0950 Gross per 24 hour  Intake 1330 ml  Output 375 ml  Net 955 ml   Filed Weights   08/03/18 1814  Weight: 63.5 kg    Examination:  General exam: Appears calm and comfortable  Respiratory system: Poor air movement.  Minimal expiratory wheezing.  Some scattered coarse breath sounds.  No crackles.  Speaking in full sentences.  Respiratory effort normal. Cardiovascular system: S1 & S2 heard, RRR. No JVD, murmurs, rubs, gallops or clicks. No pedal edema. Gastrointestinal system: Abdomen is nondistended, soft and nontender. No organomegaly or masses felt. Normal bowel sounds heard. Central nervous system: Alert and oriented. No focal neurological deficits. Extremities: Symmetric 5 x 5 power. Skin: No rashes, lesions or ulcers Psychiatry: Judgement and insight appear normal. Mood & affect appropriate.     Data Reviewed:  I have personally reviewed following labs and imaging studies  CBC: Recent Labs  Lab 08/03/18 1842 08/04/18 1003  WBC 16.3* 18.7*  NEUTROABS 15.3* 17.9*  HGB 9.5* 9.2*  HCT 31.4* 29.5*  MCV 104.7* 105.7*  PLT 188 662   Basic Metabolic Panel: Recent Labs  Lab 08/03/18 1842 08/04/18 0414  NA 134* 137  K 3.7 3.6  CL 95* 101  CO2 28 26  GLUCOSE 494* 383*  BUN 21 20  CREATININE 0.98 0.91  CALCIUM 8.0* 7.8*  MG  --  1.7   GFR: Estimated Creatinine Clearance: 53.3 mL/min (by C-G formula based on SCr of 0.91 mg/dL). Liver Function Tests: Recent Labs  Lab 08/03/18 1842  AST 12*  ALT 11  ALKPHOS 118  BILITOT 0.7  PROT  5.6*  ALBUMIN 2.2*   No results for input(s): LIPASE, AMYLASE in the last 168 hours. No results for input(s): AMMONIA in the last 168 hours. Coagulation Profile: No results for input(s): INR, PROTIME in the last 168 hours. Cardiac Enzymes: Recent Labs  Lab 08/03/18 1842 08/03/18 2319 08/04/18 0414 08/04/18 1003  TROPONINI 0.03* <0.03 <0.03 <0.03   BNP (last 3 results) No results for input(s): PROBNP in the last 8760 hours. HbA1C: Recent Labs    08/04/18 0414  HGBA1C 10.6*   CBG: Recent Labs  Lab 08/03/18 2233 08/04/18 0732 08/04/18 1142  GLUCAP 427* 337* 435*   Lipid Profile: Recent Labs    08/04/18 0414  CHOL 83  HDL 33*  LDLCALC 22  TRIG 140  CHOLHDL 2.5   Thyroid Function Tests: No results for input(s): TSH, T4TOTAL, FREET4, T3FREE, THYROIDAB in the last 72 hours. Anemia Panel: No results for input(s): VITAMINB12, FOLATE, FERRITIN, TIBC, IRON, RETICCTPCT in the last 72 hours. Sepsis Labs: Recent Labs  Lab 08/03/18 1843 08/03/18 2213  LATICACIDVEN 1.7 1.0    Recent Results (from the past 240 hour(s))  SARS Coronavirus 2 (CEPHEID - Performed in [facility] hospital lab), Hosp Order     Status: None   Collection Time: 08/03/18  6:33 PM  Result Value Ref Range Status   SARS Coronavirus 2 NEGATIVE NEGATIVE Final    Comment: (NOTE) If result is NEGATIVE SARS-CoV-2 target nucleic acids are NOT DETECTED. The SARS-CoV-2 RNA is generally detectable in upper and lower  respiratory specimens during the acute phase of infection. The lowest  concentration of SARS-CoV-2 viral copies this assay can detect is 250  copies / mL. A negative result does not preclude SARS-CoV-2 infection  and should not be used as the sole basis for treatment or other  patient management decisions.  A negative result may occur with  improper specimen collection / handling, submission of specimen other  than nasopharyngeal swab, presence of viral mutation(s) within the  areas  targeted by this assay, and inadequate number of viral copies  (<250 copies / mL). A negative result must be combined with clinical  observations, patient history, and epidemiological information. If result is POSITIVE SARS-CoV-2 target nucleic acids are DETECTED. The SARS-CoV-2 RNA is generally detectable in upper and lower  respiratory specimens dur ing the acute phase of infection.  Positive  results are indicative of active infection with SARS-CoV-2.  Clinical  correlation with patient history and other diagnostic information is  necessary to determine patient infection status.  Positive results do  not rule out bacterial infection or co-infection with other viruses. If result is PRESUMPTIVE POSTIVE SARS-CoV-2 nucleic acids MAY BE PRESENT.   A presumptive positive result was  obtained on the submitted specimen  and confirmed on repeat testing.  While 2019 novel coronavirus  (SARS-CoV-2) nucleic acids may be present in the submitted sample  additional confirmatory testing may be necessary for epidemiological  and / or clinical management purposes  to differentiate between  SARS-CoV-2 and other Sarbecovirus currently known to infect humans.  If clinically indicated additional testing with an alternate test  methodology [phone]) is advised. The SARS-CoV-2 RNA is generally  detectable in upper and lower respiratory sp ecimens during the acute  phase of infection. The expected result is Negative. Fact Sheet for Patients:  StrictlyIdeas.no Fact Sheet for Healthcare Providers: BankingDealers.co.za This test is not yet approved or cleared by the [place] FDA and has been authorized for detection and/or diagnosis of SARS-CoV-2 by FDA under an Emergency Use Authorization (EUA).  This EUA will remain in effect (meaning this test can be used) for the duration of the COVID-19 declaration under Section 564(b)(1) of the Act, 21 U.S.C. section  360bbb-3(b)(1), unless the authorization is terminated or revoked sooner. Performed at [facility], [place] [address], [place], [place] [address]   Blood culture (routine x 2)     Status: None (Preliminary result)   Collection Time: 08/03/18  6:43 PM  Result Value Ref Range Status   Specimen Description   Final    BLOOD LEFT ANTECUBITAL Performed at [facility] [address] [place], [place] [address]    Special Requests   Final    BOTTLES DRAWN AEROBIC AND ANAEROBIC Blood Culture adequate volume Performed at [facility] [address] [place], [place] [address]    Culture   Final    NO GROWTH < 12 HOURS Performed at [facility] [address], [place], [place] [address]    Report Status PENDING  Incomplete  Blood culture (routine x 2)     Status: None (Preliminary result)   Collection Time: 08/03/18  6:52 PM  Result Value Ref Range Status   Specimen Description   Final    BLOOD BLOOD LEFT FOREARM Performed at [facility] [address] [place], [place] [address]    Special Requests   Final    BOTTLES DRAWN AEROBIC ONLY Blood Culture results may not be optimal due to an inadequate volume of blood received in culture bottles Performed at [facility] [address], [place], [place] [address]    Culture   Final    NO GROWTH < 12 HOURS Performed at [facility] [address], [place], [place] [address]    Report Status PENDING  Incomplete  Respiratory Panel by PCR     Status: None   Collection Time: 08/03/18 10:13 PM  Result Value Ref Range Status   Adenovirus NOT DETECTED NOT DETECTED Final   Coronavirus 229E NOT DETECTED NOT DETECTED Final    Comment: (NOTE) The Coronavirus on the Respiratory Panel, DOES NOT test for the novel  Coronavirus (2019 nCoV)    Coronavirus HKU1 NOT DETECTED NOT DETECTED Final   Coronavirus NL63 NOT DETECTED NOT DETECTED Final    Coronavirus OC43 NOT DETECTED NOT DETECTED Final   Metapneumovirus NOT DETECTED NOT DETECTED Final   Rhinovirus / Enterovirus NOT DETECTED NOT DETECTED Final   Influenza A NOT DETECTED NOT DETECTED Final   Influenza B NOT DETECTED NOT DETECTED Final   Parainfluenza Virus 1 NOT DETECTED NOT DETECTED Final   Parainfluenza Virus 2 NOT DETECTED NOT DETECTED  Final   Parainfluenza Virus 3 NOT DETECTED NOT DETECTED Final   Parainfluenza Virus 4 NOT DETECTED NOT DETECTED Final   Respiratory Syncytial Virus NOT DETECTED NOT DETECTED Final   Bordetella pertussis NOT DETECTED NOT DETECTED Final   Chlamydophila pneumoniae NOT DETECTED NOT DETECTED Final   Mycoplasma pneumoniae NOT DETECTED NOT DETECTED Final    Comment: Performed at [facility], [place] [address] [place], [place] [address]         Radiology Studies: Dg Chest Port 1 View  Result Date: 08/03/2018 CLINICAL DATA:  Lung cancer. Shortness of breath. Discontinued chemotherapy 1 month ago. EXAM: PORTABLE CHEST 1 VIEW COMPARISON:  05/19/2018 FINDINGS: Although the right basilar airspace opacity has cleared, there has been rapid growth of the left upper thoracic pleural-based tumor, with increase lobularity and considerable increase in size from 05/19/2018. Lytic absence of portions of the fourth and fifth ribs. The mass is currently about 16.2 by 5.3 cm, previously 7.0 by 3.7 cm. Atherosclerotic calcification of the aortic arch. There is evidence of chronic rotator cuff tear on the right with prominent degenerative glenohumeral arthropathy. Nodularity at the right lung apex compatible with enlarging right apical tumor, currently 2.7 by 2.2 cm. New retrocardiac density, possibly a new left lower lobe nodule, 2.8 by 2.0 cm. IMPRESSION: 1. Prominent enlargement of the pleural-based/chest wall mass along the left upper chest with lytic destruction of the left fourth and part of the fifth rib. Rapid growth since 05/19/2018. 2. Enlarging right  apical pulmonary nodule compatible with malignancy. 3. New retrocardiac nodularity measuring 2.8 by 2.0 cm, possibly malignancy or focal pneumonia. 4.  Aortic Atherosclerosis (ICD10-I70.0). Electronically Signed   By: Van Clines M.D.   On: 08/03/2018 18:52        Scheduled Meds:  ALPRAZolam  0.5 mg Oral QHS   arformoterol  15 mcg Nebulization BID   atorvastatin  20 mg Oral q1800   azithromycin  250 mg Oral QHS   budesonide (PULMICORT) nebulizer solution  0.5 mg Nebulization BID   famotidine  20 mg Oral BID   fluticasone  2 spray Each Nare Daily   guaiFENesin  1,200 mg Oral BID   insulin aspart  0-15 Units Subcutaneous TID WC   [START ON 08/05/2018] insulin glargine  8 Units Subcutaneous Daily   ipratropium-albuterol  3 mL Nebulization Q6H   loratadine  10 mg Oral Daily   methylPREDNISolone (SOLU-MEDROL) injection  60 mg Intravenous Q12H   pantoprazole  40 mg Oral Q0600   traZODone  25 mg Oral QHS   Continuous Infusions:  ceFEPime (MAXIPIME) IV 1 g (08/04/18 1423)     LOS: 0 days    Time spent: 40 minutes    Irine Seal, MD [facility]  If 7PM-7AM, please contact night-coverage [email or web address] 08/04/2018, 3:21 PM

## 2018-08-04 NOTE — Progress Notes (Signed)
Pt. refusing scheduled aerosols, made aware to notify if PRN needed, stated he uses Symbicort at home BID which has worked well for him, currently not ordered at this time.

## 2018-08-04 NOTE — Progress Notes (Signed)
CBG at lunch 435 MD made aware and new orders placed

## 2018-08-04 NOTE — Progress Notes (Signed)
Pt's evening blood sugar 423. MD made aware and new orders placed. No other acute changes noted with Pt's assessment. Maintain current plan of care

## 2018-08-04 NOTE — Consult Note (Signed)
Consultation Note Date: 08/04/2018   Patient Name: Martin [name]  DOB: Nov 23, 1930  MRN: [id number]  Age / Sex: 85 y.o., male  PCP: Susy Frizzle, MD Referring Physician: Eugenie Filler, MD  Reason for Consultation: Establishing goals of care  HPI/Patient Profile: 85 y.o. male admitted on 08/03/2018    Clinical Assessment and Goals of Care: 85 year old gentleman, known to palliative medicine service, seen by the undersigned in previous hospitalization, past medical history significant for chronic obstructive pulmonary disease is on 2 L oxygen at home, also with a history of metastatic non-small cell lung cancer under the care of Dr. Earlie Server, history of hypertension dyslipidemia diabetes stroke atrial fibrillation coronary artery disease GI bleeding and gastroesophageal reflux disease.  Patient did a telemedicine visit with his primary oncologist last month in April 2020.  His treatments had been on hold because of ongoing functional decline.  It was advised that the patient undergo repeat imaging in the coming months.  Patient was initially in assisted living facility.  For the past 5-6 weeks, he has been at home under the care of his grandson who is also his healthcare power of attorney.  Patient has been admitted to hospital medicine service with weakness, shortness of breath, progressive functional decline, ongoing minimal oral intake, hemoptysis.  Palliative medicine consultation has been requested for ongoing goals of care discussions.  Patient is awake alert resting in bed.  He still has cough which is somewhat productive.  He appears pale and weak.  He has minimal appetite.  At present, he does admit to some chest wall discomfort especially left upper chest wall.  I introduced myself and palliative care as follows: Palliative medicine is specialized medical care for people living with serious  illness. It focuses on providing relief from the symptoms and stress of a serious illness. The goal is to improve quality of life for both the patient and the family.  Goals wishes and values important to the patient attempted to be elicited.  Patient states that he was in the process of discussing with his grandson about hospice services.  We discussed in detail about hospice philosophy of care.  Discussed about focusing on symptom management and amount of care that focuses exclusively on comfort as primary goal.  Discussed about recent imaging showing increased chest wall mass size as well as increased size of right apical pulmonary nodule signifying ongoing advancement of his malignancy.  Patient is aware.  We talked about appropriate pain and non-pain symptom management.  Patient is on appropriate PRN benzodiazepines and opioids for symptom management.  Additionally, he is also on antiemetic and bowel regimen.  To continue the same.  Subsequently, call placed and also discussed with the patient's grandson medical power of attorney [name] at [phone].  He states that the patient has had ongoing functional decline as well as has not been eating and has escalating symptom burden with cough and hemoptysis at home.  Patient has been reportedly talking about being ready to go.  He has been  asking to be transferred to the palliative care center on [address] here in [place], [place].  I discussed with the grandson about the type of care that can be provided in a residential hospice.  All of his questions addressed to the best of my ability.    [place] son [name] at [phone].    SUMMARY OF RECOMMENDATIONS   Agree with DO NOT RESUSCITATE/DO NOT INTUBATE. Continue current pain and non-pain symptom management medications.  Medication history reviewed.  Patient has Xanax, Norco, Hycodan syrup as well as IV morphine to be used as needed.  Additionally, he is on Zofran and  MiraLAX for antiemetic and bowel regimen as well. Continue current mode of care. Ongoing discussions with patient and his healthcare power of attorney about residential hospice at the time of discharge.  It does appear that the patient's prognosis could be limited to about 2 to 3 weeks at this time.   Monitor hospital course and disease trajectory.  Discussed with TRH MD.  Thank you for the consult.  Code Status/Advance Care Planning:  DNR    Symptom Management:    as above   Palliative Prophylaxis:   Delirium Protocol   Psycho-social/Spiritual:   Desire for further Chaplaincy support:yes  Additional Recommendations: Education on Hospice  Prognosis:   < 2 weeks  Discharge Planning: Hospice facility      Primary Diagnoses: Present on Admission: . Anxiety state . Essential hypertension . HLD (hyperlipidemia) . Metastatic lung cancer (metastasis from lung to other site) [facility]) . Stroke ([place]) . COPD exacerbation ([place]) . Elevated troponin . Macrocytic anemia . Hemoptysis . GERD (gastroesophageal reflux disease) . CAD (coronary artery disease)   I have reviewed the medical record, interviewed the patient and family, and examined the patient. The following aspects are pertinent.  Past Medical History:  Diagnosis Date  . Arthritis   . Cancer ([place])   . CKD (chronic kidney disease) stage 3, GFR 30-59 ml/min (HCC)   . COPD (chronic obstructive pulmonary disease) ([place])   . Diabetes mellitus    denies, reports resolved with diet  . GERD (gastroesophageal reflux disease)   . H/O: GI bleed   . Hiatal hernia   . Hyperlipidemia   . Hypertension   . Myocardial infarction [facility])    1978 & 2011  . Peripheral vascular disease, unspecified ([place])   . Renal artery stenosis ([place])   . Stroke [facility])    Social History   Socioeconomic History  . Marital status: Widowed    Spouse name: Not on file  . Number of children: Not on file  . Years of education: Not on file  .  Highest education level: Not on file  Occupational History  . Occupation: retired  Scientific laboratory technician  . Financial resource strain: Not on file  . Food insecurity:    Worry: Not on file    Inability: Not on file  . Transportation needs:    Medical: Not on file    Non-medical: Not on file  Tobacco Use  . Smoking status: Former Smoker    Packs/day: 1.00    Years: 65.00    Pack years: 65.00    Types: Cigarettes    Last attempt to quit: 07/08/2009    Years since quitting: 9.0  . Smokeless tobacco: Never Used  Substance and Sexual Activity  . Alcohol use: No  . Drug use: No  . Sexual activity: Not on file  Lifestyle  . Physical activity:  Days per week: Not on file    Minutes per session: Not on file  . Stress: Not on file  Relationships  . Social connections:    Talks on phone: Not on file    Gets together: Not on file    Attends religious service: Not on file    Active member of club or organization: Not on file    Attends meetings of clubs or organizations: Not on file    Relationship status: Not on file  Other Topics Concern  . Not on file  Social History Narrative  . Not on file   Family History  Problem Relation Age of Onset  . Cancer Father 83       LIVER CANCER  . Dementia Brother 14  . CAD Brother    Scheduled Meds: . ALPRAZolam  0.5 mg Oral QHS  . arformoterol  15 mcg Nebulization BID  . atorvastatin  20 mg Oral q1800  . azithromycin  250 mg Oral QHS  . budesonide (PULMICORT) nebulizer solution  0.5 mg Nebulization BID  . famotidine  20 mg Oral BID  . fluticasone  2 spray Each Nare Daily  . guaiFENesin  1,200 mg Oral BID  . insulin aspart  0-15 Units Subcutaneous TID WC  . [START ON 08/05/2018] insulin glargine  8 Units Subcutaneous Daily  . ipratropium-albuterol  3 mL Nebulization Q6H  . loratadine  10 mg Oral Daily  . methylPREDNISolone (SOLU-MEDROL) injection  60 mg Intravenous Q12H  . pantoprazole  40 mg Oral Q0600  . traZODone  25 mg Oral QHS    Continuous Infusions: . ceFEPime (MAXIPIME) IV    . magnesium sulfate bolus IVPB 4 g (08/04/18 1154)   PRN Meds:.acetaminophen, albuterol, hydrALAZINE, HYDROcodone-acetaminophen, HYDROcodone-homatropine, loperamide, morphine injection, nitroGLYCERIN, ondansetron, polyethylene glycol Medications Prior to Admission:  Prior to Admission medications   Medication Sig Start Date End Date Taking? Authorizing Provider  ALPRAZolam Duanne Moron) 0.5 MG tablet Take 1 tablet (0.5 mg total) by mouth at bedtime. 07/15/18  Yes Susy Frizzle, MD  atorvastatin (LIPITOR) 20 MG tablet TAKE 1 TABLET BY MOUTH ONCE DAILY AT  6PM. Patient taking differently: Take 20 mg by mouth daily.  03/27/18  Yes Susy Frizzle, MD  budesonide-formoterol [facility]) 160-4.5 MCG/ACT inhaler Inhale 2 puffs into the lungs 2 (two) times daily as needed (for COPD flares).   Yes [provider]  clopidogrel (PLAVIX) 75 MG tablet Take 75 mg by mouth daily. 06/10/18  Yes [provider]  HYDROcodone-acetaminophen (NORCO/VICODIN) 5-325 MG tablet Take 1 tablet by mouth every 6 (six) hours as needed for moderate pain. 05/21/18  Yes Shelly Coss, MD  loperamide (IMODIUM) 2 MG capsule Take 1 capsule (2 mg total) by mouth 3 (three) times daily as needed for diarrhea or loose stools. 04/24/18  Yes Rai, Ripudeep K, MD  ondansetron (ZOFRAN ODT) 4 MG disintegrating tablet Take 1 tablet (4 mg total) by mouth every 8 (eight) hours as needed for nausea or vomiting. 04/24/18  Yes Rai, Ripudeep K, MD  polyethylene glycol (MIRALAX / GLYCOLAX) packet Take 17 g by mouth daily as needed for moderate constipation or severe constipation. 04/24/18  Yes Rai, Ripudeep K, MD  predniSONE (DELTASONE) 10 MG tablet Take 1 tablet (10 mg total) by mouth daily with breakfast. 07/15/18  Yes Susy Frizzle, MD  traZODone (DESYREL) 25 mg TABS tablet Take 25 mg by mouth at bedtime.   Yes [provider]  famotidine (PEPCID) 20 MG tablet TAKE 1 TABLET  BY  MOUTH TWICE DAILY Patient not taking: Reported on 08/03/2018 07/27/17   Susy Frizzle, MD  metoprolol succinate (TOPROL-XL) 25 MG 24 hr tablet TAKE 1 TABLET BY MOUTH ONCE DAILY Patient not taking: No sig reported 04/12/18   Susy Frizzle, MD  NITROSTAT 0.4 MG SL tablet DISSOLVE ONE TABLET UNDER THE TONGUE AS DIRECTED Patient taking differently: Place 0.4 mg under the tongue every 5 (five) minutes as needed for chest pain. Max 3 doses 08/09/15   Susy Frizzle, MD   Allergies  Allergen Reactions  . Other Anaphylaxis    Chemo drugs or ivs   Review of Systems +chest wall pain at times  Physical Exam Frail elderly gentleman Coarse breath sounds S1 S2 Abdomen is not distended No edema Has muscle wasting Non focal A little hard of hearing sometimes  Vital Signs: BP 123/63 (BP Location: Left Arm)   Pulse 89   Temp 99.1 F (37.3 C) (Oral)   Resp 16   Ht 5\' 6"  (1.676 m)   Wt 63.5 kg   SpO2 95%   BMI 22.60 kg/m  Pain Scale: 0-10   Pain Score: 0-No pain   SpO2: SpO2: 95 % O2 Device:SpO2: 95 % O2 Flow Rate: .O2 Flow Rate (L/min): 2 L/min  IO: Intake/output summary:   Intake/Output Summary (Last 24 hours) at 08/04/2018 1249 Last data filed at 08/04/2018 8841 Gross per 24 hour  Intake 1330 ml  Output 375 ml  Net 955 ml    LBM: Last BM Date: 08/03/18 Baseline Weight: Weight: 63.5 kg Most recent weight: Weight: 63.5 kg     Palliative Assessment/Data:   Flowsheet Rows     Most Recent Value  Intake Tab  Referral Department  Hospitalist  Unit at Time of Referral  Med/Surg Unit  Palliative Care Primary Diagnosis  Cancer  Palliative Care Type  Return patient Palliative Care  Reason for referral  Clarify Goals of Care  Date first seen by Palliative Care  08/04/18  Clinical Assessment  Palliative Performance Scale Score  30%  Pain Max last 24 hours  4  Pain Min Last 24 hours  3  Dyspnea Max Last 24 Hours  5  Dyspnea Min Last 24 hours  4  Psychosocial & Spiritual  Assessment  Palliative Care Outcomes  Patient/Family meeting held?  Yes  Who was at the meeting?  HCPOA grand son Mr Dorann Ou over the phone.       Time In:  11 Time Out:  12.10 Time Total:   70 min  Greater than 50%  of this time was spent counseling and coordinating care related to the above assessment and plan.  Signed by: Loistine Chance, MD [phone]  Please contact Palliative Medicine Team phone at [phone] for questions and concerns.  For individual provider: See Shea Evans

## 2018-08-05 DIAGNOSIS — R0602 Shortness of breath: Secondary | ICD-10-CM

## 2018-08-05 LAB — CBC WITH DIFFERENTIAL/PLATELET
Abs Immature Granulocytes: 0.16 10*3/uL — ABNORMAL HIGH (ref 0.00–0.07)
Basophils Absolute: 0 10*3/uL (ref 0.0–0.1)
Basophils Relative: 0 %
Eosinophils Absolute: 0 10*3/uL (ref 0.0–0.5)
Eosinophils Relative: 0 %
HCT: 24.5 % — ABNORMAL LOW (ref 39.0–52.0)
Hemoglobin: 7.5 g/dL — ABNORMAL LOW (ref 13.0–17.0)
Immature Granulocytes: 1 %
Lymphocytes Relative: 2 %
Lymphs Abs: 0.3 10*3/uL — ABNORMAL LOW (ref 0.7–4.0)
MCH: 32.2 pg (ref 26.0–34.0)
MCHC: 30.6 g/dL (ref 30.0–36.0)
MCV: 105.2 fL — ABNORMAL HIGH (ref 80.0–100.0)
Monocytes Absolute: 0.3 10*3/uL (ref 0.1–1.0)
Monocytes Relative: 2 %
Neutro Abs: 13.6 10*3/uL — ABNORMAL HIGH (ref 1.7–7.7)
Neutrophils Relative %: 95 %
Platelets: 157 10*3/uL (ref 150–400)
RBC: 2.33 MIL/uL — ABNORMAL LOW (ref 4.22–5.81)
RDW: 14.5 % (ref 11.5–15.5)
WBC: 14.4 10*3/uL — ABNORMAL HIGH (ref 4.0–10.5)
nRBC: 0 % (ref 0.0–0.2)

## 2018-08-05 LAB — BASIC METABOLIC PANEL
Anion gap: 7 (ref 5–15)
BUN: 26 mg/dL — ABNORMAL HIGH (ref 8–23)
CO2: 24 mmol/L (ref 22–32)
Calcium: 7.4 mg/dL — ABNORMAL LOW (ref 8.9–10.3)
Chloride: 102 mmol/L (ref 98–111)
Creatinine, Ser: 0.92 mg/dL (ref 0.61–1.24)
GFR calc Af Amer: >60 mL/min (ref >60)
GFR calc non Af Amer: >60 mL/min (ref >60)
Glucose, Bld: 316 mg/dL — ABNORMAL HIGH (ref 70–99)
Potassium: 3.7 mmol/L (ref 3.5–5.1)
Sodium: 133 mmol/L — ABNORMAL LOW (ref 135–145)

## 2018-08-05 LAB — FERRITIN: Ferritin: 1381 ng/mL — ABNORMAL HIGH (ref 24–336)

## 2018-08-05 LAB — MAGNESIUM: Magnesium: 2.2 mg/dL (ref 1.7–2.4)

## 2018-08-05 LAB — IRON AND TIBC
Iron: 22 ug/dL — ABNORMAL LOW (ref 45–182)
Saturation Ratios: 20 % (ref 17.9–39.5)
TIBC: 107 ug/dL — ABNORMAL LOW (ref 250–450)
UIBC: 85 ug/dL

## 2018-08-05 LAB — GLUCOSE, CAPILLARY
Glucose-Capillary: 295 mg/dL — ABNORMAL HIGH (ref 70–99)
Glucose-Capillary: 414 mg/dL — ABNORMAL HIGH (ref 70–99)
Glucose-Capillary: 413 mg/dL — ABNORMAL HIGH (ref 70–99)
Glucose-Capillary: 356 mg/dL — ABNORMAL HIGH (ref 70–99)
Glucose-Capillary: 376 mg/dL — ABNORMAL HIGH (ref 70–99)
Glucose-Capillary: 349 mg/dL — ABNORMAL HIGH (ref 70–99)
Glucose-Capillary: 223 mg/dL — ABNORMAL HIGH (ref 70–99)
Glucose-Capillary: 88 mg/dL (ref 70–99)

## 2018-08-05 LAB — FOLATE: Folate: 6.5 ng/mL (ref >5.9)

## 2018-08-05 LAB — HEMOGLOBIN AND HEMATOCRIT, BLOOD
HCT: 25.2 % — ABNORMAL LOW (ref 39.0–52.0)
Hemoglobin: 7.5 g/dL — ABNORMAL LOW (ref 13.0–17.0)

## 2018-08-05 LAB — STREP PNEUMONIAE URINARY ANTIGEN: Strep Pneumo Urinary Antigen: NEGATIVE

## 2018-08-05 LAB — VITAMIN B12: Vitamin B-12: 427 pg/mL (ref 180–914)

## 2018-08-05 MED ORDER — INSULIN ASPART 100 UNIT/ML ~~LOC~~ SOLN
3.0000 [IU] | Freq: Three times a day (TID) | SUBCUTANEOUS | Status: DC
  Administered 2018-08-05 – 2018-08-08 (×9): 3 [IU] via SUBCUTANEOUS

## 2018-08-05 MED ORDER — INSULIN ASPART 100 UNIT/ML ~~LOC~~ SOLN
19.0000 [IU] | Freq: Once | SUBCUTANEOUS | Status: AC
  Administered 2018-08-05: 19 [IU] via SUBCUTANEOUS

## 2018-08-05 MED ORDER — INSULIN GLARGINE 100 UNIT/ML ~~LOC~~ SOLN
12.0000 [IU] | Freq: Every day | SUBCUTANEOUS | Status: DC
  Administered 2018-08-05 – 2018-08-08 (×4): 12 [IU] via SUBCUTANEOUS
  Filled 2018-08-05 (×4): qty 0.12

## 2018-08-05 MED ORDER — METHYLPREDNISOLONE SODIUM SUCC 40 MG IJ SOLR
40.0000 mg | Freq: Two times a day (BID) | INTRAMUSCULAR | Status: DC
  Administered 2018-08-05 – 2018-08-06 (×3): 40 mg via INTRAVENOUS
  Filled 2018-08-05 (×3): qty 1

## 2018-08-05 NOTE — Progress Notes (Signed)
Daily Progress Note   Patient Name: Martin [name]       Date: 08/05/2018 DOB: 13-Oct-1930  Age: 85 y.o. MRN#: [id number] Attending Physician: Eugenie Filler, MD Primary Care Physician: Susy Frizzle, MD Admit Date: 08/03/2018  Reason for Consultation/Follow-up: Establishing goals of care  Subjective:  patient appears weak, resting in bed. Still with some cough, no hemoptysis noted. Oral intake minimal.  PPS 30%   Length of Stay: 1  Current Medications: Scheduled Meds:  . ALPRAZolam  0.5 mg Oral QHS  . arformoterol  15 mcg Nebulization BID  . atorvastatin  20 mg Oral q1800  . azithromycin  250 mg Oral QHS  . budesonide (PULMICORT) nebulizer solution  0.5 mg Nebulization BID  . famotidine  20 mg Oral BID  . fluticasone  2 spray Each Nare Daily  . guaiFENesin  1,200 mg Oral BID  . insulin aspart  0-15 Units Subcutaneous TID WC  . insulin aspart  3 Units Subcutaneous TID WC  . insulin glargine  12 Units Subcutaneous Daily  . ipratropium-albuterol  3 mL Nebulization Q6H  . loratadine  10 mg Oral Daily  . methylPREDNISolone (SOLU-MEDROL) injection  60 mg Intravenous Q12H  . pantoprazole  40 mg Oral Q0600  . traZODone  25 mg Oral QHS    Continuous Infusions: . ceFEPime (MAXIPIME) IV 1 g (08/05/18 0503)    PRN Meds: acetaminophen, albuterol, hydrALAZINE, HYDROcodone-acetaminophen, HYDROcodone-homatropine, loperamide, morphine injection, nitroGLYCERIN, ondansetron, polyethylene glycol  Physical Exam         Coarse breath sounds S 1 S 2  Abdomen not distended Has numbness in R foot Admits to pain in sacrum area No edema Non focal  Vital Signs: BP (!) 104/55 (BP Location: Right Arm)   Pulse 76   Temp 98.2 F (36.8 C) (Oral)   Resp 14   Ht 5\' 6"  (1.676 m)   Wt  63.5 kg   SpO2 99%   BMI 22.60 kg/m  SpO2: SpO2: 99 % O2 Device: O2 Device: Nasal Cannula O2 Flow Rate: O2 Flow Rate (L/min): 2 L/min  Intake/output summary:   Intake/Output Summary (Last 24 hours) at 08/05/2018 1019 Last data filed at 08/05/2018 0846 Gross per 24 hour  Intake 751.76 ml  Output 500 ml  Net 251.76 ml   LBM: Last BM Date: 08/03/18  Baseline Weight: Weight: 63.5 kg Most recent weight: Weight: 63.5 kg       Palliative Assessment/Data:    Flowsheet Rows     Most Recent Value  Intake Tab  Referral Department  Hospitalist  Unit at Time of Referral  Med/Surg Unit  Palliative Care Primary Diagnosis  Cancer  Palliative Care Type  Return patient Palliative Care  Reason for referral  Clarify Goals of Care  Date first seen by Palliative Care  08/04/18  Clinical Assessment  Palliative Performance Scale Score  30%  Pain Max last 24 hours  4  Pain Min Last 24 hours  3  Dyspnea Max Last 24 Hours  5  Dyspnea Min Last 24 hours  4  Psychosocial & Spiritual Assessment  Palliative Care Outcomes  Patient/Family meeting held?  Yes  Who was at the meeting?  HCPOA grand son Mr Dorann Ou over the phone.       Patient Active Problem List   Diagnosis Date Noted  . COPD (chronic obstructive pulmonary disease) ([place]) 08/04/2018  . HCAP (healthcare-associated pneumonia) 08/04/2018  . Elevated troponin 08/03/2018  . Macrocytic anemia 08/03/2018  . Hemoptysis 08/03/2018  . GERD (gastroesophageal reflux disease) 08/03/2018  . CAD (coronary artery disease) 08/03/2018  . Palliative care by specialist   . Generalized weakness   . Right lower lobe pneumonia ([place]) 05/16/2018  . Dehydration   . Diarrhea 04/20/2018  . Neutropenia ([place]) 04/20/2018  . Goals of care, counseling/discussion 04/06/2018  . Encounter for antineoplastic chemotherapy 04/06/2018  . Encounter for antineoplastic immunotherapy 04/06/2018  . Bone metastasis ([place]) 03/18/2018  . Metastatic lung cancer (metastasis  from lung to other site) ([place]) 03/16/2018  . Insomnia 10/05/2015  . Late effect of stroke 10/05/2015  . Slow transit constipation   . Nausea without vomiting   . Essential hypertension   . Anxiety state   . Cerebellar infarct ([employer]) 07/17/2015  . Cerebellar stroke ([place])   . Coronary artery disease involving native coronary artery of native heart without angina pectoris   . COPD exacerbation ([place])   . CKD (chronic kidney disease) stage 3, GFR 30-59 ml/min (HCC)   . Leukocytosis   . Thrombocytopenia ([place])   . Cerebrovascular accident (CVA) ([place])   . Stroke ([place]) 07/15/2015  . CVA (cerebral infarction) 07/15/2015  . Hypothermia 07/15/2015  . Atherosclerotic PVD with intermittent claudication ([place]) 12/29/2013  . Aftercare following surgery of the circulatory system, [place] 12/23/2012  . Atherosclerosis of native arteries of the extremities with intermittent claudication 06/17/2012  . Diabetes mellitus, type 2 ([place]) 06/11/2012  . Peripheral vascular disease, unspecified ([place]) 05/29/2011  . HLD (hyperlipidemia) 08/14/2009  . MYOCARDIAL INFARCTION 08/14/2009  . Atrial fibrillation ([place]) 08/14/2009  . TOBACCO ABUSE, HX OF 08/14/2009    Palliative Care Assessment & Plan   Patient Profile:  85 year old gentleman, known to palliative medicine service, seen by the undersigned in previous hospitalization, past medical history significant for chronic obstructive pulmonary disease is on 2 L oxygen at home, also with a history of metastatic non-small cell lung cancer under the care of Dr. Earlie Server, history of hypertension dyslipidemia diabetes stroke atrial fibrillation coronary artery disease GI bleeding and gastroesophageal reflux disease.  Patient has been admitted to hospital medicine service with weakness, shortness of breath, progressive functional decline, ongoing minimal oral intake, hemoptysis.   Assessment:  acute COPD exacerbation Probable health care associated PNA Metastatic lung  cancer Frailty Deconditioning Poor PO intake.  Cough Numbness in feet Constipation  Recommendations/Plan:   continue current  pain and non pain symptom management.  Patient will benefit from residential hospice towards the end of this hospitalization. Discussed with grand son HCPOA agent Mr Dorann Ou over the phone on 08-04-2018. Also discussed with the patient at the bedside today. He is in agreement. Patient has ongoing symptom burden, will need escalating opioids and benzodiazepines for comfort care, might have limited prognosis due to advanced malignancy, poor PO intake and ongoing decline in his functional status.   Will request CSW assistance for residential hospice.    Code Status:    Code Status Orders  (From admission, onward)         Start     Ordered   08/03/18 2143  Do not attempt resuscitation (DNR)  Continuous    Question Answer Comment  In the event of cardiac or respiratory ARREST Do not call a "code blue"   In the event of cardiac or respiratory ARREST Do not perform Intubation, CPR, defibrillation or ACLS   In the event of cardiac or respiratory ARREST Use medication by any route, position, wound care, and other measures to relive pain and suffering. May use oxygen, suction and manual treatment of airway obstruction as needed for comfort.      08/03/18 2144        Code Status History    Date Active Date Inactive Code Status Order ID Comments User Context   05/16/2018 1313 05/21/2018 2315 DNR [id number]  Karie Kirks, DO ED   04/20/2018 1654 04/24/2018 2032 DNR [id number]  Karmen Bongo, MD ED   03/16/2018 1207 03/19/2018 1856 Full Code [id number]  Karmen Bongo, MD ED   07/17/2015 1812 07/26/2015 1436 Full Code [id number]  Cathlyn Parsons, PA-C Inpatient   07/17/2015 1812 07/17/2015 1812 Full Code [id number]  Cathlyn Parsons, PA-C Inpatient   07/15/2015 1709 07/17/2015 1812 Full Code [id number]  Willia Craze, NP Inpatient       Prognosis:   < 2 weeks  Discharge  Planning:  Hospice facility  Care plan was discussed with  patient  Thank you for allowing the Palliative Medicine Team to assist in the care of this patient.   Time In: 9 Time Out: 9.35 Total Time 35 Prolonged Time Billed  no       Greater than 50%  of this time was spent counseling and coordinating care related to the above assessment and plan.  Loistine Chance, MD [phone] Please contact Palliative Medicine Team phone at [phone] for questions and concerns.

## 2018-08-05 NOTE — Evaluation (Signed)
Physical Therapy Evaluation Patient Details Name: Martin [name] MRN: [id number] DOB: 05/06/1930 Today's Date: 08/05/2018   History of Present Illness  85 y.o. male with medical history significant of COPD, on 2 L oxygen at home, hypertension, hyperlipidemia, DM, stroke, GERD, metastasized non-small cell lung cancer, GI bleeding, CAD, atrial fibrillation not on anticoagulants, who presents with shortness of breath.  Clinical Impression  Pt admitted with above diagnosis. Pt currently with functional limitations due to the deficits listed below (see PT Problem List). Min/mod assist for recliner to bedside commode transfer with RW, ambulation deferred 2* fatigue with transfer. Pt reports 2 falls just prior to admission. 24* assistance recommended due to high fall risk.  Pt will benefit from skilled PT to increase their independence and safety with mobility to allow discharge to the venue listed below.       Follow Up Recommendations SNF;Supervision for mobility/OOB;Supervision/Assistance - 24 hour    Equipment Recommendations  Rolling walker with 5" wheels;Wheelchair cushion (measurements PT);Wheelchair (measurements PT)    Recommendations for Other Services       Precautions / Restrictions Precautions Precautions: Fall Precaution Comments: pt reports 2 falls at home recently ("legs gave out") Restrictions Weight Bearing Restrictions: No      Mobility  Bed Mobility               General bed mobility comments: up in recliner  Transfers Overall transfer level: Needs assistance Equipment used: Rolling walker (2 wheeled) Transfers: Sit to/from [facility] Sit to Stand: Mod assist Stand pivot transfers: Min assist       General transfer comment: VCs hand placement, increased time, mod A to rise, SPT x 2 (recliner to 3 in 1 then to recliner) with RW, pt on 2L O2 during transfer  Ambulation/Gait             General Gait Details: deferred 2* fatigue with  transfers  Stairs            Wheelchair Mobility    Modified Rankin (Stroke Patients Only)       Balance Overall balance assessment: Needs assistance;History of Falls   Sitting balance-Leahy Scale: Fair     Standing balance support: Bilateral upper extremity supported Standing balance-Leahy Scale: Poor Standing balance comment: relies on BUE support                             Pertinent Vitals/Pain Pain Assessment: Faces Faces Pain Scale: Hurts even more Pain Location: neck, back Pain Descriptors / Indicators: Aching Pain Intervention(s): Limited activity within patient's tolerance;Monitored during session;Premedicated before session    Home Living Family/patient expects to be discharged to:: Skilled nursing facility                      Prior Function Level of Independence: Needs assistance   Gait / Transfers Assistance Needed: pt reports he was having trouble walking and had started to have some falls PTA, walked with RW           Hand Dominance        Extremity/Trunk Assessment   Upper Extremity Assessment Upper Extremity Assessment: Defer to OT evaluation    Lower Extremity Assessment Lower Extremity Assessment: RLE deficits/detail RLE Deficits / Details: ankle PF/DF AROM decreased 50%, R knee ext -3/5 RLE Sensation: decreased light touch    Cervical / Trunk Assessment Cervical / Trunk Assessment: Kyphotic  Communication   Communication: No difficulties  Cognition Arousal/Alertness: Awake/alert Behavior During Therapy: WFL for tasks assessed/performed Overall Cognitive Status: Within Functional Limits for tasks assessed                                        General Comments      Exercises     Assessment/Plan    PT Assessment Patient needs continued PT services  PT Problem List Decreased strength;Decreased activity tolerance;Decreased mobility;Decreased balance;Pain;Impaired sensation        PT Treatment Interventions Gait training;Functional mobility training;Therapeutic activities;Therapeutic exercise;Patient/family education    PT Goals (Current goals can be found in the Care Plan section)  Acute Rehab PT Goals Patient Stated Goal: decrease pain PT Goal Formulation: With patient Time For Goal Achievement: 08/19/18 Potential to Achieve Goals: Fair    Frequency Min 2X/week   Barriers to discharge        Co-evaluation               AM-PAC PT "6 Clicks" Mobility  Outcome Measure Help needed turning from your back to your side while in a flat bed without using bedrails?: A Little Help needed moving from lying on your back to sitting on the side of a flat bed without using bedrails?: A Little Help needed moving to and from a bed to a chair (including a wheelchair)?: A Little Help needed standing up from a chair using your arms (e.g., wheelchair or bedside chair)?: A Lot Help needed to walk in hospital room?: A Lot Help needed climbing 3-5 steps with a railing? : Total 6 Click Score: 14    End of Session Equipment Utilized During Treatment: Gait belt Activity Tolerance: Patient limited by fatigue Patient left: in chair;with call bell/phone within reach;with chair alarm set Nurse Communication: Mobility status PT Visit Diagnosis: Unsteadiness on feet (R26.81);Muscle weakness (generalized) (M62.81);History of falling (Z91.81);Pain;Difficulty in walking, not elsewhere classified (R26.2) Pain - Right/Left: Right Pain - part of body: Leg    Time: 1200-1226 PT Time Calculation (min) (ACUTE ONLY): 26 min   Charges:   PT Evaluation $PT Eval Low Complexity: 1 Low PT Treatments $Therapeutic Activity: 8-22 mins       Blondell Reveal Kistler PT 08/05/2018  Acute Rehabilitation Services Pager [phone] Office [phone]

## 2018-08-05 NOTE — Evaluation (Signed)
Occupational Therapy Evaluation Patient Details Name: Martin [name] MRN: [id number] DOB: 11/01/1930 Today's Date: 08/05/2018    History of Present Illness 85 y.o. male with medical history significant of COPD, on 2 L oxygen at home, hypertension, hyperlipidemia, DM, stroke, GERD, metastasized non-small cell lung cancer, GI bleeding, CAD, atrial fibrillation not on anticoagulants, who presents with shortness of breath.   Clinical Impression   Pt admitted with SOB. Pt currently with functional limitations due to the deficits listed below (see OT Problem List).  Pt will benefit from skilled OT to increase their safety and independence with ADL and functional mobility for ADL to facilitate discharge to venue listed below.      Follow Up Recommendations  SNF    Equipment Recommendations  None recommended by OT    Recommendations for Other Services       Precautions / Restrictions Precautions Precautions: Fall Precaution Comments: pt reports 2 falls at home recently ("legs gave out") Restrictions Weight Bearing Restrictions: No      Mobility Bed Mobility Overal bed mobility: Needs Assistance Bed Mobility: Supine to Sit     Supine to sit: Min assist     General bed mobility comments: up in recliner  Transfers Overall transfer level: Needs assistance Equipment used: Rolling walker (2 wheeled) Transfers: Sit to/from [facility] Sit to Stand: Mod assist Stand pivot transfers: Min assist       General transfer comment: VCs hand placement, increased time, mod A to rise, SPT x 2 (recliner to 3 in 1 then to recliner) with RW, pt on 2L O2 during transfer    Balance Overall balance assessment: Needs assistance;History of Falls   Sitting balance-Leahy Scale: Fair     Standing balance support: Bilateral upper extremity supported Standing balance-Leahy Scale: Poor Standing balance comment: relies on BUE support                           ADL  either performed or assessed with clinical judgement   ADL Overall ADL's : Needs assistance/impaired Eating/Feeding: Set up;Sitting   Grooming: Set up;Sitting   Upper Body Bathing: Set up;Sitting   Lower Body Bathing: Moderate assistance;Sit to/from stand;Cueing for sequencing;Cueing for safety   Upper Body Dressing : Minimal assistance;Sitting   Lower Body Dressing: Moderate assistance;Sit to/from stand;Cueing for sequencing;Cueing for safety   Toilet Transfer: Moderate assistance;BSC;Stand-pivot   Toileting- Clothing Manipulation and Hygiene: Moderate assistance;Sit to/from stand;Cueing for sequencing;Cueing for safety       Functional mobility during ADLs: Moderate assistance;Rolling walker       Vision Patient Visual Report: No change from baseline              Pertinent Vitals/Pain Pain Assessment: Faces Pain Score: 3  Faces Pain Scale: Hurts even more Pain Location: neck, back Pain Descriptors / Indicators: Aching Pain Intervention(s): Limited activity within patient's tolerance;Repositioned     Hand Dominance     Extremity/Trunk Assessment Upper Extremity Assessment Upper Extremity Assessment: Generalized weakness   Lower Extremity Assessment Lower Extremity Assessment: RLE deficits/detail RLE Deficits / Details: ankle PF/DF AROM decreased 50%, R knee ext -3/5 RLE Sensation: decreased light touch   Cervical / Trunk Assessment Cervical / Trunk Assessment: Kyphotic   Communication Communication Communication: No difficulties   Cognition Arousal/Alertness: Awake/alert Behavior During Therapy: WFL for tasks assessed/performed Overall Cognitive Status: Within Functional Limits for tasks assessed  Home Living Family/patient expects to be discharged to:: Skilled nursing facility                                        Prior Functioning/Environment Level of Independence:  Needs assistance  Gait / Transfers Assistance Needed: pt reports he was having trouble walking and had started to have some falls PTA, walked with RW              OT Problem List: Decreased strength;Decreased activity tolerance;Impaired balance (sitting and/or standing);Decreased safety awareness      OT Treatment/Interventions: Self-care/ADL training;Patient/family education;Therapeutic activities    OT Goals(Current goals can be found in the care plan section) Acute Rehab OT Goals Patient Stated Goal: decrease pain OT Goal Formulation: With patient Time For Goal Achievement: 08/19/18 Potential to Achieve Goals: Good  OT Frequency: Min 2X/week              AM-PAC OT "6 Clicks" Daily Activity     Outcome Measure Help from another person eating meals?: None Help from another person taking care of personal grooming?: None Help from another person toileting, which includes using toliet, bedpan, or urinal?: A Little Help from another person bathing (including washing, rinsing, drying)?: A Lot Help from another person to put on and taking off regular upper body clothing?: A Little Help from another person to put on and taking off regular lower body clothing?: A Lot 6 Click Score: 18   End of Session Equipment Utilized During Treatment: Rolling walker;Gait belt Nurse Communication: Mobility status  Activity Tolerance: Patient tolerated treatment well Patient left: in chair  OT Visit Diagnosis: Unsteadiness on feet (R26.81);Other abnormalities of gait and mobility (R26.89);History of falling (Z91.81);Muscle weakness (generalized) (M62.81)                Time: 9758-8325 OT Time Calculation (min): 17 min Charges:  OT General Charges $OT Visit: 1 Visit OT Evaluation $OT Eval Moderate Complexity: 1 Mod  Kari Baars, [place] [phone] Office- [phone]     Eliyanah Elgersma, Edwena Felty D 08/05/2018, 1:36 PM

## 2018-08-05 NOTE — Progress Notes (Signed)
PROGRESS NOTE    Martin [name]  VZD:[id number] DOB: Sep 26, 1930 DOA: 08/03/2018 PCP: Susy Frizzle, MD    Brief Narrative:  HPI per Dr. Graciella Freer Martin [name] is a 85 y.o. male with medical history significant of COPD, on 2 L oxygen at home, hypertension, hyperlipidemia, DM, stroke, GERD, metastasized non-small cell lung cancer, GI bleeding, CAD, atrial fibrillation not on anticoagulants, who presents with shortness of breath.  Patient states that he has been having shortness of breath in the past 3 days, which has been progressively worsening.  He has cough with streaks of dark red blood production.  Denies fever or chills.  No chest pain.  Patient uses 2 L oxygen at home, currently his saturation is 96% on 2 L oxygen in ED.  Patient denies nausea, vomiting, diarrhea, abdominal pain.  No symptoms of UTI. Pt states that he is living alone at home.  ED Course: pt was found to have WBC 16.3, troponin 0.03, lactic acid of 1.7, BNP 259.9, negative COVID-19 test, electrolytes renal function okay, temperature normal, soft blood pressure, heart rate 8290s, RR 18-21.  Patient is placed on telemetry bed for observation.  CXR showed: 1. Prominent enlargement of the pleural-based/chest wall mass along the left upper chest with lytic destruction of the left fourth and part of the fifth rib. Rapid growth since 05/19/2018. 2. Enlarging right apical pulmonary nodule compatible with malignancy. 3. New retrocardiac nodularity measuring 2.8 by 2.0 cm, possibly malignancy or focal pneumonia. 4. Aortic Atherosclerosis (ICD10-I70.0).  Assessment & Plan:   Principal Problem:   COPD exacerbation ([place]) Active Problems:   HCAP (healthcare-associated pneumonia)   HLD (hyperlipidemia)   Diabetes mellitus, type 2 ([place])   Stroke [facility])   Essential hypertension   Anxiety state   Metastatic lung cancer (metastasis from lung to other site) ([place])   Elevated troponin   Macrocytic anemia   Hemoptysis   GERD  (gastroesophageal reflux disease)   CAD (coronary artery disease)   COPD (chronic obstructive pulmonary disease) ([facility])   Shortness of breath   1 acute COPD exacerbation with chronic respiratory failure with hypoxia Patient presented with worsening shortness of breath, productive cough and noted to be on 2 L nasal cannula on home.  Patient not noted to have any increased O2 requirements however with complaints of shortness of breath.  Chest x-ray done showing retrocardiac nodularity possibly malignancy or focal pneumonia.  Patient noted to have a leukocytosis on admission with a white count of 16.3 however was afebrile.  Leukocytosis trending down.  Patient recently hospitalized.  Patient noted to have rhonchi on auscultation.  Patient with some reports of hemoptysis.  Patient denies any acute chest pain and no increased O2 requirements and as such low suspicion for PE.  Change IV Solu-Medrol to 40 mg IV every 12 hours.  Continue Pulmicort and Brovana and scheduled duo nebs, Claritin, Flonase, PPI.  Continue azithromycin.  Due to concerns for possible pneumonia and metastatic disease patient likely immunocompromised and as such patient started on IV cefepime.  Palliative care following.   2.  Probable healthcare associated pneumonia Patient with underlying metastatic lung cancer presenting worsening shortness of breath, productive cough, poor air movement on 2 L nasal cannula.  Chest x-ray done concerning for retrocardiac nodularity possibly malignancy versus focal pneumonia.  Patient recently hospitalized.  Patient immunocompromised.  Sputum Gram stain and cultures pending.  Respiratory viral panel negative.  Blood cultures with no growth to date.  Urine strep pneumococcus antigen is negative.  Urine Legionella antigen pending.  Continue azithromycin and IV cefepime.  Continue duo nebs.  Supportive care.   3.  Metastatic lung cancer with mets from the lung to other site/failure to thrive Patient with  stage IV non-small cell carcinoma noted to present with right upper lobe lung mass in addition to metastatic disease to the left fourth rib as well as right sacral area and pleural-based nodularity.  Patient status post palliative radiotherapy to the right sacral metastatic bone lesion under the care of Dr. Tammi Klippel.  Patient was on chemotherapy which was stopped a month ago.  Patient with poor oral intake.  Palliative care consulted and patient likely when medically stable to be discharged home with hospice versus residential hospice home.  Appreciate palliative care input and recommendations.  4.  Hyperlipidemia Continue statin.  5.  Diet-controlled diabetes type 2 mellitus without complications Hemoglobin A1c was 6.7 on 03/30/2018.  Patient noted to have elevated CBGs likely secondary to IV steroids.  CBG noted to be 295 this morning.  Increase Lantus to 12 units Martin.  Placed on NovoLog 3 units meal coverage.  Continue moderate scale sliding scale insulin.  Follow with steroid taper.   6.  History of CVA Plavix on hold due to complaints of hemoptysis.  Continue Lipitor.  7.  Hypertension  Stable. IV hydralazine as needed.  8.  Anxiety Continue Xanax.  9.  History of coronary artery disease and elevated troponin Patient denies any acute chest pain.  Patient did have some chest discomfort.  Cardiac enzymes were cycled and negative x3.  Plavix on hold due to hemoptysis.  Continue Lipitor.  LDL of 22.  10.  Macrocytic anemia Hemoglobin stable at 9.2.  11.  Hemoptysis Likely secondary to metastatic lung cancer.  Continue to hold Plavix.   12.  Gastroesophageal reflux disease Continue Pepcid.  13.  Anemia of chronic disease Patient denies any overt bleeding.  Hemoglobin is 7.5 from 9.2.  Anemia panel consistent with anemia of chronic disease.  Follow H&H.  Transfusion threshold hemoglobin less than 7.   DVT prophylaxis: SCDs Code Status: DNR Family Communication: Updated patient.  No  family at bedside. Disposition Plan: Home with Hospice versus residential hospice home when medically stable.   Consultants:   Palliative care: Dr. Rowe Pavy 08/04/2018  Procedures:  Chest x-ray 08/03/2018    Antimicrobials:   Azithromycin 08/03/2018  IV cefepime 08/04/2018   Subjective: Patient complained of pain in the sacrum.  Patient states some improvement with his shortness of breath on presentation however not at baseline.  Denies any chest pain.  Poor oral intake.  Patient states decreased appetite.  Patient denies any hemoptysis today.  Objective: Vitals:   08/04/18 2038 08/05/18 0236 08/05/18 0510 08/05/18 0851  BP: (!) 113/52  (!) 104/55   Pulse: 92  76   Resp: 20  14   Temp: 98.2 F (36.8 C)  98.2 F (36.8 C)   TempSrc: Oral  Oral   SpO2: 98% 98% 98% 99%  Weight:      Height:        Intake/Output Summary (Last 24 hours) at 08/05/2018 1050 Last data filed at 08/05/2018 0846 Gross per 24 hour  Intake 751.76 ml  Output 500 ml  Net 251.76 ml   Filed Weights   08/03/18 1814  Weight: 63.5 kg    Examination:  General exam: NAD.  Dry mucous membranes. Respiratory system: Poor -fair air movement.  Some scattered coarse breath sounds.  No crackles.  Speaking in  full sentences. Respiratory effort normal. Cardiovascular system: Regular rate rhythm no murmurs rubs or gallops.  No JVD.  No lower extremity edema.  Gastrointestinal system: Abdomen is soft, nontender, nondistended, positive bowel sounds.  No rebound.  No guarding.  Central nervous system: Alert and oriented. No focal neurological deficits. Extremities: Symmetric 5 x 5 power. Skin: No rashes, lesions or ulcers Psychiatry: Judgement and insight appear normal. Mood & affect appropriate.     Data Reviewed: I have personally reviewed following labs and imaging studies  CBC: Recent Labs  Lab 08/03/18 1842 08/04/18 1003 08/05/18 0543  WBC 16.3* 18.7* 14.4*  NEUTROABS 15.3* 17.9* 13.6*  HGB 9.5*  9.2* 7.5*  HCT 31.4* 29.5* 24.5*  MCV 104.7* 105.7* 105.2*  PLT 188 192 622   Basic Metabolic Panel: Recent Labs  Lab 08/03/18 1842 08/04/18 0414 08/05/18 0543  NA 134* 137 133*  K 3.7 3.6 3.7  CL 95* 101 102  CO2 28 26 24   GLUCOSE 494* 383* 316*  BUN 21 20 26*  CREATININE 0.98 0.91 0.92  CALCIUM 8.0* 7.8* 7.4*  MG  --  1.7 2.2   GFR: Estimated Creatinine Clearance: 52.7 mL/min (by C-G formula based on SCr of 0.92 mg/dL). Liver Function Tests: Recent Labs  Lab 08/03/18 1842  AST 12*  ALT 11  ALKPHOS 118  BILITOT 0.7  PROT 5.6*  ALBUMIN 2.2*   No results for input(s): LIPASE, AMYLASE in the last 168 hours. No results for input(s): AMMONIA in the last 168 hours. Coagulation Profile: No results for input(s): INR, PROTIME in the last 168 hours. Cardiac Enzymes: Recent Labs  Lab 08/03/18 1842 08/03/18 2319 08/04/18 0414 08/04/18 1003  TROPONINI 0.03* <0.03 <0.03 <0.03   BNP (last 3 results) No results for input(s): PROBNP in the last 8760 hours. HbA1C: Recent Labs    08/04/18 0414  HGBA1C 10.6*   CBG: Recent Labs  Lab 08/04/18 0732 08/04/18 1142 08/04/18 1701 08/04/18 2039 08/05/18 0815  GLUCAP 337* 435* 423* 436* 295*   Lipid Profile: Recent Labs    08/04/18 0414  CHOL 83  HDL 33*  LDLCALC 22  TRIG 140  CHOLHDL 2.5   Thyroid Function Tests: No results for input(s): TSH, T4TOTAL, FREET4, T3FREE, THYROIDAB in the last 72 hours. Anemia Panel: Recent Labs    08/05/18 0543  VITAMINB12 427  FOLATE 6.5  FERRITIN 1,381*  TIBC 107*  IRON 22*   Sepsis Labs: Recent Labs  Lab 08/03/18 1843 08/03/18 2213  LATICACIDVEN 1.7 1.0    Recent Results (from the past 240 hour(s))  SARS Coronavirus 2 (CEPHEID - Performed in [place] hospital lab), Hosp Order     Status: None   Collection Time: 08/03/18  6:33 PM  Result Value Ref Range Status   SARS Coronavirus 2 NEGATIVE NEGATIVE Final    Comment: (NOTE) If result is NEGATIVE SARS-CoV-2  target nucleic acids are NOT DETECTED. The SARS-CoV-2 RNA is generally detectable in upper and lower  respiratory specimens during the acute phase of infection. The lowest  concentration of SARS-CoV-2 viral copies this assay can detect is 250  copies / mL. A negative result does not preclude SARS-CoV-2 infection  and should not be used as the sole basis for treatment or other  patient management decisions.  A negative result may occur with  improper specimen collection / handling, submission of specimen other  than nasopharyngeal swab, presence of viral mutation(s) within the  areas targeted by this assay, and inadequate number of viral copies  (<  250 copies / mL). A negative result must be combined with clinical  observations, patient history, and epidemiological information. If result is POSITIVE SARS-CoV-2 target nucleic acids are DETECTED. The SARS-CoV-2 RNA is generally detectable in upper and lower  respiratory specimens dur ing the acute phase of infection.  Positive  results are indicative of active infection with SARS-CoV-2.  Clinical  correlation with patient history and other diagnostic information is  necessary to determine patient infection status.  Positive results do  not rule out bacterial infection or co-infection with other viruses. If result is PRESUMPTIVE POSTIVE SARS-CoV-2 nucleic acids MAY BE PRESENT.   A presumptive positive result was obtained on the submitted specimen  and confirmed on repeat testing.  While 2019 novel coronavirus  (SARS-CoV-2) nucleic acids may be present in the submitted sample  additional confirmatory testing may be necessary for epidemiological  and / or clinical management purposes  to differentiate between  SARS-CoV-2 and other Sarbecovirus currently known to infect humans.  If clinically indicated additional testing with an alternate test  methodology [phone]) is advised. The SARS-CoV-2 RNA is generally  detectable in upper and lower  respiratory sp ecimens during the acute  phase of infection. The expected result is Negative. Fact Sheet for Patients:  StrictlyIdeas.no Fact Sheet for Healthcare Providers: BankingDealers.co.za This test is not yet approved or cleared by the [place] FDA and has been authorized for detection and/or diagnosis of SARS-CoV-2 by FDA under an Emergency Use Authorization (EUA).  This EUA will remain in effect (meaning this test can be used) for the duration of the COVID-19 declaration under Section 564(b)(1) of the Act, 21 U.S.C. section 360bbb-3(b)(1), unless the authorization is terminated or revoked sooner. Performed at [facility], [place] [address], [place], [place] [address]   Blood culture (routine x 2)     Status: None (Preliminary result)   Collection Time: 08/03/18  6:43 PM  Result Value Ref Range Status   Specimen Description   Final    BLOOD LEFT ANTECUBITAL Performed at [facility] [address] [place], [place] [address]    Special Requests   Final    BOTTLES DRAWN AEROBIC AND ANAEROBIC Blood Culture adequate volume Performed at [facility] [address], [place], [place] [address]    Culture   Final    NO GROWTH < 12 HOURS Performed at [facility] [address], [place], [place] [address]    Report Status PENDING  Incomplete  Blood culture (routine x 2)     Status: None (Preliminary result)   Collection Time: 08/03/18  6:52 PM  Result Value Ref Range Status   Specimen Description   Final    BLOOD BLOOD LEFT FOREARM Performed at [facility] [address], [place], [place] [address]    Special Requests   Final    BOTTLES DRAWN AEROBIC ONLY Blood Culture results may not be optimal due to an inadequate volume of blood received in culture bottles Performed at [facility] [address], [place], [place]  [address]    Culture   Final    NO GROWTH < 12 HOURS Performed at [facility] [address], [place], [place] [address]    Report Status PENDING  Incomplete  Respiratory Panel by PCR     Status: None   Collection Time: 08/03/18 10:13 PM  Result Value Ref Range Status   Adenovirus NOT DETECTED NOT DETECTED Final  Coronavirus 229E NOT DETECTED NOT DETECTED Final    Comment: (NOTE) The Coronavirus on the Respiratory Panel, DOES NOT test for the novel  Coronavirus (2019 nCoV)    Coronavirus HKU1 NOT DETECTED NOT DETECTED Final   Coronavirus NL63 NOT DETECTED NOT DETECTED Final   Coronavirus OC43 NOT DETECTED NOT DETECTED Final   Metapneumovirus NOT DETECTED NOT DETECTED Final   Rhinovirus / Enterovirus NOT DETECTED NOT DETECTED Final   Influenza A NOT DETECTED NOT DETECTED Final   Influenza B NOT DETECTED NOT DETECTED Final   Parainfluenza Virus 1 NOT DETECTED NOT DETECTED Final   Parainfluenza Virus 2 NOT DETECTED NOT DETECTED Final   Parainfluenza Virus 3 NOT DETECTED NOT DETECTED Final   Parainfluenza Virus 4 NOT DETECTED NOT DETECTED Final   Respiratory Syncytial Virus NOT DETECTED NOT DETECTED Final   Bordetella pertussis NOT DETECTED NOT DETECTED Final   Chlamydophila pneumoniae NOT DETECTED NOT DETECTED Final   Mycoplasma pneumoniae NOT DETECTED NOT DETECTED Final    Comment: Performed at [facility], [place] [address] [place], [place] [address]         Radiology Studies: Dg Chest Port 1 View  Result Date: 08/03/2018 CLINICAL DATA:  Lung cancer. Shortness of breath. Discontinued chemotherapy 1 month ago. EXAM: PORTABLE CHEST 1 VIEW COMPARISON:  05/19/2018 FINDINGS: Although the right basilar airspace opacity has cleared, there has been rapid growth of the left upper thoracic pleural-based tumor, with increase lobularity and considerable increase in size from 05/19/2018. Lytic absence of portions of the fourth and fifth ribs. The mass is currently about 16.2 by  5.3 cm, previously 7.0 by 3.7 cm. Atherosclerotic calcification of the aortic arch. There is evidence of chronic rotator cuff tear on the right with prominent degenerative glenohumeral arthropathy. Nodularity at the right lung apex compatible with enlarging right apical tumor, currently 2.7 by 2.2 cm. New retrocardiac density, possibly a new left lower lobe nodule, 2.8 by 2.0 cm. IMPRESSION: 1. Prominent enlargement of the pleural-based/chest wall mass along the left upper chest with lytic destruction of the left fourth and part of the fifth rib. Rapid growth since 05/19/2018. 2. Enlarging right apical pulmonary nodule compatible with malignancy. 3. New retrocardiac nodularity measuring 2.8 by 2.0 cm, possibly malignancy or focal pneumonia. 4.  Aortic Atherosclerosis (ICD10-I70.0). Electronically Signed   By: Van Clines M.D.   On: 08/03/2018 18:52        Scheduled Meds:  ALPRAZolam  0.5 mg Oral QHS   arformoterol  15 mcg Nebulization BID   atorvastatin  20 mg Oral q1800   azithromycin  250 mg Oral QHS   budesonide (PULMICORT) nebulizer solution  0.5 mg Nebulization BID   famotidine  20 mg Oral BID   fluticasone  2 spray Each Nare Martin   guaiFENesin  1,200 mg Oral BID   insulin aspart  0-15 Units Subcutaneous TID WC   insulin aspart  3 Units Subcutaneous TID WC   insulin glargine  12 Units Subcutaneous Martin   ipratropium-albuterol  3 mL Nebulization Q6H   loratadine  10 mg Oral Martin   methylPREDNISolone (SOLU-MEDROL) injection  60 mg Intravenous Q12H   pantoprazole  40 mg Oral Q0600   traZODone  25 mg Oral QHS   Continuous Infusions:  ceFEPime (MAXIPIME) IV 1 g (08/05/18 0503)     LOS: 1 day    Time spent: 35 minutes    Irine Seal, MD [facility]  If 7PM-7AM, please contact night-coverage [email or web address] 08/05/2018, 10:50 AM

## 2018-08-06 LAB — CBC WITH DIFFERENTIAL/PLATELET
Abs Immature Granulocytes: 0.12 10*3/uL — ABNORMAL HIGH (ref 0.00–0.07)
Basophils Absolute: 0 10*3/uL (ref 0.0–0.1)
Basophils Relative: 0 %
Eosinophils Absolute: 0 10*3/uL (ref 0.0–0.5)
Eosinophils Relative: 0 %
HCT: 26.2 % — ABNORMAL LOW (ref 39.0–52.0)
Hemoglobin: 7.8 g/dL — ABNORMAL LOW (ref 13.0–17.0)
Immature Granulocytes: 1 %
Lymphocytes Relative: 2 %
Lymphs Abs: 0.3 10*3/uL — ABNORMAL LOW (ref 0.7–4.0)
MCH: 31.8 pg (ref 26.0–34.0)
MCHC: 29.8 g/dL — ABNORMAL LOW (ref 30.0–36.0)
MCV: 106.9 fL — ABNORMAL HIGH (ref 80.0–100.0)
Monocytes Absolute: 0.4 10*3/uL (ref 0.1–1.0)
Monocytes Relative: 3 %
Neutro Abs: 14.2 10*3/uL — ABNORMAL HIGH (ref 1.7–7.7)
Neutrophils Relative %: 94 %
Platelets: 149 10*3/uL — ABNORMAL LOW (ref 150–400)
RBC: 2.45 MIL/uL — ABNORMAL LOW (ref 4.22–5.81)
RDW: 14.5 % (ref 11.5–15.5)
WBC: 15.1 10*3/uL — ABNORMAL HIGH (ref 4.0–10.5)
nRBC: 0 % (ref 0.0–0.2)

## 2018-08-06 LAB — BASIC METABOLIC PANEL
Anion gap: 6 (ref 5–15)
BUN: 27 mg/dL — ABNORMAL HIGH (ref 8–23)
CO2: 25 mmol/L (ref 22–32)
Calcium: 7.5 mg/dL — ABNORMAL LOW (ref 8.9–10.3)
Chloride: 103 mmol/L (ref 98–111)
Creatinine, Ser: 0.94 mg/dL (ref 0.61–1.24)
GFR calc Af Amer: >60 mL/min (ref >60)
GFR calc non Af Amer: >60 mL/min (ref >60)
Glucose, Bld: 204 mg/dL — ABNORMAL HIGH (ref 70–99)
Potassium: 4.1 mmol/L (ref 3.5–5.1)
Sodium: 134 mmol/L — ABNORMAL LOW (ref 135–145)

## 2018-08-06 LAB — LEGIONELLA PNEUMOPHILA SEROGP 1 UR AG: L. pneumophila Serogp 1 Ur Ag: NEGATIVE

## 2018-08-06 LAB — GLUCOSE, CAPILLARY
Glucose-Capillary: 185 mg/dL — ABNORMAL HIGH (ref 70–99)
Glucose-Capillary: 245 mg/dL — ABNORMAL HIGH (ref 70–99)
Glucose-Capillary: 303 mg/dL — ABNORMAL HIGH (ref 70–99)
Glucose-Capillary: 287 mg/dL — ABNORMAL HIGH (ref 70–99)

## 2018-08-06 NOTE — Progress Notes (Signed)
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  Pt will not be ready to transfer for a day or two to allow him to finish IV abx.    Spoke with Delfino Lovett, his [facility] and medical POA, he says he is waiting to hear from MD about recommendations, but if Mr. Tassinari is ok with this, he wants to honor his wishes.  [place] will continue to follow through the weekend and anticipate possible transfer to [facility] if eligible.  Thank you, Venia Carbon RN, BSN CCRN [facility] Liaison (in [place] under Hospice and [place]. [facility] [address]) [phone]

## 2018-08-06 NOTE — Progress Notes (Signed)
Daily Progress Note   Patient Name: Martin [name]       Date: 08/06/2018 DOB: 07-20-1930  Age: 85 y.o. MRN#: [id number] Attending Physician: Eugenie Filler, MD Primary Care Physician: Susy Frizzle, MD Admit Date: 08/03/2018  Reason for Consultation/Follow-up: Establishing goals of care  Subjective:  patient appears weak, resting in chair. Still with some cough, no hemoptysis noted. Oral intake minimal.  PPS 30%   Length of Stay: 2  Current Medications: Scheduled Meds:  . ALPRAZolam  0.5 mg Oral QHS  . arformoterol  15 mcg Nebulization BID  . atorvastatin  20 mg Oral q1800  . azithromycin  250 mg Oral QHS  . budesonide (PULMICORT) nebulizer solution  0.5 mg Nebulization BID  . famotidine  20 mg Oral BID  . fluticasone  2 spray Each Nare Daily  . guaiFENesin  1,200 mg Oral BID  . insulin aspart  0-15 Units Subcutaneous TID WC  . insulin aspart  3 Units Subcutaneous TID WC  . insulin glargine  12 Units Subcutaneous Daily  . ipratropium-albuterol  3 mL Nebulization Q6H  . loratadine  10 mg Oral Daily  . methylPREDNISolone (SOLU-MEDROL) injection  40 mg Intravenous Q12H  . pantoprazole  40 mg Oral Q0600  . traZODone  25 mg Oral QHS    Continuous Infusions: . ceFEPime (MAXIPIME) IV Stopped (08/06/18 0615)    PRN Meds: acetaminophen, albuterol, hydrALAZINE, HYDROcodone-acetaminophen, HYDROcodone-homatropine, loperamide, morphine injection, nitroGLYCERIN, ondansetron, polyethylene glycol  Physical Exam         Coarse breath sounds S 1 S 2  Abdomen not distended Has pain in foot Admits to pain in sacrum area No edema Non focal  Vital Signs: BP (!) 119/54 (BP Location: Left Arm)   Pulse 91   Temp 98.1 F (36.7 C) (Oral)   Resp 18   Ht 5\' 6"  (1.676 m)   Wt  63.5 kg   SpO2 95%   BMI 22.60 kg/m  SpO2: SpO2: 95 % O2 Device: O2 Device: Nasal Cannula O2 Flow Rate: O2 Flow Rate (L/min): 2 L/min  Intake/output summary:   Intake/Output Summary (Last 24 hours) at 08/06/2018 1239 Last data filed at 08/06/2018 1000 Gross per 24 hour  Intake 1320.06 ml  Output 520 ml  Net 800.06 ml   LBM: Last BM Date: 08/03/18 Baseline Weight:  Weight: 63.5 kg Most recent weight: Weight: 63.5 kg       Palliative Assessment/Data:    Flowsheet Rows     Most Recent Value  Intake Tab  Referral Department  Hospitalist  Unit at Time of Referral  Med/Surg Unit  Palliative Care Primary Diagnosis  Cancer  Date Notified  08/04/18  Palliative Care Type  Return patient Palliative Care  Reason for referral  Clarify Goals of Care  Date of Admission  08/03/18  Date first seen by Palliative Care  08/04/18  # of days Palliative referral response time  0 Day(s)  # of days IP prior to Palliative referral  1  Clinical Assessment  Palliative Performance Scale Score  30%  Pain Max last 24 hours  4  Pain Min Last 24 hours  3  Dyspnea Max Last 24 Hours  5  Dyspnea Min Last 24 hours  4  Psychosocial & Spiritual Assessment  Palliative Care Outcomes  Patient/Family meeting held?  Yes  Who was at the meeting?  HCPOA grand son Mr Dorann Ou over the phone.       Patient Active Problem List   Diagnosis Date Noted  . Shortness of breath   . COPD (chronic obstructive pulmonary disease) ([place]) 08/04/2018  . HCAP (healthcare-associated pneumonia) 08/04/2018  . Elevated troponin 08/03/2018  . Macrocytic anemia 08/03/2018  . Hemoptysis 08/03/2018  . GERD (gastroesophageal reflux disease) 08/03/2018  . CAD (coronary artery disease) 08/03/2018  . Palliative care by specialist   . Generalized weakness   . Right lower lobe pneumonia ([place]) 05/16/2018  . Dehydration   . Diarrhea 04/20/2018  . Neutropenia ([place]) 04/20/2018  . Goals of care, counseling/discussion 04/06/2018  .  Encounter for antineoplastic chemotherapy 04/06/2018  . Encounter for antineoplastic immunotherapy 04/06/2018  . Bone metastasis ([place]) 03/18/2018  . Metastatic lung cancer (metastasis from lung to other site) ([place]) 03/16/2018  . Insomnia 10/05/2015  . Late effect of stroke 10/05/2015  . Slow transit constipation   . Nausea without vomiting   . Essential hypertension   . Anxiety state   . Cerebellar infarct ([place]) 07/17/2015  . Cerebellar stroke ([place])   . Coronary artery disease involving native coronary artery of native heart without angina pectoris   . COPD exacerbation ([place])   . CKD (chronic kidney disease) stage 3, GFR 30-59 ml/min (HCC)   . Leukocytosis   . Thrombocytopenia ([place])   . Cerebrovascular accident (CVA) ([place])   . Stroke ([place]) 07/15/2015  . CVA (cerebral infarction) 07/15/2015  . Hypothermia 07/15/2015  . Atherosclerotic PVD with intermittent claudication ([place]) 12/29/2013  . Aftercare following surgery of the circulatory system, [place] 12/23/2012  . Atherosclerosis of native arteries of the extremities with intermittent claudication 06/17/2012  . Diabetes mellitus, type 2 ([place]) 06/11/2012  . Peripheral vascular disease, unspecified ([place]) 05/29/2011  . HLD (hyperlipidemia) 08/14/2009  . MYOCARDIAL INFARCTION 08/14/2009  . Atrial fibrillation ([place]) 08/14/2009  . TOBACCO ABUSE, HX OF 08/14/2009    Palliative Care Assessment & Plan   Patient Profile:  85 year old gentleman, known to palliative medicine service, seen by the undersigned in previous hospitalization, past medical history significant for chronic obstructive pulmonary disease is on 2 L oxygen at home, also with a history of metastatic non-small cell lung cancer under the care of Dr. Earlie Server, history of hypertension dyslipidemia diabetes stroke atrial fibrillation coronary artery disease GI bleeding and gastroesophageal reflux disease.  Patient has been admitted to hospital medicine service with weakness,  shortness of breath, progressive functional decline,  ongoing minimal oral intake, hemoptysis.   Assessment:  acute COPD exacerbation Probable health care associated PNA Metastatic lung cancer Frailty Deconditioning Poor PO intake.  Cough Numbness in feet Constipation  Recommendations/Plan:   continue current pain and non pain symptom management.  Patient will benefit from residential hospice towards the end of this hospitalization. Discussed with grand son HCPOA agent Mr Dorann Ou over the phone on 08-04-2018. Also discussed with the patient at the bedside today. He is in agreement. Patient has ongoing symptom burden, will need escalating opioids and benzodiazepines for comfort care, might have limited prognosis due to advanced malignancy, poor PO intake and ongoing decline in his functional status.   Will request CSW assistance for residential hospice.   discussed with RN case manager and TRH MD about disposition today.  Code Status:    Code Status Orders  (From admission, onward)         Start     Ordered   08/03/18 2143  Do not attempt resuscitation (DNR)  Continuous    Question Answer Comment  In the event of cardiac or respiratory ARREST Do not call a "code blue"   In the event of cardiac or respiratory ARREST Do not perform Intubation, CPR, defibrillation or ACLS   In the event of cardiac or respiratory ARREST Use medication by any route, position, wound care, and other measures to relive pain and suffering. May use oxygen, suction and manual treatment of airway obstruction as needed for comfort.      08/03/18 2144        Code Status History    Date Active Date Inactive Code Status Order ID Comments User Context   05/16/2018 1313 05/21/2018 2315 DNR [id number]  Karie Kirks, DO ED   04/20/2018 1654 04/24/2018 2032 DNR [id number]  Karmen Bongo, MD ED   03/16/2018 1207 03/19/2018 1856 Full Code [id number]  Karmen Bongo, MD ED   07/17/2015 1812 07/26/2015 1436 Full Code [id number]   Cathlyn Parsons, PA-C Inpatient   07/17/2015 1812 07/17/2015 1812 Full Code [id number]  Cathlyn Parsons, PA-C Inpatient   07/15/2015 1709 07/17/2015 1812 Full Code [id number]  Willia Craze, NP Inpatient       Prognosis:   < 2 weeks  Discharge Planning:  Hospice facility  Care plan was discussed with  patient  Thank you for allowing the Palliative Medicine Team to assist in the care of this patient.   Time In: 9 Time Out: 9.25 Total Time 25 Prolonged Time Billed  no       Greater than 50%  of this time was spent counseling and coordinating care related to the above assessment and plan.  Loistine Chance, MD [phone] Please contact Palliative Medicine Team phone at [phone] for questions and concerns.

## 2018-08-06 NOTE — TOC Progression Note (Signed)
Transition of Care [facility]) - Progression Note    Patient Details  Name: Martin [name] MRN: [id number] Date of Birth: March 22, 1930  [facility] CM/SW Contact  Snigdha Howser, Juliann Pulse, RN Phone Number: 08/06/2018, 2:54 PM  Clinical Narrative:CM referral for residential hospice. TC per patient request Son Delfino Lovett [phone] has chosen Chief Executive Officer to [employer] & update CM if able to accept.         Barriers to Discharge: No Barriers Identified  Expected Discharge Plan and Services                                                 Social Determinants of Health (SDOH) Interventions    Readmission Risk Interventions Readmission Risk Prevention Plan 05/20/2018  Transportation Screening Complete  PCP or Specialist Appt within 3-5 Days (No Data)  [place] or [place] (No Data)  Social Work Consult for [place] Planning/Counseling [place] Screening Complete  Medication Review [employer]) Complete  Some recent data might be hidden

## 2018-08-06 NOTE — Care Management Important Message (Signed)
Important Message  Patient Details IM Letter given to Dessa Phi RN to present to the Patient Name: JERMOND BURKEMPER MRN: [id number] Date of Birth: 03-03-1931   Medicare Important Message Given:  Yes    Kerin Salen 08/06/2018, 11:43 AMImportant Message  Patient Details  Name: GERIK COBERLY MRN: [id number] Date of Birth: 1931-03-05   Medicare Important Message Given:  Yes    Kerin Salen 08/06/2018, 11:43 AM

## 2018-08-06 NOTE — Progress Notes (Signed)
Physical Therapy Treatment Patient Details Name: Martin [name] MRN: [id number] DOB: November 22, 1930 Today's Date: 08/06/2018    History of Present Illness 85 y.o. male with medical history significant of COPD, on 2 L oxygen at home, hypertension, hyperlipidemia, DM, stroke, GERD, metastasized non-small cell lung cancer, GI bleeding, CAD, atrial fibrillation not on anticoagulants, who presents with shortness of breath.    PT Comments    Mod assist for supine to sit, mod assist sit to stand, min assist to take several pivotal steps with RW to recliner. Ambulation deferred 2* fatigue with stand pivot transfer.   Follow Up Recommendations  SNF;Supervision for mobility/OOB;Supervision/Assistance - 24 hour     Equipment Recommendations  Rolling walker with 5" wheels;Wheelchair cushion (measurements PT);Wheelchair (measurements PT)    Recommendations for Other Services       Precautions / Restrictions Precautions Precautions: Fall Precaution Comments: pt reports 2 falls at home recently ("legs gave out") Restrictions Weight Bearing Restrictions: No    Mobility  Bed Mobility Overal bed mobility: Needs Assistance Bed Mobility: Supine to Sit     Supine to sit: Mod assist;HOB elevated     General bed mobility comments: assist to raise trunk  Transfers Overall transfer level: Needs assistance Equipment used: Rolling walker (2 wheeled) Transfers: Sit to/from [facility] Sit to Stand: Mod assist;From elevated surface Stand pivot transfers: Min assist       General transfer comment: VCs hand placement, increased time, mod A to rise, SPT with RW, pt on 2L O2 during transfer  Ambulation/Gait             General Gait Details: deferred 2* fatigue with transfers   Stairs             Wheelchair Mobility    Modified Rankin (Stroke Patients Only)       Balance Overall balance assessment: Needs assistance;History of Falls   Sitting balance-Leahy  Scale: Fair     Standing balance support: Bilateral upper extremity supported Standing balance-Leahy Scale: Poor Standing balance comment: relies on BUE support                            Cognition Arousal/Alertness: Awake/alert Behavior During Therapy: WFL for tasks assessed/performed Overall Cognitive Status: Within Functional Limits for tasks assessed                                        Exercises      General Comments        Pertinent Vitals/Pain Pain Score: 4  Pain Location: R posterolateral thigh and sacrum Pain Descriptors / Indicators: Sore Pain Intervention(s): Limited activity within patient's tolerance;Monitored during session;RN gave pain meds during session;Repositioned    Home Living                      Prior Function            PT Goals (current goals can now be found in the care plan section) Acute Rehab PT Goals Patient Stated Goal: decrease pain PT Goal Formulation: With patient Time For Goal Achievement: 08/19/18 Potential to Achieve Goals: Fair Progress towards PT goals: Progressing toward goals    Frequency    Min 2X/week      PT Plan Current plan remains appropriate    Co-evaluation  AM-PAC PT "6 Clicks" Mobility   Outcome Measure  Help needed turning from your back to your side while in a flat bed without using bedrails?: A Little Help needed moving from lying on your back to sitting on the side of a flat bed without using bedrails?: A Lot Help needed moving to and from a bed to a chair (including a wheelchair)?: A Lot Help needed standing up from a chair using your arms (e.g., wheelchair or bedside chair)?: A Lot Help needed to walk in hospital room?: A Lot Help needed climbing 3-5 steps with a railing? : Total 6 Click Score: 12    End of Session Equipment Utilized During Treatment: Gait belt; oxygen Activity Tolerance: Patient limited by fatigue Patient left: in  chair;with call bell/phone within reach;with chair alarm set;with nursing/sitter in room Nurse Communication: Mobility status PT Visit Diagnosis: Unsteadiness on feet (R26.81);Muscle weakness (generalized) (M62.81);History of falling (Z91.81);Pain;Difficulty in walking, not elsewhere classified (R26.2) Pain - Right/Left: Right Pain - part of body: Leg     Time: 1001-1019 PT Time Calculation (min) (ACUTE ONLY): 18 min  Charges:  $Therapeutic Activity: 8-22 mins                     Blondell Reveal Kistler PT 08/06/2018  Acute Rehabilitation Services Pager [phone] Office [phone]

## 2018-08-06 NOTE — Progress Notes (Signed)
PROGRESS NOTE    [name]  DUK:[id number] DOB: [birth year] DOA: 08/03/2018 PCP: Susy Frizzle, MD    Brief Narrative:  HPI per Dr. Graciella Freer Martin [name] is a 85 y.o. male with medical history significant of COPD, on 2 L oxygen at home, hypertension, hyperlipidemia, DM, stroke, GERD, metastasized non-small cell lung cancer, GI bleeding, CAD, atrial fibrillation not on anticoagulants, who presents with shortness of breath.  Patient states that he has been having shortness of breath in the past 3 days, which has been progressively worsening.  He has cough with streaks of dark red blood production.  Denies fever or chills.  No chest pain.  Patient uses 2 L oxygen at home, currently his saturation is 96% on 2 L oxygen in ED.  Patient denies nausea, vomiting, diarrhea, abdominal pain.  No symptoms of UTI. Pt states that he is living alone at home.  ED Course: pt was found to have WBC 16.3, troponin 0.03, lactic acid of 1.7, BNP 259.9, negative COVID-19 test, electrolytes renal function okay, temperature normal, soft blood pressure, heart rate 8290s, RR 18-21.  Patient is placed on telemetry bed for observation.  CXR showed: 1. Prominent enlargement of the pleural-based/chest wall mass along the left upper chest with lytic destruction of the left fourth and part of the fifth rib. Rapid growth since 05/19/2018. 2. Enlarging right apical pulmonary nodule compatible with malignancy. 3. New retrocardiac nodularity measuring 2.8 by 2.0 cm, possibly malignancy or focal pneumonia. 4. Aortic Atherosclerosis (ICD10-I70.0).  Assessment & Plan:   Principal Problem:   COPD exacerbation ([name]) Active Problems:   HCAP (healthcare-associated pneumonia)   HLD (hyperlipidemia)   Diabetes mellitus, type 2 ([name])   Stroke [name])   Essential hypertension   Anxiety state   Metastatic lung cancer (metastasis from lung to other site) ([name])   Elevated troponin   Macrocytic anemia   Hemoptysis   GERD  (gastroesophageal reflux disease)   CAD (coronary artery disease)   COPD (chronic obstructive pulmonary disease) ([facility])   Shortness of breath   1 acute COPD exacerbation with chronic respiratory failure with hypoxia Patient presented with worsening shortness of breath, productive cough and noted to be on 2 L nasal cannula on home.  Patient not noted to have any increased O2 requirements however with complaints of shortness of breath.  Chest x-ray done showing retrocardiac nodularity possibly malignancy or focal pneumonia.  Patient noted to have a leukocytosis on admission with a white count of 16.3 however was afebrile.  Leukocytosis somewhat fluctuating however patient also on IV steroids. Patient recently hospitalized.  Patient noted to have rhonchi on auscultation.  Patient with some reports of hemoptysis.  Patient denies any acute chest pain and no increased O2 requirements and as such low suspicion for PE.  Continue IV Solu-Medrol to 40 mg IV every 12 hours.  Continue Pulmicort and Brovana and scheduled duo nebs, Claritin, Flonase, PPI.  Continue antibiotics due to concerns for possible pneumonia and metastatic disease in patient likely immunocompromised and as such patient started on IV cefepime.  Palliative care following.   2.  Probable healthcare associated pneumonia Patient with underlying metastatic lung cancer presenting worsening shortness of breath, productive cough, poor air movement on 2 L nasal cannula.  Chest x-ray done concerning for retrocardiac nodularity possibly malignancy versus focal pneumonia.  Patient recently hospitalized.  Patient immunocompromised.  Sputum Gram stain and cultures pending.  Respiratory viral panel negative.  Blood cultures with no growth to date.  Urine strep  pneumococcus antigen is negative.  Urine Legionella antigen is negative.  Patient slowly improving clinically.  Continue IV cefepime and azithromycin and transition to oral antibiotics tomorrow 08/07/2018.   Continue duo nebs.  Supportive care.   3.  Metastatic lung cancer with mets from the lung to other site/failure to thrive Patient with stage IV non-small cell carcinoma noted to present with right upper lobe lung mass in addition to metastatic disease to the left fourth rib as well as right sacral area and pleural-based nodularity.  Patient status post palliative radiotherapy to the right sacral metastatic bone lesion under the care of Dr. Tammi Klippel.  Patient was on chemotherapy which was stopped a month ago.  Patient with poor oral intake.  Palliative care consulted and patient likely when medically stable to be discharged home with hospice versus residential hospice home.  Appreciate palliative care input and recommendations.  4.  Hyperlipidemia Continue statin.  5.  Diet-controlled diabetes type 2 mellitus without complications Hemoglobin A1c was 6.7 on 03/30/2018.  Patient noted to have elevated CBGs likely secondary to IV steroids.  CBG noted to be 185 this morning.  CBG was 88 at 9:35 PM on 08/05/2018.  Continue Lantus to 12 units daily, novoLog 3 units meal coverage.  Continue moderate scale sliding scale insulin.  Follow with steroid taper.   6.  History of CVA Plavix on hold due to complaints of hemoptysis.  Continue Lipitor.  7.  Hypertension  Stable. IV hydralazine as needed.  8.  Anxiety Continue Xanax.  9.  History of coronary artery disease and elevated troponin Patient denies any acute chest pain.  Patient did have some chest discomfort.  Cardiac enzymes were cycled and negative x3.  Plavix on hold due to hemoptysis.  Continue Lipitor.  LDL of 22.  10.  Macrocytic anemia Hemoglobin currently at 7.8 and currently stable.  Patient denies any overt bleeding.  Follow H&H.   11.  Hemoptysis Likely secondary to metastatic lung cancer.  Continue to hold Plavix.   12.  Gastroesophageal reflux disease Continue Pepcid.  13.  Anemia of chronic disease Patient denies any overt  bleeding.  Hemoglobin is 7.8 from 7.5 from 9.2.  Anemia panel consistent with anemia of chronic disease.  Follow H&H.  Transfusion threshold hemoglobin less than 7.   DVT prophylaxis: SCDs Code Status: DNR Family Communication: Updated patient.  No family at bedside. Disposition Plan: Home with Hospice versus residential hospice home when medically stable.   Consultants:   Palliative care: Dr. Rowe Pavy 08/04/2018  Procedures:  Chest x-ray 08/03/2018    Antimicrobials:   Azithromycin 08/03/2018  IV cefepime 08/04/2018   Subjective: Patient sitting up in chair complaining of sacral pain that has been ongoing for several weeks now.  Patient states some improvement with shortness of breath however not at baseline.  No chest pain.  Patient noted to have poor oral intake.  No further hemoptysis.   Objective: Vitals:   08/05/18 2140 08/06/18 0231 08/06/18 0621 08/06/18 0931  BP: (!) 105/59  (!) 119/54   Pulse: 93  91   Resp: 20  18   Temp: 98.1 F (36.7 C)  98.1 F (36.7 C)   TempSrc: Oral  Oral   SpO2: 97% 97% 90% 95%  Weight:      Height:        Intake/Output Summary (Last 24 hours) at 08/06/2018 1257 Last data filed at 08/06/2018 1000 Gross per 24 hour  Intake 1320.06 ml  Output 520 ml  Net 800.06  ml   Filed Weights   08/03/18 1814  Weight: 63.5 kg    Examination:  General exam: Dry mucous membranes. Respiratory system: Poor to fair air movement.  Some scattered coarse breath sounds.  No wheezing noted.  No crackles.  Speaking in full sentences.  Normal respiratory effort.   Cardiovascular system: RRR no murmurs rubs or gallops.  No JVD.  No lower extremity edema.  Gastrointestinal system: Abdomen is nontender, nondistended, soft, positive bowel sounds.  No rebound.  No guarding.   Central nervous system: Alert and oriented. No focal neurological deficits. Extremities: Symmetric 5 x 5 power. Skin: No rashes, lesions or ulcers Psychiatry: Judgement and insight  appear normal. Mood & affect appropriate.     Data Reviewed: I have personally reviewed following labs and imaging studies  CBC: Recent Labs  Lab 08/03/18 1842 08/04/18 1003 08/05/18 0543 08/05/18 1519 08/06/18 0552  WBC 16.3* 18.7* 14.4*  --  15.1*  NEUTROABS 15.3* 17.9* 13.6*  --  14.2*  HGB 9.5* 9.2* 7.5* 7.5* 7.8*  HCT 31.4* 29.5* 24.5* 25.2* 26.2*  MCV 104.7* 105.7* 105.2*  --  106.9*  PLT 188 192 157  --  607*   Basic Metabolic Panel: Recent Labs  Lab 08/03/18 1842 08/04/18 0414 08/05/18 0543 08/06/18 0552  NA 134* 137 133* 134*  K 3.7 3.6 3.7 4.1  CL 95* 101 102 103  CO2 28 26 24 25   GLUCOSE 494* 383* 316* 204*  BUN 21 20 26* 27*  CREATININE 0.98 0.91 0.92 0.94  CALCIUM 8.0* 7.8* 7.4* 7.5*  MG  --  1.7 2.2  --    GFR: Estimated Creatinine Clearance: 51.6 mL/min (by C-G formula based on SCr of 0.94 mg/dL). Liver Function Tests: Recent Labs  Lab 08/03/18 1842  AST 12*  ALT 11  ALKPHOS 118  BILITOT 0.7  PROT 5.6*  ALBUMIN 2.2*   No results for input(s): LIPASE, AMYLASE in the last 168 hours. No results for input(s): AMMONIA in the last 168 hours. Coagulation Profile: No results for input(s): INR, PROTIME in the last 168 hours. Cardiac Enzymes: Recent Labs  Lab 08/03/18 1842 08/03/18 2319 08/04/18 0414 08/04/18 1003  TROPONINI 0.03* <0.03 <0.03 <0.03   BNP (last 3 results) No results for input(s): PROBNP in the last 8760 hours. HbA1C: Recent Labs    08/04/18 0414  HGBA1C 10.6*   CBG: Recent Labs  Lab 08/05/18 1654 08/05/18 1856 08/05/18 2135 08/06/18 0756 08/06/18 1156  GLUCAP 349* 223* 88 185* 245*   Lipid Profile: Recent Labs    08/04/18 0414  CHOL 83  HDL 33*  LDLCALC 22  TRIG 140  CHOLHDL 2.5   Thyroid Function Tests: No results for input(s): TSH, T4TOTAL, FREET4, T3FREE, THYROIDAB in the last 72 hours. Anemia Panel: Recent Labs    08/05/18 0543  VITAMINB12 427  FOLATE 6.5  FERRITIN 1,381*  TIBC 107*  IRON  22*   Sepsis Labs: Recent Labs  Lab 08/03/18 1843 08/03/18 2213  LATICACIDVEN 1.7 1.0    Recent Results (from the past 240 hour(s))  SARS Coronavirus 2 (CEPHEID - Performed in [place] hospital lab), Hosp Order     Status: None   Collection Time: 08/03/18  6:33 PM  Result Value Ref Range Status   SARS Coronavirus 2 NEGATIVE NEGATIVE Final    Comment: (NOTE) If result is NEGATIVE SARS-CoV-2 target nucleic acids are NOT DETECTED. The SARS-CoV-2 RNA is generally detectable in upper and lower  respiratory specimens during the acute phase  of infection. The lowest  concentration of SARS-CoV-2 viral copies this assay can detect is 250  copies / mL. A negative result does not preclude SARS-CoV-2 infection  and should not be used as the sole basis for treatment or other  patient management decisions.  A negative result may occur with  improper specimen collection / handling, submission of specimen other  than nasopharyngeal swab, presence of viral mutation(s) within the  areas targeted by this assay, and inadequate number of viral copies  (<250 copies / mL). A negative result must be combined with clinical  observations, patient history, and epidemiological information. If result is POSITIVE SARS-CoV-2 target nucleic acids are DETECTED. The SARS-CoV-2 RNA is generally detectable in upper and lower  respiratory specimens dur ing the acute phase of infection.  Positive  results are indicative of active infection with SARS-CoV-2.  Clinical  correlation with patient history and other diagnostic information is  necessary to determine patient infection status.  Positive results do  not rule out bacterial infection or co-infection with other viruses. If result is PRESUMPTIVE POSTIVE SARS-CoV-2 nucleic acids MAY BE PRESENT.   A presumptive positive result was obtained on the submitted specimen  and confirmed on repeat testing.  While 2019 novel coronavirus  (SARS-CoV-2) nucleic acids may  be present in the submitted sample  additional confirmatory testing may be necessary for epidemiological  and / or clinical management purposes  to differentiate between  SARS-CoV-2 and other Sarbecovirus currently known to infect humans.  If clinically indicated additional testing with an alternate test  methodology [phone]) is advised. The SARS-CoV-2 RNA is generally  detectable in upper and lower respiratory sp ecimens during the acute  phase of infection. The expected result is Negative. Fact Sheet for Patients:  StrictlyIdeas.no Fact Sheet for Healthcare Providers: BankingDealers.co.za This test is not yet approved or cleared by the [place] FDA and has been authorized for detection and/or diagnosis of SARS-CoV-2 by FDA under an Emergency Use Authorization (EUA).  This EUA will remain in effect (meaning this test can be used) for the duration of the COVID-19 declaration under Section 564(b)(1) of the Act, 21 U.S.C. section 360bbb-3(b)(1), unless the authorization is terminated or revoked sooner. Performed at [facility], [place] [address] [place], [place] [address]   Blood culture (routine x 2)     Status: None (Preliminary result)   Collection Time: 08/03/18  6:43 PM  Result Value Ref Range Status   Specimen Description   Final    BLOOD LEFT ANTECUBITAL Performed at [place] [address], [place], [place] [address]    Special Requests   Final    BOTTLES DRAWN AEROBIC AND ANAEROBIC Blood Culture adequate volume Performed at [place] [address] [place], [place] [address]    Culture   Final    NO GROWTH 2 DAYS Performed at [place] [address], [place], [place] [address]    Report Status PENDING  Incomplete  Blood culture (routine x 2)     Status: None (Preliminary result)   Collection Time: 08/03/18  6:52 PM  Result Value Ref Range  Status   Specimen Description   Final    BLOOD BLOOD LEFT FOREARM Performed at [place] [address], [place], [place] [address]    Special Requests   Final    BOTTLES DRAWN AEROBIC ONLY Blood Culture results may not be optimal due to an inadequate volume of blood received  in culture bottles Performed at [facility], [place] [address], [place], [place] [address]    Culture   Final    NO GROWTH 2 DAYS Performed at [facility] [place] [address] [place], [place] [address]    Report Status PENDING  Incomplete  Respiratory Panel by PCR     Status: None   Collection Time: 08/03/18 10:13 PM  Result Value Ref Range Status   Adenovirus NOT DETECTED NOT DETECTED Final   Coronavirus 229E NOT DETECTED NOT DETECTED Final    Comment: (NOTE) The Coronavirus on the Respiratory Panel, DOES NOT test for the novel  Coronavirus (2019 nCoV)    Coronavirus HKU1 NOT DETECTED NOT DETECTED Final   Coronavirus NL63 NOT DETECTED NOT DETECTED Final   Coronavirus OC43 NOT DETECTED NOT DETECTED Final   Metapneumovirus NOT DETECTED NOT DETECTED Final   Rhinovirus / Enterovirus NOT DETECTED NOT DETECTED Final   Influenza A NOT DETECTED NOT DETECTED Final   Influenza B NOT DETECTED NOT DETECTED Final   Parainfluenza Virus 1 NOT DETECTED NOT DETECTED Final   Parainfluenza Virus 2 NOT DETECTED NOT DETECTED Final   Parainfluenza Virus 3 NOT DETECTED NOT DETECTED Final   Parainfluenza Virus 4 NOT DETECTED NOT DETECTED Final   Respiratory Syncytial Virus NOT DETECTED NOT DETECTED Final   Bordetella pertussis NOT DETECTED NOT DETECTED Final   Chlamydophila pneumoniae NOT DETECTED NOT DETECTED Final   Mycoplasma pneumoniae NOT DETECTED NOT DETECTED Final    Comment: Performed at [facility], [address], [place], [place] [address]         Radiology Studies: No results found.      Scheduled Meds: . ALPRAZolam  0.5 mg Oral QHS  . arformoterol  15  mcg Nebulization BID  . atorvastatin  20 mg Oral q1800  . azithromycin  250 mg Oral QHS  . budesonide (PULMICORT) nebulizer solution  0.5 mg Nebulization BID  . famotidine  20 mg Oral BID  . fluticasone  2 spray Each Nare Daily  . guaiFENesin  1,200 mg Oral BID  . insulin aspart  0-15 Units Subcutaneous TID WC  . insulin aspart  3 Units Subcutaneous TID WC  . insulin glargine  12 Units Subcutaneous Daily  . ipratropium-albuterol  3 mL Nebulization Q6H  . loratadine  10 mg Oral Daily  . methylPREDNISolone (SOLU-MEDROL) injection  40 mg Intravenous Q12H  . pantoprazole  40 mg Oral Q0600  . traZODone  25 mg Oral QHS   Continuous Infusions: . ceFEPime (MAXIPIME) IV Stopped (08/06/18 0615)     LOS: 2 days    Time spent: 35 minutes    Martin Seal, MD [facility]  If 7PM-7AM, please contact night-coverage [email or web address] 08/06/2018, 12:57 PM

## 2018-08-07 LAB — BASIC METABOLIC PANEL
Anion gap: 8 (ref 5–15)
BUN: 26 mg/dL — ABNORMAL HIGH (ref 8–23)
CO2: 24 mmol/L (ref 22–32)
Calcium: 7.7 mg/dL — ABNORMAL LOW (ref 8.9–10.3)
Chloride: 102 mmol/L (ref 98–111)
Creatinine, Ser: 0.99 mg/dL (ref 0.61–1.24)
GFR calc Af Amer: >60 mL/min (ref >60)
GFR calc non Af Amer: >60 mL/min (ref >60)
Glucose, Bld: 284 mg/dL — ABNORMAL HIGH (ref 70–99)
Potassium: 4.5 mmol/L (ref 3.5–5.1)
Sodium: 134 mmol/L — ABNORMAL LOW (ref 135–145)

## 2018-08-07 LAB — CBC WITH DIFFERENTIAL/PLATELET
Abs Immature Granulocytes: 0.25 10*3/uL — ABNORMAL HIGH (ref 0.00–0.07)
Basophils Absolute: 0 10*3/uL (ref 0.0–0.1)
Basophils Relative: 0 %
Eosinophils Absolute: 0 10*3/uL (ref 0.0–0.5)
Eosinophils Relative: 0 %
HCT: 28.2 % — ABNORMAL LOW (ref 39.0–52.0)
Hemoglobin: 8.6 g/dL — ABNORMAL LOW (ref 13.0–17.0)
Immature Granulocytes: 1 %
Lymphocytes Relative: 2 %
Lymphs Abs: 0.3 10*3/uL — ABNORMAL LOW (ref 0.7–4.0)
MCH: 32.3 pg (ref 26.0–34.0)
MCHC: 30.5 g/dL (ref 30.0–36.0)
MCV: 106 fL — ABNORMAL HIGH (ref 80.0–100.0)
Monocytes Absolute: 0.4 10*3/uL (ref 0.1–1.0)
Monocytes Relative: 2 %
Neutro Abs: 16.8 10*3/uL — ABNORMAL HIGH (ref 1.7–7.7)
Neutrophils Relative %: 95 %
Platelets: 155 10*3/uL (ref 150–400)
RBC: 2.66 MIL/uL — ABNORMAL LOW (ref 4.22–5.81)
RDW: 14.6 % (ref 11.5–15.5)
WBC: 17.7 10*3/uL — ABNORMAL HIGH (ref 4.0–10.5)
nRBC: 0.1 % (ref 0.0–0.2)

## 2018-08-07 LAB — GLUCOSE, CAPILLARY: Glucose-Capillary: 325 mg/dL — ABNORMAL HIGH (ref 70–99)

## 2018-08-07 MED ORDER — SODIUM CHLORIDE 0.9 % IV SOLN
2.0000 g | Freq: Two times a day (BID) | INTRAVENOUS | Status: DC
  Filled 2018-08-07: qty 2

## 2018-08-07 MED ORDER — PREDNISONE 20 MG PO TABS
60.0000 mg | ORAL_TABLET | Freq: Every day | ORAL | Status: DC
  Administered 2018-08-07 – 2018-08-08 (×2): 60 mg via ORAL
  Filled 2018-08-07 (×2): qty 3

## 2018-08-07 MED ORDER — IPRATROPIUM-ALBUTEROL 0.5-2.5 (3) MG/3ML IN SOLN
3.0000 mL | Freq: Four times a day (QID) | RESPIRATORY_TRACT | Status: DC
  Administered 2018-08-07: 3 mL via RESPIRATORY_TRACT
  Filled 2018-08-07 (×2): qty 3

## 2018-08-07 MED ORDER — CEFDINIR 300 MG PO CAPS
300.0000 mg | ORAL_CAPSULE | Freq: Two times a day (BID) | ORAL | Status: DC
  Administered 2018-08-07 – 2018-08-08 (×2): 300 mg via ORAL
  Filled 2018-08-07 (×2): qty 1

## 2018-08-07 NOTE — Progress Notes (Addendum)
PROGRESS NOTE    Martin [name]  VZC:[id number] DOB: 1930/06/11 DOA: 08/03/2018 PCP: Susy Frizzle, MD    Brief Narrative:  HPI per Dr. Graciella Freer Martin [name] is a 85 y.o. male with medical history significant of COPD, on 2 L oxygen at home, hypertension, hyperlipidemia, DM, stroke, GERD, metastasized non-small cell lung cancer, GI bleeding, CAD, atrial fibrillation not on anticoagulants, who presents with shortness of breath.  Patient states that he has been having shortness of breath in the past 3 days, which has been progressively worsening.  He has cough with streaks of dark red blood production.  Denies fever or chills.  No chest pain.  Patient uses 2 L oxygen at home, currently his saturation is 96% on 2 L oxygen in ED.  Patient denies nausea, vomiting, diarrhea, abdominal pain.  No symptoms of UTI. Pt states that he is living alone at home.  ED Course: pt was found to have WBC 16.3, troponin 0.03, lactic acid of 1.7, BNP 259.9, negative COVID-19 test, electrolytes renal function okay, temperature normal, soft blood pressure, heart rate 8290s, RR 18-21.  Patient is placed on telemetry bed for observation.  CXR showed: 1. Prominent enlargement of the pleural-based/chest wall mass along the left upper chest with lytic destruction of the left fourth and part of the fifth rib. Rapid growth since 05/19/2018. 2. Enlarging right apical pulmonary nodule compatible with malignancy. 3. New retrocardiac nodularity measuring 2.8 by 2.0 cm, possibly malignancy or focal pneumonia. 4. Aortic Atherosclerosis (ICD10-I70.0).  Assessment & Plan:   Principal Problem:   COPD exacerbation ([place]) Active Problems:   HCAP (healthcare-associated pneumonia)   HLD (hyperlipidemia)   Diabetes mellitus, type 2 ([place])   Stroke [facility])   Essential hypertension   Anxiety state   Metastatic lung cancer (metastasis from lung to other site) ([place])   Elevated troponin   Macrocytic anemia   Hemoptysis   GERD  (gastroesophageal reflux disease)   CAD (coronary artery disease)   COPD (chronic obstructive pulmonary disease) ([facility])   Shortness of breath   1 acute COPD exacerbation with chronic respiratory failure with hypoxia Patient presented with worsening shortness of breath, productive cough and noted to be on 2 L nasal cannula on home.  Patient not noted to have any increased O2 requirements however with complaints of shortness of breath.  Chest x-ray done showing retrocardiac nodularity possibly malignancy or focal pneumonia.  Patient noted to have a leukocytosis on admission with a white count of 16.3 however was afebrile.  Leukocytosis somewhat fluctuating however patient also on IV steroids. Patient recently hospitalized.  Patient noted to have rhonchi on auscultation.  Patient with some reports of hemoptysis.  Patient denies any acute chest pain and no increased O2 requirements and as such low suspicion for PE.  Discontinue IV Solu-Medrol and placed on oral prednisone 60 mg daily.  Continue antibiotics and transition to oral antibiotics. Continue Pulmicort and Brovana and scheduled duo nebs, Claritin, Flonase, PPI.  Continue antibiotics due to concerns for possible pneumonia and metastatic disease in patient likely immunocompromised and as such patient started on IV cefepime.  Palliative care following.   2.  Probable healthcare associated pneumonia Patient with underlying metastatic lung cancer presenting worsening shortness of breath, productive cough, poor air movement on 2 L nasal cannula.  Chest x-ray done concerning for retrocardiac nodularity possibly malignancy versus focal pneumonia.  Patient recently hospitalized.  Patient immunocompromised.  Sputum Gram stain and cultures pending.  Respiratory viral panel negative.  Blood cultures  with no growth to date.  Urine strep pneumococcus antigen is negative.  Urine Legionella antigen is negative.  Patient slowly improving clinically.  Continue IV  cefepime and azithromycin and transition to oral antibiotics today 08/07/2018.  Continue duo nebs.  Supportive care.   3.  Metastatic lung cancer with mets from the lung to other site/failure to thrive Patient with stage IV non-small cell carcinoma noted to present with right upper lobe lung mass in addition to metastatic disease to the left fourth rib as well as right sacral area and pleural-based nodularity.  Patient status post palliative radiotherapy to the right sacral metastatic bone lesion under the care of Dr. Tammi Klippel.  Patient was on chemotherapy which was stopped a month ago.  Patient with poor oral intake.  Palliative care consulted and patient likely when medically stable to be discharged home with hospice versus residential hospice home.  Appreciate palliative care input and recommendations.  4.  Hyperlipidemia Continue statin.  5.  Diet-controlled diabetes type 2 mellitus without complications Hemoglobin A1c was 6.7 on 03/30/2018.  Patient noted to have elevated CBGs likely secondary to IV steroids.  CBG noted to be 287 last night.  CBG was 88 at 9:35 PM on 08/05/2018.  Continue Lantus to 12 units daily, novoLog 3 units meal coverage.  Continue moderate scale sliding scale insulin.  Follow with steroid taper.   6.  History of CVA Plavix on hold due to complaints of hemoptysis.  Continue Lipitor.  7.  Hypertension  Stable. IV hydralazine as needed.  8.  Anxiety Continue Xanax.  9.  History of coronary artery disease and elevated troponin Patient denies any acute chest pain.  Patient did have some chest discomfort.  Cardiac enzymes were cycled and negative x3.  Plavix on hold due to hemoptysis.  Continue Lipitor.  LDL of 22.  10.  Macrocytic anemia Hemoglobin currently at 8.6 and currently stable.  Patient denies any overt bleeding.  Follow H&H.   11.  Hemoptysis Likely secondary to metastatic lung cancer.  Plavix on hold.  Hemoptysis improved.   12.  Gastroesophageal reflux  disease Continue Pepcid.  13.  Anemia of chronic disease Patient denies any overt bleeding.  Hemoglobin is 8.6 from 7.8 from 7.5 from 9.2.  Anemia panel consistent with anemia of chronic disease.  Follow H&H.  Transfusion threshold hemoglobin less than 7.   DVT prophylaxis: SCDs Code Status: DNR Family Communication: Updated patient.  No family at bedside. Disposition Plan: Home with Hospice versus residential hospice home when medically stable hopefully in the next 1 to 2 days.   Consultants:   Palliative care: Dr. Rowe Pavy 08/04/2018  Procedures:  Chest x-ray 08/03/2018    Antimicrobials:   Azithromycin 08/03/2018  IV cefepime 08/04/2018>>>> 08/07/2018  Omnicef 08/07/2018   Subjective: Patient sleeping however easily arousable.  States he wants to be left alone to sleep.  Denies any chest pain.  States some improvement with shortness of breath however not at baseline.  Patient with significantly poor oral intake may be a teaspoon of his oatmeal the rest of his food is sitting on the tray.  Patient with poor appetite.   Objective: Vitals:   08/06/18 2000 08/06/18 2036 08/07/18 0537 08/07/18 0731  BP: 137/61  (!) 148/59   Pulse: 81  85 85  Resp: 18  16 17   Temp: 98.1 F (36.7 C)  98.6 F (37 C)   TempSrc: Oral  Oral   SpO2: 100% 95% 94% 94%  Weight:  Height:        Intake/Output Summary (Last 24 hours) at 08/07/2018 1210 Last data filed at 08/07/2018 0604 Gross per 24 hour  Intake 498.18 ml  Output 225 ml  Net 273.18 ml   Filed Weights   08/03/18 1814  Weight: 63.5 kg    Examination:  General exam: Dry mucous membranes. Respiratory system: Poor to fair air movement.  Some scattered coarse breath sounds anterior lung fields.  No wheezing.  No crackles.  Speaking in full sentences.  Normal respiratory effort.  Cardiovascular system: Regular rate rhythm no murmurs rubs or gallops.  No JVD.  No lower extremity edema.  Gastrointestinal system: Abdomen is soft,  nontender, nondistended, positive bowel sounds.  No rebound.  No guarding.  Central nervous system: Alert and oriented. No focal neurological deficits. Extremities: Symmetric 5 x 5 power. Skin: No rashes, lesions or ulcers Psychiatry: Judgement and insight appear normal. Mood & affect appropriate.     Data Reviewed: I have personally reviewed following labs and imaging studies  CBC: Recent Labs  Lab 08/03/18 1842 08/04/18 1003 08/05/18 0543 08/05/18 1519 08/06/18 0552 08/07/18 0540  WBC 16.3* 18.7* 14.4*  --  15.1* 17.7*  NEUTROABS 15.3* 17.9* 13.6*  --  14.2* 16.8*  HGB 9.5* 9.2* 7.5* 7.5* 7.8* 8.6*  HCT 31.4* 29.5* 24.5* 25.2* 26.2* 28.2*  MCV 104.7* 105.7* 105.2*  --  106.9* 106.0*  PLT 188 192 157  --  149* 308   Basic Metabolic Panel: Recent Labs  Lab 08/03/18 1842 08/04/18 0414 08/05/18 0543 08/06/18 0552 08/07/18 0540  NA 134* 137 133* 134* 134*  K 3.7 3.6 3.7 4.1 4.5  CL 95* 101 102 103 102  CO2 28 26 24 25 24   GLUCOSE 494* 383* 316* 204* 284*  BUN 21 20 26* 27* 26*  CREATININE 0.98 0.91 0.92 0.94 0.99  CALCIUM 8.0* 7.8* 7.4* 7.5* 7.7*  MG  --  1.7 2.2  --   --    GFR: Estimated Creatinine Clearance: 49 mL/min (by C-G formula based on SCr of 0.99 mg/dL). Liver Function Tests: Recent Labs  Lab 08/03/18 1842  AST 12*  ALT 11  ALKPHOS 118  BILITOT 0.7  PROT 5.6*  ALBUMIN 2.2*   No results for input(s): LIPASE, AMYLASE in the last 168 hours. No results for input(s): AMMONIA in the last 168 hours. Coagulation Profile: No results for input(s): INR, PROTIME in the last 168 hours. Cardiac Enzymes: Recent Labs  Lab 08/03/18 1842 08/03/18 2319 08/04/18 0414 08/04/18 1003  TROPONINI 0.03* <0.03 <0.03 <0.03   BNP (last 3 results) No results for input(s): PROBNP in the last 8760 hours. HbA1C: No results for input(s): HGBA1C in the last 72 hours. CBG: Recent Labs  Lab 08/05/18 2135 08/06/18 0756 08/06/18 1156 08/06/18 1708 08/06/18 2101   GLUCAP 88 185* 245* 303* 287*   Lipid Profile: No results for input(s): CHOL, HDL, LDLCALC, TRIG, CHOLHDL, LDLDIRECT in the last 72 hours. Thyroid Function Tests: No results for input(s): TSH, T4TOTAL, FREET4, T3FREE, THYROIDAB in the last 72 hours. Anemia Panel: Recent Labs    08/05/18 0543  VITAMINB12 427  FOLATE 6.5  FERRITIN 1,381*  TIBC 107*  IRON 22*   Sepsis Labs: Recent Labs  Lab 08/03/18 1843 08/03/18 2213  LATICACIDVEN 1.7 1.0    Recent Results (from the past 240 hour(s))  SARS Coronavirus 2 (CEPHEID - Performed in [facility] hospital lab), Hosp Order     Status: None   Collection Time: 08/03/18  6:33 PM  Result Value Ref Range Status   SARS Coronavirus 2 NEGATIVE NEGATIVE Final    Comment: (NOTE) If result is NEGATIVE SARS-CoV-2 target nucleic acids are NOT DETECTED. The SARS-CoV-2 RNA is generally detectable in upper and lower  respiratory specimens during the acute phase of infection. The lowest  concentration of SARS-CoV-2 viral copies this assay can detect is 250  copies / mL. A negative result does not preclude SARS-CoV-2 infection  and should not be used as the sole basis for treatment or other  patient management decisions.  A negative result may occur with  improper specimen collection / handling, submission of specimen other  than nasopharyngeal swab, presence of viral mutation(s) within the  areas targeted by this assay, and inadequate number of viral copies  (<250 copies / mL). A negative result must be combined with clinical  observations, patient history, and epidemiological information. If result is POSITIVE SARS-CoV-2 target nucleic acids are DETECTED. The SARS-CoV-2 RNA is generally detectable in upper and lower  respiratory specimens dur ing the acute phase of infection.  Positive  results are indicative of active infection with SARS-CoV-2.  Clinical  correlation with patient history and other diagnostic information is  necessary to  determine patient infection status.  Positive results do  not rule out bacterial infection or co-infection with other viruses. If result is PRESUMPTIVE POSTIVE SARS-CoV-2 nucleic acids MAY BE PRESENT.   A presumptive positive result was obtained on the submitted specimen  and confirmed on repeat testing.  While 2019 novel coronavirus  (SARS-CoV-2) nucleic acids may be present in the submitted sample  additional confirmatory testing may be necessary for epidemiological  and / or clinical management purposes  to differentiate between  SARS-CoV-2 and other Sarbecovirus currently known to infect humans.  If clinically indicated additional testing with an alternate test  methodology [phone]) is advised. The SARS-CoV-2 RNA is generally  detectable in upper and lower respiratory sp ecimens during the acute  phase of infection. The expected result is Negative. Fact Sheet for Patients:  StrictlyIdeas.no Fact Sheet for Healthcare Providers: BankingDealers.co.za This test is not yet approved or cleared by the [place] FDA and has been authorized for detection and/or diagnosis of SARS-CoV-2 by FDA under an Emergency Use Authorization (EUA).  This EUA will remain in effect (meaning this test can be used) for the duration of the COVID-19 declaration under Section 564(b)(1) of the Act, 21 U.S.C. section 360bbb-3(b)(1), unless the authorization is terminated or revoked sooner. Performed at [facility], [place] [address], [place], [place] [address]   Blood culture (routine x 2)     Status: None (Preliminary result)   Collection Time: 08/03/18  6:43 PM  Result Value Ref Range Status   Specimen Description   Final    BLOOD LEFT ANTECUBITAL Performed at [facility] [address], [place], [place] [address]    Special Requests   Final    BOTTLES DRAWN AEROBIC AND ANAEROBIC Blood Culture adequate volume  Performed at [place] [address] [place], [place] [address]    Culture   Final    NO GROWTH 3 DAYS Performed at [facility], [place] [address], [place], [place] [address]    Report Status PENDING  Incomplete  Blood culture (routine x 2)     Status: None (Preliminary result)   Collection Time: 08/03/18  6:52 PM  Result Value Ref Range Status   Specimen Description   Final  BLOOD BLOOD LEFT FOREARM Performed at [facility] [address], [place], [place] [address]    Special Requests   Final    BOTTLES DRAWN AEROBIC ONLY Blood Culture results may not be optimal due to an inadequate volume of blood received in culture bottles Performed at [facility] [address], [place], [place] [address]    Culture   Final    NO GROWTH 3 DAYS Performed at [facility], [place] [address] [place], [place] [address]    Report Status PENDING  Incomplete  Respiratory Panel by PCR     Status: None   Collection Time: 08/03/18 10:13 PM  Result Value Ref Range Status   Adenovirus NOT DETECTED NOT DETECTED Final   Coronavirus 229E NOT DETECTED NOT DETECTED Final    Comment: (NOTE) The Coronavirus on the Respiratory Panel, DOES NOT test for the novel  Coronavirus (2019 nCoV)    Coronavirus HKU1 NOT DETECTED NOT DETECTED Final   Coronavirus NL63 NOT DETECTED NOT DETECTED Final   Coronavirus OC43 NOT DETECTED NOT DETECTED Final   Metapneumovirus NOT DETECTED NOT DETECTED Final   Rhinovirus / Enterovirus NOT DETECTED NOT DETECTED Final   Influenza A NOT DETECTED NOT DETECTED Final   Influenza B NOT DETECTED NOT DETECTED Final   Parainfluenza Virus 1 NOT DETECTED NOT DETECTED Final   Parainfluenza Virus 2 NOT DETECTED NOT DETECTED Final   Parainfluenza Virus 3 NOT DETECTED NOT DETECTED Final   Parainfluenza Virus 4 NOT DETECTED NOT DETECTED Final   Respiratory Syncytial Virus NOT DETECTED NOT DETECTED Final   Bordetella  pertussis NOT DETECTED NOT DETECTED Final   Chlamydophila pneumoniae NOT DETECTED NOT DETECTED Final   Mycoplasma pneumoniae NOT DETECTED NOT DETECTED Final    Comment: Performed at [facility], [address] [place], [place] [address]         Radiology Studies: No results found.      Scheduled Meds: . ALPRAZolam  0.5 mg Oral QHS  . arformoterol  15 mcg Nebulization BID  . atorvastatin  20 mg Oral q1800  . azithromycin  250 mg Oral QHS  . budesonide (PULMICORT) nebulizer solution  0.5 mg Nebulization BID  . famotidine  20 mg Oral BID  . fluticasone  2 spray Each Nare Daily  . guaiFENesin  1,200 mg Oral BID  . insulin aspart  0-15 Units Subcutaneous TID WC  . insulin aspart  3 Units Subcutaneous TID WC  . insulin glargine  12 Units Subcutaneous Daily  . ipratropium-albuterol  3 mL Nebulization Q6H WA  . loratadine  10 mg Oral Daily  . pantoprazole  40 mg Oral Q0600  . predniSONE  60 mg Oral QAC breakfast  . traZODone  25 mg Oral QHS   Continuous Infusions: . ceFEPime (MAXIPIME) IV 1 g (08/07/18 0604)     LOS: 3 days    Time spent: 35 minutes    Irine Seal, MD [facility]  If 7PM-7AM, please contact night-coverage [email or web address] 08/07/2018, 12:10 PM

## 2018-08-07 NOTE — Progress Notes (Signed)
PHARMACY NOTE:  ANTIMICROBIAL RENAL DOSAGE ADJUSTMENT  Current antimicrobial regimen includes a mismatch between antimicrobial dosage and estimated renal function.  As per policy approved by the Pharmacy & Therapeutics and Medical Executive Committees, the antimicrobial dosage will be adjusted accordingly.  Current antimicrobial dosage:  Cefepime 1g IV q8  Indication: HAP  Renal Function:  Estimated Creatinine Clearance: 49 mL/min (by C-G formula based on SCr of 0.99 mg/dL). []      On intermittent HD, scheduled: []      On CRRT    Antimicrobial dosage has been changed to:  Cefepime 2g IV q12 for CrCl 30-60 ml/min  Additional comments:   Thank you for allowing pharmacy to be a part of this patient's care.  Arlyn Dunning Vayas, [facility] 08/07/2018 2:15 PM

## 2018-08-07 NOTE — Progress Notes (Signed)
[id number] [facility] RN note  Received request from Dessa Phi, [facility] for patient and family interest in [facility] with request to transfer when IV antibiotics are completed. Chart reviewed and eligibility confirmed. Spoke with grandson Delfino Lovett to confirm interest and have arranged to complete paper work Sunday 08/08/18 at 1000 am. Plan per grandson is for patient to transfer to [facility] on Sunday 08/08/18.  Please fax discharge summary to [phone].  RN please call report to [phone].  I will notify Dessa Phi, [facility] when paper work is completed to arrange for transportation.  Please call with any questions or concerns.  Thank you,  Margaretmary Eddy, RN, BSN [phone]  [facility] liaisons are on [place] listed under Hospice and [place]

## 2018-08-07 NOTE — Progress Notes (Signed)
Patient's blood glucose this evening was 325. [name], NP on call was notified. No new orders given at this time. Will continue to monitor. Martin [name] I

## 2018-08-08 DIAGNOSIS — J9601 Acute respiratory failure with hypoxia: Secondary | ICD-10-CM

## 2018-08-08 LAB — CBC WITH DIFFERENTIAL/PLATELET
Abs Immature Granulocytes: 0.25 10*3/uL — ABNORMAL HIGH (ref 0.00–0.07)
Basophils Absolute: 0 10*3/uL (ref 0.0–0.1)
Basophils Relative: 0 %
Eosinophils Absolute: 0 10*3/uL (ref 0.0–0.5)
Eosinophils Relative: 0 %
HCT: 30.2 % — ABNORMAL LOW (ref 39.0–52.0)
Hemoglobin: 8.8 g/dL — ABNORMAL LOW (ref 13.0–17.0)
Immature Granulocytes: 1 %
Lymphocytes Relative: 3 %
Lymphs Abs: 0.6 10*3/uL — ABNORMAL LOW (ref 0.7–4.0)
MCH: 31.5 pg (ref 26.0–34.0)
MCHC: 29.1 g/dL — ABNORMAL LOW (ref 30.0–36.0)
MCV: 108.2 fL — ABNORMAL HIGH (ref 80.0–100.0)
Monocytes Absolute: 0.7 10*3/uL (ref 0.1–1.0)
Monocytes Relative: 3 %
Neutro Abs: 18.5 10*3/uL — ABNORMAL HIGH (ref 1.7–7.7)
Neutrophils Relative %: 93 %
Platelets: 166 10*3/uL (ref 150–400)
RBC: 2.79 MIL/uL — ABNORMAL LOW (ref 4.22–5.81)
RDW: 14.6 % (ref 11.5–15.5)
WBC: 20 10*3/uL — ABNORMAL HIGH (ref 4.0–10.5)
nRBC: 0.3 % — ABNORMAL HIGH (ref 0.0–0.2)

## 2018-08-08 LAB — CULTURE, BLOOD (ROUTINE X 2)
Culture: NO GROWTH
Culture: NO GROWTH
Special Requests: ADEQUATE

## 2018-08-08 LAB — GLUCOSE, CAPILLARY
Glucose-Capillary: 148 mg/dL — ABNORMAL HIGH (ref 70–99)
Glucose-Capillary: 48 mg/dL — ABNORMAL LOW (ref 70–99)
Glucose-Capillary: 60 mg/dL — ABNORMAL LOW (ref 70–99)
Glucose-Capillary: 70 mg/dL (ref 70–99)

## 2018-08-08 LAB — BASIC METABOLIC PANEL
Anion gap: 9 (ref 5–15)
BUN: 25 mg/dL — ABNORMAL HIGH (ref 8–23)
CO2: 23 mmol/L (ref 22–32)
Calcium: 7.7 mg/dL — ABNORMAL LOW (ref 8.9–10.3)
Chloride: 104 mmol/L (ref 98–111)
Creatinine, Ser: 0.93 mg/dL (ref 0.61–1.24)
GFR calc Af Amer: >60 mL/min (ref >60)
GFR calc non Af Amer: >60 mL/min (ref >60)
Glucose, Bld: 239 mg/dL — ABNORMAL HIGH (ref 70–99)
Potassium: 3.6 mmol/L (ref 3.5–5.1)
Sodium: 136 mmol/L (ref 135–145)

## 2018-08-08 MED ORDER — IPRATROPIUM-ALBUTEROL 0.5-2.5 (3) MG/3ML IN SOLN: 3.0000 mL | Freq: Three times a day (TID) | RESPIRATORY_TRACT | 0 refills | Status: AC

## 2018-08-08 MED ORDER — HYDROCODONE-ACETAMINOPHEN 5-325 MG PO TABS: 1.0000 | ORAL_TABLET | Freq: Four times a day (QID) | ORAL | 0 refills | Status: AC | PRN

## 2018-08-08 MED ORDER — GABAPENTIN 600 MG PO TABS
300.0000 mg | ORAL_TABLET | Freq: Three times a day (TID) | ORAL | Status: DC
  Filled 2018-08-08: qty 0.5

## 2018-08-08 MED ORDER — IPRATROPIUM-ALBUTEROL 0.5-2.5 (3) MG/3ML IN SOLN
3.0000 mL | Freq: Three times a day (TID) | RESPIRATORY_TRACT | Status: DC
  Filled 2018-08-08: qty 3

## 2018-08-08 MED ORDER — GLUCOSE 40 % PO GEL
ORAL | Status: AC
  Administered 2018-08-08: 37.5 g
  Filled 2018-08-08: qty 1

## 2018-08-08 MED ORDER — CEFDINIR 300 MG PO CAPS: 300.0000 mg | ORAL_CAPSULE | Freq: Two times a day (BID) | ORAL | 0 refills | Status: AC

## 2018-08-08 MED ORDER — INSULIN GLARGINE 100 UNIT/ML ~~LOC~~ SOLN: 10.0000 [IU] | Freq: Every day | SUBCUTANEOUS | 0 refills | Status: DC

## 2018-08-08 MED ORDER — PREDNISONE 20 MG PO TABS: 20.0000 mg | ORAL_TABLET | Freq: Every day | ORAL | 0 refills | Status: AC

## 2018-08-08 MED ORDER — LORATADINE 10 MG PO TABS: 10.0000 mg | ORAL_TABLET | Freq: Every day | ORAL | 0 refills | Status: AC

## 2018-08-08 MED ORDER — GABAPENTIN 300 MG PO CAPS
300.0000 mg | ORAL_CAPSULE | Freq: Three times a day (TID) | ORAL | Status: DC
  Administered 2018-08-08: 300 mg via ORAL
  Filled 2018-08-08: qty 1

## 2018-08-08 MED ORDER — PREDNISONE 20 MG PO TABS: 40.0000 mg | ORAL_TABLET | Freq: Every day | ORAL | Status: DC

## 2018-08-08 MED ORDER — PANTOPRAZOLE SODIUM 40 MG PO TBEC: 40.0000 mg | DELAYED_RELEASE_TABLET | Freq: Every day | ORAL | 0 refills | Status: AC

## 2018-08-08 MED ORDER — ALPRAZOLAM 0.5 MG PO TABS: 0.5000 mg | ORAL_TABLET | Freq: Every day | ORAL | 0 refills | Status: AC

## 2018-08-08 MED ORDER — INSULIN GLARGINE 100 UNIT/ML ~~LOC~~ SOLN: 12.0000 [IU] | Freq: Every day | SUBCUTANEOUS | 0 refills | Status: DC

## 2018-08-08 MED ORDER — GABAPENTIN 300 MG PO CAPS: 300.0000 mg | ORAL_CAPSULE | Freq: Three times a day (TID) | ORAL | 0 refills | Status: AC

## 2018-08-08 MED ORDER — HYDROCODONE-HOMATROPINE 5-1.5 MG/5ML PO SYRP: 5.0000 mL | ORAL_SOLUTION | Freq: Four times a day (QID) | ORAL | 0 refills | Status: AC | PRN

## 2018-08-08 MED ORDER — KETOROLAC TROMETHAMINE 30 MG/ML IJ SOLN
15.0000 mg | Freq: Once | INTRAMUSCULAR | Status: AC
  Administered 2018-08-08: 15 mg via INTRAVENOUS
  Filled 2018-08-08: qty 1

## 2018-08-08 MED ORDER — GLUCOSE 40 % PO GEL: 2.0000 | ORAL | Status: DC

## 2018-08-08 MED ORDER — MORPHINE SULFATE (PF) 2 MG/ML IV SOLN
0.5000 mg | Freq: Once | INTRAVENOUS | Status: AC
  Administered 2018-08-08: 0.5 mg via INTRAVENOUS
  Filled 2018-08-08: qty 1

## 2018-08-08 MED ORDER — GUAIFENESIN ER 600 MG PO TB12: 1200.0000 mg | ORAL_TABLET | Freq: Two times a day (BID) | ORAL | 0 refills | Status: AC

## 2018-08-08 MED ORDER — FLUTICASONE PROPIONATE 50 MCG/ACT NA SUSP: 2.0000 | Freq: Every day | NASAL | 0 refills | Status: AC

## 2018-08-08 NOTE — Discharge Summary (Addendum)
Physician Discharge Summary  JLYN BRACAMONTE VPX:[id number] DOB: Oct 16, 1930 DOA: 08/03/2018  PCP: Susy Frizzle, MD  Admit date: 08/03/2018 Discharge date: 08/08/2018  Time spent: 55 minutes  Recommendations for Outpatient Follow-up:  1. Patient to be discharged to residential hospice home at [facility].  [facility].   Discharge Diagnoses:  Principal Problem:   COPD exacerbation ([place]) Active Problems:   HCAP (healthcare-associated pneumonia)   HLD (hyperlipidemia)   Diabetes mellitus, type 2 ([place])   Stroke [facility])   Essential hypertension   Anxiety state   Metastatic lung cancer (metastasis from lung to other site) ([facility])   Elevated troponin   Macrocytic anemia   Hemoptysis   GERD (gastroesophageal reflux disease)   CAD (coronary artery disease)   COPD (chronic obstructive pulmonary disease) ([facility])   Shortness of breath   Acute respiratory failure with hypoxia ([place])   Discharge Condition: Stable  Diet recommendation: Regular  Filed Weights   08/03/18 1814  Weight: 63.5 kg    History of present illness:  Per Dr Graciella Freer Martin [name] is a 85 y.o. male with medical history significant of COPD, on 2 L oxygen at home, hypertension, hyperlipidemia, DM, stroke, GERD, metastasized non-small cell lung cancer, GI bleeding, CAD, atrial fibrillation not on anticoagulants, who presents with shortness of breath.  Patient states that he has been having shortness of breath in the past 3 days, which has been progressively worsening.  He has cough with streaks of dark red blood production.  Denies fever or chills.  No chest pain.  Patient uses 2 L oxygen at home, currently his saturation is 96% on 2 L oxygen in ED.  Patient denies nausea, vomiting, diarrhea, abdominal pain.  No symptoms of UTI. Pt states that he is living alone at home.  ED Course: pt was found to have WBC 16.3, troponin 0.03, lactic acid of 1.7, BNP 259.9, negative COVID-19 test, electrolytes renal  function okay, temperature normal, soft blood pressure, heart rate 8290s, RR 18-21.  Patient is placed on telemetry bed for observation.  CXR showed: 1. Prominent enlargement of the pleural-based/chest wall mass along the left upper chest with lytic destruction of the left fourth and part of the fifth rib. Rapid growth since 05/19/2018. 2. Enlarging right apical pulmonary nodule compatible with malignancy. 3. New retrocardiac nodularity measuring 2.8 by 2.0 cm, possibly malignancy or focal pneumonia. 4. Aortic Atherosclerosis (ICD10-I70.0).   Hospital Course:  1 acute COPD exacerbation with chronic respiratory failure with hypoxia Patient presented with worsening shortness of breath, productive cough and noted to be on 2 L nasal cannula on home.  Patient not noted to have any increased O2 requirements however with complaints of shortness of breath.  Chest x-ray done showing retrocardiac nodularity possibly malignancy or focal pneumonia.  Patient noted to have a leukocytosis on admission with a white count of 16.3 however was afebrile.  Leukocytosis somewhat fluctuating however patient also on IV steroids. Patient recently hospitalized.  Patient noted to have rhonchi on auscultation.  Patient with some reports of hemoptysis.  Patient denies any acute chest pain and no increased O2 requirements and as such low suspicion for PE.  Discontinued IV Solu-Medrol and placed on oral prednisone taper.  Patient was also maintained on IV antibiotics and subsequently transition to oral antibiotics.  Patient also maintained on Pulmicort, Brovana, scheduled duo nebs, Claritin, Flonase, PPI.  Patient improved clinically in terms of his acute COPD exacerbation.  Patient be discharged on 3 more days  of oral Omnicef to complete a course of antibiotic treatment.  Patient will be discharged to residential hospice home at [facility]..   2.  Probable healthcare associated pneumonia Patient with underlying metastatic lung  cancer presenting worsening shortness of breath, productive cough, poor air movement on 2 L nasal cannula.  Chest x-ray done concerning for retrocardiac nodularity possibly malignancy versus focal pneumonia.  Patient recently hospitalized.  Patient immunocompromised.  Sputum Gram stain and cultures pending.  Respiratory viral panel negative.  Blood cultures with no growth to date.  Urine strep pneumococcus antigen is negative.  Urine Legionella antigen is negative.  Patient slowly improving clinically.  Patient remained afebrile.  Patient initially on IV cefepime and azithromycin.  Patient was subsequently transition to oral Omnicef.  Patient will be discharged on 3 more days of oral Omnicef to complete a 7-day course of antibiotic treatment.  Patient was also maintained on duo nebs during the hospitalization.  Patient be discharged to residential hospice home at [facility].   3.  Metastatic lung cancer with mets from the lung to other site/failure to thrive Patient with stage IV non-small cell carcinoma noted to present with right upper lobe lung mass in addition to metastatic disease to the left fourth rib as well as right sacral area and pleural-based nodularity.  Patient status post palliative radiotherapy to the right sacral metastatic bone lesion under the care of Dr. Tammi Klippel.  Patient was on chemotherapy which was stopped a month ago.  Patient with poor oral intake.  Palliative care consulted and patient likely when medically stable to be discharged to residential hospice home.    Patient will be discharged to [facility].  4.  Hyperlipidemia Patient maintained on a statin.  5.  Diet-controlled diabetes type 2 mellitus without complications Hemoglobin A1c was 6.7 on 03/30/2018.  Patient noted to have elevated CBGs likely secondary to IV steroids.  CBG noted to be 287 last night.  CBG was 88 at 9:35 PM on 08/05/2018.    Patient was maintained on Lantus 12 units daily with NovoLog 3 units meal  coverage as well as sliding scale insulin during the hospitalization.  Steroids were tapered with some improvement with CBG.  Patient with poor oral intake and as such meal coverage insulin and lantus will be discontinued on discharge.   6.  History of CVA Plavix on hold due to complaints of hemoptysis.    Patient maintained on Lipitor.  Hemoptysis had resolved by day of discharge.  Plavix will be resumed on discharge.    7.  Hypertension  Stable. IV hydralazine as needed.  8.  Anxiety Patient maintained on home regimen of Xanax.  9.  History of coronary artery disease and elevated troponin Patient denied any acute chest pain.  Patient did have some chest discomfort.  Cardiac enzymes were cycled and negative x3.  Plavix was held due to hemoptysis.    Patient maintained on Lipitor.  LDL was 22.  Plavix will be resumed on discharge.  Outpatient follow-up.   10.  Macrocytic anemia Hemoglobin remained stable and was 8.8 by day of discharge.   11.  Hemoptysis Likely secondary to metastatic lung cancer.  Plavix was held throughout the hospitalization.  Hemoptysis improved and had resolved on discharge.   12.  Gastroesophageal reflux disease Patient maintained on a PPI during the hospitalization.   13.  Anemia of chronic disease Patient denied any overt bleeding.  Hemoglobin stabilized at 8.8 from 8.6 from 7.8 from 7.5 from 9.2.  Anemia panel consistent with anemia of chronic disease.  Follow H&H.  Transfusion threshold hemoglobin less than 7.  Patient to [facility] to [facility].   Procedures:  Chest x-ray 08/03/2018   Consultations:  Palliative care: Dr. Rowe Pavy 08/04/2018  Discharge Exam: Vitals:   08/07/18 2116 08/08/18 0435  BP: (!) 124/58 (!) 166/82  Pulse: (!) 103 64  Resp: 18 20  Temp: 98.5 F (36.9 C) 98.3 F (36.8 C)  SpO2: 99% 98%    General: NAD. Dry mucous membranes.  Chronically ill-appearing. Cardiovascular: Regular rate and rhythm no murmurs rubs or  gallops. Respiratory: Fair air movement.  Clear to auscultation anterior lung fields.  Discharge Instructions   Discharge Instructions    Diet general   Complete by:  As directed    Increase activity slowly   Complete by:  As directed      Allergies as of 08/08/2018      Reactions   Other Anaphylaxis   Chemo drugs or ivs      Medication List    STOP taking these medications   famotidine 20 MG tablet Commonly known as:  PEPCID   metoprolol succinate 25 MG 24 hr tablet Commonly known as:  TOPROL-XL     TAKE these medications   ALPRAZolam 0.5 MG tablet Commonly known as:  XANAX Take 1 tablet (0.5 mg total) by mouth at bedtime.   atorvastatin 20 MG tablet Commonly known as:  LIPITOR TAKE 1 TABLET BY MOUTH ONCE DAILY AT  6PM. What changed:  See the new instructions.   budesonide-formoterol 160-4.5 MCG/ACT inhaler Commonly known as:  SYMBICORT Inhale 2 puffs into the lungs 2 (two) times daily as needed (for COPD flares).   cefdinir 300 MG capsule Commonly known as:  OMNICEF Take 1 capsule (300 mg total) by mouth every 12 (twelve) hours for 3 days.   clopidogrel 75 MG tablet Commonly known as:  PLAVIX Take 75 mg by mouth daily.   fluticasone 50 MCG/ACT nasal spray Commonly known as:  FLONASE Place 2 sprays into both nostrils daily. Start taking on:  August 09, 2018   gabapentin 300 MG capsule Commonly known as:  NEURONTIN Take 1 capsule (300 mg total) by mouth 3 (three) times daily.   guaiFENesin 600 MG 12 hr tablet Commonly known as:  MUCINEX Take 2 tablets (1,200 mg total) by mouth 2 (two) times daily for 3 days.   HYDROcodone-acetaminophen 5-325 MG tablet Commonly known as:  NORCO/VICODIN Take 1 tablet by mouth every 6 (six) hours as needed for moderate pain.   HYDROcodone-homatropine 5-1.5 MG/5ML syrup Commonly known as:  HYCODAN Take 5 mLs by mouth every 6 (six) hours as needed for cough.   ipratropium-albuterol 0.5-2.5 (3) MG/3ML Soln Commonly known  as:  DUONEB Take 3 mLs by nebulization 3 (three) times daily.   loperamide 2 MG capsule Commonly known as:  IMODIUM Take 1 capsule (2 mg total) by mouth 3 (three) times daily as needed for diarrhea or loose stools.   loratadine 10 MG tablet Commonly known as:  CLARITIN Take 1 tablet (10 mg total) by mouth daily. Start taking on:  August 09, 2018   Nitrostat 0.4 MG SL tablet Generic drug:  nitroGLYCERIN DISSOLVE ONE TABLET UNDER THE TONGUE AS DIRECTED What changed:  See the new instructions.   ondansetron 4 MG disintegrating tablet Commonly known as:  Zofran ODT Take 1 tablet (4 mg total) by mouth every 8 (eight) hours as needed for nausea or vomiting.   pantoprazole  40 MG tablet Commonly known as:  PROTONIX Take 1 tablet (40 mg total) by mouth daily at 6 (six) AM. Start taking on:  August 09, 2018   polyethylene glycol 17 g packet Commonly known as:  MIRALAX / GLYCOLAX Take 17 g by mouth daily as needed for moderate constipation or severe constipation.   predniSONE 20 MG tablet Commonly known as:  DELTASONE Take 1-2 tablets (20-40 mg total) by mouth daily before breakfast. Take 2 tablets (40mg ) daily x3 days, then 1 tablet (20mg ) daily x3 days then stop. Start taking on:  August 09, 2018 What changed:    medication strength  how much to take  when to take this  additional instructions   traZODone 25 mg Tabs tablet Commonly known as:  DESYREL Take 25 mg by mouth at bedtime.      Allergies  Allergen Reactions  . Other Anaphylaxis    Chemo drugs or ivs   Follow-up Information    [facility] at [facility].            The results of significant diagnostics from this hospitalization (including imaging, microbiology, ancillary and laboratory) are listed below for reference.    Significant Diagnostic Studies: Dg Chest Port 1 View  Result Date: 08/03/2018 CLINICAL DATA:  Lung cancer. Shortness of breath. Discontinued chemotherapy 1 month ago. EXAM: PORTABLE CHEST  1 VIEW COMPARISON:  05/19/2018 FINDINGS: Although the right basilar airspace opacity has cleared, there has been rapid growth of the left upper thoracic pleural-based tumor, with increase lobularity and considerable increase in size from 05/19/2018. Lytic absence of portions of the fourth and fifth ribs. The mass is currently about 16.2 by 5.3 cm, previously 7.0 by 3.7 cm. Atherosclerotic calcification of the aortic arch. There is evidence of chronic rotator cuff tear on the right with prominent degenerative glenohumeral arthropathy. Nodularity at the right lung apex compatible with enlarging right apical tumor, currently 2.7 by 2.2 cm. New retrocardiac density, possibly a new left lower lobe nodule, 2.8 by 2.0 cm. IMPRESSION: 1. Prominent enlargement of the pleural-based/chest wall mass along the left upper chest with lytic destruction of the left fourth and part of the fifth rib. Rapid growth since 05/19/2018. 2. Enlarging right apical pulmonary nodule compatible with malignancy. 3. New retrocardiac nodularity measuring 2.8 by 2.0 cm, possibly malignancy or focal pneumonia. 4.  Aortic Atherosclerosis (ICD10-I70.0). Electronically Signed   By: Van Clines M.D.   On: 08/03/2018 18:52    Microbiology: Recent Results (from the past 240 hour(s))  SARS Coronavirus 2 (CEPHEID - Performed in [facility] hospital lab), Hosp Order     Status: None   Collection Time: 08/03/18  6:33 PM  Result Value Ref Range Status   SARS Coronavirus 2 NEGATIVE NEGATIVE Final    Comment: (NOTE) If result is NEGATIVE SARS-CoV-2 target nucleic acids are NOT DETECTED. The SARS-CoV-2 RNA is generally detectable in upper and lower  respiratory specimens during the acute phase of infection. The lowest  concentration of SARS-CoV-2 viral copies this assay can detect is 250  copies / mL. A negative result does not preclude SARS-CoV-2 infection  and should not be used as the sole basis for treatment or other  patient management  decisions.  A negative result may occur with  improper specimen collection / handling, submission of specimen other  than nasopharyngeal swab, presence of viral mutation(s) within the  areas targeted by this assay, and inadequate number of viral copies  (<250 copies / mL). A negative  result must be combined with clinical  observations, patient history, and epidemiological information. If result is POSITIVE SARS-CoV-2 target nucleic acids are DETECTED. The SARS-CoV-2 RNA is generally detectable in upper and lower  respiratory specimens dur ing the acute phase of infection.  Positive  results are indicative of active infection with SARS-CoV-2.  Clinical  correlation with patient history and other diagnostic information is  necessary to determine patient infection status.  Positive results do  not rule out bacterial infection or co-infection with other viruses. If result is PRESUMPTIVE POSTIVE SARS-CoV-2 nucleic acids MAY BE PRESENT.   A presumptive positive result was obtained on the submitted specimen  and confirmed on repeat testing.  While 2019 novel coronavirus  (SARS-CoV-2) nucleic acids may be present in the submitted sample  additional confirmatory testing may be necessary for epidemiological  and / or clinical management purposes  to differentiate between  SARS-CoV-2 and other Sarbecovirus currently known to infect humans.  If clinically indicated additional testing with an alternate test  methodology [phone]) is advised. The SARS-CoV-2 RNA is generally  detectable in upper and lower respiratory sp ecimens during the acute  phase of infection. The expected result is Negative. Fact Sheet for Patients:  StrictlyIdeas.no Fact Sheet for Healthcare Providers: BankingDealers.co.za This test is not yet approved or cleared by the [place] FDA and has been authorized for detection and/or diagnosis of SARS-CoV-2 by FDA under an  Emergency Use Authorization (EUA).  This EUA will remain in effect (meaning this test can be used) for the duration of the COVID-19 declaration under Section 564(b)(1) of the Act, 21 U.S.C. section 360bbb-3(b)(1), unless the authorization is terminated or revoked sooner. Performed at [facility], [place] [address] [place], [place] [address]   Blood culture (routine x 2)     Status: None (Preliminary result)   Collection Time: 08/03/18  6:43 PM  Result Value Ref Range Status   Specimen Description   Final    BLOOD LEFT ANTECUBITAL Performed at [facility] [address], [place], [place] [address]    Special Requests   Final    BOTTLES DRAWN AEROBIC AND ANAEROBIC Blood Culture adequate volume Performed at [facility] [address], [place], [place] [address]    Culture   Final    NO GROWTH 4 DAYS Performed at [facility], [place] [address] [place], [place] [address]    Report Status PENDING  Incomplete  Blood culture (routine x 2)     Status: None (Preliminary result)   Collection Time: 08/03/18  6:52 PM  Result Value Ref Range Status   Specimen Description   Final    BLOOD BLOOD LEFT FOREARM Performed at [facility] [address], [place], [place] [address]    Special Requests   Final    BOTTLES DRAWN AEROBIC ONLY Blood Culture results may not be optimal due to an inadequate volume of blood received in culture bottles Performed at [facility] [address] [place], [place] [address]    Culture   Final    NO GROWTH 4 DAYS Performed at [facility], [place] [address], [place], [place] [address]    Report Status PENDING  Incomplete  Respiratory Panel by PCR     Status: None   Collection Time: 08/03/18 10:13 PM  Result Value Ref Range Status   Adenovirus NOT DETECTED NOT DETECTED Final   Coronavirus 229E NOT DETECTED NOT DETECTED Final  Comment: (NOTE) The  Coronavirus on the Respiratory Panel, DOES NOT test for the novel  Coronavirus (2019 nCoV)    Coronavirus HKU1 NOT DETECTED NOT DETECTED Final   Coronavirus NL63 NOT DETECTED NOT DETECTED Final   Coronavirus OC43 NOT DETECTED NOT DETECTED Final   Metapneumovirus NOT DETECTED NOT DETECTED Final   Rhinovirus / Enterovirus NOT DETECTED NOT DETECTED Final   Influenza A NOT DETECTED NOT DETECTED Final   Influenza B NOT DETECTED NOT DETECTED Final   Parainfluenza Virus 1 NOT DETECTED NOT DETECTED Final   Parainfluenza Virus 2 NOT DETECTED NOT DETECTED Final   Parainfluenza Virus 3 NOT DETECTED NOT DETECTED Final   Parainfluenza Virus 4 NOT DETECTED NOT DETECTED Final   Respiratory Syncytial Virus NOT DETECTED NOT DETECTED Final   Bordetella pertussis NOT DETECTED NOT DETECTED Final   Chlamydophila pneumoniae NOT DETECTED NOT DETECTED Final   Mycoplasma pneumoniae NOT DETECTED NOT DETECTED Final    Comment: Performed at [facility], [place] [address], [place], [place] [address]     Labs: Basic Metabolic Panel: Recent Labs  Lab 08/04/18 0414 08/05/18 0543 08/06/18 0552 08/07/18 0540 08/08/18 0432  NA 137 133* 134* 134* 136  K 3.6 3.7 4.1 4.5 3.6  CL 101 102 103 102 104  CO2 26 24 25 24 23   GLUCOSE 383* 316* 204* 284* 239*  BUN 20 26* 27* 26* 25*  CREATININE 0.91 0.92 0.94 0.99 0.93  CALCIUM 7.8* 7.4* 7.5* 7.7* 7.7*  MG 1.7 2.2  --   --   --    Liver Function Tests: Recent Labs  Lab 08/03/18 1842  AST 12*  ALT 11  ALKPHOS 118  BILITOT 0.7  PROT 5.6*  ALBUMIN 2.2*   No results for input(s): LIPASE, AMYLASE in the last 168 hours. No results for input(s): AMMONIA in the last 168 hours. CBC: Recent Labs  Lab 08/04/18 1003 08/05/18 0543 08/05/18 1519 08/06/18 0552 08/07/18 0540 08/08/18 0432  WBC 18.7* 14.4*  --  15.1* 17.7* 20.0*  NEUTROABS 17.9* 13.6*  --  14.2* 16.8* 18.5*  HGB 9.2* 7.5* 7.5* 7.8* 8.6* 8.8*  HCT 29.5* 24.5* 25.2* 26.2* 28.2* 30.2*  MCV  105.7* 105.2*  --  106.9* 106.0* 108.2*  PLT 192 157  --  149* 155 166   Cardiac Enzymes: Recent Labs  Lab 08/03/18 1842 08/03/18 2319 08/04/18 0414 08/04/18 1003  TROPONINI 0.03* <0.03 <0.03 <0.03   BNP: BNP (last 3 results) Recent Labs    08/03/18 1842  BNP 259.9*    ProBNP (last 3 results) No results for input(s): PROBNP in the last 8760 hours.  CBG: Recent Labs  Lab 08/07/18 2120 08/08/18 0727 08/08/18 1150 08/08/18 1229 08/08/18 1259  GLUCAP 325* 148* 48* 60* 70       Signed:  Irine Seal MD.  [facility] 08/08/2018, 1:25 PM

## 2018-08-08 NOTE — Progress Notes (Signed)
Full report given to Sharyn Lull RN at [facility] - all questions answered.  Belva Crome also called and made aware that patient is on his way there.

## 2018-08-08 NOTE — Progress Notes (Signed)
Patient woke up around 0430 with significant pain in the left lower extremity. Patient states his entire leg is numb and is extremely painful, 10/10 pain. Ordered pain medication given. At [phone] and [phone]. This medication did not relieve symptoms, X. Blount paged at 0500. New orders for pain medication received and given at 0602, but patient is still with extreme pain and yelling out stating, "I cannot do this anymore." X. Blount paged again regarding patient's significant pain. Carnella [place] I

## 2018-08-08 NOTE — Progress Notes (Signed)
[id number] [facility] RN Note  Paper work completed. Notified Nancy Marus, RNCM. She will fax discharge summary to [facility] at [phone] and arrange for [facility]. Left message for bedside RN, Jinny Blossom.  Please call report to [phone].  Please arrange transportation for patient to arrive as early in day as possible.  Thank you, Margaretmary Eddy, RN, BSN [facility] Liaison [phone]  [place] are on AMION under Hospice and [place]

## 2018-08-08 NOTE — Progress Notes (Signed)
Hypoglycemic Event  CBG: 48  Treatment: 1 tube glucose gel  Symptoms: None  Follow-up CBG: Time:1300 CBG Result:70  Possible Reasons for Event: Inadequate meal intake  Comments/MD notified:MD paged and made aware    Clarice Pole

## 2018-08-08 NOTE — TOC Transition Note (Signed)
Transition of Care [facility]) - CM/SW Discharge Note   Patient Details  Name: Martin [name] MRN: [id number] Date of Birth: 02-Aug-1930  [facility] CM/SW Contact:  Joaquin Courts, RN Phone Number: 08/08/2018, 12:14 PM   Clinical Narrative: Cm received call from [facility] rep.  [facility] has a bed [facility] today.  Cm arranged transport.       Final next level of care: [place] Barriers to Discharge: No Barriers Identified   Patient Goals and CMS Choice   CMS Medicare.gov Compare Post Acute Care list provided to:: Patient Represenative (must comment) Choice offered to / list presented to : Adult Children  Discharge Placement                       Discharge Plan and Services                                     Social Determinants of Health (SDOH) Interventions     Readmission Risk Interventions Readmission Risk Prevention Plan 05/20/2018  Transportation Screening Complete  PCP or Specialist Appt within 3-5 Days (No Data)  [place] or [place] (No Data)  Social Work Consult for [place] Planning/Counseling [place] Screening Complete  Medication Review [employer]) Complete  Some recent data might be hidden

## 2018-08-08 NOTE — Progress Notes (Signed)
Patient discharging to [facility].  IVs removed - WNL.  [facility] here for transport.  Report called to Sharyn Lull - she is getting another patient in at this moment but will call me back for report.  Patient in NAD at this time, packet sent with [facility],

## 2018-08-10 LAB — GLUCOSE, CAPILLARY
Glucose-Capillary: 267 mg/dL — ABNORMAL HIGH (ref 70–99)
Glucose-Capillary: 116 mg/dL — ABNORMAL HIGH (ref 70–99)
Glucose-Capillary: 247 mg/dL — ABNORMAL HIGH (ref 70–99)

## 2018-08-12 ENCOUNTER — Telehealth: Payer: Self-pay | Admitting: Medical Oncology

## 2018-08-12 NOTE — Telephone Encounter (Signed)
Pt died June 2 at [facility]. Appts cancelled.

## 2018-08-13 ENCOUNTER — Other Ambulatory Visit: Payer: Medicare Other

## 2018-08-13 ENCOUNTER — Ambulatory Visit (HOSPITAL_COMMUNITY): Admission: RE | Admit: 2018-08-13 | Payer: Medicare Other | Source: Ambulatory Visit

## 2018-08-17 ENCOUNTER — Ambulatory Visit: Payer: Medicare Other | Admitting: Internal Medicine

## 2018-09-08 DEATH — deceased

## 2019-02-16 ENCOUNTER — Encounter: Payer: Self-pay | Admitting: *Deleted

## 2020-06-06 IMAGING — PT NM PET TUM IMG INITIAL (PI) SKULL BASE T - THIGH
1 of 8 series · 1 of 25 positions shown · non-contrast
Comparison: Multiple exams, including CT chest from 03/16/2018

CLINICAL DATA: Initial treatment strategy for lung cancer.

EXAM:
NUCLEAR MEDICINE PET SKULL BASE TO THIGH
TECHNIQUE: 7.2 mCi F-18 FDG was injected intravenously. Full-ring PET imaging
was performed from the skull base to thigh after the radiotracer. CT
data was obtained and used for attenuation correction and anatomic
localization.
Fasting blood glucose: 159 mg/dl

[Series 4: ct sk_thigh 5.0 b31f · axial · 5.0mm · 0.98mm/px · 1 of 205 slices shown]
[im 205/205  brain]
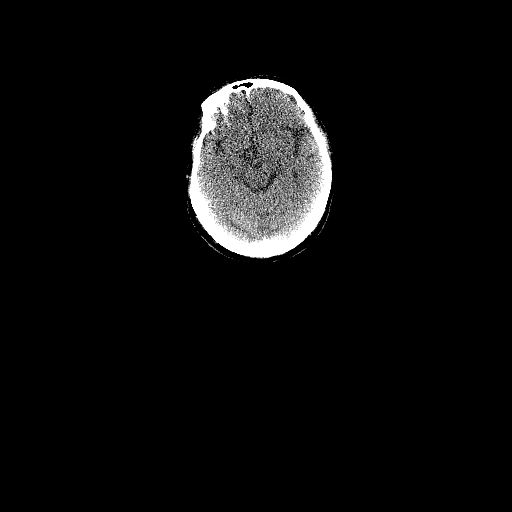

[1 of 25 positions shown; findings below may reference images not displayed]

FINDINGS: Mediastinal blood pool activity: SUV max

NECK: Expected photopenia associated with the right inferior medial
cerebellar encephalomalacia.

Incidental CT findings: Bilateral common carotid atherosclerotic
calcification.

CHEST: Irregular right apical mass, about 4.9 by 1.2 cm, maximum SUV
6.5 along the upper pleural margin.

Destructive lesion of the left fourth rib with associated pleural
mass, approximately 5.1 by 2.9 cm, maximum SUV 12.8. Adjacent
hypermetabolic pleural nodularity in the fourth intercostal region
posteriorly, maximum SUV 6.6.

Incidental CT findings: Coronary, aortic arch, and branch vessel
atherosclerotic vascular disease. Trace left pleural effusion. Mild
airway plugging in the left lower lobe.

ABDOMEN/PELVIS: No significant abnormal hypermetabolic activity in
this region.

Incidental CT findings: Small gallstones are present. Aorta bi-iliac
graft. Sigmoid colon diverticulosis. Moderate prostatomegaly.

SKELETON: 4.8 by 3.7 cm lytic/destructive lesion of the right sacral
ala, maximum SUV 6.1, impingement on the right sacral plexus not
excluded. There is a small central lucency in the right fourth rib
without accentuated metabolic activity, significance uncertain.

Incidental CT findings: Degenerative glenohumeral arthropathy
bilaterally.
IMPRESSION: 1. Right apical mass measures 4.9 by 1.2 cm and extends to the
pleural surface, maximum SUV 6.5, concerning for Pancoast tumor.
2. Destructive lesion of the left fourth rib, maximum SUV 12.8, with
extensive soft tissue component. There is also hypermetabolic
nodularity along the left pleura posterior to this lesion, and a
trace left pleural effusion.
3. 4.8 by 3.7 cm lytic lesion of the right sacral ala, maximum SUV
6.1, favoring metastatic disease. A possibility of myeloma is not
completely excluded, and serologic tests for myeloma would be
prudent.
4. Other imaging findings of potential clinical significance: Aortic
Atherosclerosis ([id number]). Coronary atherosclerosis. Mild airway
plugging in the left lower lobe. Cholelithiasis. Aorta bi-iliac
graft. Sigmoid colon diverticulosis. Moderate cardiomegaly.
Degenerative glenohumeral arthropathy bilaterally. Old right
inferior cerebellar stroke.
